# Patient Record
Sex: Female | Born: 1935 | Race: Black or African American | Hispanic: No | Smoking: Never smoker
Health system: Southern US, Community
[De-identification: ages and names within clinical notes are randomized; demographics above are authoritative.]

## PROBLEM LIST (undated history)

## (undated) DIAGNOSIS — M1990 Unspecified osteoarthritis, unspecified site: Secondary | ICD-10-CM

## (undated) DIAGNOSIS — M1611 Unilateral primary osteoarthritis, right hip: Secondary | ICD-10-CM

## (undated) DIAGNOSIS — K219 Gastro-esophageal reflux disease without esophagitis: Secondary | ICD-10-CM

## (undated) DIAGNOSIS — M1612 Unilateral primary osteoarthritis, left hip: Secondary | ICD-10-CM

## (undated) DIAGNOSIS — I2510 Atherosclerotic heart disease of native coronary artery without angina pectoris: Secondary | ICD-10-CM

## (undated) DIAGNOSIS — R351 Nocturia: Secondary | ICD-10-CM

## (undated) DIAGNOSIS — J40 Bronchitis, not specified as acute or chronic: Secondary | ICD-10-CM

## (undated) DIAGNOSIS — I6529 Occlusion and stenosis of unspecified carotid artery: Secondary | ICD-10-CM

## (undated) DIAGNOSIS — E039 Hypothyroidism, unspecified: Secondary | ICD-10-CM

## (undated) DIAGNOSIS — R058 Other specified cough: Secondary | ICD-10-CM

## (undated) DIAGNOSIS — R05 Cough: Secondary | ICD-10-CM

## (undated) DIAGNOSIS — I219 Acute myocardial infarction, unspecified: Secondary | ICD-10-CM

## (undated) DIAGNOSIS — M25473 Effusion, unspecified ankle: Secondary | ICD-10-CM

## (undated) DIAGNOSIS — Z9289 Personal history of other medical treatment: Secondary | ICD-10-CM

## (undated) DIAGNOSIS — M169 Osteoarthritis of hip, unspecified: Secondary | ICD-10-CM

## (undated) DIAGNOSIS — E7800 Pure hypercholesterolemia, unspecified: Secondary | ICD-10-CM

## (undated) DIAGNOSIS — K9189 Other postprocedural complications and disorders of digestive system: Secondary | ICD-10-CM

## (undated) DIAGNOSIS — R51 Headache: Secondary | ICD-10-CM

## (undated) DIAGNOSIS — K567 Ileus, unspecified: Secondary | ICD-10-CM

## (undated) DIAGNOSIS — I10 Essential (primary) hypertension: Secondary | ICD-10-CM

## (undated) HISTORY — PX: CORONARY ARTERY BYPASS GRAFT: SHX141

## (undated) HISTORY — PX: ABDOMINAL HYSTERECTOMY: SHX81

## (undated) HISTORY — PX: LYMPH NODE DISSECTION: SHX5087

## (undated) HISTORY — DX: Occlusion and stenosis of unspecified carotid artery: I65.29

## (undated) HISTORY — DX: Personal history of other medical treatment: Z92.89

## (undated) HISTORY — DX: Pure hypercholesterolemia, unspecified: E78.00

## (undated) HISTORY — DX: Atherosclerotic heart disease of native coronary artery without angina pectoris: I25.10

## (undated) HISTORY — DX: Essential (primary) hypertension: I10

## (undated) HISTORY — DX: Gastro-esophageal reflux disease without esophagitis: K21.9

## (undated) HISTORY — PX: BACK SURGERY: SHX140

## (undated) HISTORY — DX: Osteoarthritis of hip, unspecified: M16.9

---

## 2006-10-06 HISTORY — PX: CARDIAC CATHETERIZATION: SHX172

## 2006-10-19 ENCOUNTER — Encounter: Payer: Self-pay | Admitting: Internal Medicine

## 2006-10-19 ENCOUNTER — Inpatient Hospital Stay (HOSPITAL_COMMUNITY): Admission: EM | Admit: 2006-10-19 | Discharge: 2006-10-22 | Payer: Self-pay | Admitting: Emergency Medicine

## 2006-10-19 ENCOUNTER — Ambulatory Visit: Payer: Self-pay | Admitting: Internal Medicine

## 2006-11-24 ENCOUNTER — Encounter: Payer: Self-pay | Admitting: Cardiology

## 2006-11-24 ENCOUNTER — Ambulatory Visit: Payer: Self-pay

## 2006-11-26 ENCOUNTER — Ambulatory Visit: Payer: Self-pay | Admitting: Cardiology

## 2006-12-17 ENCOUNTER — Ambulatory Visit: Payer: Self-pay | Admitting: Cardiology

## 2006-12-30 ENCOUNTER — Ambulatory Visit: Payer: Self-pay

## 2007-03-25 ENCOUNTER — Ambulatory Visit: Payer: Self-pay | Admitting: Cardiology

## 2007-07-01 ENCOUNTER — Ambulatory Visit: Payer: Self-pay | Admitting: Cardiology

## 2007-12-10 ENCOUNTER — Ambulatory Visit: Payer: Self-pay | Admitting: Cardiology

## 2008-07-17 ENCOUNTER — Ambulatory Visit: Payer: Self-pay | Admitting: Cardiology

## 2008-07-17 LAB — CONVERTED CEMR LAB
ALT: 16 units/L (ref 0–35)
AST: 17 units/L (ref 0–37)
Albumin: 3.6 g/dL (ref 3.5–5.2)
Alkaline Phosphatase: 68 units/L (ref 39–117)
Bilirubin, Direct: 0.1 mg/dL (ref 0.0–0.3)
Cholesterol: 145 mg/dL (ref 0–200)
HDL: 43.9 mg/dL (ref >39.0)
LDL Cholesterol: 87 mg/dL (ref 0–99)
Total Bilirubin: 0.6 mg/dL (ref 0.3–1.2)
Total CHOL/HDL Ratio: 3.3
VLDL: 15 mg/dL (ref 0–40)

## 2008-08-03 ENCOUNTER — Ambulatory Visit: Payer: Self-pay

## 2008-08-03 ENCOUNTER — Encounter: Payer: Self-pay | Admitting: Cardiology

## 2008-09-17 DIAGNOSIS — I25118 Atherosclerotic heart disease of native coronary artery with other forms of angina pectoris: Secondary | ICD-10-CM

## 2008-09-17 DIAGNOSIS — I6529 Occlusion and stenosis of unspecified carotid artery: Secondary | ICD-10-CM | POA: Insufficient documentation

## 2008-09-17 DIAGNOSIS — E785 Hyperlipidemia, unspecified: Secondary | ICD-10-CM | POA: Insufficient documentation

## 2008-09-17 DIAGNOSIS — I10 Essential (primary) hypertension: Secondary | ICD-10-CM | POA: Insufficient documentation

## 2008-09-17 DIAGNOSIS — I2510 Atherosclerotic heart disease of native coronary artery without angina pectoris: Secondary | ICD-10-CM | POA: Insufficient documentation

## 2008-11-21 ENCOUNTER — Ambulatory Visit: Payer: Self-pay | Admitting: Cardiology

## 2008-11-21 LAB — CONVERTED CEMR LAB
AST: 19 units/L (ref 0–37)
Alkaline Phosphatase: 69 units/L (ref 39–117)
Bilirubin, Direct: 0.1 mg/dL (ref 0.0–0.3)
LDL Cholesterol: 53 mg/dL (ref 0–99)
Total Bilirubin: 0.6 mg/dL (ref 0.3–1.2)
Triglycerides: 89 mg/dL (ref 0–149)
VLDL: 18 mg/dL (ref 0–40)

## 2009-10-10 ENCOUNTER — Telehealth: Payer: Self-pay | Admitting: Cardiology

## 2009-10-15 ENCOUNTER — Encounter (INDEPENDENT_AMBULATORY_CARE_PROVIDER_SITE_OTHER): Payer: Self-pay | Admitting: *Deleted

## 2009-11-05 ENCOUNTER — Encounter: Payer: Self-pay | Admitting: Cardiology

## 2009-11-29 ENCOUNTER — Ambulatory Visit: Payer: Self-pay | Admitting: Cardiology

## 2009-11-29 DIAGNOSIS — R0602 Shortness of breath: Secondary | ICD-10-CM | POA: Insufficient documentation

## 2009-11-29 LAB — CONVERTED CEMR LAB
ALT: 27 units/L (ref 0–35)
AST: 24 units/L (ref 0–37)
Albumin: 3.8 g/dL (ref 3.5–5.2)
BUN: 11 mg/dL (ref 6–23)
Chloride: 109 meq/L (ref 96–112)
Cholesterol: 171 mg/dL (ref 0–200)
HDL: 57.4 mg/dL (ref >39.00)
LDL Cholesterol: 96 mg/dL (ref 0–99)
Total Bilirubin: 0.5 mg/dL (ref 0.3–1.2)

## 2009-12-17 ENCOUNTER — Telehealth (INDEPENDENT_AMBULATORY_CARE_PROVIDER_SITE_OTHER): Payer: Self-pay | Admitting: *Deleted

## 2009-12-18 ENCOUNTER — Ambulatory Visit: Payer: Self-pay | Admitting: Cardiology

## 2009-12-18 ENCOUNTER — Ambulatory Visit: Payer: Self-pay

## 2009-12-18 ENCOUNTER — Encounter (HOSPITAL_COMMUNITY): Admission: RE | Admit: 2009-12-18 | Discharge: 2010-02-06 | Payer: Self-pay | Admitting: Cardiology

## 2009-12-18 ENCOUNTER — Encounter: Payer: Self-pay | Admitting: Cardiology

## 2009-12-18 ENCOUNTER — Ambulatory Visit (HOSPITAL_COMMUNITY): Admission: RE | Admit: 2009-12-18 | Discharge: 2009-12-18 | Payer: Self-pay | Admitting: Cardiology

## 2009-12-24 ENCOUNTER — Ambulatory Visit: Payer: Self-pay | Admitting: Cardiology

## 2009-12-24 DIAGNOSIS — I5032 Chronic diastolic (congestive) heart failure: Secondary | ICD-10-CM | POA: Insufficient documentation

## 2009-12-24 LAB — CONVERTED CEMR LAB
BUN: 11 mg/dL (ref 6–23)
GFR calc non Af Amer: 105.15 mL/min (ref >60)
Glucose, Bld: 122 mg/dL — ABNORMAL HIGH (ref 70–99)
Sodium: 141 meq/L (ref 135–145)

## 2009-12-25 LAB — CONVERTED CEMR LAB
CO2: 29 meq/L (ref 19–32)
Calcium: 8.9 mg/dL (ref 8.4–10.5)
GFR calc non Af Amer: 90.13 mL/min (ref >60)
Glucose, Bld: 111 mg/dL — ABNORMAL HIGH (ref 70–99)
Sodium: 142 meq/L (ref 135–145)

## 2010-01-24 ENCOUNTER — Ambulatory Visit: Payer: Self-pay | Admitting: Cardiology

## 2010-01-29 LAB — CONVERTED CEMR LAB
ALT: 14 units/L (ref 0–35)
AST: 18 units/L (ref 0–37)
Albumin: 3.3 g/dL — ABNORMAL LOW (ref 3.5–5.2)
Alkaline Phosphatase: 65 units/L (ref 39–117)
Bilirubin, Direct: 0 mg/dL (ref 0.0–0.3)
HDL: 42.1 mg/dL (ref >39.00)
Total Bilirubin: 0 mg/dL — ABNORMAL LOW (ref 0.3–1.2)
VLDL: 20.8 mg/dL (ref 0.0–40.0)

## 2010-03-25 ENCOUNTER — Ambulatory Visit: Payer: Self-pay | Admitting: Cardiology

## 2010-03-29 LAB — CONVERTED CEMR LAB
BUN: 13 mg/dL (ref 6–23)
CO2: 30 meq/L (ref 19–32)
Calcium: 9.1 mg/dL (ref 8.4–10.5)
Chloride: 109 meq/L (ref 96–112)
Creatinine, Ser: 0.7 mg/dL (ref 0.4–1.2)
Potassium: 4.5 meq/L (ref 3.5–5.1)
Sodium: 143 meq/L (ref 135–145)

## 2010-07-05 ENCOUNTER — Telehealth: Payer: Self-pay | Admitting: Cardiology

## 2010-07-05 ENCOUNTER — Encounter: Payer: Self-pay | Admitting: Cardiology

## 2010-07-29 ENCOUNTER — Ambulatory Visit: Payer: Self-pay | Admitting: Cardiology

## 2010-07-31 ENCOUNTER — Encounter: Payer: Self-pay | Admitting: Cardiology

## 2010-07-31 LAB — CONVERTED CEMR LAB
AST: 22 units/L (ref 0–37)
Alkaline Phosphatase: 84 units/L (ref 39–117)
BUN: 14 mg/dL (ref 6–23)
Calcium: 9.2 mg/dL (ref 8.4–10.5)
HDL: 40.7 mg/dL (ref >39.00)
Sodium: 136 meq/L (ref 135–145)
Total CHOL/HDL Ratio: 3
Total Protein: 7.6 g/dL (ref 6.0–8.3)

## 2010-11-05 NOTE — Progress Notes (Signed)
Summary: pt needs note faxed to social services   Phone Note Call from Patient Call back at [phone]   Caller: Patient Reason for Call: Talk to Nurse, Talk to Doctor Summary of Call: pt needs a note faxed to [facility]  the social services office telling them she has an appt on Monday to see provider fax# is [phone]  Follow-up for Phone Call        NOTE WAS FAXED AT PT'S REQUEST  TO NAMED INDIVIDUAL IN MESSAGE. Follow-up by: Scherrie Bateman, LPN,  July 05, 2010 2:03 PM

## 2010-11-05 NOTE — Assessment & Plan Note (Signed)
Summary: f/u echo and myoview done 12-18-09  Medications Added NORVASC 10 MG TABS (AMLODIPINE BESYLATE) one tablet daily TYLENOL ARTHRITIS PAIN 650 MG CR-TABS (ACETAMINOPHEN) one tablet every 8 hours as needed for pain TRAMADOL HCL 50 MG TABS (TRAMADOL HCL) one tablet twice a day as needed for pain      Allergies Added: NKDA  Primary Provider:  Dr. Illene Regulus  CC:  follow up echo and myoview.  Pt states she is feeling well.  History of Present Illness: 77 yo with history of CAD s/p PCI returns for cardiology followup today.  She initially had depressed LV EF after her MI but this rose back to normal range on last echo in 2009.  At last appointment, she reported exertional dyspnea that seems to have been fairly stable for the last year.  She was able to walk a little over 100 yards then had to stop because of shortness of breath.  No orthopnea or PND.  She had an episode about 2 months ago of severe chest pain for about 10-15 minutes that began while she was mopping her house.  She has not had any significant chest pain before or since that time.  She looked volume overloaded at last appointment.  I increased her lasix to 40 mg daily.  She had an echo done showing preserved LV systolic function and mild diastolic dysfunction.  ETT-myoview was done with a hypertensive BP response on the treadmill but no evidence for ischemia or infarction on images.  Since increasing Lasix, she has been breathing better.  She can walk up to 1/2 mile without stopping and does not have orthopnea.  No chest pain.  Labs (2/10): LDL 53, HDL 44 Labs (2/11): BNP 261, creatinine 0.7, K 3.9, LDL 96, HDL 57 Labs (3/11): BNP 48, K 3.7, creatinine 0.8  Current Medications (verified): 1)  Plavix 75 Mg Tabs (Clopidogrel Bisulfate) .... Take One Tablet By Mouth Daily 2)  Hydrochlorothiazide 25 Mg Tabs (Hydrochlorothiazide) .... Take One Tablet Once Daily 3)  Levoxyl 100 Mcg Tabs (Levothyroxine Sodium) .... Take One Tablet  Once Daily 4)  Coreg 25 Mg Tabs (Carvedilol) .... Take One Tablet Two Times A Day 5)  Enalapril Maleate 20 Mg Tabs (Enalapril Maleate) .... Take One Tablet Two Times A Day 6)  Crestor 40 Mg Tabs (Rosuvastatin Calcium) .... One Tablet Daily 7)  Klor-Con 8 Meq Cr-Tabs (Potassium Chloride) .... Take One Tablet Twice Daily 8)  Aspirin 81 Mg Tabs (Aspirin) .... Once Daily 9)  Furosemide 40 Mg Tabs (Furosemide) .... One Tablet Daily 10)  Norvasc 5 Mg Tabs (Amlodipine Besylate) .... One Tablet Daily 11)  Naprosyn 250 Mg Tabs (Naproxen) .... As Needed For Pain  Allergies (verified): No Known Drug Allergies  Past History:  Past Medical History: 1. Coronary artery disease.  The patient had an acute anterior MI in March 2008.  She had 90% LAD stenosis with thrombus.  She did have 2.75 x 12 Taxus drug-eluting stent placed in the proximal LAD.  She did also have 80% first obtuse marginal stenosis, 70% distal circumflex stenosis and 50% proximal RCA stenosis.  Her EF was initially 30-35% at the time of her MI; however, it rose after primary PCI to 60%.  ETT-myoview (3/11): EF 68%, hypertensive BP response, normal perfusion at rest and stress (negative study).  2. Hypertension. 3. Hypercholesterolemia. 4. Gastroesophageal reflux disease. 5. Mild carotid stenosis less than 50% bilaterally.  6. Echo (3/11): EF 60-65%, mild diastolic dysfunction, mild MR, normal RV.  Family History: Reviewed history from 09/17/2008 and no changes required. Noncontributory.  Social History: Reviewed history from 09/17/2008 and no changes required. The patient lives in [place].  She does not smoke or drink alcohol.   Review of Systems       All systems reviewed and negative except as per HPI.   Vital Signs:  Patient profile:   77 year old female Height:      59 inches Weight:      149 pounds BMI:     30.20 Pulse rate:   53 / minute Pulse rhythm:   regular BP sitting:   148 / 70  (left arm) Cuff size:    regular  Vitals Entered By: Judithe Modest CMA (December 24, 2009 10:06 AM)  Physical Exam  General:  Well developed, well nourished, in no acute distress. Neck:  Neck supple, no JVD. No masses, thyromegaly or abnormal cervical nodes. Lungs:  Clear bilaterally to auscultation and percussion. Heart:  Non-displaced PMI, chest non-tender; regular rate and rhythm, S1, S2 without rubs or gallops. Soft 1/6 systolic ejection-type murmur.  Carotid upstroke normal, no bruit. Pedals normal pulses. No edema.  Abdomen:  Bowel sounds positive; abdomen soft and non-tender without masses, organomegaly, or hernias noted. No hepatosplenomegaly. Extremities:  No clubbing or cyanosis. Neurologic:  Alert and oriented x 3. Psych:  Normal affect.   Impression & Recommendations:  Problem # 1:  SHORTNESS OF BREATH (ICD-786.05) Patient is feeling better on increased Lasix and now appears euvolemic.  BNP down from 261 to 48.  Echo showed preserved LV systolic function (diastolic CHF).  She is able to walk up to 1/2 mile now without stopping.  I will have her continue Lasix 40 mg daily and have counselled her about cutting back on the salt in her diet.   Problem # 2:  CAD, NATIVE VESSEL (ICD-414.01) Myoview showed no evidence for ischemia or infarction.  Continue current meds including Coreg, ASA, Plavix, ACEI, and statin.   Problem # 3:  HYPERTENSION, UNSPECIFIED (ICD-401.9) I would like her to avoid Naprosyn if possible as it can both raise BP and lead to volume retention.  She should use Tylenol or tramadol instead.   Problem # 4:  HYPERLIPIDEMIA-MIXED (ICD-272.4) Patient was started on Crestor 40 with goal to get LDL < 70.  Will repeat lipids/LFTs in April.    Other Orders: TLB-BNP (B-Natriuretic Peptide) (83880-BNPR) TLB-BMP (Basic Metabolic Panel-BMET) (80048-METABOL)  Patient Instructions: 1)  Your physician has recommended you make the following change in your medication:  2)  Increase Amlodipine  to  10mg  daily 3)  Stop Naprosyn 4)  Use Tylenol 650mg  every 8 hours as needed for arthritis pain 5)  If Tylenol does not relieve your pain you can use Tramadol 50mg  twice a day for pain--there is a presciption for this at [facility]--DO NOT TAKE  Tylenol and Tramadol 6)  Your physician recommends that you have lab work today--BMP/BNP 428.32 7)  Your physician recommends that you return for a FASTING lipid profile/liver profile THE END OF APRIL 428.32 8)  Your physician recommends that you schedule a follow-up appointment in: 3 months with Dr Marca Ancona Prescriptions: TRAMADOL HCL 50 MG TABS (TRAMADOL HCL) one tablet twice a day as needed for pain  #60 x 0   Entered by:   Katina Dung, RN, BSN   Authorized by:   Marca Ancona, MD   Signed by:   Katina Dung, RN, BSN on 12/24/2009   Method used:  Electronically to        [facility] (retail)       [address]       [place], [place]  [address]       Ph: [phone]       Fax: [phone]   RxID:   [id number] NORVASC 10 MG TABS (AMLODIPINE BESYLATE) one tablet daily  #30 x 6   Entered by:   Katina Dung, RN, BSN   Authorized by:   Marca Ancona, MD   Signed by:   Katina Dung, RN, BSN on 12/24/2009   Method used:   Electronically to        [facility] (retail)       [address]       [place], [place]  [address]       Ph: [phone]       Fax: [phone]   RxID:   [id number]

## 2010-11-05 NOTE — Miscellaneous (Signed)
  Clinical Lists Changes  Observations: Added new observation of ECHOINTERP: --------------------------------------------------------------------   Study Conclusions   Left ventricle: The cavity size was normal. There was mild   concentric hypertrophy. Systolic function was mildly reduced. The   estimated ejection fraction was in the range of 45% to 50%. Diffuse   hypokinesis. Doppler parameters are consistent with abnormal left   ventricular relaxation (grade 1 diastolic dysfunction).   Echocardiography. M-mode, complete 2D, spectral Doppler, and color   Doppler. Height: Height: 160cm. Height: 63in. Weight: Weight:   174.6kg. Weight: 384.2lb. Body mass index: BMI: 68.2kg/m^2. Body   surface area: BSA: 2.24m^2. Patient status: Outpatient. Location:   [facility], [facility] and exercise.   Prepared and Electronically Authenticated by    Olga Millers, MD, [facility]   2010-08-16T15:40:05.290 (05/21/2009 10:35)      Echocardiogram  Procedure date:  05/21/2009  Findings:      --------------------------------------------------------------------   Study Conclusions   Left ventricle: The cavity size was normal. There was mild   concentric hypertrophy. Systolic function was mildly reduced. The   estimated ejection fraction was in the range of 45% to 50%. Diffuse   hypokinesis. Doppler parameters are consistent with abnormal left   ventricular relaxation (grade 1 diastolic dysfunction).   Echocardiography. M-mode, complete 2D, spectral Doppler, and color   Doppler. Height: Height: 160cm. Height: 63in. Weight: Weight:   174.6kg. Weight: 384.2lb. Body mass index: BMI: 68.2kg/m^2. Body   surface area: BSA: 2.36m^2. Patient status: Outpatient. Location:   Psychologist, sport and exercise.   Prepared and Electronically Authenticated by    Olga Millers, MD, [facility]   2010-08-16T15:40:05.290

## 2010-11-05 NOTE — Assessment & Plan Note (Signed)
Summary: f1y/jss  Medications Added HYDROCHLOROTHIAZIDE 25 MG TABS (HYDROCHLOROTHIAZIDE) take one tablet once daily LEVOXYL 100 MCG TABS (LEVOTHYROXINE SODIUM) take one tablet once daily COREG 25 MG TABS (CARVEDILOL) take one tablet two times a day ENALAPRIL MALEATE 20 MG TABS (ENALAPRIL MALEATE) take one tablet two times a day LIPITOR 80 MG TABS (ATORVASTATIN CALCIUM) take one tablet once daily KLOR-CON 8 MEQ CR-TABS (POTASSIUM CHLORIDE) take one tablet twice daily ASPIRIN 81 MG TABS (ASPIRIN) once daily FUROSEMIDE 40 MG TABS (FUROSEMIDE) one tablet daily NORVASC 5 MG TABS (AMLODIPINE BESYLATE) one tablet daily      Allergies Added: NKDA  Primary Provider:  Dr. Illene Regulus  CC:  follow up 1 year. Pt feeling SOB lately.  History of Present Illness: 77 yo with history of CAD s/p PCI returns for cardiology followup today.  She initially had depressed LV EF after her MI but this rose back to normal range on last echo in 2009.  She was lost to followup for the last year and a half.  She reports exertional dyspnea that seems to have been fairly stable for the last year.  She can walk a little over 100 yards then has to stop because of shortness of breath.  No orthopnea or PND.  She had an episode about 2 months ago of severe chest pain for about 10-15 minutes that began while she was mopping her house.  She had not had any significant chest pain before or since that time.  BP at home has been in the 140s-150s systolic.  Initial BP here was 136/88 but was 180/92 when I rechecked it manually.  She took all her meds today.    labs (2/10): LDL 53, HDL 44  Current Medications (verified): 1)  Plavix 75 Mg Tabs (Clopidogrel Bisulfate) .... Take One Tablet By Mouth Daily 2)  Hydrochlorothiazide 25 Mg Tabs (Hydrochlorothiazide) .... Take One Tablet Once Daily 3)  Levoxyl 100 Mcg Tabs (Levothyroxine Sodium) .... Take One Tablet Once Daily 4)  Coreg 25 Mg Tabs (Carvedilol) .... Take One Tablet Two Times A  Day 5)  Enalapril Maleate 20 Mg Tabs (Enalapril Maleate) .... Take One Tablet Two Times A Day 6)  Lipitor 80 Mg Tabs (Atorvastatin Calcium) .... Take One Tablet Once Daily 7)  Klor-Con 8 Meq Cr-Tabs (Potassium Chloride) .... Take One Tablet Twice Daily 8)  Aspirin 81 Mg Tabs (Aspirin) .... Once Daily 9)  Furosemide 40 Mg Tabs (Furosemide) .... One Tablet Daily 10)  Norvasc 5 Mg Tabs (Amlodipine Besylate) .... One Tablet Daily  Allergies (verified): No Known Drug Allergies  Past History:  Past Medical History: 1. Coronary artery disease.  The patient had an acute anterior MI in March 2008.  She had 90% LAD stenosis with thrombus.  She did have 2.75 x 12 Taxus drug-eluting stent placed in the proximal LAD.  She did also have 80% first obtuse marginal stenosis, 70% distal circumflex stenosis and 50% proximal RCA stenosis.  Her EF was initially 30-35% at the time of her MI; however, it rose after primary PCI to 60%. 2. Hypertension. 3. Hypercholesterolemia. 4. Gastroesophageal reflux disease. 5. Mild carotid stenosis less than 50% bilaterally.  6. Echo (10/09): EF 60-65%, no rWMAs, normal RV size/fxn.  Family History: Reviewed history from 09/17/2008 and no changes required. Noncontributory.  Social History: Reviewed history from 09/17/2008 and no changes required. The patient lives in [place].  She does not smoke or drink alcohol.   Review of Systems  All systems reviewed and negative except as per HPI.   Vital Signs:  Patient profile:   77 year old female Height:      59 inches Weight:      152 pounds BMI:     30.81 Pulse rate:   59 / minute Pulse rhythm:   regular BP sitting:   136 / 88  (left arm) Cuff size:   regular  Vitals Entered By: Judithe Modest CMA (November 29, 2009 9:29 AM)  Physical Exam  General:  Well developed, well nourished, in no acute distress. Neck:  Neck supple, JVP 9-10 cm. No masses, thyromegaly or abnormal cervical nodes. Lungs:   Clear bilaterally to auscultation and percussion. Heart:  Non-displaced PMI, chest non-tender; regular rate and rhythm, S1, S2 without rubs or gallops. Soft 1/6 systolic ejection-type murmur.  Carotid upstroke normal, no bruit. Pedals normal pulses. Trace ankle edema.   Abdomen:  Bowel sounds positive; abdomen soft and non-tender without masses, organomegaly, or hernias noted. No hepatosplenomegaly. Extremities:  No clubbing or cyanosis. Neurologic:  Alert and oriented x 3. Psych:  Normal affect.   Impression & Recommendations:  Problem # 1:  SHORTNESS OF BREATH (ICD-786.05) Patient has NYHA class IIIa symptoms.  She says they have been stable for at least a year.  EF was normal on last echo in 2009.  She is volume overloaded on exam with increased JVP.  I will repeat an echo to reassess LV systolic function and will check BNP.  She will continue Coreg and enalapril.   I will increase Lasix to 40 mg daily.    Problem # 2:  CAD, NATIVE VESSEL (ICD-414.01) Patient had an episode of severe exertional chest pain a couple of months ago but nothing since.  Given her exertional dyspnea and signs of CHF/volume overload on exam, I will have her do an ETT-myoview to assess for any evidence of progressive coronary disease.  She will continue ASA and Plavix as well as statin.  Check lipids/LFTs today.    Problem # 3:  HYPERTENSION, UNSPECIFIED (ICD-401.9) BP is too high.  Start Norvasc 5 mg daily today.  Will reassess when she follows up in 3-4 weeks.   Other Orders: EKG w/ Interpretation (93000) Echocardiogram (Echo) Nuclear Stress Test (Nuc Stress Test) TLB-BNP (B-Natriuretic Peptide) (83880-BNPR) TLB-BMP (Basic Metabolic Panel-BMET) (80048-METABOL) TLB-Lipid Panel (80061-LIPID) TLB-Hepatic/Liver Function Pnl (80076-HEPATIC)  Patient Instructions: 1)  Your physician has recommended you make the following change in your medication:  2)  Increase Lasix to 40mg  daily 3)  Increase KCL(potassium) to  twice a day 4)  Start Norvasc 5mg  daily 5)  Your physician recommends that you have FASTING lipid profile/liver profile/BMP/BNP today  411.01 786.50 6)  Take and record your blood pressure--bring the readings when you come back to see Dr Shirlee Latch in 2 weeks. 7)  Your physician has requested that you have an echocardiogram.  Echocardiography is a painless test that uses sound waves to create images of your heart. It provides your doctor with information about the size and shape of your heart and how well your heart's chambers and valves are working.  This procedure takes approximately one hour. There are no restrictions for this procedure. 8)  Your physician has requested that you have an exercise stress myoview.  For further information please visit [email or web address]  Please follow instruction sheet, as given. 9)  Your physician recommends that you schedule a follow-up appointment in: 2-3 weeks with Dr Shirlee Latch Prescriptions: NORVASC 5 MG TABS (AMLODIPINE BESYLATE)  one tablet daily  #30 x 6   Entered by:   Katina Dung, RN, BSN   Authorized by:   Marca Ancona, MD   Signed by:   Katina Dung, RN, BSN on 11/29/2009   Method used:   Electronically to        [facility] (retail)       [address]       [place], [place]  [address]       Ph: [phone]       Fax: [phone]   RxID:   [id number] KLOR-CON 8 MEQ CR-TABS (POTASSIUM CHLORIDE) take one tablet twice daily  #60 x 6   Entered by:   Katina Dung, RN, BSN   Authorized by:   Marca Ancona, MD   Signed by:   Katina Dung, RN, BSN on 11/29/2009   Method used:   Electronically to        [facility] (retail)       [address]       [place], [place]  [address]       Ph: [phone]       Fax: [phone]   RxID:   [id number] FUROSEMIDE 40 MG TABS (FUROSEMIDE) one tablet daily  #30 x 6   Entered by:   Katina Dung, RN, BSN   Authorized by:   Marca Ancona, MD   Signed by:   Katina Dung, RN, BSN on 11/29/2009   Method used:   Electronically to        [facility] (retail)       [address]       [place], [place]  [address]       Ph: [phone]       Fax: [phone]   RxID:   [id number]

## 2010-11-05 NOTE — Miscellaneous (Signed)
Summary: update med  Clinical Lists Changes  Medications: Added new medication of PLAVIX 75 MG TABS (CLOPIDOGREL BISULFATE) Take one tablet by mouth daily 

## 2010-11-05 NOTE — Assessment & Plan Note (Signed)
Summary: per check out/sf      Allergies Added: NKDA  Visit Type:  Follow-up Primary Provider:  Dr. Illene Regulus  CC:  Occasional shortness of breath.  History of Present Illness: 77 yo with history of CAD s/p PCI returns for cardiology followup today.  She initially had depressed LV EF after her MI but this rose back to normal range on last echo in 2009.  At a recent appointment, she reported exertional dyspnea that seems to have been fairly stable for the year prior.  She was able to walk a little over 100 yards then had to stop because of shortness of breath.  No orthopnea or PND.   She looked volume overloaded at that appointment, so I increased her lasix to 40 mg daily.  She had an echo done showing preserved LV systolic function and mild diastolic dysfunction.  ETT-myoview was done with a hypertensive BP response on the treadmill but no evidence for ischemia or infarction on images.  Since increasing Lasix, she has been breathing better.  She can walk up to 1/2 mile without stopping and does not have orthopnea.  No chest pain.  She can walk up a flight of steps.  BP is now under good control with SBP < 140 at home.  She is occasionally dizzy when she first stands up in the morning but not severely.  No syncope or falls.  Weight has risen some but she also says she has been eating more.    Labs (2/10): LDL 53, HDL 44 Labs (2/11): BNP 261, creatinine 0.7, K 3.9, LDL 96, HDL 57 Labs (3/11): BNP 48, K 3.7, creatinine 0.8 Labs (4/11): LDL 61, HDL 42, LFTs normal Labs (5/40): K 4.5, creatinine 0.7, BNP 40  ECG: NSR, abnormal P wave axis, nonspecific ST-T changes  Problems Prior to Update: 1)  Chronic Diastolic Heart Failure  (ICD-428.32) 2)  Shortness of Breath  (ICD-786.05) 3)  Carotid Artery Stenosis  (ICD-433.10) 4)  Hypertension, Unspecified  (ICD-401.9) 5)  Hyperlipidemia-mixed  (ICD-272.4) 6)  Cad, Native Vessel  (ICD-414.01)  Current Medications (verified): 1)  Plavix 75 Mg Tabs  (Clopidogrel Bisulfate) .... Take One Tablet By Mouth Daily 2)  Hydrochlorothiazide 25 Mg Tabs (Hydrochlorothiazide) .... Take One Tablet Once Daily 3)  Levoxyl 100 Mcg Tabs (Levothyroxine Sodium) .... Take One Tablet Once Daily 4)  Coreg 25 Mg Tabs (Carvedilol) .... Take One Tablet Two Times A Day 5)  Enalapril Maleate 20 Mg Tabs (Enalapril Maleate) .... Take One Tablet Two Times A Day 6)  Crestor 40 Mg Tabs (Rosuvastatin Calcium) .... One Tablet Daily 7)  Klor-Con 8 Meq Cr-Tabs (Potassium Chloride) .... Take One Tablet Twice Daily 8)  Aspirin 81 Mg Tabs (Aspirin) .... Once Daily 9)  Furosemide 40 Mg Tabs (Furosemide) .... One Tablet Daily 10)  Norvasc 10 Mg Tabs (Amlodipine Besylate) .... One Tablet Daily 11)  Tylenol Arthritis Pain 650 Mg Cr-Tabs (Acetaminophen) .... One Tablet Every 8 Hours As Needed For Pain 12)  Tramadol Hcl 50 Mg Tabs (Tramadol Hcl) .... One Tablet Twice A Day As Needed For Pain 13)  Naproxen 250 Mg Tabs (Naproxen) .... Take One Tablet Two Times A Day As Needed For Pain  Allergies (verified): No Known Drug Allergies  Past History:  Past Medical History: Reviewed history from 12/24/2009 and no changes required. 1. Coronary artery disease.  The patient had an acute anterior MI in March 2008.  She had 90% LAD stenosis with thrombus.  She did have 2.75 x 12  Taxus drug-eluting stent placed in the proximal LAD.  She did also have 80% first obtuse marginal stenosis, 70% distal circumflex stenosis and 50% proximal RCA stenosis.  Her EF was initially 30-35% at the time of her MI; however, it rose after primary PCI to 60%.  ETT-myoview (3/11): EF 68%, hypertensive BP response, normal perfusion at rest and stress (negative study).  2. Hypertension. 3. Hypercholesterolemia. 4. Gastroesophageal reflux disease. 5. Mild carotid stenosis less than 50% bilaterally.  6. Echo (3/11): EF 60-65%, mild diastolic dysfunction, mild MR, normal RV.   Family History: Reviewed history from  09/17/2008 and no changes required. Noncontributory.  Social History: The patient lives alone in an apartment in [place].  She does not smoke or drink alcohol.   Review of Systems       All systems reviewed and negative except as per HPI.   Vital Signs:  Patient profile:   77 year old female Height:      59 inches Weight:      156.75 pounds BMI:     31.77 Pulse rate:   73 / minute BP sitting:   138 / 72  (left arm) Cuff size:   regular  Vitals Entered By: Caralee Ates CMA (July 29, 2010 10:54 AM)  Physical Exam  General:  Well developed, well nourished, in no acute distress. Neck:  Neck supple, no JVD. No masses, thyromegaly or abnormal cervical nodes. Lungs:  Clear bilaterally to auscultation and percussion. Heart:  Non-displaced PMI, chest non-tender; regular rate and rhythm, S1, S2.  +S4. 2/6 early systolic ejection-type murmur.  Carotid upstroke normal, no bruit. Pedals normal pulses. No edema.  Abdomen:  Bowel sounds positive; abdomen soft and non-tender without masses, organomegaly, or hernias noted. No hepatosplenomegaly. Extremities:  No clubbing or cyanosis. Neurologic:  Alert and oriented x 3. Psych:  Normal affect.   Impression & Recommendations:  Problem # 1:  CHRONIC DIASTOLIC HEART FAILURE (ICD-428.32) Patient appears euvolemic and symptomatically stable, likely NYHA class II.  BP is now under control which should help.  Continue current Lasix dosing.  Will check BMET and BNP today.   Problem # 2:  CAD, NATIVE VESSEL (ICD-414.01) Myoview in 3/11 showed no evidence for ischemia or infarction.  No chest pain.  Continue current meds including Coreg, ASA, Plavix, ACEI, and statin.   Problem # 3:  HYPERLIPIDEMIA-MIXED (ICD-272.4) Needs lipids/LFTs (goal LDL < 70).   I encouraged the patient to try to get more exercise.  She could walk around her apartment complex.   Other Orders: TLB-BMP (Basic Metabolic Panel-BMET) (80048-METABOL) TLB-Hepatic/Liver  Function Pnl (80076-HEPATIC) TLB-Lipid Panel (80061-LIPID)  Patient Instructions: 1)  Lab today--BMP/Lipid profile/Liver profile today  414.01 786.09 2)  Walk 20-30 minutes daily. 3)  Your physician wants you to follow-up in: 6 months with Dr Shirlee Latch.  You will receive a reminder letter in the mail two months in advance. If you don't receive a letter, please call our office to schedule the follow-up appointment.

## 2010-11-05 NOTE — Progress Notes (Signed)
Summary: Nuclear Pre-Procedure  Phone Note Outgoing Call   Call placed by: Milana Na, EMT-P,  December 17, 2009 12:39 PM Summary of Call: Reviewed information on Myoview Information Sheet (see scanned document for further details).  [name] [name] spoke with the patient, and gave her instructions.     Nuclear Med Background Indications for Stress Test: Evaluation for Ischemia, Stent Patency, PTCA Patency   History: Echo, Heart Catheterization, Myocardial Infarction, Stents  History Comments: 03/08 MI-Heart Cath-AWMI- EF 30-35%  99% with Thrombus 80% OM 70% CFX 50% RCA  STENTS LAD 10/09 ECHO EF 60-65%  Symptoms: Chest Pain with Exertion, DOE    Nuclear Pre-Procedure Cardiac Risk Factors: Carotid Disease, Hypertension, Lipids Height (in): 59  Nuclear Med Study Referring MD:  D.Mclean

## 2010-11-05 NOTE — Letter (Signed)
Summary: Custom - Lipid  [facility], Main Office  [address]   [place], [place] [address]   Phone: [phone]  Fax: [phone]     July 31, 2010 MRN: [id number]   [name] [address] [place], [place]  [address]   Dear Ms. Keener,   Dr.  Shirlee Latch has reviewed  your cholesterol results.  They are as follows:     Total Cholesterol:    130 (Desirable: less than 200)       HDL  Cholesterol:     40.70  (Desirable: greater than 40 for men and 50 for women)       LDL Cholesterol:       69  (Desirable: less than 100 for low risk and less than 70 for moderate to high risk)       Triglycerides:       101.0  (Desirable: less than 150)  Cholesterol excellent.   Call our office at the number listed above if you have any questions.  Lowering your LDL cholesterol is important, but it is only one of a large number of "risk factors" that may indicate that you are at risk for heart disease, stroke or other complications of hardening of the arteries.  Other risk factors include:   A.  Cigarette Smoking* B.  High Blood Pressure* C.  Obesity* D.   Low HDL Cholesterol (see yours above)* E.   Diabetes Mellitus (higher risk if your is uncontrolled) F.  Family history of premature heart disease G.  Previous history of stroke or cardiovascular disease    *These are risk factors YOU HAVE CONTROL OVER.  For more information, visit .  There is now evidence that lowering the TOTAL CHOLESTEROL AND LDL CHOLESTEROL can reduce the risk of heart disease.  The American Heart Association recommends the following guidelines for the treatment of elevated cholesterol:  1.  If there is now current heart disease and less than two risk factors, TOTAL CHOLESTEROL should be less than 200 and LDL CHOLESTEROL should be less than 100. 2.  If there is current heart disease or two or more risk factors, TOTAL CHOLESTEROL should be less than 200 and LDL CHOLESTEROL should be less than  70.  A diet low in cholesterol, saturated fat, and calories is the cornerstone of treatment for elevated cholesterol.  Cessation of smoking and exercise are also important in the management of elevated cholesterol and preventing vascular disease.  Studies have shown that 30 to 60 minutes of physical activity most days can help lower blood pressure, lower cholesterol, and keep your weight at a healthy level.  Drug therapy is used when cholesterol levels do not respond to therapeutic lifestyle changes (smoking cessation, diet, and exercise) and remains unacceptably high.  If medication is started, it is important to have you levels checked periodically to evaluate the need for further treatment options.  Thank you,    Phineas Real

## 2010-11-05 NOTE — Letter (Signed)
Summary: Generic Letter  [facility], Main Office  [address]   [place], [place] [address]   Phone: [phone]  Fax: [phone]    07/05/2010  [facility] [address] [place], [place]  [address]  Bethann Humble NAMED PT HAS FOLLOW UP APPT  WITH DR [facility] ON 07/08/10 @2 :15 PM.       Sincerely,  DR DALTON MCLEAN/ Scherrie Bateman, LPN

## 2010-11-05 NOTE — Progress Notes (Signed)
Summary: speak to nurse   Phone Note Call from Patient Call back at Home Phone [phone]   Caller: Patient Reason for Call: Talk to Nurse Summary of Call: request to speak to nurse Initial call taken by: Migdalia Dk,  October 10, 2009 2:19 PM  Follow-up for Phone Call        pt requesting appt with Dr Almon Hercules states she is not having any problems just time for follow-up

## 2010-11-05 NOTE — Assessment & Plan Note (Signed)
Summary: Cardiology Nuclear Study  Nuclear Med Background Indications for Stress Test: Evaluation for Ischemia, Stent Patency, PTCA Patency   History: Echo, Heart Catheterization, Myocardial Infarction, Stents  History Comments: 03/08 MI-Heart Cath-AWMI- EF 30-35%  99% with Thrombus 80% OM 70% CFX 50% RCA  STENTS LAD 10/09 ECHO EF 60-65%  Symptoms: Chest Pain with Exertion, DOE    Nuclear Pre-Procedure Cardiac Risk Factors: Carotid Disease, Hypertension, Lipids Caffeine/Decaff Intake: None NPO After: 8:00 PM Lungs: clear IV 0.9% NS with Angio Cath: 22g     IV Site: (R) AC IV Started by: Irean Hong RN Chest Size (in) 36     Cup Size C     Height (in): 59 Weight (lb): 147 BMI: 29.80  Nuclear Med Study 1 or 2 day study:  1 day     Stress Test Type:  Stress Reading MD:  Marca Ancona, MD     Referring MD:  D.Dangelo Guzzetta Resting Radionuclide:  Technetium 50m Tetrofosmin     Resting Radionuclide Dose:  11.0 mCi  Stress Radionuclide:  Technetium 99m Tetrofosmin     Stress Radionuclide Dose:  33.0 mCi   Stress Protocol Exercise Time (min):  5:31 min     Max HR:  127 bpm     Predicted Max HR:  146 bpm  Max Systolic BP: 191 mm Hg     Percent Max HR:  86.99 %     METS: 7.0 Rate Pressure Product:  04540    Stress Test Technologist:  Milana Na EMT-P     Nuclear Technologist:  Domenic Polite CNMT  Rest Procedure  Myocardial perfusion imaging was performed at rest 45 minutes following the intravenous administration of Myoview Technetium 39m Tetrofosmin.  Stress Procedure  The patient received IV Lexiscan 0.4 mg over 15-seconds.  Myoview injected at 30-seconds.  There were no significant changes with infusion.  Quantitative spect images were obtained after a 45 minute delay.  QPS Raw Data Images:  There is a breast shadow that accounts for the anterior attenuation. Stress Images:  NI: Uniform and normal uptake of tracer in all myocardial segments. Rest Images:  Normal  homogeneous uptake in all areas of the myocardium. Subtraction (SDS):  There is no evidence of scar or ischemia. Transient Ischemic Dilatation:  1.09  (Normal <1.22)  Lung/Heart Ratio:  .36  (Normal <0.45)  Quantitative Gated Spect Images QGS EDV:  59 ml QGS ESV:  19 ml QGS EF:  68 % QGS cine images:  Normal wall motion.    Overall Impression  Exercise Capacity: Fair exercise capacity. BP Response: Hypertensive blood pressure response. Clinical Symptoms: Stopped due to fatigue, no chest pain.  ECG Impression: Baseline abnormal P wave axis.  No ischemic ST changes.  Overall Impression: Normal stress nuclear study.  Appended Document: Cardiology Nuclear Study appt 12-24-09

## 2010-11-05 NOTE — Assessment & Plan Note (Signed)
Summary: 3 MONTH ROV.SL  Medications Added NAPROXEN 250 MG TABS (NAPROXEN) take one tablet two times a day as needed for pain      Allergies Added: NKDA  Primary Provider:  Dr. Illene Regulus  CC:  rov/3 month. Pt states she feels pretty good.  Pt has no cardiac complaints.  .  History of Present Illness: 77 yo with history of CAD s/p PCI returns for cardiology followup today.  She initially had depressed LV EF after her MI but this rose back to normal range on last echo in 2009.  At a recent appointment, she reported exertional dyspnea that seems to have been fairly stable for the year prior.  She was able to walk a little over 100 yards then had to stop because of shortness of breath.  No orthopnea or PND.   She looked volume overloaded at that appointment, so I increased her lasix to 40 mg daily.  She had an echo done showing preserved LV systolic function and mild diastolic dysfunction.  ETT-myoview was done with a hypertensive BP response on the treadmill but no evidence for ischemia or infarction on images.  Since increasing Lasix, she has been breathing better.  She can walk up to 1/2 mile without stopping and does not have orthopnea.  No chest pain.  She can walk up a flight of steps.  BP is now under good control with SBP < 140 at home.  Weight is stable at 149 lbs.   Labs (2/10): LDL 53, HDL 44 Labs (2/11): BNP 261, creatinine 0.7, K 3.9, LDL 96, HDL 57 Labs (3/11): BNP 48, K 3.7, creatinine 0.8 Labs (4/11): LDL 61, HDL 42, LFTs normal  Current Medications (verified): 1)  Plavix 75 Mg Tabs (Clopidogrel Bisulfate) .... Take One Tablet By Mouth Daily 2)  Hydrochlorothiazide 25 Mg Tabs (Hydrochlorothiazide) .... Take One Tablet Once Daily 3)  Levoxyl 100 Mcg Tabs (Levothyroxine Sodium) .... Take One Tablet Once Daily 4)  Coreg 25 Mg Tabs (Carvedilol) .... Take One Tablet Two Times A Day 5)  Enalapril Maleate 20 Mg Tabs (Enalapril Maleate) .... Take One Tablet Two Times A Day 6)  Crestor  40 Mg Tabs (Rosuvastatin Calcium) .... One Tablet Daily 7)  Klor-Con 8 Meq Cr-Tabs (Potassium Chloride) .... Take One Tablet Twice Daily 8)  Aspirin 81 Mg Tabs (Aspirin) .... Once Daily 9)  Furosemide 40 Mg Tabs (Furosemide) .... One Tablet Daily 10)  Norvasc 10 Mg Tabs (Amlodipine Besylate) .... One Tablet Daily 11)  Tylenol Arthritis Pain 650 Mg Cr-Tabs (Acetaminophen) .... One Tablet Every 8 Hours As Needed For Pain 12)  Tramadol Hcl 50 Mg Tabs (Tramadol Hcl) .... One Tablet Twice A Day As Needed For Pain 13)  Naproxen 250 Mg Tabs (Naproxen) .... Take One Tablet Two Times A Day As Needed For Pain  Allergies (verified): No Known Drug Allergies  Past History:  Past Medical History: Reviewed history from 12/24/2009 and no changes required. 1. Coronary artery disease.  The patient had an acute anterior MI in March 2008.  She had 90% LAD stenosis with thrombus.  She did have 2.75 x 12 Taxus drug-eluting stent placed in the proximal LAD.  She did also have 80% first obtuse marginal stenosis, 70% distal circumflex stenosis and 50% proximal RCA stenosis.  Her EF was initially 30-35% at the time of her MI; however, it rose after primary PCI to 60%.  ETT-myoview (3/11): EF 68%, hypertensive BP response, normal perfusion at rest and stress (negative study).  2.  Hypertension. 3. Hypercholesterolemia. 4. Gastroesophageal reflux disease. 5. Mild carotid stenosis less than 50% bilaterally.  6. Echo (3/11): EF 60-65%, mild diastolic dysfunction, mild MR, normal RV.   Family History: Reviewed history from 09/17/2008 and no changes required. Noncontributory.  Social History: Reviewed history from 09/17/2008 and no changes required. The patient lives in [place].  She does not smoke or drink alcohol.   Review of Systems       All systems reviewed and negative except as per HPI.   Vital Signs:  Patient profile:   77 year old female Height:      59 inches Weight:      149 pounds BMI:      30.20 Pulse rate:   58 / minute Pulse rhythm:   regular BP sitting:   124 / 68  (left arm) Cuff size:   large  Vitals Entered By: Judithe Modest CMA (March 25, 2010 9:51 AM)  Physical Exam  General:  Well developed, well nourished, in no acute distress. Neck:  Neck supple, no JVD. No masses, thyromegaly or abnormal cervical nodes. Lungs:  Clear bilaterally to auscultation and percussion. Heart:  Non-displaced PMI, chest non-tender; regular rate and rhythm, S1, S2 without rubs or gallops. 2/6 early systolic ejection-type murmur.  Carotid upstroke normal, no bruit. Pedals normal pulses. No edema.  Abdomen:  Bowel sounds positive; abdomen soft and non-tender without masses, organomegaly, or hernias noted. No hepatosplenomegaly. Extremities:  No clubbing or cyanosis. Neurologic:  Alert and oriented x 3. Psych:  Normal affect.   Impression & Recommendations:  Problem # 1:  CAD, NATIVE VESSEL (ICD-414.01) Myoview in 3/11 showed no evidence for ischemia or infarction.  No chest pain.  Continue current meds including Coreg, ASA, Plavix, ACEI, and statin.   Problem # 2:  CHRONIC DIASTOLIC HEART FAILURE (ICD-428.32) Patient appears euvolemic and symptomatically much better, likely NYHA class II.  BP is now under control which likely helps.  Continue current Lasix dosing.  Will check BMET and BNP today.   Problem # 3:  HYPERLIPIDEMIA-MIXED (ICD-272.4) Lipids at goal in 4/11 (LDL < 70).   Problem # 4:  HYPERTENSION, UNSPECIFIED (ICD-401.9) BP under control on current regimen, continue.    Other Orders: TLB-BNP (B-Natriuretic Peptide) (83880-BNPR) TLB-BMP (Basic Metabolic Panel-BMET) (80048-METABOL)  Patient Instructions: 1)  Your physician recommends that you have lab today--BMP/BNP 414.01 401.9 428.32 2)  Your physician wants you to follow-up in:  4 months with Dr Shirlee Latch.  You will receive a reminder letter in the mail two months in advance. If you don't receive a letter, please call our  office to schedule the follow-up appointment.

## 2010-12-27 ENCOUNTER — Encounter: Payer: Self-pay | Admitting: Cardiology

## 2011-01-09 ENCOUNTER — Encounter: Payer: Self-pay | Admitting: Cardiology

## 2011-01-09 ENCOUNTER — Ambulatory Visit (INDEPENDENT_AMBULATORY_CARE_PROVIDER_SITE_OTHER): Payer: Medicare Other | Admitting: Cardiology

## 2011-01-09 ENCOUNTER — Encounter: Payer: Self-pay | Admitting: *Deleted

## 2011-01-09 DIAGNOSIS — I10 Essential (primary) hypertension: Secondary | ICD-10-CM

## 2011-01-09 DIAGNOSIS — I5032 Chronic diastolic (congestive) heart failure: Secondary | ICD-10-CM

## 2011-01-09 DIAGNOSIS — I2510 Atherosclerotic heart disease of native coronary artery without angina pectoris: Secondary | ICD-10-CM

## 2011-01-09 DIAGNOSIS — I509 Heart failure, unspecified: Secondary | ICD-10-CM

## 2011-01-09 DIAGNOSIS — E785 Hyperlipidemia, unspecified: Secondary | ICD-10-CM

## 2011-01-09 DIAGNOSIS — R0602 Shortness of breath: Secondary | ICD-10-CM

## 2011-01-09 LAB — BASIC METABOLIC PANEL
CO2: 31 mEq/L (ref 19–32)
Calcium: 9.3 mg/dL (ref 8.4–10.5)
Creatinine, Ser: 0.8 mg/dL (ref 0.4–1.2)
Glucose, Bld: 112 mg/dL — ABNORMAL HIGH (ref 70–99)

## 2011-01-09 LAB — LIPID PANEL
LDL Cholesterol: 110 mg/dL — ABNORMAL HIGH (ref 0–99)
Total CHOL/HDL Ratio: 4
Triglycerides: 102 mg/dL (ref 0.0–149.0)
VLDL: 20.4 mg/dL (ref 0.0–40.0)

## 2011-01-09 LAB — BRAIN NATRIURETIC PEPTIDE: Pro B Natriuretic peptide (BNP): 27.6 pg/mL (ref 0.0–100.0)

## 2011-01-09 MED ORDER — POTASSIUM CHLORIDE 8 MEQ PO TBCR: EXTENDED_RELEASE_TABLET | ORAL | Status: DC

## 2011-01-09 MED ORDER — FUROSEMIDE 40 MG PO TABS: ORAL_TABLET | ORAL | Status: DC

## 2011-01-09 NOTE — Patient Instructions (Addendum)
Increase Lasix (furosemdie) to 40mg  in the morning and 20mg  in the afternoon. This will be one 40mg  tablet in the morning and 20mg  (1/2 of a 40mg  tablet) in the afternoon.  Increase KCL(potassium) to 16 mEq ( 2 - 8 mEq tablets) in the morning and 8 mEq in the afternoon.  Lab today ---Lipid profile/BMP/BNP 414.01  428.32  Lab in 2 weeks---BMP 414.01 428.32--you have the order. Please fax the results to [phone] Dr Shirlee Latch.  Appointment with Dr Shirlee Latch in 2 months.

## 2011-01-10 NOTE — Assessment & Plan Note (Signed)
Will check lipids today, goal LDL < 70.

## 2011-01-10 NOTE — Assessment & Plan Note (Signed)
BP is elevated today but she did not take any of her meds.  When she checks at home on meds, it is good.  Continue current regimen.

## 2011-01-10 NOTE — Progress Notes (Signed)
PCP: Dr. Illene Regulus  77 yo with history of CAD s/p PCI returns for cardiology followup today.  She initially had depressed LV EF after her MI but this rose back to normal range on last echo in 2009.  She had an echo done in 3/11 showing preserved LV systolic function and mild diastolic dysfunction.  ETT-myoview was done in 3/11 with a hypertensive BP response on the treadmill but no evidence for ischemia or infarction on images.  Since last appointment, she reports about 2 months of increased dyspnea.  She is more short of breath walking up the flight of steps to her apartment.  She occasionally has orthopnea (but not always).  No chest pain.  Her ankles have been swelling more than usual.  Despite increased dyspnea, her weight is actually down by 6 lbs since the prior appointment.  Her BP is running high today but she has not taken any of her meds yet this morning.  When she takes her medications, systolic BP is always < 140 (checks 3 times a week).    Labs (2/10): LDL 53, HDL 44 Labs (2/11): BNP 261, creatinine 0.7, K 3.9, LDL 96, HDL 57 Labs (3/11): BNP 48, K 3.7, creatinine 0.8 Labs (4/11): LDL 61, HDL 42, LFTs normal Labs (1/61): K 4.5, creatinine 0.7, BNP 40 Labs (10/11): K 4.1, creatinine 0.7, LDL 69, HDL 41  ECG: Abnormal P wave axis (have seen this on past ECGs), nonspecific ST-T changes  Allergies (verified):  No Known Drug Allergies  Past Medical History: 1. Coronary artery disease.  The patient had an acute anterior MI in March 2008.  She had 90% LAD stenosis with thrombus.  She did have 2.75 x 12 Taxus drug-eluting stent placed in the proximal LAD.  She did also have 80% first obtuse marginal stenosis, 70% distal circumflex stenosis and 50% proximal RCA stenosis.  Her EF was initially 30-35% at the time of her MI; however, it rose after primary PCI to 60%.  ETT-myoview (3/11): EF 68%, hypertensive BP response, normal perfusion at rest and stress (negative study).  2. Hypertension. 3.  Hypercholesterolemia. 4. Gastroesophageal reflux disease. 5. Mild carotid stenosis less than 50% bilaterally. 6. Echo (3/11): EF 60-65%, mild diastolic dysfunction, mild MR, normal RV.   Family History: Noncontributory.  Social History: The patient lives alone in an apartment in [place].  She does not smoke or drink alcohol.  Review of Systems        All systems reviewed and negative except as per HPI.   Current Outpatient Prescriptions  Medication Sig Dispense Refill  . acetaminophen (TYLENOL) 650 MG CR tablet Take 650 mg by mouth every 8 (eight) hours as needed.        Marland Kitchen amLODipine (NORVASC) 10 MG tablet Take 10 mg by mouth daily.        Marland Kitchen aspirin 81 MG tablet Take 81 mg by mouth daily.        . carvedilol (COREG) 25 MG tablet 25 mg. Take 1 tablet by mouth two times a day      . clopidogrel (PLAVIX) 75 MG tablet Take 75 mg by mouth daily.        . enalapril (VASOTEC) 20 MG tablet Take 20 mg by mouth 2 (two) times daily.        . hydrochlorothiazide 25 MG tablet Take 25 mg by mouth daily.        Marland Kitchen levothyroxine (SYNTHROID, LEVOTHROID) 100 MCG tablet Take 100 mcg by mouth daily.        Marland Kitchen  naproxen (NAPROSYN) 250 MG tablet Take 250 mg by mouth 2 (two) times daily with a meal.        . potassium chloride (KLOR-CON) 8 MEQ CR tablet Take 2 tablets in the morning and 1 tablet in the afternoon  90 tablet  6  . rosuvastatin (CRESTOR) 40 MG tablet Take 40 mg by mouth daily.        . traMADol (ULTRAM) 50 MG tablet Take 50 mg by mouth 2 (two) times daily as needed.        . furosemide (LASIX) 40 MG tablet Take 1 tablet in the morning and 1/2 tablet in the afternoon  45 tablet  6    BP 158/80  Pulse 68  Resp 18  Ht 4\' 5"  (1.346 m)  Wt 151 lb (68.493 kg)  BMI 37.79 kg/m2 General:  Well developed, well nourished, in no acute distress. Neck:  Neck supple, JVP 8 cm. No masses, thyromegaly or abnormal cervical nodes. Lungs:  Clear bilaterally to auscultation and percussion. Heart:   Non-displaced PMI, chest non-tender; regular rate and rhythm, S1, S2.  +S4. 2/6 early systolic ejection-type murmur.  Carotid upstroke normal, no bruit. Pedals normal pulses. No edema.  Abdomen:  Bowel sounds positive; abdomen soft and non-tender without masses, organomegaly, or hernias noted. No hepatosplenomegaly. Extremities:  No clubbing or cyanosis. Neurologic:  Alert and oriented x 3. Psych:  Normal affect.

## 2011-01-10 NOTE — Assessment & Plan Note (Signed)
No chest pain, unchanged ECG, and no ischemia on 3/11 myoview.  Continue ASA, Plavix, Coreg, ACEI, and statin.

## 2011-01-10 NOTE — Assessment & Plan Note (Signed)
NYHA class III symptoms have worsened over the last couple of months.  Will check BMET/BNP today and will increase her Lasix to 40 mg qam, 20 mg qpm.  Increase KCl to 16 mEq qam, 8 mEq qpm.  Will get BMET in 2 wks.  She will followup again in 2 months.

## 2011-01-24 ENCOUNTER — Encounter: Payer: Self-pay | Admitting: Cardiology

## 2011-02-14 ENCOUNTER — Other Ambulatory Visit: Payer: Self-pay | Admitting: Cardiology

## 2011-02-14 MED ORDER — AMLODIPINE BESYLATE 10 MG PO TABS: 10.0000 mg | ORAL_TABLET | Freq: Every day | ORAL | Status: DC

## 2011-02-18 NOTE — Assessment & Plan Note (Signed)
[facility]                            CARDIOLOGY OFFICE NOTE   NAME:[name] [name], [name] [name]                       MRN:          [id number]  DATE:03/25/2007                            DOB:          Jan 21, 1934    PRIMARY CARE PHYSICIAN:  Dr. Izola Price in [place].   HISTORY OF PRESENT ILLNESS:  Ms. Mcvey is a 77 year old lady who  suffered anterior myocardial infarction in January of 2008.  I placed a  Taxus drug-eluting stent in her proximal LAD.  She has a 70% stenosis in  the obtuse marginal which we are managing medically.  Her ejection  fraction was initially 35% but has now improved to 60%.   She had been doing very nicely without any recurrent chest discomfort.  She has some very mild exertional dyspnea which is no different than it  was prior to her myocardial infarction.  She denies any paroxysmal  nocturnal dyspnea, orthopnea, edema, palpitations, syncope, and  presyncope.  In short, she feels herself to be doing very well.  She has  been busy picking blackberries lately.   CURRENT MEDICATIONS:  1. Aspirin 81 mg daily.  2. Plavix 75 mg daily.  3. Lipitor 80 mg daily.  4. Carvedilol 12.5 mg twice daily.  5. Enalapril 20 mg daily.  6. Levothyroxine 100 mcg daily.  7. Hydrochlorothiazide 25 mg daily.  8. Metoprolol 50 mg twice daily.  Metoprolol was started by Dr. Izola Price.      When it was started is unclear to me.   PHYSICAL EXAMINATION:  She is generally well appearing in no distress.  Heart rate 57, blood pressure 162/80 and weight of 146 pounds.  Weight  is stable.  She has no jugular venous distension, thyromegaly, or lymphadenopathy.  Respiratory effort is normal.  Lungs are clear to auscultation.  She had a nondisplaced point of maximal cardiac impulse.  There is a  regular rate and rhythm without murmurs, rubs, or gallops.  The abdomen is soft, nondistended, nontender.  There is no  hepatosplenomegaly.  Bowel sounds are normal.  EXTREMITIES:  Warm without cyanosis, clubbing, edema, or ulceration.  Carotid pulse is 2+ bilaterally with a bruit on the right.   IMPRESSION/RECOMMENDATIONS:  1. Coronary disease, status post anterior myocardial infarction:  She      is asymptomatic.  Ejection fraction is now normal.  Continue the      aspirin and Plavix indefinitely due to the drug-eluting stent in      the proximal LAD.  We will increase the carvedilol dose to 25 mg      twice daily.  Continue angiotensin-converting enzyme inhibitor at      present dose.  Will stop the metoprolol, as she need not be on 2      beta blockers.  I have written this on the bottle for her.  2. Hypertension:  Higher than I would like.  May come down with the      increase in carvedilol.  Will have her check her blood pressure on      a daily basis and bring these back  in 6 weeks.  3. Gastroesophageal reflux disease:  Per Dr. Izola Price.  4. Hypercholesterolemia:  Per Dr. Izola Price.  Goal LDL at less than 70.  5. Cerebrovascular disease:  Less than 40% stenosis bilaterally by      ultrasound in March of this year.   I will have her follow up with Dr. Diona Browner in 6 weeks' time as I will  be moving.     Salvadore Farber, MD  Electronically Signed    WED/MedQ  DD: 03/25/2007  DT: 03/25/2007  Job #: [id number]   cc:   at Yvonna Alanis, MD

## 2011-02-18 NOTE — Assessment & Plan Note (Signed)
[facility]                            CARDIOLOGY OFFICE NOTE   NAME:[name] [name], [name] [name]                       MRN:          [id number]  DATE:12/10/2007                            DOB:          [birth year]    REASON FOR VISIT:  Cardiac follow-up.   HISTORY OF PRESENT ILLNESS:  I saw Ms. Berringer back in September.  She is  doing well, denying any problems with angina or progressive dyspnea on  exertion.  Her cardiac history as outlined on my previous note.  I note  that today's electrocardiogram shows an ectopic atrial rhythm at 56  beats per minute which is new in comparison to her previous tracing.  She has nonspecific ST-T wave changes.  She has not had any problems  with frank dizziness or syncope.  Her medications are outlined below.   ALLERGIES:  No known drug allergies.   MEDICATIONS:  1. Plavix 75 mg p.o. daily.  2. Hydrochlorothiazide 25 mg p.o. daily.  3. Levoxyl 0.1 mg p.o. daily.  4. Carvedilol 25 mg p.o. b.i.d.  5. Enalapril 20 mg p.o. b.i.d.  6. Lipitor 80 mg p.o. q.h.s.  7. Potassium 8 mEq p.o. daily.  8. Aspirin 81 mg p.o.  9. Sublingual nitroglycerin 0.4 mg p.r.n.   REVIEW OF SYSTEMS:  As described in the history of present illness.  She  had some problems with right neck discomfort with movement that seemed  arthritic in nature. No lower extremity edema, orthopnea or PND,  otherwise systems negative.   PHYSICAL EXAMINATION:  VITAL SIGNS:  Blood pressure is 167/75, heart  rate 62, weight stable at 145 pounds.  GENERAL:  The patient is comfortable in no acute distress.  NECK:  No elevated jugular venous pressure and no loud bruits.  LUNGS:  Clear without labored breathing.  CARDIAC:  Regular rate and rhythm.  No loud murmur or gallop.  EXTREMITIES:  No pitting edema.   IMPRESSION/RECOMMENDATIONS:  1. Coronary artery disease status post anterior wall myocardial      infarction with subsequent normalization of left ventricular   function following drug-eluting stent placement to the left      anterior descending in January 2008.  The patient is continuing to      do well on medical therapy.  I note an asymptomatic ectopic atrial      bradycardia on electrocardiogram.  This can be watched.  If she      manifests any progressive weakness or dizziness/syncope, we may      need to assess further with an event recorder.  Her carvedilol      could always be decreased although I am reluctant to make any      changes since she has done so well symptomatically.  I will plan to      see her back over the next 6 months.  2. Hyperlipidemia, followed by primary care.  Goal LDL should be      around 70.  She is on high-dose statin therapy.  3. Hypertension, not well-controlled.  The patient states that she has  not taken all of her medicines yet today.  Her blood pressure was      very good at her last visit.     Jonelle Sidle, MD  Electronically Signed    SGM/MedQ  DD: 12/10/2007  DT: 12/11/2007  Job #: [id number]

## 2011-02-18 NOTE — Assessment & Plan Note (Signed)
[facility]ALTH[facility]                            CARDIOLOGY OFFICE NOTE   NAME:[name] [name], [name] [name]                       MRN:          [id number]  DATE:07/17/2008                            DOB:          1933/10/27    PRIMARY CARE PHYSICIAN:  Dr. Illene Regulus, at [facility]   HISTORY OF PRESENT ILLNESS:  This is a 78 year old with history of  anterior MI and primary PCI in March 2008 presents to Cardiology Clinic  for evaluation of her coronary disease.  The patient has been doing  quite well lately.  She denies any episodes of chest pain.  She does  however state that she get short of breath after walking about a block  and a half on flat ground.  She is able to climb steps.  She has steps  in her house, has to walk up and down every day.  However, she says when  she gets to the top of the flight of steps, she gets a little short of  breath.  She does not have any orthopnea or PND.  Her blood pressure is  elevated today at 164/90; however, she states that she did not take any  of her blood pressure medications this morning.  Finally at the time of  her MI, her LV ejection fraction was 35%; however, when checked again  following primary PCI her EF had risen up to 60%.   PAST MEDICAL HISTORY:  1. Coronary artery disease.  The patient had an acute anterior MI in      March 2008.  She had 90% LAD stenosis with thrombus.  She did have      2.75 x 12 Taxus drug-eluting stent placed in the proximal LAD.  She      did also have 80% first obtuse marginal stenosis, 70% distal      circumflex stenosis and 50% proximal RCA stenosis.  Her EF was      initially 30-35% at the time of her MI; however, it rose after      primary PCI to 60%.  2. Hypertension.  3. Hypercholesterolemia.  4. Gastroesophageal reflux disease.  5. Mild carotid stenosis less than 50% bilaterally.   MEDICATIONS:  1. Plavix 75 mg daily.  2. Hydrochlorothiazide 25 mg daily.  3. Levoxyl 0.1 mg  daily.  4. Coreg 25 mg daily.  5. Enalapril 20 mg b.i.d.  6. Lipitor 80 mg daily.  7. Kcl.  8. Potassium chloride 8 mEq daily.  9. Aspirin 81 mg daily.   SOCIAL HISTORY:  The patient lives in [place].  She does not smoke  or drink alcohol.   PHYSICAL EXAMINATION:  VITAL SIGNS:  Blood pressure 164/90, heart rate  is 65 and regular, weight is 148 pounds.  GENERAL:  This is a elderly female in no apparent distress.  NEUROLOGICAL:  Alert and oriented x3.  Normal affect.  NECK:  No JVD.  No thyromegaly.  No thyroid nodule.  LUNGS:  Clear to auscultation bilaterally.  HEART:  Regular S1 and S2.  There is a 2/6 crescendo-decrescendo early  peaking  systolic murmur at the upper sternal border.  There is no S3.  There is no S4.  There is no peripheral edema.  There is no carotid  bruit.  There are 2+ posterior tibial pulses bilaterally.  ABDOMEN:  Soft, nontender.  No hepatosplenomegaly.  EXTREMITIES:  No clubbing or cyanosis.   EKG today reveals what appears to be an ectopic atrial rhythm at a rate  of about 62.  There is evidence for possible old anteroseptal MI.   ASSESSMENT AND PLAN:  This is a 77 year old with history of coronary  artery disease status post anterior myocardial infarction in 2008 and  primary PCI.  1. Coronary artery disease.  The patient denies any ischemic symptoms.      She will continue on her aspirin, Plavix, beta-blocker and ACE      inhibitor.  2. Hypercholesterolemia.  We do not have available any recent lipid      numbers.  We will go ahead and since she is fasting today have  her      cholesterol checked today.  She is on Lipitor 80 mg daily.  3. Hypertension.  The patient's blood pressure is 164/90; however, she      did not take her Coreg or her enalapril today.  We are going to      have her back to do an echocardiogram so at that time we will check      her blood pressure on her medications and if it is not below at      least 140/90, we will go  ahead and probably add a calcium channel      blocker.  4. Dyspnea on exertion.  The patient does report some mild dyspnea on      exertion seems to be New York Heart Association class II symptoms.      This may be some mild diastolic dysfunction; however, we will go      ahead and get an echocardiogram since she has not had one in a      couple years.  We will make sure her left ventricular function is      preserved and I will see her back in the clinic in about 6 months.     Marca Ancona, MD  Electronically Signed    DM/MedQ  DD: 07/17/2008  DT: 07/17/2008  Job #: [id number]   cc:   Karrie Doffing, MD

## 2011-02-18 NOTE — Assessment & Plan Note (Signed)
[facility]                            CARDIOLOGY OFFICE NOTE   NAME:[name] [name], [name] [name]                      MRN:          [id number]  DATE:07/01/2007                            DOB:          01/02/1934    PRIMARY CARE PHYSICIAN:  Dr. Lenise Arena.   REASON FOR VISIT:  Coronary artery disease followup.   HISTORY OF PRESENT ILLNESS:  [name] [name] is a pleasant woman previously  followed by Dr. Samule Ohm. She has a history of an anterior wall myocardial  infarction in January of this year, status post placement of a drug-  eluting stent in her proximal left anterior descending. She has a  medically managed 70% obtuse marginal stenosis and a subsequent ejection  fraction of 60%. She is doing quite well. She is not having any  exertional angina and does all of her activities of daily living. She  reports compliance of her medications. She states that her cholesterol  has been followed by Dr. Lenise Arena. Her electrocardiogram today shows sinus  rhythm with nonspecific ST-T wave changes.   ALLERGIES:  No known drug allergies.   PRESENT MEDICATIONS:  1. Aspirin 81 mg p.o. daily.  2. Carvedilol 25 mg p.o. b.i.d.  3. Lipitor 80 mg p.o. daily.  4. Plavix 75 mg p.o. daily.  5. Enalapril 20 mg p.o. b.i.d.  6. Levothyroxine 100 mcg p.o. daily.  7. Potassium supplements.  8. Hydrochlorothiazide 25 mg p.o. daily.   REVIEW OF SYSTEMS:  As described in history of present illness.   PHYSICAL EXAMINATION:  Blood pressure is 122/70, heart rate is 52,  weight 146. Patient is comfortable in no acute distress.  EXAMINATION OF THE NECK: Reveals no elevated jugular venous pressure. No  loud bruits.  No thyromegaly is noted.  LUNGS: Clear without labored breathing at rest.  CARDIAC EXAM: Reveals a regular rate and rhythm. No loud murmur or  gallop.  ABDOMEN: Soft, nontender, normoactive bowel sounds.  EXTREMITIES: Show no significant edema.   IMPRESSION/RECOMMENDATIONS:  1.  Coronary artery disease, status post previous anterior wall      myocardial infarction with improvement to normal ejection fraction      following drug-eluting stent placement to the proximal left      anterior descending in January of this year. We will plan to      continue medical therapy. She will have a routine follow up visit      scheduled for the next 6 months.  2. Hypertension, much better controlled today.  3. Hyperlipidemia, followed by Dr. Lenise Arena. Goal LDL cholesterol should      be 70 or less.     Jonelle Sidle, MD  Electronically Signed    SGM/MedQ  DD: 07/01/2007  DT: 07/02/2007  Job #: [id number]   cc:   Tawny Asal

## 2011-02-21 NOTE — H&P (Signed)
NAMEBRITANEE, [name] [name]               ACCOUNT NO.:  [id number]   MEDICAL RECORD NO.:  [id number]          PATIENT TYPE:  INP   LOCATION:  1823                         FACILITY:  [facility]   PHYSICIAN:  Bevelyn Buckles. Bensimhon, MDDATE OF BIRTH:  1934-10-01   DATE OF ADMISSION:  10/19/2006  DATE OF DISCHARGE:                              HISTORY & PHYSICAL   PRIMARY CARE PHYSICIAN:  Dr. Lenise Arena with [facility].  He is new to [facility]  Cardiology.   REASON FOR ADMISSION:  Acute anterolateral ST segment elevation  myocardial infarction.   HISTORY OF PRESENT ILLNESS:  [name] [name] is a 77 year old woman with a  history of valvular disease (two stuck valves) and hypertension.  She  denies any history of known coronary disease.  She has had a previous  cardiac catheterization in [place], [place], and followed by a  cardiologist there, though I do  not know the results and she says she  has not followed up there for more than two years.  She says she was  doing well until this morning at 9 a.m. when she had the acute onset of  chest pain and abdominal discomfort with nausea.  EMS was activated.  EKG showed diffuse ST elevation inferior and anterolaterally.   MEDICATIONS:  She is unable to tell me what medications she is on.   ALLERGIES:  NO KNOWN DRUG ALLERGIES.   REVIEW OF SYSTEMS:  She denies any recent bleeding, no history of  diabetes, no heart failure, no claudication.  No fever or chills.  Remainder of the review of systems is negative except for HPI and  problem list.   PAST MEDICAL HISTORY:  Relatively unknown.  She does have a history of  hypertension and also valvular disease, details unclear.  She denies  diabetes.   SOCIAL HISTORY:  She is widowed.  [place]  She lives alone.  No reported history of tobacco or alcohol.   FAMILY HISTORY:  Unavailable.   PHYSICAL EXAMINATION:  GENERAL APPEARANCE:  She is uncomfortable with  chest pain.  VITAL SIGNS:   Blood pressure 145/61 with heart rate 68.  She is having  some pain but her respirations are unlabored.  NECK:  Supple.  There is no JVD.  Carotids are 2+ bilaterally without  any obvious bruits.  There is no lymphadenopathy or thyromegaly.  LUNGS:  Clear.  CARDIOVASCULAR:  She has distant heart sounds.  She is regular.  No  obvious murmur.  ABDOMEN:  Soft, nontender, nondistended, no hepatosplenomegaly, no  bruits, no masses.  EXTREMITIES:  Warm with no clubbing, cyanosis, or edema.  Femoral pulses  are 2+ bilaterally.  DP pulses are 2+ bilaterally.  There is no rashes.  NEUROLOGIC:  She is alert and oriented x3.  Cranial nerves II-XII  grossly intact.  She moves all four extremities spontaneously.  Affect  is uncomfortable.   Point of care markers show an MB of 9.7, troponin 0.49.   EKG shows sinus rhythm with prominent diffuse ST elevation inferiorly,  anterolaterally.   ASSESSMENT/PLAN:  Acute anterolateral ST segment elevation myocardial  infarction with also inferior ST elevation.  She is now in the  catheterization lab undergoing emergent catheterization by Dr. Samule Ohm.  We will need to get her records from primary care physician.  She will  also need a 2-D echocardiogram.      Bevelyn Buckles. Bensimhon, MD  Electronically Signed     DRB/MEDQ  D:  10/19/2006  T:  10/19/2006  Job:  [id number]

## 2011-02-21 NOTE — Discharge Summary (Signed)
[name] [name], [name] [name]               ACCOUNT NO.:  [id number]   MEDICAL RECORD NO.:  [id number]          PATIENT TYPE:  INP   LOCATION:  4703                         FACILITY:  [facility]   PHYSICIAN:  Salvadore Farber, MD  DATE OF BIRTH:  07-Jan-1934   DATE OF ADMISSION:  10/19/2006  DATE OF DISCHARGE:  10/22/2006                               DISCHARGE SUMMARY   PROCEDURES:  1. Cardiac catheterization.  2. Coronary arteriogram.  3. Left ventriculogram.   PRIMARY DIAGNOSIS:  Acute anterolateral ST-segment elevation MI.   SECONDARY DIAGNOSES:  1. Obesity.  2. Dyslipidemia, with a total cholesterol of 162, triglycerides 77,      HDL 37, LDL 110.  3. Hypotension postprocedure (tolerating beta blocker well).  4. Ischemic cardiomyopathy, with an ejection fraction of 35% and      multiple wall motion abnormalities.  (No significant valvular      abnormalities at catheterization.).   TIME AT DISCHARGE:  37 minutes.   HOSPITAL COURSE:  [name] [name] is a 77 year old female with no previous  history of coronary artery disease.  She has a remote history of cardiac  catheterization without intervention in [place], [place], but per the  patient there was nothing wrong.  At 9 a.m. on the morning of  admission, she had acute onset of chest pain and abdominal discomfort.  EMS transported her urgently to the hospital because of diffuse ST  elevation, and she was taken urgently to the catheterization lab.   The cardiac catheterization showed a 95% LAD that was treated with PTCA  and a Taxus stent, reducing the stenosis to 0.  She had an obtuse  marginal with 70% stenosis, and 50% lesions in the RCA and circumflex,  for which medical therapy was recommended.   A 2D echocardiogram was performed and showed left ventricular  dysfunction which will be followed.  Her potassium was slightly low at  3.4, and this was supplemented.  A fasting lipid profile was performed,  with results above.  TSH and  hemoglobin A1c were within normal limits.  [name] [name] was seen by cardiac rehab.   [name] [name] blood pressure improved, and she was started on a low dose  of Coreg.  She was seen by cardiac rehab and ambulating 500 feet with  normal heart rate and blood pressure after ambulation.  She is referred  to outpatient cardiac rehab.  Her peak CK-MB was 576/55, with a peak  troponin of 12.21.  This trended down, and she had no further episodes  of chest pain.  Her EKG improved.  She was evaluated by Dr. Samule Ohm on  October 22, 2006 and considered stable for discharge, with outpatient  followup arranged.   DISCHARGE INSTRUCTIONS:  1. She is not to drive for 3 days.  2. She is not to do any lifting for 3 weeks.  3. She is to call our office for problems with the catheterization      site.  4. She is to stick to a low fat and salt diet.  5. She is to follow up with cardiac rehab and increase her  activity      per rehab guidelines.   DISCHARGE MEDICATIONS:  1. Coreg 6.25 mg b.i.d.  2. Aspirin 325 mg a day.  3. Plavix 75 mg a day.  4. Lipitor 80 mg a day.  5. Sublingual nitroglycerin p.r.n.Marland Kitchen   She is to get an echocardiogram on November 24, 2006 at 3 p.m., and she  is to see Dr. Samule Ohm on November 26, 2006 at 2:30.  She is to see Dr.  Lenise Arena with a [facility] as needed.      Theodore Demark, PA-C      Salvadore Farber, MD  Electronically Signed    RB/MEDQ  D:  10/22/2006  T:  10/22/2006  Job:  [id number]   cc:   Dr. Lenise Arena

## 2011-02-21 NOTE — Cardiovascular Report (Signed)
[name] [name], [name] [name]                ACCOUNT NO.:  [id number]   MEDICAL RECORD NO.:  [id number]          PATIENT TYPE:  REC   LOCATION:  [facility]                          FACILITY:  [facility]   PHYSICIAN:  Salvadore Farber, MD  DATE OF BIRTH:  07-31-1934   DATE OF PROCEDURE:  12/18/2006  DATE OF DISCHARGE:                            CARDIAC CATHETERIZATION   PROCEDURES:  1. Left heart catheterization.  2. Left ventriculography.  3. Coronary angiography.  4. DIVER thrombectomy of the proximal left anterior descending.  5. Drug-eluting stent placement in the proximal left anterior      descending.  6. AngioSeal closure of the right common femoral arteriotomy site.   INDICATIONS:  Ms. [name] [name] is a 77 year old lady with no known history of  coronary artery disease.  She presented today with acute anterolateral  myocardial infarction.  She was brought emergently to the cardiac  catheterization lab for angiography with an eye to percutaneous  revascularization.   PROCEDURAL TECHNIQUE:  Informed consent was obtained.  Under 1%  lidocaine local anesthesia, a 5-French sheath was placed in the right  common femoral artery using the modified Seldinger technique.  Diagnostic angiography was performed using JL-4 and JR-4 catheters.  Left heart catheterization and ventriculography were then performed  using a pigtail catheter.  These images demonstrated thrombotic 99%  stenosis of the proximal LAD.  Decision was made to proceed to  percutaneous revascularization.   Anticoagulation was initiated with bivalirudin.  ACT was confirmed to be  greater than 225 seconds.  A CHAMPION study drug was administered.  A 6-  [place] CLS 3.5 guide was advanced over wire and engaged in the ostium of  the left main.  A Prowater wire was advanced to the distal LAD without  difficulty.  Aspiration thrombectomy using a DIVER catheter for a single  pass was then performed; 600 mg of Plavix was administered.  The lesion  was then stented using a 2.75 x 12 mm TAXUS deployed at 16 atmospheres.  The stent was then postdilated using a 3.0 x 12 mm Quantum inflated to  16 atmospheres.  Final angiography demonstrated no residual stenosis, no  dissection, and TIMI-3 flow to the distal vasculature.  The arteriotomy  was then closed using an AngioSeal device.  Complete hemostasis was  obtained.  The patient was then transferred to the intensive care unit  in stable condition having tolerated the procedure well.   COMPLICATIONS:  None.   FINDINGS:  1. LV: 143/10/13.  EF 35% with anterolateral, apical, and distal      inferior akinesis.  2. No aortic stenosis or mitral regurgitation.  3. Left main:  Angiographically normal.  4. LAD:  A very large vessel which wraps the apex of the heart to      supply the majority of the inferior wall.  There was a 99%      thrombotic stenosis of the proximal vessel which was treated with      aspiration thrombectomy and placement of a drug-eluting stent,      reducing the stenosis to 0% and improving flow from TIMI-2  to TIMI-      3.  5. Circumflex:  Moderate-size vessel giving rise to two marginals.      The first marginal has an 80% stenosis proximally.  This vessel was      small.  The small distal AV groove portion of the circumflex has a      70% stenosis.  6  RCA:  Small though technically dominant vessel with long 50% stenosis  proximally.   IMPRESSION AND RECOMMENDATIONS:  Successful percutaneous  revascularization of the culprit lesion in the proximal left anterior  descending (LAD) using aspiration thrombectomy and a drug-eluting stent.  She should be maintained on aspirin and Plavix indefinitely given the  drug-eluting stent in the proximal LAD.  A statin, beta-blocker, and  ACE inhibitor will be administered.      Salvadore Farber, MD  Electronically Signed     WED/MEDQ  D:  12/18/2006  T:  12/20/2006  Job:  [id number]   cc:   Dr. Colonel Bald

## 2011-02-25 NOTE — Assessment & Plan Note (Signed)
[facility]                            CARDIOLOGY OFFICE NOTE   NAME:[name], [name]                       MRN:          [id number]  DATE:12/17/2006                            DOB:          27-Aug-1934    PRIMARY CARE PHYSICIAN:  Dr. Izola Price in [place].   HISTORY OF PRESENT ILLNESS:  Ms. [name] [name] is a 77 year old lady who  suffered anterior myocardial infarction in January of 2008.  I placed a  TAXUS drug-eluting stent in the proximal LAD.  She had a 70% stenosis in  marginal, which we are managing medically.  Her ejection fraction was  initially 35%, but has now improved to 60%.   She has not had any recurrent chest pain.  She is having some mild  exertional dyspnea, which is no different than prior to her myocardial  infarction.  She has no PND, orthopnea, edema, palpitations, or syncope.   CURRENT MEDICATIONS:  1. Aspirin 81 mg daily.  2. Plavix 75 mg daily.  3. Coreg 12.5 mg twice daily.  4. Lipitor 80 mg daily.  5. Enalapril 20 mg twice daily.  6. Levothyroxine 100 mcg daily.  7. Hydrochlorothiazide 25 mg daily.  8. K-Dur 18 mEq daily.  9. Lovastatin 80 mg daily.  10.Omeprazole 20 mg twice daily.   PHYSICAL EXAMINATION:  She is well-appearing in no distress.  Heart rate 60, blood pressure 140/70, and weight 146 pounds.  Weight is  down 2 pounds from a month ago.  She has no jugular venous distension, thyromegaly, or lymphadenopathy.  Respiratory effort is normal.  Lungs are clear to auscultation.  She has a nondisplaced point of maximal impulse.  There is a regular  rate and rhythm without murmur, rub, or gallop.  ABDOMEN:  Soft, nondistended, nontender.  There is no  hepatosplenomegaly.  Bowel sounds normal.  EXTREMITIES:  Warm without clubbing, cyanosis, edema, or ulceration.  Carotid pulses are 2+ bilaterally with a bruit on the right.   IMPRESSION/RECOMMENDATIONS:  1. Coronary artery disease status post anterior myocardial  infarction.      Asymptomatic.  Ejection fraction is normal.  Continue aspirin and      Plavix indefinitely due to drug-eluting stent in the proximal left      anterior descending.  Continue Coreg and ACE inhibitor at present      dose as her blood pressure was 118 at cardiac rehab.  Will monitor      blood pressures frequently through cardiac rehab and adjust      medications if necessary.  2. Hypertension.  As per #1.  3. Gastroesophageal reflux disease:  Per Dr. Izola Price.  4. Hypercholesterolemia:  Per Dr. Izola Price.  Goal LDL less than 70.  5. Carotid bruit.  Will check ultrasound.  6. Disposition.  Follow up with me in 2 months for titration of      medications.     Salvadore Farber, MD  Electronically Signed    WED/MedQ  DD: 12/17/2006  DT: 12/18/2006  Job #: [id number]   cc:   Dr. Izola Price

## 2011-02-25 NOTE — Assessment & Plan Note (Signed)
[facility]                            CARDIOLOGY OFFICE NOTE   NAME:[name] [name]                         MRN:          [id number]  DATE:11/26/2006                            DOB:          1933/12/15    PRIMARY CARE PHYSICIAN:  Dr. Izola Price in [place].   HISTORY OF PRESENT ILLNESS:  [name] [name] is a 77 year old lady presented  with anterior myocardial infarction on October 19, 2006.  I placed a  TAXUS drug-eluting stent in the proximal LAD.  She had 70% stenosis in a  marginal which we are managing medically.  Ejection fraction was  initially 35%.  However, echo done two days ago demonstrates improvement  to 60% with only mild hypokinesis of the posterolateral wall.  There  were no significant valvular abnormalities.  [name] [name] has recovered  nicely.  She is not having chest discomfort concerning for recurrent ischemia.  She is having no exertional dyspnea, PND, orthopnea, edema,  palpitations, or syncope.  She has had some episodes of burning  substernal chest discomfort lasting less than a second, mostly occurring  when lying down.  She thinks this is due to her reflux.   CURRENT MEDICATIONS:  1. Metoprolol 50 mg twice per day.  2. Omeprazole 20 mg twice per day.  3. Lovastatin 80 mg per day.  4. K-Dur 20 mEq per day.  5. Hydrochlorothiazide 25 mg daily.  6. Levothyroxine 100 mcg per day.  7. Enalapril 20 mg twice per day.  8. Plavix 75 mg per day.  9. Lipitor 80 mg per day.  10.Carvedilol 6.25 mg twice per day.   PHYSICAL EXAMINATION:  GENERAL:  She is generally well-appearing in no  distress.  VITAL SIGNS:  Heart rate 59, blood pressure 160/78, weight 148 pounds.  NECK:  She has no jugular venous distention, thyromegaly, or  lymphadenopathy.  LUNGS:  Clear to auscultation.  HEART:  She has a nondisplaced point of maximal cardiac impulse.  There  is a regular rate and rhythm without murmur, rub, or gallop.  ABDOMEN:  Soft,  nondistended, nontender.  There is no  hepatosplenomegaly.  Bowel sounds are normal.  EXTREMITIES:  Warm without clubbing, cyanosis, edema, or ulceration.   Electrocardiogram demonstrates sinus rhythm versus ectopic atrial rhythm  at 59 beats per minute, evolving anterior MI.   IMPRESSION/RECOMMENDATION:  1. Anterior myocardial infarction five weeks ago, recovering nicely.      Ejection fraction has normalized.  Continue aspirin and Plavix      indefinitely due to drug-eluting stent in the proximal left      anterior descending artery.  We will increase carvedilol to 12.5 mg      daily and continue ACE inhibitor at present dose.  2. Hypertension.  Increase carvedilol.  Follow with me in three weeks      for probable further up titration.  3. Gastroesophageal reflux disease.  Recommend followup with Dr.      Izola Price.  4. Hypercholesterolemia.  Continue Lipitor.     Salvadore Farber, MD  Electronically Signed    WED/MedQ  DD: 11/26/2006  DT: 11/26/2006  Job #: [id number]

## 2011-02-25 NOTE — Cardiovascular Report (Signed)
[name] [name], [name] [name]               ACCOUNT NO.:  [id number]   MEDICAL RECORD NO.:  [id number]          PATIENT TYPE:  INP   LOCATION:  2807                         FACILITY:  [facility]   PHYSICIAN:  Salvadore Farber, MD  DATE OF BIRTH:  1933-12-19   DATE OF PROCEDURE:  10/19/2006  DATE OF DISCHARGE:                            CARDIAC CATHETERIZATION   PROCEDURE:  Left heart catheterization, left ventriculography, coronary  angiography, Diver thrombectomy of the proximal LAD,   Dictation ended at this point.      Salvadore Farber, MD  Electronically Signed     WED/MEDQ  D:  10/19/2006  T:  10/19/2006  Job:  [id number]   cc:   Dr. Lenise Arena

## 2011-03-27 ENCOUNTER — Ambulatory Visit: Payer: Medicare Other | Admitting: Cardiology

## 2011-04-21 ENCOUNTER — Other Ambulatory Visit: Payer: Self-pay | Admitting: Cardiology

## 2011-05-01 ENCOUNTER — Encounter: Payer: Self-pay | Admitting: *Deleted

## 2011-05-01 ENCOUNTER — Encounter: Payer: Self-pay | Admitting: Cardiology

## 2011-05-01 ENCOUNTER — Ambulatory Visit (INDEPENDENT_AMBULATORY_CARE_PROVIDER_SITE_OTHER): Payer: Medicare Other | Admitting: Cardiology

## 2011-05-01 DIAGNOSIS — I509 Heart failure, unspecified: Secondary | ICD-10-CM

## 2011-05-01 DIAGNOSIS — R0989 Other specified symptoms and signs involving the circulatory and respiratory systems: Secondary | ICD-10-CM

## 2011-05-01 DIAGNOSIS — I2510 Atherosclerotic heart disease of native coronary artery without angina pectoris: Secondary | ICD-10-CM

## 2011-05-01 DIAGNOSIS — I10 Essential (primary) hypertension: Secondary | ICD-10-CM

## 2011-05-01 DIAGNOSIS — I5032 Chronic diastolic (congestive) heart failure: Secondary | ICD-10-CM

## 2011-05-01 DIAGNOSIS — R0609 Other forms of dyspnea: Secondary | ICD-10-CM

## 2011-05-01 DIAGNOSIS — E785 Hyperlipidemia, unspecified: Secondary | ICD-10-CM

## 2011-05-01 LAB — HEPATIC FUNCTION PANEL
ALT: 16 U/L (ref 0–35)
AST: 19 U/L (ref 0–37)
Albumin: 4.4 g/dL (ref 3.5–5.2)
Alkaline Phosphatase: 83 U/L (ref 39–117)
Bilirubin, Direct: 0.1 mg/dL (ref 0.0–0.3)

## 2011-05-01 LAB — BASIC METABOLIC PANEL
GFR: 92.46 mL/min (ref >60.00)
Potassium: 3.4 mEq/L — ABNORMAL LOW (ref 3.5–5.1)
Sodium: 136 mEq/L (ref 135–145)

## 2011-05-01 LAB — LIPID PANEL
HDL: 49.8 mg/dL (ref >39.00)
LDL Cholesterol: 50 mg/dL (ref 0–99)
Triglycerides: 92 mg/dL (ref 0.0–149.0)
VLDL: 18.4 mg/dL (ref 0.0–40.0)

## 2011-05-01 LAB — BRAIN NATRIURETIC PEPTIDE: Pro B Natriuretic peptide (BNP): 19 pg/mL (ref 0.0–100.0)

## 2011-05-01 NOTE — Assessment & Plan Note (Signed)
No chest pain, no ischemia on 3/11 myoview.  Continue ASA, Plavix, Coreg, ACEI, and statin.

## 2011-05-01 NOTE — Assessment & Plan Note (Signed)
Euvolemic on exam, NYHA class II symptoms.   Continue current Lasix dose and check BMET/BNP.

## 2011-05-01 NOTE — Assessment & Plan Note (Signed)
Check lipids/LFTs, goal LDL < 70.  

## 2011-05-01 NOTE — Patient Instructions (Signed)
Fasting lab today--lipid profile/liver profile/BMP/BNP 414.01 428.32.  Schedule an appointment to see Dr Shirlee Latch in 4 months.

## 2011-05-01 NOTE — Assessment & Plan Note (Signed)
BP appears to be under reasonable control.  It is borderline elevated but she has not taken her morning meds yet.

## 2011-05-01 NOTE — Progress Notes (Signed)
PCP: Dr. Illene Regulus  77 yo with history of CAD s/p PCI returns for cardiology followup today.  She initially had depressed LV EF after her MI but this rose back to normal range on echo in 2009.  She had an echo done in 3/11 showing preserved LV systolic function and mild diastolic dysfunction.  ETT-myoview was done in 3/11 with a hypertensive BP response on the treadmill but no evidence for ischemia or infarction on images.  At last appointment, she had increased exertional dyspnea and I increased her Lasix.  She has lost about 5 lbs and is breathing better.  She can walk around her house without dyspnea now and can walk up to about 1/2 mile before she gets winded.  No chest pain.  BP is 140/78 but she has not taken any of her medications yet.  When her lipids were last checked, she was not taking Crestor regularly.  She says she is now taking it every day.    Labs (4/11): LDL 61, HDL 42, LFTs normal Labs (1/61): K 4.5, creatinine 0.7, BNP 40 Labs (10/11): K 4.1, creatinine 0.7, LDL 69, HDL 41 Labs (4/12): K 4.4, creatinine 0.8, LDL 110, HDL 50   Allergies (verified):  No Known Drug Allergies  Past Medical History: 1. Coronary artery disease.  The patient had an acute anterior MI in March 2008.  She had 90% LAD stenosis with thrombus.  She did have 2.75 x 12 Taxus drug-eluting stent placed in the proximal LAD.  She did also have 80% first obtuse marginal stenosis, 70% distal circumflex stenosis and 50% proximal RCA stenosis.  Her EF was initially 30-35% at the time of her MI; however, it rose after primary PCI to 60%.  ETT-myoview (3/11): EF 68%, hypertensive BP response, normal perfusion at rest and stress (negative study).  2. Hypertension. 3. Hypercholesterolemia. 4. Gastroesophageal reflux disease. 5. Mild carotid stenosis less than 50% bilaterally. 6. Diastolic CHF: Echo (3/11) with EF 60-65%, mild diastolic dysfunction, mild MR, normal RV.   Family History: Noncontributory.  Social  History: The patient lives alone in an apartment in [place].  She does not smoke or drink alcohol.  Review of Systems        All systems reviewed and negative except as per HPI.   Current Outpatient Prescriptions  Medication Sig Dispense Refill  . acetaminophen (TYLENOL) 650 MG CR tablet Take 650 mg by mouth every 8 (eight) hours as needed.        Marland Kitchen amLODipine (NORVASC) 10 MG tablet Take 1 tablet (10 mg total) by mouth daily.  30 tablet  6  . aspirin 81 MG tablet Take 81 mg by mouth daily.        . carvedilol (COREG) 25 MG tablet 25 mg. Take 1 tablet by mouth two times a day      . CRESTOR 40 MG tablet TAKE 1 TABLET BY MOUTH   ONCE DAILY.  30 each  6  . enalapril (VASOTEC) 20 MG tablet Take 20 mg by mouth 2 (two) times daily.        . furosemide (LASIX) 40 MG tablet Take 1 tablet in the morning and 1/2 tablet in the afternoon  45 tablet  6  . hydrochlorothiazide 25 MG tablet Take 25 mg by mouth daily.        Marland Kitchen levothyroxine (SYNTHROID, LEVOTHROID) 100 MCG tablet Take 100 mcg by mouth daily.        . naproxen (NAPROSYN) 250 MG tablet Take 250 mg  by mouth 2 (two) times daily with a meal.        . PLAVIX 75 MG tablet TAKE 1 TABLET BY MOUTH   ONCE DAILY.  30 each  6  . potassium chloride (KLOR-CON) 8 MEQ CR tablet Take 2 tablets in the morning and 1 tablet in the afternoon  90 tablet  6  . traMADol (ULTRAM) 50 MG tablet Take 50 mg by mouth 2 (two) times daily as needed.          BP 140/78  Pulse 70  Resp 16  Ht 4\' 9"  (1.448 m)  Wt 146 lb (66.225 kg)  BMI 31.59 kg/m2 General:  Well developed, well nourished, in no acute distress. Neck:  Neck supple, no JVD. No masses, thyromegaly or abnormal cervical nodes. Lungs:  Clear bilaterally to auscultation and percussion. Heart:  Non-displaced PMI, chest non-tender; regular rate and rhythm, S1, S2.  +S4. 2/6 early systolic ejection-type murmur.  Carotid upstroke normal, no bruit. Pedals normal pulses. No edema.  Abdomen:  Bowel sounds  positive; abdomen soft and non-tender without masses, organomegaly, or hernias noted. No hepatosplenomegaly. Extremities:  No clubbing or cyanosis. Neurologic:  Alert and oriented x 3. Psych:  Normal affect.

## 2011-08-21 ENCOUNTER — Ambulatory Visit (INDEPENDENT_AMBULATORY_CARE_PROVIDER_SITE_OTHER): Payer: Medicare Other | Admitting: Cardiology

## 2011-08-21 ENCOUNTER — Encounter: Payer: Self-pay | Admitting: Cardiology

## 2011-08-21 ENCOUNTER — Telehealth: Payer: Self-pay | Admitting: *Deleted

## 2011-08-21 DIAGNOSIS — I509 Heart failure, unspecified: Secondary | ICD-10-CM

## 2011-08-21 DIAGNOSIS — I2510 Atherosclerotic heart disease of native coronary artery without angina pectoris: Secondary | ICD-10-CM

## 2011-08-21 DIAGNOSIS — M25559 Pain in unspecified hip: Secondary | ICD-10-CM

## 2011-08-21 DIAGNOSIS — I10 Essential (primary) hypertension: Secondary | ICD-10-CM

## 2011-08-21 DIAGNOSIS — E785 Hyperlipidemia, unspecified: Secondary | ICD-10-CM

## 2011-08-21 DIAGNOSIS — I5032 Chronic diastolic (congestive) heart failure: Secondary | ICD-10-CM

## 2011-08-21 MED ORDER — SPIRONOLACTONE 25 MG PO TABS: ORAL_TABLET | ORAL | Status: DC

## 2011-08-21 NOTE — Patient Instructions (Signed)
Start spironolactone 12.5mg  daily. This will be one-half of a 25mg  tablet daily.  Fasting lab in 2weeks--lipid profile/BMET/BNP 401.9  414.01--you have the order. Please fax the results to Dr Shirlee Latch at [phone].  Schedule an appointment with Dr Dion Saucier at [facility] to see about your right hip.  Your physician recommends that you schedule a follow-up appointment in: 4 months with Dr Shirlee Latch.

## 2011-08-21 NOTE — Telephone Encounter (Signed)
[name] [name] - appt More Detail >>      appt      [name] [name]        Sent: Thu August 21, 2011 10:28 AM    To: Jacqlyn Krauss, RN        [name] [name]    MRN: [id number] DOB: 03-29-1934     Pt Home: [phone]               Message     08-25-11 @ 10:30 with Dr. Dion Saucier

## 2011-08-24 DIAGNOSIS — M25559 Pain in unspecified hip: Secondary | ICD-10-CM | POA: Insufficient documentation

## 2011-08-24 NOTE — Progress Notes (Signed)
PCP: Dr. Illene Regulus  77 yo with history of CAD s/p PCI returns for cardiology followup today.  She initially had depressed LV EF after her MI but this rose back to normal range on echo in 2009.  She had an echo done in 3/11 showing preserved LV systolic function and mild diastolic dysfunction.  ETT-myoview was done in 3/11 with a hypertensive BP response on the treadmill but no evidence for ischemia or infarction on images.  Patient has had trouble with difficult-to-control blood pressure.  Her BP is high today, 171/92, but she has not taken any of her medications today yet.  She does tell me that her SBP tends to run > 140 at home even after she has taken her medications.  She has stable exertional dyspnea with housework or after walking up a flight of steps.  No chest pain.  Weight is up 2 lbs since last appointment.   She has painful right hip osteoarthritis and has been told she needs a hip replacement.  She was sent to an orthopedist in [place] but would rather come to [place].  She asked me today to set her up with an orthopedic surgeon here.   Labs (4/11): LDL 61, HDL 42, LFTs normal Labs (3/08): K 4.5, creatinine 0.7, BNP 40 Labs (10/11): K 4.1, creatinine 0.7, LDL 69, HDL 41 Labs (4/12): K 4.4, creatinine 0.8, LDL 110, HDL 50 Labs (7/12): K 3.4, creatinine 0.8, BNP 19, LDL 50, HDL 50  Allergies (verified):  No Known Drug Allergies  Past Medical History: 1. Coronary artery disease.  The patient had an acute anterior MI in March 2008.  She had 90% LAD stenosis with thrombus.  She did have 2.75 x 12 Taxus drug-eluting stent placed in the proximal LAD.  She did also have 80% first obtuse marginal stenosis, 70% distal circumflex stenosis and 50% proximal RCA stenosis.  Her EF was initially 30-35% at the time of her MI; however, it rose after primary PCI to 60%.  ETT-myoview (3/11): EF 68%, hypertensive BP response, normal perfusion at rest and stress (negative study).  2. Hypertension. 3.  Hypercholesterolemia. 4. Gastroesophageal reflux disease. 5. Mild carotid stenosis less than 50% bilaterally. 6. Diastolic CHF: Echo (3/11) with EF 60-65%, mild diastolic dysfunction, mild MR, normal RV.  7. Osteoarthritis  Family History: Noncontributory.  Social History: The patient lives alone in an apartment in [place].  She does not smoke or drink alcohol.  Review of Systems        All systems reviewed and negative except as per HPI.   Current Outpatient Prescriptions  Medication Sig Dispense Refill  . acetaminophen (TYLENOL) 650 MG CR tablet Take 650 mg by mouth every 8 (eight) hours as needed.        Marland Kitchen amLODipine (NORVASC) 10 MG tablet Take 1 tablet (10 mg total) by mouth daily.  30 tablet  6  . aspirin 81 MG tablet Take 81 mg by mouth daily.        . carvedilol (COREG) 25 MG tablet 25 mg. Take 1 tablet by mouth two times a day      . CRESTOR 40 MG tablet TAKE 1 TABLET BY MOUTH   ONCE DAILY.  30 each  6  . enalapril (VASOTEC) 20 MG tablet Take 20 mg by mouth 2 (two) times daily.        . furosemide (LASIX) 40 MG tablet Take 1 tablet in the morning and 1/2 tablet in the afternoon  45 tablet  6  .  hydrochlorothiazide 25 MG tablet Take 25 mg by mouth daily.        Marland Kitchen levothyroxine (SYNTHROID, LEVOTHROID) 100 MCG tablet Take 100 mcg by mouth daily.        . naproxen (NAPROSYN) 250 MG tablet Take 250 mg by mouth 2 (two) times daily with a meal.        . PLAVIX 75 MG tablet TAKE 1 TABLET BY MOUTH   ONCE DAILY.  30 each  6  . potassium chloride (KLOR-CON) 8 MEQ CR tablet Take 2 tablets in the morning and 1 tablet in the afternoon  90 tablet  6  . traMADol (ULTRAM) 50 MG tablet Take 50 mg by mouth 2 (two) times daily as needed.        Marland Kitchen spironolactone (ALDACTONE) 25 MG tablet Take 1/2 tablet daily  15 tablet  6    BP 171/92  Pulse 81  Ht 4\' 10"  (1.473 m)  Wt 67.132 kg (148 lb)  BMI 30.93 kg/m2 General:  Well developed, well nourished, in no acute distress. Neck:  Neck  supple, no JVD. No masses, thyromegaly or abnormal cervical nodes. Lungs:  Clear bilaterally to auscultation and percussion. Heart:  Non-displaced PMI, chest non-tender; regular rate and rhythm, S1, S2.  +S4. 2/6 early systolic ejection-type murmur.  Carotid upstroke normal, no bruit. Pedals normal pulses. No edema.  Abdomen:  Bowel sounds positive; abdomen soft and non-tender without masses, organomegaly, or hernias noted. No hepatosplenomegaly. Extremities:  No clubbing or cyanosis. Neurologic:  Alert and oriented x 3. Psych:  Normal affect.

## 2011-08-24 NOTE — Assessment & Plan Note (Signed)
Resistant HTN.  BP continues to run too high.  I am going to start spironolactone 12.5 mg daily as this medications often helps in the setting of resistant hypertension (often aldosterone-dependent).  I will get a BMET in 2 wks.

## 2011-08-24 NOTE — Assessment & Plan Note (Signed)
Right hip pain, THR has been recommended.  She wants to see an orthopedic surgeon in [place] and asks for a referral, which I will give her.  She is symptomatically stable and able to walk up a flight of steps.  No ischemia symptoms.  I think that she could undergo THR.

## 2011-08-24 NOTE — Assessment & Plan Note (Signed)
Euvolemic on exam.  Stable NYHA class II-III symptoms.  Continue current dose of Lasix.  Will check BMET/BNP today.

## 2011-08-24 NOTE — Assessment & Plan Note (Signed)
I will check lipids today, goal LDL < 70.

## 2011-08-24 NOTE — Assessment & Plan Note (Signed)
No chest pain, no ischemia on 3/11 myoview.  Continue ASA, Plavix, Coreg, ACEI, and statin. 

## 2011-08-26 ENCOUNTER — Other Ambulatory Visit: Payer: Self-pay | Admitting: Orthopedic Surgery

## 2011-08-26 ENCOUNTER — Telehealth: Payer: Self-pay | Admitting: Cardiology

## 2011-08-26 NOTE — Telephone Encounter (Signed)
I talked with Lynden Ang at Dr Shelba Flake office. Dr Dion Saucier is requesting Dr Alford Highland recommendations for holding plavix and aspirin prior to hip surgery scheduled for 09/23/11. I will forward to Dr Shirlee Latch for review.

## 2011-08-26 NOTE — Telephone Encounter (Signed)
OK to hold Plavix 5 days prior to surgery, ASA 3 days prior to surgery.  Restart afterwards.

## 2011-08-26 NOTE — Telephone Encounter (Signed)
NP:  Per Olegario Messier, please address stop plavix & asa prior to surgery. surgery is 12/18.

## 2011-08-29 NOTE — Telephone Encounter (Signed)
Dr Shelba Flake office closed today.

## 2011-09-01 NOTE — Telephone Encounter (Signed)
This information has been faxed to Dr Nelson Chimes.

## 2011-09-01 NOTE — Telephone Encounter (Signed)
I talked with Olegario Messier at Dr Shelba Flake office.

## 2011-09-08 ENCOUNTER — Encounter: Payer: Self-pay | Admitting: Cardiology

## 2011-09-12 ENCOUNTER — Encounter (HOSPITAL_COMMUNITY): Payer: Self-pay | Admitting: Pharmacy Technician

## 2011-09-15 ENCOUNTER — Telehealth: Payer: Self-pay | Admitting: Cardiology

## 2011-09-15 NOTE — Telephone Encounter (Signed)
LMTCB

## 2011-09-15 NOTE — Telephone Encounter (Signed)
Pt calling re bloodwork results.

## 2011-09-15 NOTE — Telephone Encounter (Signed)
Answering machine. 

## 2011-09-17 ENCOUNTER — Other Ambulatory Visit (HOSPITAL_COMMUNITY): Payer: Medicare Other

## 2011-09-17 NOTE — Telephone Encounter (Signed)
Answering machine. 

## 2011-09-22 NOTE — Telephone Encounter (Signed)
Answering machine. 

## 2011-09-23 ENCOUNTER — Encounter (HOSPITAL_COMMUNITY): Admission: RE | Payer: Self-pay | Source: Ambulatory Visit

## 2011-09-23 ENCOUNTER — Inpatient Hospital Stay (HOSPITAL_COMMUNITY)
Admission: RE | Admit: 2011-09-23 | Payer: PRIVATE HEALTH INSURANCE | Source: Ambulatory Visit | Admitting: Orthopedic Surgery

## 2011-09-23 SURGERY — ARTHROPLASTY, HIP, TOTAL,POSTERIOR APPROACH
Anesthesia: General | Laterality: Right

## 2011-10-15 ENCOUNTER — Encounter (HOSPITAL_COMMUNITY): Payer: Self-pay | Admitting: Respiratory Therapy

## 2011-10-21 NOTE — Pre-Procedure Instructions (Signed)
20 [name] [name]  10/21/2011   Your procedure is scheduled on:  Mon, Jan 21 @ 1250  Report to [facility] at 1050 AM.  Call this number if you have problems the morning of surgery: [phone]   Remember:   Do not eat food:After Midnight.  May have clear liquids: up to 4 Hours before arrival.(until 6:45 am)  Clear liquids include soda, tea, black coffee, apple or grape juice, broth.  Take these medicines the morning of surgery with A SIP OF WATER: Amlodipine,Carvedilol,Synthroid,and Tramadol(if needed)   Do not wear jewelry, make-up or nail polish.  Do not wear lotions, powders, or perfumes. You may wear deodorant.  Do not shave 48 hours prior to surgery.  Do not bring valuables to the hospital.  Contacts, dentures or bridgework may not be worn into surgery.  Leave suitcase in the car. After surgery it may be brought to your room.  For patients admitted to the hospital, checkout time is 11:00 AM the day of discharge.   Patients discharged the day of surgery will not be allowed to drive home.  Name and phone number of your driver:   Special Instructions: CHG Shower Use Special Wash: 1/2 bottle night before surgery and 1/2 bottle morning of surgery.   Please read over the following fact sheets that you were given: Pain Booklet, Coughing and Deep Breathing, Blood Transfusion Information, Total Joint Packet, MRSA Information and Surgical Site Infection Prevention

## 2011-10-21 NOTE — Progress Notes (Signed)
Spoke with Dr.Landau's nurse regarding order for clip/prep 10" above and below knee for a Hip replacement;Spoke with Apolinar Junes and will address the order set with Dr.Landau to make sure of this order

## 2011-10-22 ENCOUNTER — Ambulatory Visit (HOSPITAL_COMMUNITY)
Admission: RE | Admit: 2011-10-22 | Discharge: 2011-10-22 | Disposition: A | Discharge status: Home / Self Care | Payer: PRIVATE HEALTH INSURANCE | Source: Ambulatory Visit | Attending: Orthopedic Surgery | Admitting: Orthopedic Surgery

## 2011-10-22 ENCOUNTER — Encounter (HOSPITAL_COMMUNITY)
Admission: RE | Admit: 2011-10-22 | Discharge: 2011-10-22 | Disposition: A | Discharge status: Home / Self Care | Payer: PRIVATE HEALTH INSURANCE | Source: Ambulatory Visit | Attending: Orthopedic Surgery | Admitting: Orthopedic Surgery

## 2011-10-22 ENCOUNTER — Encounter (HOSPITAL_COMMUNITY): Payer: Self-pay

## 2011-10-22 DIAGNOSIS — Z01818 Encounter for other preprocedural examination: Secondary | ICD-10-CM | POA: Insufficient documentation

## 2011-10-22 DIAGNOSIS — I10 Essential (primary) hypertension: Secondary | ICD-10-CM | POA: Insufficient documentation

## 2011-10-22 DIAGNOSIS — R059 Cough, unspecified: Secondary | ICD-10-CM | POA: Insufficient documentation

## 2011-10-22 DIAGNOSIS — R05 Cough: Secondary | ICD-10-CM | POA: Insufficient documentation

## 2011-10-22 DIAGNOSIS — Z01812 Encounter for preprocedural laboratory examination: Secondary | ICD-10-CM | POA: Insufficient documentation

## 2011-10-22 DIAGNOSIS — M899 Disorder of bone, unspecified: Secondary | ICD-10-CM | POA: Insufficient documentation

## 2011-10-22 HISTORY — DX: Bronchitis, not specified as acute or chronic: J40

## 2011-10-22 HISTORY — DX: Unspecified osteoarthritis, unspecified site: M19.90

## 2011-10-22 HISTORY — DX: Hypothyroidism, unspecified: E03.9

## 2011-10-22 HISTORY — DX: Other specified cough: R05.8

## 2011-10-22 HISTORY — DX: Headache: R51

## 2011-10-22 HISTORY — DX: Effusion, unspecified ankle: M25.473

## 2011-10-22 HISTORY — DX: Cough: R05

## 2011-10-22 HISTORY — DX: Nocturia: R35.1

## 2011-10-22 HISTORY — DX: Acute myocardial infarction, unspecified: I21.9

## 2011-10-22 LAB — BASIC METABOLIC PANEL
BUN: 11 mg/dL (ref 6–23)
CO2: 29 mEq/L (ref 19–32)
Calcium: 9.5 mg/dL (ref 8.4–10.5)
Creatinine, Ser: 0.69 mg/dL (ref 0.50–1.10)

## 2011-10-22 LAB — TYPE AND SCREEN
ABO/RH(D): O POS
Unit division: 0
Unit division: 0

## 2011-10-22 LAB — URINALYSIS, ROUTINE W REFLEX MICROSCOPIC
Glucose, UA: NEGATIVE mg/dL
Leukocytes, UA: NEGATIVE
Nitrite: NEGATIVE
Specific Gravity, Urine: 1.013 (ref 1.005–1.030)
pH: 5.5 (ref 5.0–8.0)

## 2011-10-22 LAB — CBC
MCHC: 32.5 g/dL (ref 30.0–36.0)
MCV: 93.9 fL (ref 78.0–100.0)
Platelets: 290 10*3/uL (ref 150–400)
RDW: 14 % (ref 11.5–15.5)
WBC: 5.8 10*3/uL (ref 4.0–10.5)

## 2011-10-22 LAB — SURGICAL PCR SCREEN
MRSA, PCR: NEGATIVE
Staphylococcus aureus: NEGATIVE

## 2011-10-22 LAB — URINE MICROSCOPIC-ADD ON

## 2011-10-22 LAB — APTT: aPTT: 31 seconds (ref 24–37)

## 2011-10-22 MED ORDER — CHLORHEXIDINE GLUCONATE 4 % EX LIQD: 60.0000 mL | Freq: Once | CUTANEOUS | Status: DC

## 2011-10-22 NOTE — Progress Notes (Addendum)
Dr.McLean is cardiologist-last visit 2months ago  Echo in epic 05/14/10 Heart cath in epic 3/08 Stress test in echo 12/2009

## 2011-10-23 NOTE — Consult Note (Signed)
Anesthesia:  Patient is a 78 year old female scheduled for a right THR on 10/27/11.  Her history includes CAD/MI '08 s/p DES LAD, GERD, hypercholesterolemia, HTN, mild carotid stenosis < 50% per 08/24/11 Cardiology note, hypothyroidism, DM2 diet controlled, and cataracts.  Of note, history suggests she had a CABG, but this is not mentioned in Dr. Alford Highland note, and I can't find any evidence of this.  Cardiologist Dr. Shirlee Latch saw her on 08/21/11 and felt she was acceptable for THR.  EKG from 08/21/11 showed NSR, ST abnormality, consider inferior ischemia.  Her last stress on 12/18/09 was normal with an EF of 68%.  Echo on 08/03/08 showed: - Overall left ventricular systolic function was normal. Left ventricular ejection fraction was estimated , range being 60 % to 65 %. There were no left ventricular regional wall motion abnormalities. There was mild focal basal septal hypertrophy.  Her last cath was on 02/21/11 with the below findings: 1. LV: 143/10/13. EF 35% with anterolateral, apical, and distal inferior akinesis.  2. No aortic stenosis or mitral regurgitation.  3. Left main: Angiographically normal.  4. LAD: A very large vessel which wraps the apex of the heart to supply the majority of the inferior wall. There was a 99% thrombotic stenosis of the proximal vessel which was treated with aspiration thrombectomy and placement of a drug-eluting stent, reducing the stenosis to 0% and improving flow from TIMI-2 to TIMI- 3.  5. Circumflex: Moderate-size vessel giving rise to two marginals. The first marginal has an 80% stenosis proximally. This vessel was small. The small distal AV groove portion of the circumflex has a 70% stenosis.  6 RCA: Small though technically dominant vessel with long 50% stenosis proximally.  IMPRESSION AND RECOMMENDATIONS: Successful percutaneous revascularization of the culprit lesion in the proximal left anterior descending (LAD) using aspiration thrombectomy and a  drug-eluting stent.   CXR showed minimal hyperinflation configuration with central peribronchial thickening. No peripheral infiltrate or consolidation is evident.  Labs acceptable.  Plan to proceed.

## 2011-10-26 MED ORDER — CEFAZOLIN SODIUM 1-5 GM-% IV SOLN
1.0000 g | INTRAVENOUS | Status: AC
  Administered 2011-10-27: 1 g via INTRAVENOUS
  Filled 2011-10-26: qty 50

## 2011-10-27 ENCOUNTER — Encounter (HOSPITAL_COMMUNITY)
Admission: RE | Disposition: A | Discharge status: Skilled Nursing Facility | Payer: Self-pay | Source: Ambulatory Visit | Attending: Orthopedic Surgery

## 2011-10-27 ENCOUNTER — Encounter (HOSPITAL_COMMUNITY): Payer: Self-pay | Admitting: Vascular Surgery

## 2011-10-27 ENCOUNTER — Encounter (HOSPITAL_COMMUNITY): Payer: Self-pay | Admitting: *Deleted

## 2011-10-27 ENCOUNTER — Inpatient Hospital Stay (HOSPITAL_COMMUNITY)
Admission: RE | Admit: 2011-10-27 | Discharge: 2011-11-03 | DRG: 470 | Disposition: A | Discharge status: Skilled Nursing Facility | Payer: PRIVATE HEALTH INSURANCE | Source: Ambulatory Visit | Attending: Orthopedic Surgery | Admitting: Orthopedic Surgery

## 2011-10-27 ENCOUNTER — Ambulatory Visit (HOSPITAL_COMMUNITY): Payer: PRIVATE HEALTH INSURANCE

## 2011-10-27 ENCOUNTER — Encounter (HOSPITAL_COMMUNITY): Payer: Self-pay | Admitting: Orthopedic Surgery

## 2011-10-27 ENCOUNTER — Ambulatory Visit (HOSPITAL_COMMUNITY): Payer: PRIVATE HEALTH INSURANCE | Admitting: Vascular Surgery

## 2011-10-27 DIAGNOSIS — E039 Hypothyroidism, unspecified: Secondary | ICD-10-CM | POA: Diagnosis present

## 2011-10-27 DIAGNOSIS — N289 Disorder of kidney and ureter, unspecified: Secondary | ICD-10-CM | POA: Diagnosis not present

## 2011-10-27 DIAGNOSIS — I10 Essential (primary) hypertension: Secondary | ICD-10-CM | POA: Diagnosis present

## 2011-10-27 DIAGNOSIS — I509 Heart failure, unspecified: Secondary | ICD-10-CM | POA: Diagnosis present

## 2011-10-27 DIAGNOSIS — I2510 Atherosclerotic heart disease of native coronary artery without angina pectoris: Secondary | ICD-10-CM | POA: Diagnosis present

## 2011-10-27 DIAGNOSIS — M1610 Unilateral primary osteoarthritis, unspecified hip: Principal | ICD-10-CM | POA: Diagnosis present

## 2011-10-27 DIAGNOSIS — Z90710 Acquired absence of both cervix and uterus: Secondary | ICD-10-CM

## 2011-10-27 DIAGNOSIS — M1611 Unilateral primary osteoarthritis, right hip: Secondary | ICD-10-CM | POA: Diagnosis present

## 2011-10-27 DIAGNOSIS — E119 Type 2 diabetes mellitus without complications: Secondary | ICD-10-CM | POA: Diagnosis present

## 2011-10-27 DIAGNOSIS — K219 Gastro-esophageal reflux disease without esophagitis: Secondary | ICD-10-CM | POA: Diagnosis present

## 2011-10-27 DIAGNOSIS — I252 Old myocardial infarction: Secondary | ICD-10-CM

## 2011-10-27 DIAGNOSIS — Z951 Presence of aortocoronary bypass graft: Secondary | ICD-10-CM

## 2011-10-27 DIAGNOSIS — I5032 Chronic diastolic (congestive) heart failure: Secondary | ICD-10-CM | POA: Diagnosis present

## 2011-10-27 DIAGNOSIS — M169 Osteoarthritis of hip, unspecified: Principal | ICD-10-CM | POA: Diagnosis present

## 2011-10-27 DIAGNOSIS — E871 Hypo-osmolality and hyponatremia: Secondary | ICD-10-CM | POA: Diagnosis not present

## 2011-10-27 DIAGNOSIS — Z23 Encounter for immunization: Secondary | ICD-10-CM

## 2011-10-27 DIAGNOSIS — E782 Mixed hyperlipidemia: Secondary | ICD-10-CM | POA: Diagnosis present

## 2011-10-27 DIAGNOSIS — H269 Unspecified cataract: Secondary | ICD-10-CM | POA: Diagnosis present

## 2011-10-27 DIAGNOSIS — D62 Acute posthemorrhagic anemia: Secondary | ICD-10-CM | POA: Diagnosis not present

## 2011-10-27 DIAGNOSIS — K5900 Constipation, unspecified: Secondary | ICD-10-CM | POA: Diagnosis present

## 2011-10-27 HISTORY — DX: Unilateral primary osteoarthritis, right hip: M16.11

## 2011-10-27 HISTORY — PX: TOTAL HIP ARTHROPLASTY: SHX124

## 2011-10-27 SURGERY — ARTHROPLASTY, HIP, TOTAL,POSTERIOR APPROACH
Anesthesia: General | Site: Hip | Laterality: Right | Wound class: Clean

## 2011-10-27 MED ORDER — SPIRONOLACTONE 25 MG PO TABS
25.0000 mg | ORAL_TABLET | Freq: Every day | ORAL | Status: DC
  Administered 2011-10-27 – 2011-11-03 (×5): 25 mg via ORAL
  Filled 2011-10-27 (×8): qty 1

## 2011-10-27 MED ORDER — POTASSIUM CHLORIDE ER 8 MEQ PO TBCR: 8.0000 meq | EXTENDED_RELEASE_TABLET | Freq: Two times a day (BID) | ORAL | Status: DC

## 2011-10-27 MED ORDER — METHOCARBAMOL 500 MG PO TABS
500.0000 mg | ORAL_TABLET | Freq: Four times a day (QID) | ORAL | Status: DC | PRN
  Administered 2011-10-27 – 2011-11-02 (×6): 500 mg via ORAL
  Filled 2011-10-27 (×9): qty 1

## 2011-10-27 MED ORDER — METOCLOPRAMIDE HCL 10 MG PO TABS: 5.0000 mg | ORAL_TABLET | Freq: Three times a day (TID) | ORAL | Status: DC | PRN

## 2011-10-27 MED ORDER — LEVOTHYROXINE SODIUM 100 MCG PO TABS
100.0000 ug | ORAL_TABLET | Freq: Every day | ORAL | Status: DC
  Administered 2011-10-27 – 2011-11-03 (×8): 100 ug via ORAL
  Filled 2011-10-27 (×8): qty 1

## 2011-10-27 MED ORDER — PHENYLEPHRINE HCL 10 MG/ML IJ SOLN
20.0000 mg | INTRAVENOUS | Status: DC | PRN
  Administered 2011-10-27: 10 ug/min via INTRAVENOUS

## 2011-10-27 MED ORDER — ONDANSETRON HCL 4 MG/2ML IJ SOLN
4.0000 mg | Freq: Four times a day (QID) | INTRAMUSCULAR | Status: DC | PRN
  Administered 2011-10-27: 4 mg via INTRAVENOUS
  Filled 2011-10-27: qty 2

## 2011-10-27 MED ORDER — PHENOL 1.4 % MT LIQD
1.0000 | OROMUCOSAL | Status: DC | PRN
  Filled 2011-10-27: qty 177

## 2011-10-27 MED ORDER — COUMADIN BOOK
Freq: Once | Status: AC
  Administered 2011-10-28: 09:00:00
  Filled 2011-10-27: qty 1

## 2011-10-27 MED ORDER — HYDROCODONE-ACETAMINOPHEN 5-325 MG PO TABS: 1.0000 | ORAL_TABLET | Freq: Four times a day (QID) | ORAL | Status: AC | PRN

## 2011-10-27 MED ORDER — ROCURONIUM BROMIDE 100 MG/10ML IV SOLN
INTRAVENOUS | Status: DC | PRN
  Administered 2011-10-27: 40 mg via INTRAVENOUS

## 2011-10-27 MED ORDER — WARFARIN VIDEO: Freq: Once | Status: DC

## 2011-10-27 MED ORDER — ONDANSETRON HCL 4 MG PO TABS: 4.0000 mg | ORAL_TABLET | Freq: Four times a day (QID) | ORAL | Status: DC | PRN

## 2011-10-27 MED ORDER — CARVEDILOL 25 MG PO TABS
25.0000 mg | ORAL_TABLET | Freq: Two times a day (BID) | ORAL | Status: DC
  Administered 2011-10-27 – 2011-11-03 (×10): 25 mg via ORAL
  Filled 2011-10-27 (×16): qty 1

## 2011-10-27 MED ORDER — OXYCODONE HCL 5 MG PO TABS: 5.0000 mg | ORAL_TABLET | ORAL | Status: DC | PRN

## 2011-10-27 MED ORDER — SODIUM CHLORIDE 0.9 % IR SOLN
Status: DC | PRN
  Administered 2011-10-27: 1000 mL

## 2011-10-27 MED ORDER — LACTATED RINGERS IV SOLN
INTRAVENOUS | Status: DC
  Administered 2011-10-27: 13:00:00 via INTRAVENOUS

## 2011-10-27 MED ORDER — MEPERIDINE HCL 25 MG/ML IJ SOLN: 6.2500 mg | INTRAMUSCULAR | Status: DC | PRN

## 2011-10-27 MED ORDER — BUPIVACAINE-EPINEPHRINE 0.5% -1:200000 IJ SOLN
INTRAMUSCULAR | Status: DC | PRN
  Administered 2011-10-27: 10 mL

## 2011-10-27 MED ORDER — ASPIRIN 81 MG PO TABS: 81.0000 mg | ORAL_TABLET | Freq: Every day | ORAL | Status: DC

## 2011-10-27 MED ORDER — ROSUVASTATIN CALCIUM 40 MG PO TABS
40.0000 mg | ORAL_TABLET | Freq: Every day | ORAL | Status: DC
  Administered 2011-10-27 – 2011-11-02 (×7): 40 mg via ORAL
  Filled 2011-10-27 (×8): qty 1

## 2011-10-27 MED ORDER — CLOPIDOGREL BISULFATE 75 MG PO TABS
75.0000 mg | ORAL_TABLET | Freq: Every day | ORAL | Status: DC
  Administered 2011-10-27 – 2011-11-03 (×8): 75 mg via ORAL
  Filled 2011-10-27 (×8): qty 1

## 2011-10-27 MED ORDER — ZOLPIDEM TARTRATE 5 MG PO TABS: 5.0000 mg | ORAL_TABLET | Freq: Every evening | ORAL | Status: DC | PRN

## 2011-10-27 MED ORDER — ASPIRIN EC 81 MG PO TBEC
81.0000 mg | DELAYED_RELEASE_TABLET | Freq: Every day | ORAL | Status: DC
  Administered 2011-10-27 – 2011-11-03 (×5): 81 mg via ORAL
  Filled 2011-10-27 (×8): qty 1

## 2011-10-27 MED ORDER — LACTATED RINGERS IV SOLN
INTRAVENOUS | Status: DC | PRN
  Administered 2011-10-27 (×3): via INTRAVENOUS

## 2011-10-27 MED ORDER — OXYCODONE-ACETAMINOPHEN 5-325 MG PO TABS: 1.0000 | ORAL_TABLET | ORAL | Status: DC | PRN

## 2011-10-27 MED ORDER — FUROSEMIDE 40 MG PO TABS
40.0000 mg | ORAL_TABLET | Freq: Every day | ORAL | Status: DC
  Administered 2011-10-28: 40 mg via ORAL
  Filled 2011-10-27 (×3): qty 1

## 2011-10-27 MED ORDER — HYDROMORPHONE HCL PF 1 MG/ML IJ SOLN
0.5000 mg | INTRAMUSCULAR | Status: DC | PRN
  Administered 2011-10-27 – 2011-10-29 (×3): 1 mg via INTRAVENOUS
  Filled 2011-10-27 (×3): qty 1

## 2011-10-27 MED ORDER — METHOCARBAMOL 500 MG PO TABS: 500.0000 mg | ORAL_TABLET | Freq: Four times a day (QID) | ORAL | Status: AC

## 2011-10-27 MED ORDER — PNEUMOCOCCAL VAC POLYVALENT 25 MCG/0.5ML IJ INJ
0.5000 mL | INJECTION | INTRAMUSCULAR | Status: AC
  Administered 2011-10-28: 0.5 mL via INTRAMUSCULAR
  Filled 2011-10-27: qty 0.5

## 2011-10-27 MED ORDER — MIDAZOLAM HCL 5 MG/5ML IJ SOLN
INTRAMUSCULAR | Status: DC | PRN
  Administered 2011-10-27: 1 mg via INTRAVENOUS

## 2011-10-27 MED ORDER — SUFENTANIL CITRATE 50 MCG/ML IV SOLN
INTRAVENOUS | Status: DC | PRN
  Administered 2011-10-27: 15 ug via INTRAVENOUS
  Administered 2011-10-27 (×2): 10 ug via INTRAVENOUS

## 2011-10-27 MED ORDER — AMLODIPINE BESYLATE 10 MG PO TABS
10.0000 mg | ORAL_TABLET | Freq: Every day | ORAL | Status: DC
  Administered 2011-10-27 – 2011-11-03 (×5): 10 mg via ORAL
  Filled 2011-10-27 (×8): qty 1

## 2011-10-27 MED ORDER — MENTHOL 3 MG MT LOZG: 1.0000 | LOZENGE | OROMUCOSAL | Status: DC | PRN

## 2011-10-27 MED ORDER — NEOSTIGMINE METHYLSULFATE 1 MG/ML IJ SOLN
INTRAMUSCULAR | Status: DC | PRN
  Administered 2011-10-27: 5 mg via INTRAVENOUS

## 2011-10-27 MED ORDER — WARFARIN SODIUM 5 MG PO TABS: 5.0000 mg | ORAL_TABLET | Freq: Every day | ORAL | Status: DC

## 2011-10-27 MED ORDER — ENALAPRIL MALEATE 20 MG PO TABS
20.0000 mg | ORAL_TABLET | Freq: Two times a day (BID) | ORAL | Status: DC
  Administered 2011-10-28: 20 mg via ORAL
  Filled 2011-10-27 (×6): qty 1

## 2011-10-27 MED ORDER — ENOXAPARIN SODIUM 40 MG/0.4ML ~~LOC~~ SOLN
40.0000 mg | SUBCUTANEOUS | Status: DC
  Administered 2011-10-27 – 2011-10-29 (×3): 40 mg via SUBCUTANEOUS
  Filled 2011-10-27 (×5): qty 0.4

## 2011-10-27 MED ORDER — ACETAMINOPHEN 650 MG RE SUPP: 650.0000 mg | Freq: Four times a day (QID) | RECTAL | Status: DC | PRN

## 2011-10-27 MED ORDER — GLYCOPYRROLATE 0.2 MG/ML IJ SOLN
INTRAMUSCULAR | Status: DC | PRN
  Administered 2011-10-27: .7 mg via INTRAVENOUS

## 2011-10-27 MED ORDER — ALUM & MAG HYDROXIDE-SIMETH 200-200-20 MG/5ML PO SUSP
30.0000 mL | ORAL | Status: DC | PRN
  Administered 2011-10-27: 30 mL via ORAL
  Filled 2011-10-27: qty 30

## 2011-10-27 MED ORDER — CEFAZOLIN SODIUM 1-5 GM-% IV SOLN
1.0000 g | Freq: Four times a day (QID) | INTRAVENOUS | Status: AC
  Administered 2011-10-27 – 2011-10-28 (×3): 1 g via INTRAVENOUS
  Filled 2011-10-27 (×4): qty 50

## 2011-10-27 MED ORDER — HYDROCHLOROTHIAZIDE 25 MG PO TABS
25.0000 mg | ORAL_TABLET | Freq: Every day | ORAL | Status: DC
  Administered 2011-10-27 – 2011-11-03 (×5): 25 mg via ORAL
  Filled 2011-10-27 (×8): qty 1

## 2011-10-27 MED ORDER — HYDROCODONE-ACETAMINOPHEN 5-325 MG PO TABS
1.0000 | ORAL_TABLET | Freq: Four times a day (QID) | ORAL | Status: DC | PRN
  Administered 2011-10-27: 2 via ORAL
  Administered 2011-10-28: 1 via ORAL
  Administered 2011-10-28 – 2011-10-30 (×3): 2 via ORAL
  Administered 2011-10-30: 1 via ORAL
  Administered 2011-10-30 – 2011-10-31 (×3): 2 via ORAL
  Administered 2011-11-01 – 2011-11-02 (×4): 1 via ORAL
  Administered 2011-11-02: 2 via ORAL
  Administered 2011-11-03: 1 via ORAL
  Filled 2011-10-27: qty 1
  Filled 2011-10-27: qty 2
  Filled 2011-10-27 (×3): qty 1
  Filled 2011-10-27 (×3): qty 2
  Filled 2011-10-27: qty 1
  Filled 2011-10-27 (×3): qty 2
  Filled 2011-10-27 (×3): qty 1
  Filled 2011-10-27 (×2): qty 2

## 2011-10-27 MED ORDER — PROPOFOL 10 MG/ML IV EMUL
INTRAVENOUS | Status: DC | PRN
  Administered 2011-10-27: 100 mg via INTRAVENOUS

## 2011-10-27 MED ORDER — ONDANSETRON HCL 4 MG/2ML IJ SOLN
INTRAMUSCULAR | Status: DC | PRN
  Administered 2011-10-27: 4 mg via INTRAVENOUS

## 2011-10-27 MED ORDER — DIPHENHYDRAMINE HCL 12.5 MG/5ML PO ELIX
12.5000 mg | ORAL_SOLUTION | ORAL | Status: DC | PRN
  Filled 2011-10-27: qty 10

## 2011-10-27 MED ORDER — POTASSIUM CHLORIDE IN NACL 20-0.45 MEQ/L-% IV SOLN
INTRAVENOUS | Status: DC
  Administered 2011-10-28: 06:00:00 via INTRAVENOUS
  Filled 2011-10-27 (×14): qty 1000

## 2011-10-27 MED ORDER — HYDROMORPHONE HCL PF 1 MG/ML IJ SOLN
INTRAMUSCULAR | Status: AC
  Filled 2011-10-27: qty 1

## 2011-10-27 MED ORDER — ACETAMINOPHEN 325 MG PO TABS
650.0000 mg | ORAL_TABLET | Freq: Four times a day (QID) | ORAL | Status: DC | PRN
  Administered 2011-10-30 – 2011-11-02 (×2): 650 mg via ORAL
  Filled 2011-10-27 (×2): qty 2

## 2011-10-27 MED ORDER — WARFARIN SODIUM 5 MG PO TABS
5.0000 mg | ORAL_TABLET | Freq: Once | ORAL | Status: AC
  Administered 2011-10-27: 5 mg via ORAL
  Filled 2011-10-27: qty 1

## 2011-10-27 MED ORDER — METOCLOPRAMIDE HCL 5 MG/ML IJ SOLN
5.0000 mg | Freq: Three times a day (TID) | INTRAMUSCULAR | Status: DC | PRN
  Filled 2011-10-27: qty 2

## 2011-10-27 MED ORDER — FUROSEMIDE 20 MG PO TABS: 20.0000 mg | ORAL_TABLET | Freq: Two times a day (BID) | ORAL | Status: DC

## 2011-10-27 MED ORDER — FUROSEMIDE 20 MG PO TABS
20.0000 mg | ORAL_TABLET | Freq: Every day | ORAL | Status: DC
  Administered 2011-10-27 – 2011-11-02 (×5): 20 mg via ORAL
  Filled 2011-10-27 (×8): qty 1

## 2011-10-27 MED ORDER — POTASSIUM CHLORIDE CRYS ER 10 MEQ PO TBCR
10.0000 meq | EXTENDED_RELEASE_TABLET | Freq: Every day | ORAL | Status: DC
  Administered 2011-10-27 – 2011-11-02 (×7): 10 meq via ORAL
  Filled 2011-10-27 (×8): qty 1

## 2011-10-27 MED ORDER — HYDROMORPHONE HCL PF 1 MG/ML IJ SOLN
0.2500 mg | INTRAMUSCULAR | Status: DC | PRN
  Administered 2011-10-27 (×4): 0.5 mg via INTRAVENOUS

## 2011-10-27 MED ORDER — PROMETHAZINE HCL 25 MG/ML IJ SOLN: 6.2500 mg | INTRAMUSCULAR | Status: DC | PRN

## 2011-10-27 MED ORDER — METHOCARBAMOL 100 MG/ML IJ SOLN
500.0000 mg | Freq: Four times a day (QID) | INTRAVENOUS | Status: DC | PRN
  Administered 2011-10-27: 500 mg via INTRAVENOUS
  Filled 2011-10-27: qty 5

## 2011-10-27 MED ORDER — POTASSIUM CHLORIDE CRYS ER 20 MEQ PO TBCR
20.0000 meq | EXTENDED_RELEASE_TABLET | Freq: Every day | ORAL | Status: DC
  Administered 2011-10-28 – 2011-11-03 (×7): 20 meq via ORAL
  Filled 2011-10-27 (×8): qty 1

## 2011-10-27 SURGICAL SUPPLY — 60 items
BENZOIN TINCTURE PRP APPL 2/3 (GAUZE/BANDAGES/DRESSINGS) ×2 IMPLANT
BLADE SAW SAG 73X25 THK (BLADE) ×1
BLADE SAW SGTL 73X25 THK (BLADE) ×1 IMPLANT
BRUSH FEMORAL CANAL (MISCELLANEOUS) IMPLANT
CLOTH BEACON ORANGE TIMEOUT ST (SAFETY) ×2 IMPLANT
COVER BACK TABLE 24X17X13 BIG (DRAPES) IMPLANT
COVER SURGICAL LIGHT HANDLE (MISCELLANEOUS) ×2 IMPLANT
DRAPE INCISE IOBAN 66X45 STRL (DRAPES) IMPLANT
DRAPE ORTHO SPLIT 77X108 STRL (DRAPES) ×2
DRAPE PROXIMA HALF (DRAPES) ×2 IMPLANT
DRAPE SURG ORHT 6 SPLT 77X108 (DRAPES) ×2 IMPLANT
DRAPE U-SHAPE 47X51 STRL (DRAPES) ×2 IMPLANT
DRILL BIT 5/64 (BIT) ×2 IMPLANT
DRSG MEPILEX BORDER 4X12 (GAUZE/BANDAGES/DRESSINGS) IMPLANT
DRSG MEPILEX BORDER 4X8 (GAUZE/BANDAGES/DRESSINGS) IMPLANT
DRSG PAD ABDOMINAL 8X10 ST (GAUZE/BANDAGES/DRESSINGS) IMPLANT
DURAPREP 26ML APPLICATOR (WOUND CARE) ×2 IMPLANT
ELECT CAUTERY BLADE 6.4 (BLADE) ×2 IMPLANT
ELECT REM PT RETURN 9FT ADLT (ELECTROSURGICAL) ×2
ELECTRODE REM PT RTRN 9FT ADLT (ELECTROSURGICAL) ×1 IMPLANT
EVACUATOR 1/8 PVC DRAIN (DRAIN) IMPLANT
GLOVE BIOGEL PI IND STRL 8 (GLOVE) ×2 IMPLANT
GLOVE BIOGEL PI INDICATOR 8 (GLOVE) ×2
GLOVE ORTHO TXT STRL SZ7.5 (GLOVE) ×2 IMPLANT
GLOVE SURG ORTHO 8.0 STRL STRW (GLOVE) ×4 IMPLANT
GOWN STRL NON-REIN LRG LVL3 (GOWN DISPOSABLE) IMPLANT
HANDPIECE INTERPULSE COAX TIP (DISPOSABLE)
HOOD PEEL AWAY FACE SHEILD DIS (HOOD) ×2 IMPLANT
KIT BASIN OR (CUSTOM PROCEDURE TRAY) ×2 IMPLANT
KIT ROOM TURNOVER OR (KITS) ×2 IMPLANT
MANIFOLD NEPTUNE II (INSTRUMENTS) ×2 IMPLANT
NEEDLE HYPO 25GX1X1/2 BEV (NEEDLE) ×2 IMPLANT
NS IRRIG 1000ML POUR BTL (IV SOLUTION) ×2 IMPLANT
PACK TOTAL JOINT (CUSTOM PROCEDURE TRAY) ×2 IMPLANT
PAD ARMBOARD 7.5X6 YLW CONV (MISCELLANEOUS) ×4 IMPLANT
PILLOW ABDUCTION HIP (SOFTGOODS) ×2 IMPLANT
PRESSURIZER FEMORAL UNIV (MISCELLANEOUS) IMPLANT
RETRIEVER SUT HEWSON (MISCELLANEOUS) ×2 IMPLANT
SET HNDPC FAN SPRY TIP SCT (DISPOSABLE) IMPLANT
SPONGE GAUZE 4X4 12PLY (GAUZE/BANDAGES/DRESSINGS) IMPLANT
SPONGE LAP 4X18 X RAY DECT (DISPOSABLE) IMPLANT
STRIP CLOSURE SKIN 1/2X4 (GAUZE/BANDAGES/DRESSINGS) ×4 IMPLANT
SUCTION FRAZIER TIP 10 FR DISP (SUCTIONS) ×2 IMPLANT
SUT FIBERWIRE #2 38 REV NDL BL (SUTURE) ×6
SUT FIBERWIRE #2 38 T-5 BLUE (SUTURE)
SUT MNCRL AB 3-0 PS2 18 (SUTURE) ×4 IMPLANT
SUT VIC AB 0 CT1 27 (SUTURE)
SUT VIC AB 0 CT1 27XBRD ANBCTR (SUTURE) IMPLANT
SUT VIC AB 1 CT1 27 (SUTURE) ×2
SUT VIC AB 1 CT1 27XBRD ANBCTR (SUTURE) ×2 IMPLANT
SUT VIC AB 2-0 CT1 27 (SUTURE) ×1
SUT VIC AB 2-0 CT1 TAPERPNT 27 (SUTURE) ×1 IMPLANT
SUTURE FIBERWR #2 38 T-5 BLUE (SUTURE) IMPLANT
SUTURE FIBERWR#2 38 REV NDL BL (SUTURE) ×3 IMPLANT
SYR CONTROL 10ML LL (SYRINGE) ×2 IMPLANT
TOWEL OR 17X24 6PK STRL BLUE (TOWEL DISPOSABLE) ×2 IMPLANT
TOWEL OR 17X26 10 PK STRL BLUE (TOWEL DISPOSABLE) ×2 IMPLANT
TOWER CARTRIDGE SMART MIX (DISPOSABLE) IMPLANT
TRAY FOLEY CATH 14FR (SET/KITS/TRAYS/PACK) ×2 IMPLANT
WATER STERILE IRR 1000ML POUR (IV SOLUTION) ×8 IMPLANT

## 2011-10-27 NOTE — Anesthesia Preprocedure Evaluation (Addendum)
Anesthesia Evaluation  Patient identified by MRN, date of birth, ID band Patient awake    Reviewed: Allergy & Precautions, H&P , NPO status , Patient's Chart, lab work & pertinent test results  Airway Mallampati: III  Neck ROM: Full    Dental  (+) Teeth Intact, Missing, Poor Dentition and Dental Advisory Given   Pulmonary shortness of breath and with exertion,  clear to auscultation        Cardiovascular hypertension, Pt. on medications and Pt. on home beta blockers + CAD and + Past MI + Valvular Problems/Murmurs Regular Normal+ Systolic murmurs    Neuro/Psych  Headaches,    GI/Hepatic GERD-  ,  Endo/Other  Diabetes mellitus-, Poorly Controlled, Type 2Hypothyroidism   Renal/GU      Musculoskeletal   Abdominal (+) obese,   Peds  Hematology negative hematology ROS (+)   Anesthesia Other Findings   Reproductive/Obstetrics                          Anesthesia Physical Anesthesia Plan  ASA: III  Anesthesia Plan: General   Post-op Pain Management:    Induction: Intravenous  Airway Management Planned: Oral ETT  Additional Equipment:   Intra-op Plan:   Post-operative Plan: Extubation in OR  Informed Consent:   Dental advisory given  Plan Discussed with: Surgeon  Anesthesia Plan Comments:         Anesthesia Quick Evaluation

## 2011-10-27 NOTE — Op Note (Signed)
10/27/2011  3:27 PM  PATIENT:  [name] [name]   MRN: [id number]  PRE-OPERATIVE DIAGNOSIS:  Degenerative joint disease RIGHT HIP  POST-OPERATIVE DIAGNOSIS:  degenerative joint disease right hip  PROCEDURE:  Procedure(s): TOTAL HIP ARTHROPLASTY  PREOPERATIVE INDICATIONS:    [name] [name] is an 78 y.o. female who has a diagnosis of Primary osteoarthritis of right hip and elected for surgical management after failing conservative treatment.  The risks benefits and alternatives were discussed with the patient including but not limited to the risks of nonoperative treatment, versus surgical intervention including infection, bleeding, nerve injury, periprosthetic fracture, the need for revision surgery, dislocation, leg length discrepancy, blood clots, cardiopulmonary complications, morbidity, mortality, among others, and they were willing to proceed.  Predicted outcome is good, although there will be at least a six to nine month expected recovery.    OPERATIVE REPORT     SURGEON:  Teryl Lucy, MD    ASSISTANT:  Zonia Kief, PA-C  (Present throughout the entire procedure,  necessary for completion of procedure in a timely manner, assisting with retraction, instrumentation, and closure)     ANESTHESIA:  General    COMPLICATIONS:  None.     COMPONENTS:  [employer] fit femur size 5 with a +1 32 mm head ball and a pinnacle acetabular shell size 50 with a 10 degree lipped polyethylene liner    PROCEDURE IN DETAIL:   The patient was met in the holding area and  identified.  The appropriate hip was identified and marked at the operative site.  The patient was then transported to the OR  and  placed under general l anesthesia.  At that point, the patient was  placed in the lateral decubitus position with the operative side up and  secured to the operating room table and all bony prominences padded.     The operative lower extremity was prepped from the iliac crest to the distal leg.   Sterile draping was performed.  Time out was performed prior to incision.      A routine posterolateral approach was utilized via sharp dissection  carried down to the subcutaneous tissue.  Gross bleeders were Bovie coagulated.  The iliotibial band was identified and incised along the length of the skin incision.  Self-retaining retractors were  inserted.  With the hip internally rotated, the short external rotators  were identified. The piriformis and capsule was tagged with FiberWire, and the hip capsule released in a T-type fashion.  The femoral neck was exposed, and I resected the femoral neck using the appropriate jig. This was performed at approximately a thumb's breadth above the lesser trochanter.    I then exposed the deep acetabulum, cleared out any tissue including the ligamentum teres.  A wing retractor was placed.  After adequate visualization, I excised the labrum, and then sequentially reamed.  I placed the trial acetabulum, which seated nicely, and then impacted the real cup into place.  Appropriate version and inclination was confirmed clinically matching their bony anatomy, and also with the use of the jig. There was a fairly substantial amount of ossified labrum, that was circumferential. This was removed with a rongeur.  A trial polyethylene liner was placed and the wing retractor removed.    I then prepared the proximal femur using the cookie-cutter, the lateralizing reamer, and then sequentially reamed and broached.  A trial broach, neck, and head was utilized, and I reduced the hip and it was found to have excellent stability with functional  range of motion. The trial components were then removed, and the real polyethylene liner was placed with the lip directed posteriorly.  I then impacted the real femoral prosthesis into place into the appropriate version, slightly anteverted to the normal anatomy, and I impacted the real head ball into place. The hip was then reduced and taken  through functional range of motion and found to have excellent stability. Leg lengths were restored. Her hip was fairly stiff, and had excellent stability.  I then used a 2 mm drill bits to pass the FiberWire suture from the capsule and puriform is through the greater trochanter, and secured this. Excellent posterior capsular repair was achieved. I also closed the T in the capsule.  I then irrigated the hip copiously again with pulse lavage, and repaired the fascia with Vicryl, followed by Vicryl for the subcutaneous tissue, Monocryl for the skin, Steri-Strips and sterile gauze. The wounds were injected. The patient was then awakened and returned to PACU in stable and satisfactory condition. No complications.  Teryl Lucy, MD Orthopedic Surgeon [phone]   10/27/2011 3:27 PM

## 2011-10-27 NOTE — Transfer of Care (Signed)
Immediate Anesthesia Transfer of Care Note  Patient: [name] [name]  Procedure(s) Performed:  TOTAL HIP ARTHROPLASTY  Patient Location: PACU  Anesthesia Type: General  Level of Consciousness: awake, oriented and sedated  Airway & Oxygen Therapy: Patient Spontanous Breathing and Patient connected to face mask oxygen  Post-op Assessment: Report given to PACU RN and Post -op Vital signs reviewed and stable  Post vital signs: Reviewed and stable  Complications: No apparent anesthesia complications

## 2011-10-27 NOTE — H&P (Signed)
PREOPERATIVE H&P  Chief Complaint: DJD RIGHT HIP  HPI: [name] [name] is a 78 y.o. female who presents for preoperative history and physical with a diagnosis of DJD RIGHT HIP. Symptoms are rated as moderate to severe, and have been worsening.  This is significantly impairing activities of daily living.  She has elected for surgical management.   Past Medical History  Diagnosis Date  . CAD (coronary artery disease)     acute MI in 3/08 - taxus stent  . Hypercholesteremia     takes Crestor daily  . GERD (gastroesophageal reflux disease)   . Carotid stenosis     mild  . HTN (hypertension)     takes Amlodpine and Carvedilol daily  . Dry cough   . Bronchitis     in Dec  . Ankle edema     pt lasix and hctz daily  . Headache     occasionally  . Arthritis     right hip  . Myocardial infarction     pt unsure but thinks about 11yrs ago;is taking Plavix daily   . Constipation     takes OTC stool softener as needed  . Nocturia   . Hypothyroidism     takes Synthroid daily  . Diabetes mellitus     pt doesn't take any meds for it  . Cataract     left;not ready to come off   Past Surgical History  Procedure Date  . Back surgery 15+yrs ago  . Lymph node dissection 20+yrs ago    right   . Abdominal hysterectomy 20+yrs  . Coronary artery bypass graft   . Cardiac catheterization 2008   History   Social History  . Marital Status: Widowed    Spouse Name: N/A    Number of Children: N/A  . Years of Education: N/A   Social History Main Topics  . Smoking status: Never Smoker   . Smokeless tobacco: None  . Alcohol Use: No  . Drug Use: No  . Sexually Active: No   Other Topics Concern  . None   Social History Narrative  . None   Family History  Problem Relation Age of Onset  . Anesthesia problems Neg Hx   . Hypotension Neg Hx   . Malignant hyperthermia Neg Hx   . Pseudochol deficiency Neg Hx    No Known Allergies Prior to Admission medications   Medication Sig Start  Date End Date Taking? Authorizing Provider  acetaminophen (TYLENOL) 650 MG CR tablet Take 650 mg by mouth every 8 (eight) hours as needed. For pain   Yes Historical Provider, MD  amLODipine (NORVASC) 10 MG tablet Take 10 mg by mouth daily.   Yes Historical Provider, MD  aspirin 81 MG tablet Take 81 mg by mouth daily.     Yes Historical Provider, MD  carvedilol (COREG) 25 MG tablet 25 mg. Take 1 tablet by mouth two times a day   Yes Historical Provider, MD  clopidogrel (PLAVIX) 75 MG tablet Take 75 mg by mouth daily.   Yes Historical Provider, MD  enalapril (VASOTEC) 20 MG tablet Take 20 mg by mouth 2 (two) times daily.     Yes Historical Provider, MD  furosemide (LASIX) 40 MG tablet Take 20-40 mg by mouth 2 (two) times daily. Take 1 tablet in the morning Take 0.5 tablet every evening   Yes Historical Provider, MD  hydrochlorothiazide 25 MG tablet Take 25 mg by mouth daily.     Yes Historical Provider, MD  levothyroxine (SYNTHROID, LEVOTHROID) 100 MCG tablet Take 100 mcg by mouth daily.     Yes Historical Provider, MD  potassium chloride (KLOR-CON) 8 MEQ tablet Take 8-16 mEq by mouth 2 (two) times daily. Take 2 tablets in the morning and 1 tablet in the afternoon   Yes Historical Provider, MD  rosuvastatin (CRESTOR) 40 MG tablet Take 40 mg by mouth daily.   Yes Historical Provider, MD  spironolactone (ALDACTONE) 25 MG tablet Take 25 mg by mouth daily.     Yes Historical Provider, MD  traMADol (ULTRAM) 50 MG tablet Take 50 mg by mouth 2 (two) times daily as needed. For pain   Yes Historical Provider, MD     Positive ROS: All other systems have been reviewed and were otherwise negative with the exception of those mentioned in the HPI and as above.  Physical Exam: General: Alert, no acute distress Cardiovascular: No pedal edema Respiratory: No cyanosis, no use of accessory musculature GI: No organomegaly, abdomen is soft and non-tender Skin: No lesions in the area of chief complaint Neurologic:  Sensation intact distally Psychiatric: Patient is competent for consent with normal mood and affect  MUSCULOSKELETAL:  Right hip has severe loss of motion with pain with flexion as well as internal rotation. This is located in the right groin.  Assessment: DJD RIGHT HIP  Plan: Plan for Procedure(s): TOTAL HIP ARTHROPLASTY  The risks benefits and alternatives were discussed with the patient including but not limited to the risks of nonoperative treatment, versus surgical intervention including infection, bleeding, nerve injury, periprosthetic fracture, the need for revision surgery, dislocation, leg length discrepancy, blood clots, cardiopulmonary complications, morbidity, mortality, among others, and they were willing to proceed.  Predicted outcome is good, although there will be at least a six to nine month expected recovery.   Eulas Post, MD 10/27/2011 12:26 PM

## 2011-10-27 NOTE — Plan of Care (Signed)
Problem: Consults Goal: Diagnosis- Total Joint Replacement Primary Total Hip     

## 2011-10-27 NOTE — Progress Notes (Signed)
ANTICOAGULATION CONSULT NOTE - Initial Consult  Pharmacy Consult for Coumadin Indication: VTE prophylaxis s/p R THA  No Known Allergies  Patient Measurements: Wt = 65.8 kg  Vital Signs: Temp: 98.2 F (36.8 C) (01/21 1714) Temp src: Oral (01/21 1714) BP: 124/70 mmHg (01/21 1714) Pulse Rate: 69  (01/21 1714)  Labs: SCr 0.69 Baseline INR 1.02 H/H: 13.5/41.5, PLTC 290    Medical History: Past Medical History  Diagnosis Date  . CAD (coronary artery disease)     acute MI in 76/08 - taxus stent  . Hypercholesteremia     takes Crestor daily  . GERD (gastroesophageal reflux disease)   . Carotid stenosis     mild  . HTN (hypertension)     takes Amlodpine and Carvedilol daily  . Dry cough   . Bronchitis     in Dec  . Ankle edema     pt lasix and hctz daily  . Headache     occasionally  . Arthritis     right hip  . Myocardial infarction     pt unsure but thinks about 76yrs ago;is taking Plavix daily   . Constipation     takes OTC stool softener as needed  . Nocturia   . Hypothyroidism     takes Synthroid daily  . Diabetes mellitus     pt doesn't take any meds for it  . Cataract     left;not ready to come off  . Primary osteoarthritis of right hip 10/27/2011    Medications:  Prescriptions prior to admission  Medication Sig Dispense Refill  . amLODipine (NORVASC) 10 MG tablet Take 10 mg by mouth daily.      Marland Kitchen aspirin 81 MG tablet Take 81 mg by mouth daily.        . carvedilol (COREG) 25 MG tablet 25 mg. Take 1 tablet by mouth two times a day      . clopidogrel (PLAVIX) 75 MG tablet Take 75 mg by mouth daily.      . enalapril (VASOTEC) 20 MG tablet Take 20 mg by mouth 2 (two) times daily.        . furosemide (LASIX) 40 MG tablet Take 20-40 mg by mouth 2 (two) times daily. Take 1 tablet in the morning Take 0.5 tablet every evening      . hydrochlorothiazide 25 MG tablet Take 25 mg by mouth daily.        Marland Kitchen levothyroxine (SYNTHROID, LEVOTHROID) 100 MCG tablet Take 100  mcg by mouth daily.        . potassium chloride (KLOR-CON) 8 MEQ tablet Take 8-16 mEq by mouth 2 (two) times daily. Take 2 tablets in the morning and 1 tablet in the afternoon      . rosuvastatin (CRESTOR) 40 MG tablet Take 40 mg by mouth daily.      Marland Kitchen spironolactone (ALDACTONE) 25 MG tablet Take 25 mg by mouth daily.        . traMADol (ULTRAM) 50 MG tablet Take 50 mg by mouth 2 (two) times daily as needed. For pain      . DISCONTD: acetaminophen (TYLENOL) 650 MG CR tablet Take 650 mg by mouth every 8 (eight) hours as needed. For pain        Assessment: 78 yo F to start Coumadin for VTE prophylaxis s/p elective THA.  Noted pt was on ASA and Plavix PTA.  Goal of Therapy:  INR 2-3   Plan:  Coumadin 5mg  PO x 1 tonight. Daily INR.  Coumadin book and video.  [employer], Pharm.D., BCPS Clinical Pharmacist Pager [phone]  10/27/2011,5:37 PM

## 2011-10-27 NOTE — Anesthesia Postprocedure Evaluation (Signed)
Anesthesia Post Note  Patient: [name] [name]  Procedure(s) Performed:  TOTAL HIP ARTHROPLASTY  Anesthesia type: General  Patient location: PACU  Post pain: Pain level controlled and Adequate analgesia  Post assessment: Post-op Vital signs reviewed, Patient's Cardiovascular Status Stable, Respiratory Function Stable, Patent Airway and Pain level controlled  Last Vitals:  Filed Vitals:   10/27/11 1518  BP:   Pulse:   Temp: 36.4 C  Resp:     Post vital signs: Reviewed and stable  Level of consciousness: awake, alert  and oriented  Complications: No apparent anesthesia complications

## 2011-10-28 ENCOUNTER — Encounter (HOSPITAL_COMMUNITY): Payer: Self-pay | Admitting: Orthopedic Surgery

## 2011-10-28 LAB — CBC
Hemoglobin: 9.4 g/dL — ABNORMAL LOW (ref 12.0–15.0)
MCH: 31 pg (ref 26.0–34.0)
Platelets: 222 10*3/uL (ref 150–400)
RBC: 3.03 MIL/uL — ABNORMAL LOW (ref 3.87–5.11)
WBC: 7.5 10*3/uL (ref 4.0–10.5)

## 2011-10-28 LAB — PROTIME-INR: Prothrombin Time: 15.9 seconds — ABNORMAL HIGH (ref 11.6–15.2)

## 2011-10-28 LAB — BASIC METABOLIC PANEL
CO2: 30 mEq/L (ref 19–32)
Calcium: 8.5 mg/dL (ref 8.4–10.5)
Chloride: 96 mEq/L (ref 96–112)
Glucose, Bld: 149 mg/dL — ABNORMAL HIGH (ref 70–99)
Potassium: 3.2 mEq/L — ABNORMAL LOW (ref 3.5–5.1)
Sodium: 134 mEq/L — ABNORMAL LOW (ref 135–145)

## 2011-10-28 MED ORDER — WARFARIN SODIUM 5 MG PO TABS
5.0000 mg | ORAL_TABLET | Freq: Once | ORAL | Status: AC
  Administered 2011-10-28: 5 mg via ORAL
  Filled 2011-10-28: qty 1

## 2011-10-28 NOTE — Progress Notes (Signed)
ANTICOAGULATION CONSULT NOTE - Follow Up Consult  Pharmacy Consult for Coumadin Indication: VTE prophylaxis s/p R THA  No Known Allergies  Patient Measurements:  wt 65.8 kg  Vital Signs: Temp: 99.4 F (37.4 C) (01/22 0635) BP: 135/92 mmHg (01/22 0635) Pulse Rate: 74  (01/22 0635)  Labs:  [name] [name] 10/28/11 0625  HGB 9.4*  HCT 28.1*  PLT 222  APTT --  LABPROT 15.9*  INR 1.24  HEPARINUNFRC --  CREATININE 0.73  CKTOTAL --  CKMB --  TROPONINI --   The CrCl is unknown because both a height and weight (above a minimum accepted value) are required for this calculation.   Assessment: Coumadin started yesterday. INR moving nicely past first dose. No bleeding noted. Hgb down to 9.4 - will watch.  Goal of Therapy:  INR 2-3   Plan:  1. Coumadin 5mg  po today 2. F/u daily INR  Lavonia Dana 10/28/2011,11:05 AM

## 2011-10-28 NOTE — Plan of Care (Signed)
Problem: Phase II Progression Outcomes Goal: Discharge plan established Agree with PT for ST SNF for therapy after acute D/C

## 2011-10-28 NOTE — Progress Notes (Signed)
Physical Therapy Treatment Patient Details Name: [name] [name] MRN: [id number] DOB: 07-20-1934 Today's Date: 10/28/2011  PT Assessment/Plan  PT - Assessment/Plan Comments on Treatment Session: Pt admitted s/p right THA, and has increased lethargy this pm.  Limited by c/o dizziness as well as fatigue.  Will continue to follow.  Co-treatment with OT. PT Plan: Discharge plan remains appropriate;Frequency remains appropriate PT Frequency: 7X/week Follow Up Recommendations: Skilled nursing facility Equipment Recommended: Defer to next venue PT Goals  Acute Rehab PT Goals PT Goal Formulation: With patient Time For Goal Achievement: 7 days PT Goal: Sit to Supine/Side - Progress: Progressing toward goal PT Goal: Sit to Stand - Progress: Progressing toward goal PT Goal: Stand to Sit - Progress: Progressing toward goal PT Goal: Ambulate - Progress: Progressing toward goal  PT Treatment Precautions/Restrictions  Precautions Precautions: Posterior Hip Precaution Booklet Issued: No Required Braces or Orthoses: No Restrictions Weight Bearing Restrictions: Yes RLE Weight Bearing: Weight bearing as tolerated Pain 8/10 in right hip with treatment.  Pt repositioned. Mobility (including Balance) Bed Mobility Bed Mobility: Yes Supine to Sit: Not tested (comment) Sitting - Scoot to Edge of Bed: Not tested (comment) Sit to Supine: 1: +2 Total assist;Patient percentage (comment);HOB flat ((pt=50%)) Sit to Supine - Details (indicate cue type and reason): Assist for right LE to maintain posterior hip precautions with cues for sequence while lowering trunk to bed slowly. Transfers Transfers: Yes Sit to Stand: 1: +2 Total assist;Patient percentage (comment);With upper extremity assist;From chair/3-in-1 ((pt=50%0) Sit to Stand Details (indicate cue type and reason): Assist to trunk to translate over BOS while facilitating extension at hips and trunk.  Cues for sequence. Stand to Sit: 1: +2 Total  assist;Patient percentage (comment);With upper extremity assist;To bed ((pt=50%)) Stand to Sit Details: Assist to slow descent to bed with cues for hand and right LE placement. Ambulation/Gait Ambulation/Gait: Yes Ambulation/Gait Assistance: 1: +2 Total assist;Patient percentage (comment) ((pt=50%)) Ambulation/Gait Assistance Details (indicate cue type and reason): Assist for extension of trunk/hips as well as to off weight right LE during stance.  Max cues for sequence due to lethargy this pm. Ambulation Distance (Feet): 5 Feet Assistive device: Rolling walker Gait Pattern: Step-to pattern;Decreased step length - right;Decreased stance time - right;Trunk flexed Stairs: No Wheelchair Mobility Wheelchair Mobility: No  Posture/Postural Control Posture/Postural Control: No significant limitations Balance Balance Assessed: No End of Session PT - End of Session Equipment Utilized During Treatment: Gait belt Activity Tolerance: Patient limited by fatigue Patient left: in bed;with call bell in reach;with family/visitor present;with bed alarm set Nurse Communication: Mobility status for transfers;Mobility status for ambulation General Behavior During Session: Lethargic Cognition: WFL for tasks performed  Cephus Shelling 10/28/2011, 2:17 PM  10/28/2011 Cephus Shelling, PT, DPT [phone]

## 2011-10-28 NOTE — Progress Notes (Signed)
Physical Therapy Evaluation Patient Details Name: [name] [name] MRN: [id number] DOB: 09-Oct-1933 Today's Date: 10/28/2011  Problem List:  Patient Active Problem List  Diagnoses  . HYPERLIPIDEMIA-MIXED  . HYPERTENSION, UNSPECIFIED  . CAD, NATIVE VESSEL  . CHRONIC DIASTOLIC HEART FAILURE  . CAROTID ARTERY STENOSIS  . SHORTNESS OF BREATH  . Hip pain  . Primary osteoarthritis of right hip    Past Medical History:  Past Medical History  Diagnosis Date  . CAD (coronary artery disease)     acute MI in 3/08 - taxus stent  . Hypercholesteremia     takes Crestor daily  . GERD (gastroesophageal reflux disease)   . Carotid stenosis     mild  . HTN (hypertension)     takes Amlodpine and Carvedilol daily  . Dry cough   . Bronchitis     in Dec  . Ankle edema     pt lasix and hctz daily  . Headache     occasionally  . Arthritis     right hip  . Myocardial infarction     pt unsure but thinks about 70yrs ago;is taking Plavix daily   . Constipation     takes OTC stool softener as needed  . Nocturia   . Hypothyroidism     takes Synthroid daily  . Diabetes mellitus     pt doesn't take any meds for it  . Cataract     left;not ready to come off  . Primary osteoarthritis of right hip 10/27/2011   Past Surgical History:  Past Surgical History  Procedure Date  . Back surgery 15+yrs ago  . Lymph node dissection 20+yrs ago    right   . Abdominal hysterectomy 20+yrs  . Coronary artery bypass graft   . Cardiac catheterization 2008    PT Assessment/Plan/Recommendation PT Assessment Clinical Impression Statement: Pt is a 78 y/o female admitted for right THA along with the below PT problem list.  Pt would benefit from acute PT to maximize independence and facilitate d/c to SNF. PT Recommendation/Assessment: Patient will need skilled PT in the acute care venue PT Problem List: Decreased strength;Decreased activity tolerance;Decreased balance;Decreased mobility;Decreased knowledge of use  of DME;Decreased knowledge of precautions;Pain Barriers to Discharge: Decreased caregiver support PT Therapy Diagnosis : Difficulty walking;Acute pain PT Plan PT Frequency: 7X/week PT Treatment/Interventions: DME instruction;Gait training;Functional mobility training;Therapeutic activities;Therapeutic exercise;Balance training;Patient/family education PT Recommendation Follow Up Recommendations: Skilled nursing facility Equipment Recommended: Defer to next venue PT Goals  Acute Rehab PT Goals PT Goal Formulation: With patient Time For Goal Achievement: 7 days Pt will go Supine/Side to Sit: with supervision PT Goal: Supine/Side to Sit - Progress: Goal set today Pt will go Sit to Supine/Side: with supervision PT Goal: Sit to Supine/Side - Progress: Goal set today Pt will go Sit to Stand: with supervision PT Goal: Sit to Stand - Progress: Goal set today Pt will go Stand to Sit: with supervision PT Goal: Stand to Sit - Progress: Goal set today Pt will Ambulate: 51 - 150 feet;with supervision;with least restrictive assistive device PT Goal: Ambulate - Progress: Goal set today Pt will Perform Home Exercise Program: with supervision, verbal cues required/provided PT Goal: Perform Home Exercise Program - Progress: Goal set today  PT Evaluation Precautions/Restrictions  Precautions Precautions: Posterior Hip Precaution Booklet Issued: Yes (comment) (Posterior Hip Precautions given on evaluation.) Required Braces or Orthoses: No Restrictions Weight Bearing Restrictions: Yes RLE Weight Bearing: Weight bearing as tolerated Prior Functioning  Home Living Lives With: Alone Type of  Home: Apartment Home Layout: One level Home Access: Stairs to enter Entrance Stairs-Rails: Right Entrance Stairs-Number of Steps: 5 Home Adaptive Equipment: Walker - rolling Prior Function Level of Independence: Independent with basic ADLs;Independent with homemaking with ambulation;Independent with  gait;Independent with transfers Able to Take Stairs?: Yes Cognition Cognition Arousal/Alertness: Awake/alert Overall Cognitive Status: Appears within functional limits for tasks assessed Orientation Level: Oriented X4 Sensation/Coordination Sensation Light Touch: Appears Intact Stereognosis: Not tested Hot/Cold: Not tested Proprioception: Not tested Coordination Gross Motor Movements are Fluid and Coordinated: Yes Fine Motor Movements are Fluid and Coordinated: Yes Extremity Assessment RUE Assessment RUE Assessment: Within Functional Limits LUE Assessment LUE Assessment: Within Functional Limits RLE Assessment RLE Assessment: Exceptions to [facility] Fall RLE Strength RLE Overall Strength: Deficits;Due to pain RLE Overall Strength Comments: 3/5 LLE Assessment LLE Assessment: Within Functional Limits Pain 5/10 in right hip.  Pt repositioned after treatment. Mobility (including Balance) Bed Mobility Bed Mobility: Yes Supine to Sit: 3: Mod assist;With rails;HOB flat Supine to Sit Details (indicate cue type and reason): Assist for right LE and trunk to translate anterior and maintain posterior hip precautions throughout session.  Cues for sequence. Sitting - Scoot to Edge of Bed: 4: Min assist;With rail Sitting - Scoot to [place] of Bed Details (indicate cue type and reason): Assist for right LE due to pain with cues for sequence. Transfers Transfers: Yes Sit to Stand: 1: +2 Total assist;Patient percentage (comment);With upper extremity assist;From bed ((pt=50%)) Sit to Stand Details (indicate cue type and reason): Assist for translation of trunk anterior over BOS with cues for hand placement/right LE placement and sequence. Stand to Sit: 1: +2 Total assist;Patient percentage (comment);With upper extremity assist;To chair/3-in-1 ((pt=50%)) Stand to Sit Details: Assist to control eccentric descent of trunk to chair with cues for hand/right LE placement to maintain posterior hip precautions.   Cues for sequence. Ambulation/Gait Ambulation/Gait: Yes Ambulation/Gait Assistance: 1: +2 Total assist;Patient percentage (comment) ((pt=50%)) Ambulation/Gait Assistance Details (indicate cue type and reason): Assist to shift weight right and left to advance bilateral LEs and off weight right LE during stance.  Cues for tall posture and sequence inside RW. Ambulation Distance (Feet): 5 Feet Assistive device: Rolling walker Gait Pattern: Step-to pattern;Decreased step length - right;Decreased stance time - right;Trunk flexed Stairs: No Wheelchair Mobility Wheelchair Mobility: No  Posture/Postural Control Posture/Postural Control: No significant limitations Balance Balance Assessed: No Exercise  Total Joint Exercises Ankle Circles/Pumps: AROM;Right;10 reps;Supine Quad Sets: AROM;Right;10 reps;Supine Heel Slides: AAROM;Right;10 reps;Supine End of Session PT - End of Session Equipment Utilized During Treatment: Gait belt Activity Tolerance: Patient tolerated treatment well Patient left: in chair;with call bell in reach Nurse Communication: Mobility status for transfers;Mobility status for ambulation General Behavior During Session: [facility] for tasks performed Cognition: [facility] for tasks performed  Cephus Shelling 10/28/2011, 10:34 AM  10/28/2011 Cephus Shelling, PT, DPT [phone]

## 2011-10-28 NOTE — Progress Notes (Signed)
Procedure(s) (LRB): TOTAL HIP ARTHROPLASTY (Right) 1 Day Post-Op   Subjective:  Patient reports pain as marked.  She just received pain medication, and is now very somnolent, and follows commands minimally, although she is basically asleep. She is resting in bed.  Objective:   VITALS:  BP 135/92  Pulse 74  Temp(Src) 99.4 F (37.4 C) (Oral)  Resp 16  SpO2 100%  Neurologically intact Dorsiflexion/Plantar flexion intact Incision: dressing C/D/I  LABS Lab Results  Component Value Date   HGB 9.4* 10/28/2011   HGB 13.5 10/22/2011   Lab Results  Component Value Date   WBC 7.5 10/28/2011   PLT 222 10/28/2011   Lab Results  Component Value Date   INR 1.24 10/28/2011   Lab Results  Component Value Date   NA 134* 10/28/2011   K 3.2* 10/28/2011   CL 96 10/28/2011   CO2 30 10/28/2011   BUN 10 10/28/2011   CREATININE 0.73 10/28/2011   GLUCOSE 149* 10/28/2011    Assessment/Plan: Principal Problem:  *Primary osteoarthritis of right hip   Advance diet Up with therapy Discharge to SNF She has some mild postoperative anemia, which we will continue to monitor.   Darrian Grzelak P 10/28/2011, 5:45 PM

## 2011-10-28 NOTE — Progress Notes (Signed)
Occupational Therapy Evaluation Patient Details Name: [name] [name] MRN: [id number] DOB: 11/13/1933 Today's Date: 10/28/2011  Problem List:  Patient Active Problem List  Diagnoses  . HYPERLIPIDEMIA-MIXED  . HYPERTENSION, UNSPECIFIED  . CAD, NATIVE VESSEL  . CHRONIC DIASTOLIC HEART FAILURE  . CAROTID ARTERY STENOSIS  . SHORTNESS OF BREATH  . Hip pain  . Primary osteoarthritis of right hip    Past Medical History:  Past Medical History  Diagnosis Date  . CAD (coronary artery disease)     acute MI in 3/08 - taxus stent  . Hypercholesteremia     takes Crestor daily  . GERD (gastroesophageal reflux disease)   . Carotid stenosis     mild  . HTN (hypertension)     takes Amlodpine and Carvedilol daily  . Dry cough   . Bronchitis     in Dec  . Ankle edema     pt lasix and hctz daily  . Headache     occasionally  . Arthritis     right hip  . Myocardial infarction     pt unsure but thinks about 58yrs ago;is taking Plavix daily   . Constipation     takes OTC stool softener as needed  . Nocturia   . Hypothyroidism     takes Synthroid daily  . Diabetes mellitus     pt doesn't take any meds for it  . Cataract     left;not ready to come off  . Primary osteoarthritis of right hip 10/27/2011   Past Surgical History:  Past Surgical History  Procedure Date  . Back surgery 15+yrs ago  . Lymph node dissection 20+yrs ago    right   . Abdominal hysterectomy 20+yrs  . Coronary artery bypass graft   . Cardiac catheterization 2008    OT Assessment/Plan/Recommendation OT Assessment Clinical Impression Statement: Pt demos decline in function with strength, balance, activity tolerance and safety for ADLs and ADL mobility. Pt would benefit from skilled OT services to address these impairments to increase independence and maximize level of function  OT Recommendation/Assessment: Patient will need skilled OT in the acute care venue OT Problem List: Decreased strength;Decreased activity  tolerance;Decreased safety awareness;Impaired balance (sitting and/or standing);Decreased knowledge of use of DME or AE;Decreased knowledge of precautions;Pain Barriers to Discharge: Decreased caregiver support Barriers to Discharge Comments: Pt lives at home alone OT Therapy Diagnosis : Generalized weakness OT Plan OT Frequency: Min 2X/week OT Treatment/Interventions: Self-care/ADL training;Therapeutic activities;Therapeutic exercise;Neuromuscular education;DME and/or AE instruction;Patient/family education;Balance training OT Recommendation Follow Up Recommendations: Skilled nursing facility Equipment Recommended: Defer to next venue Individuals Consulted Consulted and Agree with Results and Recommendations: Patient OT Goals Acute Rehab OT Goals OT Goal Formulation: With patient Time For Goal Achievement: 2 days ADL Goals Pt Will Perform Grooming: with set-up;Sitting, edge of bed ADL Goal: Grooming - Progress: Goal set today Pt Will Perform Upper Body Bathing: with set-up;with supervision;Sitting, edge of bed ADL Goal: Upper Body Bathing - Progress: Goal set today Pt Will Perform Lower Body Bathing: with mod assist;with max assist;Sitting, edge of bed Pt Will Perform Upper Body Dressing: with set-up;with supervision;Sitting, bed ADL Goal: Upper Body Dressing - Progress: Goal set today Pt Will Perform Lower Body Dressing: with mod assist;with max assist;Sitting, bed;with adaptive equipment ADL Goal: Lower Body Dressing - Progress: Goal set today Pt Will Transfer to Toilet: with mod assist;with DME;Grab bars ADL Goal: Toilet Transfer - Progress: Goal set today Pt Will Perform Toileting - Clothing Manipulation: with mod assist ADL Goal: Toileting -  Clothing Manipulation - Progress: Goal set today Pt Will Perform Toileting - Hygiene: with mod assist ADL Goal: Toileting - Hygiene - Progress: Goal set today  OT Evaluation Precautions/Restrictions  Precautions Precautions: Posterior  Hip Precaution Booklet Issued: No Required Braces or Orthoses: No Restrictions Weight Bearing Restrictions: Yes RLE Weight Bearing: Weight bearing as tolerated Prior Functioning Home Living Lives With: Alone Type of Home: Apartment Home Layout: One level Home Access: Stairs to enter Entrance Stairs-Rails: Right Entrance Stairs-Number of Steps: 5 Bathroom Shower/Tub: Engineer, manufacturing systems: Standard Bathroom Accessibility: Yes How Accessible: Accessible via walker Home Adaptive Equipment: Walker - rolling Prior Function Level of Independence: Independent with basic ADLs;Independent with homemaking with ambulation;Independent with homemaking with wheelchair;Independent with gait;Independent with transfers Vocation: Retired ADL ADL Eating/Feeding: Not assessed Grooming: Simulated;Wash/dry face;Wash/dry hands;Set up Where Assessed - Grooming: Sitting, bed Upper Body Bathing: Simulated;Minimal assistance Where Assessed - Upper Body Bathing: Sitting, bed Lower Body Bathing: +1 Total assistance Upper Body Dressing: Performed;Minimal assistance Toilet Transfer: Simulated;Maximal assistance;Other (comment) (simulated chair to bed) Toilet Transfer Method: Ambulating Toileting - Clothing Manipulation: +1 Total assistance Where Assessed - Toileting Clothing Manipulation: Standing Toileting - Hygiene: Not assessed Equipment Used: Rolling walker Ambulation Related to ADLs: Pt unablt to trnasfer to [facility] or ambulte to bathroom due to R LE pain and lethargy Vision/Perception  Vision - History Baseline Vision: No visual deficits Perception Perception: Within Functional Limits Cognition Cognition Arousal/Alertness: Awake/alert Overall Cognitive Status: Appears within functional limits for tasks assessed Orientation Level: Oriented X4 Sensation/Coordination Sensation Light Touch: Appears Intact Coordination Gross Motor Movements are Fluid and Coordinated: Yes Fine Motor  Movements are Fluid and Coordinated: Yes Extremity Assessment RUE Assessment RUE Assessment: Within Functional Limits (MMT 3/5) LUE Assessment LUE Assessment: Within Functional Limits (MMT 3/5) Mobility  Bed Mobility Bed Mobility: Yes Supine to Sit: Not tested (comment) Sitting - Scoot to Edge of Bed: Not tested (comment) Sit to Supine: 2: Max assist Sit to Supine - Details (indicate cue type and reason): Assist for right LE to maintain posterior hip precautions with cues for sequence while lowering trunk to bed slowly. Transfers Transfers: Yes Sit to Stand: 1: +2 Total assist Sit to Stand Details (indicate cue type and reason): Assist to trunk to translate over BOS while facilitating extension at hips and trunk.  Cues for sequence. Stand to Sit: 1: +2 Total assist Stand to Sit Details: Assist to slow descent to bed with cues for hand and right LE placement. End of Session OT - End of Session Equipment Utilized During Treatment: Gait belt Activity Tolerance: Patient limited by fatigue;Patient limited by pain Patient left: in bed;with call bell in reach;with family/visitor present General Behavior During Session: Lethargic Cognition: WFL for tasks performed   Galen Manila 10/28/2011, 2:46 PM

## 2011-10-29 LAB — BASIC METABOLIC PANEL
BUN: 23 mg/dL (ref 6–23)
Chloride: 96 mEq/L (ref 96–112)
GFR calc Af Amer: 31 mL/min — ABNORMAL LOW (ref >90)
GFR calc non Af Amer: 27 mL/min — ABNORMAL LOW (ref >90)
Glucose, Bld: 177 mg/dL — ABNORMAL HIGH (ref 70–99)
Potassium: 4 mEq/L (ref 3.5–5.1)
Sodium: 131 mEq/L — ABNORMAL LOW (ref 135–145)

## 2011-10-29 LAB — CBC
HCT: 22.1 % — ABNORMAL LOW (ref 36.0–46.0)
Hemoglobin: 7.5 g/dL — ABNORMAL LOW (ref 12.0–15.0)
MCHC: 33.9 g/dL (ref 30.0–36.0)
WBC: 12.3 10*3/uL — ABNORMAL HIGH (ref 4.0–10.5)

## 2011-10-29 LAB — PROTIME-INR
INR: 2.02 — ABNORMAL HIGH (ref 0.00–1.49)
Prothrombin Time: 23.2 seconds — ABNORMAL HIGH (ref 11.6–15.2)

## 2011-10-29 MED ORDER — MORPHINE SULFATE 2 MG/ML IJ SOLN: 0.5000 mg | INTRAMUSCULAR | Status: DC | PRN

## 2011-10-29 MED ORDER — WARFARIN 0.5 MG HALF TABLET
0.5000 mg | ORAL_TABLET | Freq: Once | ORAL | Status: AC
  Administered 2011-10-29: 0.5 mg via ORAL
  Filled 2011-10-29: qty 1

## 2011-10-29 MED ORDER — DIPHENHYDRAMINE HCL 25 MG PO CAPS
25.0000 mg | ORAL_CAPSULE | Freq: Once | ORAL | Status: DC
  Filled 2011-10-29: qty 1

## 2011-10-29 MED ORDER — ACETAMINOPHEN 325 MG PO TABS
650.0000 mg | ORAL_TABLET | Freq: Once | ORAL | Status: AC
  Administered 2011-10-29: 650 mg via ORAL
  Filled 2011-10-29: qty 2

## 2011-10-29 NOTE — Progress Notes (Addendum)
Subjective: 2 Days Post-Op Procedure(s) (LRB): TOTAL HIP ARTHROPLASTY (Right) Patient reports pain as moderate and severe.   Neg. For CP and SOB. She still appears somewhat lethargic. She reports significant fatigue. Objective: Vital signs in last 24 hours: Temp:  [98.8 F (37.1 C)-98.9 F (37.2 C)] 98.8 F (37.1 C) (01/23 0546) Pulse Rate:  [57-70] 59  (01/23 0546) Resp:  [16] 16  (01/23 0546) BP: (72-90)/(30-55) 90/55 mmHg (01/23 0546) SpO2:  [94 %-96 %] 96 % (01/23 0546)  Intake/Output from previous day: 01/22 0701 - 01/23 0700 In: 240 [P.O.:240] Out: 200 [Urine:200] Intake/Output this shift:     Basename 10/29/11 0512 10/28/11 0625  HGB 7.5* 9.4*    Basename 10/29/11 0512 10/28/11 0625  WBC 12.3* 7.5  RBC 2.42* 3.03*  HCT 22.1* 28.1*  PLT 177 222    Basename 10/29/11 0512 10/28/11 0625  NA 131* 134*  K 4.0 3.2*  CL 96 96  CO2 26 30  BUN 23 10  CREATININE 1.76* 0.73  GLUCOSE 177* 149*  CALCIUM 8.0* 8.5    Basename 10/29/11 0512 10/28/11 0625  LABPT -- --  INR 2.02* 1.24    ABD soft Sensation intact distally Dorsiflexion/Plantar flexion intact Incision: dressing C/D/I and scant drainage  Assessment/Plan: 2 Days Post-Op Procedure(s) (LRB): TOTAL HIP ARTHROPLASTY (Right) Advance diet Up with therapy Plan to transfuse PRBC for the treatment of acute blood loss anemia. Plan SNF Friday or Monday She also has some degree of renal insufficiency, with a creatinine of 1.76. We will maintain fluids, and monitor, and a just if needed. We will also discontinue her Vasotec, and also one of the doses of her Lasix for the time being. We may also hold her hydrochlorothiazide if her blood pressure remains low, and her creatinine remained high. If we continue having problems, then I will plan to get the hospitalist service involved to assist.  [name] [name], BRANDON 10/29/2011, 8:46 AM

## 2011-10-29 NOTE — Progress Notes (Signed)
Clinical Social Worker received consult for "SNF." Currently PT and OT are recommending SNF at discharge. CSW met with pt to address consult. Pt's Brief Psychosocial Assessment is in pt's chart. CSW introduced herself and explained role of social work. Pt lives independently in [place]. Pt's daughter, Carlos Quackenbush, lives nearby. Pt's son, Fredna Stricker, lives in [place], [place]. Pt reports both, her son and daughter, to be a source of support. Pt is agreeable to discharge plan to SNF. CSW will initiate SNF search in [place] and [place], [place], per pt request. CSW will follow up with bed offers and facilitate discharge to SNF when medically ready. CSW will continue to follow.   Dede Query, MSW, Theresia Majors [phone]

## 2011-10-29 NOTE — Progress Notes (Signed)
10/29/11 8:51 PT Note: Pt with low Hgb of 7.5 this am and to receive 2 units of blood.  Will follow as able.  10/29/2011 Cephus Shelling, PT, DPT [phone]

## 2011-10-29 NOTE — Progress Notes (Signed)
ANTICOAGULATION CONSULT NOTE - Follow Up Consult  Pharmacy Consult for Coumadin Indication: VTE prophylaxis s/p R THA  Assessment: 78 yo F on Coumadin for VTE px. INR rapidly increased after 2 doses to goal range, although patient is not fully anticoagulated yet.  No bleeding noted but with ABLA - receiving 2 units PRBCs today.  Goal of Therapy:  INR 2-3   Plan:  1. Coumadin 0.5 mg po today 2. F/u daily INR   No Known Allergies  Patient Measurements:  wt 65.8 kg  Vital Signs: Temp: 98.3 F (36.8 C) (01/23 0930) Temp src: Oral (01/23 0930) BP: 92/47 mmHg (01/23 1129) Pulse Rate: 61  (01/23 0930)  Labs:  [name] [name] 10/29/11 0512 10/28/11 0625  HGB 7.5* 9.4*  HCT 22.1* 28.1*  PLT 177 222  APTT -- --  LABPROT 23.2* 15.9*  INR 2.02* 1.24  HEPARINUNFRC -- --  CREATININE 1.76* 0.73  CKTOTAL -- --  CKMB -- --  TROPONINI -- --   The CrCl is unknown because both a height and weight (above a minimum accepted value) are required for this calculation.   Gilliam, Takeem Krotzer Danielle 10/29/2011,11:38 AM

## 2011-10-30 LAB — BASIC METABOLIC PANEL
Chloride: 97 mEq/L (ref 96–112)
GFR calc Af Amer: >90 mL/min (ref >90)
GFR calc non Af Amer: 80 mL/min — ABNORMAL LOW (ref >90)
Potassium: 4.3 mEq/L (ref 3.5–5.1)
Sodium: 132 mEq/L — ABNORMAL LOW (ref 135–145)

## 2011-10-30 LAB — PROTIME-INR
INR: 1.95 — ABNORMAL HIGH (ref 0.00–1.49)
Prothrombin Time: 22.6 seconds — ABNORMAL HIGH (ref 11.6–15.2)

## 2011-10-30 LAB — CBC
Hemoglobin: 10.5 g/dL — ABNORMAL LOW (ref 12.0–15.0)
MCHC: 34.2 g/dL (ref 30.0–36.0)
RDW: 13.6 % (ref 11.5–15.5)
WBC: 12.3 10*3/uL — ABNORMAL HIGH (ref 4.0–10.5)

## 2011-10-30 MED ORDER — WARFARIN SODIUM 4 MG PO TABS
4.0000 mg | ORAL_TABLET | Freq: Once | ORAL | Status: AC
  Administered 2011-10-30: 4 mg via ORAL
  Filled 2011-10-30: qty 1
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
## 2011-10-30 NOTE — Progress Notes (Signed)
Subjective: 3 Days Post-Op Procedure(s) (LRB): TOTAL HIP ARTHROPLASTY (Right) Patient reports pain as 7 on 0-10 scale and 8 on 0-10 scale.  Still somewhat lethargic, hasn't been able to do much with PT. No complaints of CP or SOB Objective: Vital signs in last 24 hours: Temp:  [97.8 F (36.6 C)-98.8 F (37.1 C)] 98.5 F (36.9 C) (01/24 0522) Pulse Rate:  [51-63] 51  (01/24 0522) Resp:  [14-20] 18  (01/24 0522) BP: (86-113)/(43-52) 106/50 mmHg (01/24 0522) SpO2:  [95 %-98 %] 95 % (01/24 0522)  Intake/Output from previous day: 01/23 0701 - 01/24 0700 In: 1860 [P.O.:960; I.V.:200; Blood:700] Out: 600 [Urine:600] Intake/Output this shift:     Basename 10/30/11 0635 10/29/11 0512 10/28/11 0625  HGB 10.5* 7.5* 9.4*    Basename 10/30/11 0635 10/29/11 0512  WBC 12.3* 12.3*  RBC 3.40* 2.42*  HCT 30.7* 22.1*  PLT 181 177    Basename 10/29/11 0512 10/28/11 0625  NA 131* 134*  K 4.0 3.2*  CL 96 96  CO2 26 30  BUN 23 10  CREATININE 1.76* 0.73  GLUCOSE 177* 149*  CALCIUM 8.0* 8.5    Basename 10/30/11 0635 10/29/11 0512  LABPT -- --  INR 1.95* 2.02*    Sensation intact distally Intact pulses distally Dorsiflexion/Plantar flexion intact Incision: dressing C/D/I and no drainage  Assessment/Plan: 3 Days Post-Op Procedure(s) (LRB): TOTAL HIP ARTHROPLASTY (Right) Advance diet Up with therapy Discharge to SNF tomorrow, and hopefully her energy levels pick up and makes some progress  With PT.  Torrie Mayers, BRANDON 10/30/2011, 7:43 AM

## 2011-10-30 NOTE — Progress Notes (Signed)
CSW met with pt and family to provide bed offers. Pt's has limited bed offers due to pt's insurance. CSW expanded search to [facility] with pt's permission. CSW will follow with additional bed offers. CSW will continue to follow.   Dede Query, MSW, Theresia Majors [phone]

## 2011-10-30 NOTE — Progress Notes (Signed)
Physical Therapy Treatment Patient Details Name: [name] [name] MRN: [id number] DOB: [birth year] Today's Date: 10/30/2011  PT Assessment/Plan  PT - Assessment/Plan Comments on Treatment Session: Pt admitted s/p right THA and is able to tolerate treatment much better today.  Pt able to ambulate as well as perform ther ex with PT today.  Very motivated.  No c/o dizziness. PT Plan: Discharge plan remains appropriate;Frequency remains appropriate PT Frequency: 7X/week Follow Up Recommendations: Skilled nursing facility Equipment Recommended: Defer to next venue PT Goals  Acute Rehab PT Goals PT Goal Formulation: With patient Time For Goal Achievement: 7 days PT Goal: Supine/Side to Sit - Progress: Progressing toward goal PT Goal: Sit to Stand - Progress: Progressing toward goal PT Goal: Stand to Sit - Progress: Progressing toward goal PT Goal: Ambulate - Progress: Progressing toward goal PT Goal: Perform Home Exercise Program - Progress: Progressing toward goal  PT Treatment Precautions/Restrictions  Precautions Precautions: Posterior Hip Precaution Booklet Issued: No Required Braces or Orthoses: No Restrictions Weight Bearing Restrictions: Yes RLE Weight Bearing: Weight bearing as tolerated Pain 2/10 in right hip.  Pt repositioned. Mobility (including Balance) Bed Mobility Bed Mobility: Yes Supine to Sit: 4: Min assist;HOB flat Supine to Sit Details (indicate cue type and reason): Assist for right LE to maintain posterior hip precautions/due to pain with cues for sequence. Sitting - Scoot to Edge of Bed: 4: Min assist Sitting - Scoot to [place] of Bed Details (indicate cue type and reason): Assist for right LE due to pain with cues for sequence. Sit to Supine: Not Tested (comment) Transfers Transfers: Yes Sit to Stand: 3: Mod assist;With upper extremity assist;From bed;From chair/3-in-1 (2 trials.) Sit to Stand Details (indicate cue type and reason): Assist to translate trunk over  BOS with cues for hand placement and sequence. Stand to Sit: 4: Min assist;With upper extremity assist;To chair/3-in-1 (2 trials.) Stand to Sit Details: Assist to slow descent to chair with cues for hand/right LE placement. Ambulation/Gait Ambulation/Gait: Yes Ambulation/Gait Assistance: 4: Min assist Ambulation/Gait Assistance Details (indicate cue type and reason): Assist to off weight right LE during stance due to pain with cues for sequence/safety inside RW. Ambulation Distance (Feet): 20 Feet Assistive device: Rolling walker Gait Pattern: Step-to pattern;Decreased step length - right;Decreased stance time - right;Trunk flexed Stairs: No Wheelchair Mobility Wheelchair Mobility: No  Posture/Postural Control Posture/Postural Control: No significant limitations Balance Balance Assessed: No Exercise  Total Joint Exercises Ankle Circles/Pumps: AROM;Right;10 reps;Supine Quad Sets: AROM;Right;10 reps;Supine Heel Slides: AAROM;Right;10 reps;Supine Hip ABduction/ADduction: AROM;Right;10 reps;Supine Straight Leg Raises: AAROM;Right;10 reps;Supine End of Session PT - End of Session Equipment Utilized During Treatment: Gait belt Activity Tolerance: Patient tolerated treatment well Patient left: in chair;with call bell in reach Nurse Communication: Mobility status for transfers;Mobility status for ambulation General Behavior During Session: [facility] for tasks performed Cognition: [facility] for tasks performed  Cephus Shelling 10/30/2011, 12:56 PM  10/30/2011 Cephus Shelling, PT, DPT [phone]

## 2011-10-30 NOTE — Progress Notes (Addendum)
ANTICOAGULATION CONSULT NOTE - Follow Up Consult  Pharmacy Consult for Coumadin Indication: VTE prophylaxis s/p R THA   No Known Allergies  Patient Measurements:  wt 65.8 kg  Vital Signs: Temp: 98.5 F (36.9 C) (01/24 0522) BP: 106/50 mmHg (01/24 0522) Pulse Rate: 51  (01/24 0522)  Labs:  Alvira Philips 10/30/11 1610 10/29/11 0512 10/28/11 0625  HGB 10.5* 7.5* --  HCT 30.7* 22.1* 28.1*  PLT 181 177 222  APTT -- -- --  LABPROT 22.6* 23.2* 15.9*  INR 1.95* 2.02* 1.24  HEPARINUNFRC -- -- --  CREATININE -- 1.76* 0.73  CKTOTAL -- -- --  CKMB -- -- --  TROPONINI -- -- --   The CrCl is unknown because both a height and weight (above a minimum accepted value) are required for this calculation.  Assessment: 78 yo F on Coumadin for VTE px. INR slight decrease to 1.95 today. H/H up today after - received  2 units PRBCs yesterday. No bleeding noted.  Lovenox to be discontinued today as INR is >1.8 per MD's order.   Goal of Therapy:  INR 2-3   Plan:   Discontiune Lovenox per orders to DC when INR = or > 1.8. Patient is being discharged today on Coumadin 5mg  daily per protocol management by home health. Arman Filter 10/30/2011,8:46 AM    Addendum:  Patient will be discharged tomorrow.  Give Coumadin 4mg  tonight x 1 and check INR in AM.  Arman Filter 10/30/11 @ 1614

## 2011-10-31 LAB — BASIC METABOLIC PANEL
CO2: 26 mEq/L (ref 19–32)
Calcium: 8.8 mg/dL (ref 8.4–10.5)
Chloride: 99 mEq/L (ref 96–112)
GFR calc Af Amer: >90 mL/min (ref >90)
GFR calc non Af Amer: 81 mL/min — ABNORMAL LOW (ref >90)
Potassium: 4.1 mEq/L (ref 3.5–5.1)
Sodium: 134 mEq/L — ABNORMAL LOW (ref 135–145)

## 2011-10-31 LAB — PROTIME-INR
INR: 2.06 — ABNORMAL HIGH (ref 0.00–1.49)
Prothrombin Time: 23.6 seconds — ABNORMAL HIGH (ref 11.6–15.2)

## 2011-10-31 MED ORDER — WARFARIN SODIUM 4 MG PO TABS
4.0000 mg | ORAL_TABLET | ORAL | Status: AC
  Administered 2011-10-31: 4 mg via ORAL
  Filled 2011-10-31: qty 1

## 2011-10-31 NOTE — Progress Notes (Signed)
Physical Therapy Treatment Patient Details Name: [name] [name] MRN: [id number] DOB: Jan 20, 1934 Today's Date: 10/31/2011  PT Assessment/Plan  PT - Assessment/Plan Comments on Treatment Session: Pt adm with rt. THA.  Progress limited this PM by fatigue and pain. Follow Up Recommendations: Skilled nursing facility Equipment Recommended: Defer to next venue PT Goals  Acute Rehab PT Goals PT Goal: Sit to Supine/Side - Progress: Progressing toward goal PT Goal: Sit to Stand - Progress: Not progressing PT Goal: Stand to Sit - Progress: Progressing toward goal PT Goal: Ambulate - Progress: Not progressing  PT Treatment Precautions/Restrictions  Precautions Precautions: Posterior Hip Precaution Booklet Issued: No Precaution Comments: Able to recall 2 I'ly; re-educated on 3rd precaution (no bending) Required Braces or Orthoses: No Restrictions Weight Bearing Restrictions: Yes RLE Weight Bearing: Weight bearing as tolerated Mobility (including Balance) Bed Mobility Sit to Supine: 4: Min assist;HOB flat Sit to Supine - Details (indicate cue type and reason): verbal cues for technique and assist to bring RLE up onto bed. Transfers Sit to Stand: From chair/3-in-1;3: Mod assist;With upper extremity assist;With armrests Sit to Stand Details (indicate cue type and reason): assist to lift hips due to low chair and pt fatique Stand to Sit: To bed;4: Min assist;With upper extremity assist Stand to Sit Details: cues for hand placement Ambulation/Gait Ambulation/Gait Assistance: 4: Min assist Ambulation/Gait Assistance Details (indicate cue type and reason): pt with difficulty advancing either LE. Ambulation Distance (Feet): 2 Feet Assistive device: Rolling walker Gait Pattern: Decreased step length - right;Decreased step length - left;Decreased stance time - right;Decreased weight shift to right;Antalgic    Exercise    End of Session PT - End of Session Equipment Utilized During Treatment:  Gait belt Activity Tolerance: Patient limited by pain;Patient limited by fatigue Patient left: in bed Nurse Communication: Mobility status for transfers;Mobility status for ambulation General Behavior During Session: [facility] for tasks performed Cognition: [facility] for tasks performed  [facility] 10/31/2011, 2:22 PM  [facility] PT [phone]

## 2011-10-31 NOTE — Progress Notes (Signed)
Her renal insufficiency has improved. Her blood count also improved. Clinically. She is planning to be discharged to skilled nursing today. A new discharge summary has been completed.

## 2011-10-31 NOTE — Progress Notes (Signed)
Admit Complaint: s/p R THA  Anticoagulation: 78 yo AAF on Coumadin for VTE px s/p R THA. ABLA - H/H up after 2 units PRBC 1/23. No bleeding noted. INR 1.24 -> 2.02-> 1.95 > 2.06 today  (warf points 3)  Plan 1. Off lovenox; coumadin 4mg  po x1 before discharge and further dosing by home health.

## 2011-10-31 NOTE — Progress Notes (Signed)
Occupational Therapy Treatment Patient Details Name: [name] [name] MRN: [id number] DOB: 02/25/1934 Today's Date: 10/31/2011  OT Assessment/Plan OT Assessment/Plan Comments on Treatment Session: Progressing well OT Plan: Discharge plan remains appropriate Follow Up Recommendations: Skilled nursing facility Equipment Recommended: Defer to next venue OT Goals ADL Goals ADL Goal: Grooming - Progress: Progressing toward goals Pt Will Transfer to Toilet: with modified independence;Ambulation;3-in-1 ADL Goal: Toilet Transfer - Progress: Updated due to goal met Pt Will Perform Toileting - Clothing Manipulation: with modified independence;Standing ADL Goal: Toileting - Clothing Manipulation - Progress: Updated due to goal met Pt Will Perform Toileting - Hygiene: with modified independence;Standing at 3-in-1/toilet ADL Goal: Toileting - Hygiene - Progress: Updated due to goal met  OT Treatment Precautions/Restrictions  Precautions Precautions: Posterior Hip Precaution Comments: Able to recall 2 I'ly; re-educated on 3rd precaution (no bending) Restrictions RLE Weight Bearing: Weight bearing as tolerated   ADL ADL Grooming: Performed;Wash/dry hands;Supervision/safety Where Assessed - Grooming: Standing at sink Toilet Transfer: Performed;Minimal Dentist Details (indicate cue type and reason): Min A for sit to stand from 3n1 and for RW ambulation from chair to 3n1 and back Toilet Transfer Method: Proofreader: Set designer - Clothing Manipulation: Minimal assistance;Performed Toileting - Clothing Manipulation Details (indicate cue type and reason): gown Where Assessed - Glass blower/designer Manipulation: Standing Toileting - Hygiene: Performed;Minimal assistance Toileting - Hygiene Details (indicate cue type and reason): setup and assist to maintain balance Where Assessed - Toileting Hygiene: Standing Tub/Shower Transfer: Not  assessed Ambulation Related to ADLs: Pt denies pain but c/o "stiffness" and states this is reason for slow gait.  End of Session OT - End of Session Equipment Utilized During Treatment: Gait belt Activity Tolerance: Patient tolerated treatment well Patient left: in chair;with call bell in reach General Behavior During Session: [facility] for tasks performed Cognition: [facility] for tasks performed  Lorieann Argueta  10/31/2011, 1:03 PM

## 2011-10-31 NOTE — Plan of Care (Signed)
Problem: Discharge Progression Outcomes Goal: Anticoagulant follow-up in place Outcome: Completed/Met Date Met:  10/31/11 D/c to snf Goal: Activity appropriate for discharge plan Outcome: Completed/Met Date Met:  10/31/11 D/c to snf

## 2011-10-31 NOTE — Progress Notes (Signed)
Physical Therapy Treatment Patient Details Name: [name] [name] MRN: [id number] DOB: [birth year] Today's Date: 10/31/2011  PT Assessment/Plan  PT - Assessment/Plan Comments on Treatment Session: Pt admitted s/p right THA and is progressing well.  Pt motivated to do well and is able to increase ambulation distance as well as perform right LE ther ex.  Will follow. PT Plan: Discharge plan remains appropriate;Frequency remains appropriate PT Frequency: 7X/week Follow Up Recommendations: Skilled nursing facility Equipment Recommended: Defer to next venue PT Goals  Acute Rehab PT Goals PT Goal Formulation: With patient Time For Goal Achievement: 7 days PT Goal: Supine/Side to Sit - Progress: Progressing toward goal PT Goal: Sit to Stand - Progress: Progressing toward goal PT Goal: Stand to Sit - Progress: Progressing toward goal PT Goal: Ambulate - Progress: Progressing toward goal PT Goal: Perform Home Exercise Program - Progress: Progressing toward goal  PT Treatment Precautions/Restrictions  Precautions Precautions: Posterior Hip Precaution Booklet Issued: No Required Braces or Orthoses: No Restrictions Weight Bearing Restrictions: Yes RLE Weight Bearing: Weight bearing as tolerated Pain 4/10 in right LE.  Pt repositioned. Mobility (including Balance) Bed Mobility Bed Mobility: Yes Supine to Sit: 4: Min assist;HOB flat Supine to Sit Details (indicate cue type and reason): Assist for right LE to maintain posterior hip precautions with cues for sequence. Sitting - Scoot to Edge of Bed: 4: Min assist Sitting - Scoot to [place] of Bed Details (indicate cue type and reason): Assist for right LE due to pain with cues for sequence. Sit to Supine: Not Tested (comment) Transfers Transfers: Yes Sit to Stand: 4: Min assist;With upper extremity assist;From bed Sit to Stand Details (indicate cue type and reason): Assist to off weight right LE due to pain with cues for hand/right LE  placement. Stand to Sit: 4: Min assist;With upper extremity assist;To chair/3-in-1 Stand to Sit Details: Assist to slow descent due to pain with cues for hand/right LE placement. Ambulation/Gait Ambulation/Gait: Yes Ambulation/Gait Assistance: 4: Min assist Ambulation/Gait Assistance Details (indicate cue type and reason): Assist to off weight right LE due to pain with cues for sequence with RW as well as to extend trunk tall. Ambulation Distance (Feet): 30 Feet Assistive device: Rolling walker Gait Pattern: Step-to pattern;Decreased step length - right;Decreased stance time - right;Trunk flexed Stairs: No Wheelchair Mobility Wheelchair Mobility: No  Posture/Postural Control Posture/Postural Control: No significant limitations Balance Balance Assessed: No Exercise  Total Joint Exercises Ankle Circles/Pumps: AROM;Right;10 reps;Supine Quad Sets: AROM;Right;10 reps;Supine Short Arc Quad: AROM;Right;10 reps;Supine Heel Slides: AAROM;Right;10 reps;Supine Hip ABduction/ADduction: AAROM;Right;10 reps;Supine End of Session PT - End of Session Equipment Utilized During Treatment: Gait belt Activity Tolerance: Patient tolerated treatment well Patient left: in chair;with call bell in reach Nurse Communication: Mobility status for transfers;Mobility status for ambulation General Behavior During Session: [facility] for tasks performed Cognition: [facility] for tasks performed  Cephus Shelling 10/31/2011, 9:08 AM  10/31/2011 Cephus Shelling, PT, DPT [phone]

## 2011-10-31 NOTE — Progress Notes (Signed)
Pt has accepted a bed offer at [facility].Facility is waiting for insurance prior approval. Pt and family are aware that pt could potentially be discharged Saturday or Monday. Once prior approval is obtained pt is able to be transferred to facility. CSW left a message for MD at his office. Weekend Coverage CSW will contact facility to inquire about prior approval and then advise. CSW will continue to follow to facilitate transfer to [facility] once insurance prior approval is obtained.   Dede Query, MSW, [facility] [phone]

## 2011-11-01 LAB — PROTIME-INR
INR: 2.35 — ABNORMAL HIGH (ref 0.00–1.49)
Prothrombin Time: 26.1 seconds — ABNORMAL HIGH (ref 11.6–15.2)

## 2011-11-01 MED ORDER — WARFARIN SODIUM 4 MG PO TABS
4.0000 mg | ORAL_TABLET | Freq: Once | ORAL | Status: AC
  Administered 2011-11-01: 4 mg via ORAL
  Filled 2011-11-01: qty 1

## 2011-11-01 MED ORDER — BISACODYL 10 MG RE SUPP
10.0000 mg | Freq: Every day | RECTAL | Status: DC | PRN
  Administered 2011-11-02: 10 mg via RECTAL
  Filled 2011-11-01 (×2): qty 1

## 2011-11-01 NOTE — Progress Notes (Signed)
Physical Therapy Treatment Patient Details Name: [name] [name] MRN: [id number] DOB: 1934/09/03 Today's Date: 11/01/2011  PT Assessment/Plan  PT - Assessment/Plan Comments on Treatment Session: Pt s/p RTKA. Progress continues to be limited by pain and fatique PT Plan: Discharge plan remains appropriate PT Frequency: 7X/week Follow Up Recommendations: Skilled nursing facility Equipment Recommended: Defer to next venue PT Goals  Acute Rehab PT Goals PT Goal: Supine/Side to Sit - Progress: Progressing toward goal PT Goal: Sit to Stand - Progress: Progressing toward goal PT Goal: Stand to Sit - Progress: Progressing toward goal PT Goal: Ambulate - Progress: Progressing toward goal PT Goal: Perform Home Exercise Program - Progress: Progressing toward goal  PT Treatment Precautions/Restrictions  Precautions Precautions: Posterior Hip Precaution Booklet Issued: No Precaution Comments: Pt unable to recall any precautions. Reviewed precautions befoer and after with patient.  Required Braces or Orthoses: No Restrictions Weight Bearing Restrictions: Yes RLE Weight Bearing: Weight bearing as tolerated Mobility (including Balance) Bed Mobility Supine to Sit: 4: Min assist;With rails;HOB elevated (Comment degrees) (20%) Supine to Sit Details (indicate cue type and reason): A required for RLE for positioning and to maintain hip precautions. Cues for safe tecnique Sitting - Scoot to Edge of Bed: 4: Min assist Sitting - Scoot to Edge of Bed Details (indicate cue type and reason): A with use of chuck pad. Cues for weight shifting for efficient scooting.  Transfers Sit to Stand: 3: Mod assist;From chair/3-in-1;From bed;With upper extremity assist Sit to Stand Details (indicate cue type and reason): Cues for safe hand placement and cues for technique Stand to Sit: 4: Min assist;With upper extremity assist;With armrests;To chair/3-in-1 Stand to Sit Details: A to control descent into chair. Cues for  safe positioning.  Ambulation/Gait Ambulation/Gait Assistance: 4: Min assist Ambulation/Gait Assistance Details (indicate cue type and reason): A for RW management. Pt able to take smaill steps today but fatiques very quickly Ambulation Distance (Feet): 12 Feet Assistive device: Rolling walker Gait Pattern: Step-to pattern;Decreased stride length;Trunk flexed    Exercise  Total Joint Exercises Ankle Circles/Pumps: AROM;Right;10 reps Quad Sets: AROM;Right;10 reps Short Arc Quad: Right;10 reps;AAROM Heel Slides: AAROM;Right;10 reps Hip ABduction/ADduction: AAROM;Right;10 reps End of Session PT - End of Session Equipment Utilized During Treatment: Gait belt Activity Tolerance: Patient limited by fatigue;Patient limited by pain Patient left: in chair;with call bell in reach Nurse Communication: Mobility status for transfers;Mobility status for ambulation General Behavior During Session: [facility] for tasks performed Cognition: [facility] for tasks performed  Jayion Schneck, Adline Potter 11/01/2011, 2:02 PM 11/01/2011 Fredrich Birks PTA [phone] pager [phone] office

## 2011-11-01 NOTE — Progress Notes (Signed)
Admit Complaint: s/p R THA  Anticoagulation: 78 yo AAF on Coumadin for VTE px s/p R THA. ABLA - H/H up after 2 units PRBC 1/23. No bleeding noted. INR 1.24 -> 2.02-> 1.95 > 2.06> 2.35 today  (warf points 3). SNF Monday or when bed is available (Didn't leave yesterday)  Plan 1. coumadin 4mg  po x1 today

## 2011-11-01 NOTE — Progress Notes (Signed)
Subjective: 5 Days Post-Op Procedure(s) (LRB): TOTAL HIP ARTHROPLASTY (Right) Patient reports pain as mild and moderate.   States that she is better an is ready to go to SNF Objective: Vital signs in last 24 hours: Temp:  [97.5 F (36.4 C)-99 F (37.2 C)] 97.5 F (36.4 C) (01/26 0607) Pulse Rate:  [52-54] 52  (01/26 0607) Resp:  [16-18] 18  (01/26 0607) BP: (106-123)/(42-64) 123/64 mmHg (01/26 0607) SpO2:  [96 %-100 %] 96 % (01/26 0607)  Intake/Output from previous day: 01/25 0701 - 01/26 0700 In: 720 [P.O.:720] Out: -  Intake/Output this shift:     Basename 10/30/11 0635  HGB 10.5*    Basename 10/30/11 0635  WBC 12.3*  RBC 3.40*  HCT 30.7*  PLT 181    Basename 10/31/11 0545 10/30/11 1619  NA 134* 132*  K 4.1 4.3  CL 99 97  CO2 26 25  BUN 12 14  CREATININE 0.72 0.75  GLUCOSE 120* 108*  CALCIUM 8.8 8.9    Basename 11/01/11 0615 10/31/11 0545  LABPT -- --  INR 2.35* 2.06*    Sensation intact distally Dorsiflexion/Plantar flexion intact Incision: dressing C/D/I and no drainage  Assessment/Plan: 5 Days Post-Op Procedure(s) (LRB): TOTAL HIP ARTHROPLASTY (Right) Advance diet Up with therapy Discharge to SNF on Monday, or when bed available.  Haskel Khan 11/01/2011, 10:38 AM

## 2011-11-01 NOTE — Progress Notes (Addendum)
Pt to be transferred to [facility] today, however no auth from [employer] for SNF, per Environmental education officer at [employer]. SNF to call when auth obatined. Pt, pt's family, MD, and RN aware of NO d/c today.  Dellie Burns, MSW, [place] [phone] (weekend)

## 2011-11-02 LAB — PROTIME-INR: INR: 2.63 — ABNORMAL HIGH (ref 0.00–1.49)

## 2011-11-02 MED ORDER — WARFARIN SODIUM 4 MG PO TABS
4.0000 mg | ORAL_TABLET | Freq: Once | ORAL | Status: AC
  Administered 2011-11-02: 4 mg via ORAL
  Filled 2011-11-02: qty 1

## 2011-11-02 MED ORDER — BISACODYL 10 MG RE SUPP: 10.0000 mg | Freq: Every day | RECTAL | Status: AC | PRN

## 2011-11-02 NOTE — Progress Notes (Signed)
Procedure(s) (LRB): TOTAL HIP ARTHROPLASTY (Right) 6 Days Post-Op   Subjective:  Patient reports pain as minimal. She wants to leave the hospital.  Objective:   VITALS:  BP 136/82  Pulse 51  Temp(Src) 97.7 F (36.5 C) (Oral)  Resp 20  Ht 4\' 10"  (1.473 m)  Wt 65.817 kg (145 lb 1.6 oz)  BMI 30.33 kg/m2  SpO2 90%  EHL and FHL are firing. Her dressings are clean and intact.  LABS Lab Results  Component Value Date   HGB 10.5* 10/30/2011   HGB 7.5* 10/29/2011   HGB 9.4* 10/28/2011   Lab Results  Component Value Date   WBC 12.3* 10/30/2011   PLT 181 10/30/2011   Lab Results  Component Value Date   INR 2.63* 11/02/2011   Lab Results  Component Value Date   NA 134* 10/31/2011   K 4.1 10/31/2011   CL 99 10/31/2011   CO2 26 10/31/2011   BUN 12 10/31/2011   CREATININE 0.72 10/31/2011   GLUCOSE 120* 10/31/2011    Assessment/Plan: Principal Problem:  *Primary osteoarthritis of right hip  acute blood loss anemia improved, hyponatremia improved, anticoagulated for DVT prophylaxis on Coumadin. Her hypertension is still appropriate controlled, despite holding her ACE inhibitor and some of her other hypertension medicines.  Skilled nursing facility, when bed available. Probably tomorrow.   Tippi Mccrae P 11/02/2011, 7:14 AM

## 2011-11-02 NOTE — Progress Notes (Signed)
Admit Complaint: s/p R THA  Anticoagulation: 78 yo AAF on Coumadin for VTE px s/p R THA. ABLA - H/H up after 2 units PRBC 1/23. No bleeding noted. INR 1.24 -> 2.02-> 1.95 > 2.06> 2.35 > 2.63 today (warf points 3). SNF Monday or when bed is available (Didn't leave yesterday)  Plan 1. coumadin 4mg  po x1 today

## 2011-11-02 NOTE — Progress Notes (Signed)
Physical Therapy Treatment Patient Details Name: [name] [name] MRN: [id number] DOB: [birth year] Today's Date: 11/02/2011  PT Assessment/Plan  PT - Assessment/Plan Comments on Treatment Session: Pt very pleasant & willing to participate in therapy.  Required decreased physical (A) from therapist today.  Fatigues very quickly with ambulation.   PT Plan: Discharge plan remains appropriate PT Frequency: 7X/week Follow Up Recommendations: Skilled nursing facility Equipment Recommended: Defer to next venue PT Goals  Acute Rehab PT Goals PT Goal: Sit to Stand - Progress: Progressing toward goal PT Goal: Stand to Sit - Progress: Progressing toward goal PT Goal: Ambulate - Progress: Progressing toward goal PT Goal: Perform Home Exercise Program - Progress: Progressing toward goal  PT Treatment Precautions/Restrictions  Precautions Precautions: Posterior Hip Precaution Booklet Issued: No Precaution Comments: Pt independently verbalized 3/3 back hip precautions.  Required Braces or Orthoses: No Restrictions Weight Bearing Restrictions: No RLE Weight Bearing: Weight bearing as tolerated Mobility (including Balance) Bed Mobility Bed Mobility: No Transfers Sit to Stand: Other (comment);From chair/3-in-1;With armrests;With upper extremity assist (Min Guard (A)) Sit to Stand Details (indicate cue type and reason): Cues for hand placement & positioning of RLE.   Stand to Sit: Other (comment);To chair/3-in-1;With upper extremity assist;With armrests (Min Guard (A)) Stand to Sit Details: cues for hand placement & safe technique.   Ambulation/Gait Ambulation/Gait Assistance: Other (comment) (Min Guard (A)) Ambulation/Gait Assistance Details (indicate cue type and reason): Cues for sequencing, advancement of RW, encouragement to increase step length, cues for proper breathing. Barely clears floor during swing phase.  Pt fatiques quickly.   Ambulation Distance (Feet): 25 Feet Assistive device:  Rolling walker Gait Pattern: Decreased step length - right;Decreased step length - left;Decreased stride length;Antalgic;Decreased stance time - right Stairs: No Wheelchair Mobility Wheelchair Mobility: No    Exercise  Total Joint Exercises Ankle Circles/Pumps: AROM;Both;10 reps;Seated Quad Sets: AROM;Strengthening;10 reps;Seated Gluteal Sets: AROM;Strengthening;10 reps;Seated Long Arc Quad: Strengthening;AROM;Right;Seated Marching in Standing: Strengthening;AROM;Right;10 reps;Standing End of Session PT - End of Session Equipment Utilized During Treatment: Gait belt Activity Tolerance: Patient limited by fatigue Patient left: in chair;with call bell in reach General Behavior During Session: [facility] for tasks performed Cognition: [facility] for tasks performed  Lara Mulch 11/02/2011, 1:34 PM [phone]

## 2011-11-03 LAB — PROTIME-INR
INR: 3.16 — ABNORMAL HIGH (ref 0.00–1.49)
Prothrombin Time: 32.9 seconds — ABNORMAL HIGH (ref 11.6–15.2)

## 2011-11-03 NOTE — Progress Notes (Signed)
Subjective: 7 Days Post-Op Procedure(s) (LRB): TOTAL HIP ARTHROPLASTY (Right) Patient reports pain as mild and moderate.   She is wanting to know when she is going to be transferred to SNF Objective: Vital signs in last 24 hours: Temp:  [98.4 F (36.9 C)-98.9 F (37.2 C)] 98.4 F (36.9 C) (01/28 0515) Pulse Rate:  [51-63] 56  (01/28 0520) Resp:  [18] 18  (01/28 0515) BP: (120-124)/(69-72) 120/72 mmHg (01/28 0515) SpO2:  [95 %-96 %] 96 % (01/28 0515)  Intake/Output from previous day: 01/27 0701 - 01/28 0700 In: -  Out: 400 [Urine:400] Intake/Output this shift:    No results found for this basename: HGB:5 in the last 72 hours No results found for this basename: WBC:2,RBC:2,HCT:2,PLT:2 in the last 72 hours No results found for this basename: NA:2,K:2,CL:2,CO2:2,BUN:2,CREATININE:2,GLUCOSE:2,CALCIUM:2 in the last 72 hours  Basename 11/03/11 0500 11/02/11 0500  LABPT -- --  INR 3.16* 2.63*    Sensation intact distally Dorsiflexion/Plantar flexion intact Incision: dressing C/D/I and no drainage  Assessment/Plan: 7 Days Post-Op Procedure(s) (LRB): TOTAL HIP ARTHROPLASTY (Right) Advance diet Up with therapy Discharge to SNF  DOUGLAS Janace Litten 11/03/2011, 9:36 AM

## 2011-11-03 NOTE — Progress Notes (Signed)
[facility] SNF has obtained insurance pre-auth and is able to admit pt today, 11/03/11. CSW informed pt, as well as pt's son. Pt's son will be completing admission paperwork at facility. CSW has faxed discharge summary and AVS to facility. Pt is agreeable to discharge plan. Bedside RN is aware. CSW will continue to facilitate discharge to SNF today.   Dede Query, MSW, [place] [phone]

## 2011-11-03 NOTE — Progress Notes (Signed)
Occupational Therapy Treatment Patient Details Name: [name] [name] MRN: [id number] DOB: 03/22/1934 Today's Date: 11/03/2011  OT Assessment/Plan OT Assessment/Plan Comments on Treatment Session: Pt. progressing well and anticipates D/C to ST-SNF soon. OT Plan: Discharge plan remains appropriate OT Frequency: Min 2X/week Follow Up Recommendations: Skilled nursing facility Equipment Recommended: Defer to next venue OT Goals Acute Rehab OT Goals OT Goal Formulation: With patient Time For Goal Achievement: 2 weeks ADL Goals Pt Will Perform Grooming: with set-up;Sitting, edge of bed ADL Goal: Grooming - Progress: Met Pt Will Perform Upper Body Bathing: with set-up;with supervision;Sitting, edge of bed ADL Goal: Upper Body Bathing - Progress: Not met Pt Will Perform Lower Body Bathing: with mod assist;with max assist;Sitting, edge of bed ADL Goal: Lower Body Bathing - Progress: Not met Pt Will Perform Upper Body Dressing: with set-up;with supervision;Sitting, bed ADL Goal: Upper Body Dressing - Progress: Met Pt Will Perform Lower Body Dressing: with mod assist;with max assist;Sitting, bed;with adaptive equipment ADL Goal: Lower Body Dressing - Progress: Not met Pt Will Transfer to Toilet: with modified independence;Ambulation;3-in-1 ADL Goal: Toilet Transfer - Progress: Progressing toward goals Pt Will Perform Toileting - Clothing Manipulation: with modified independence;Standing ADL Goal: Toileting - Clothing Manipulation - Progress: Progressing toward goals Pt Will Perform Toileting - Hygiene: with modified independence;Standing at 3-in-1/toilet ADL Goal: Toileting - Hygiene - Progress: Progressing toward goals  OT Treatment Precautions/Restrictions  Precautions Precautions: Posterior Hip Precaution Booklet Issued: No Precaution Comments: Pt independently verbalized 3/3 posterior hip precautions.  Required Braces or Orthoses: No Restrictions Weight Bearing Restrictions: Yes RLE  Weight Bearing: Weight bearing as tolerated   ADL ADL Grooming: Performed;Wash/dry hands;Supervision/safety Grooming Details (indicate cue type and reason): Min verbal cues for safety with RW Where Assessed - Grooming: Standing at sink Upper Body Dressing: Performed;Set up Upper Body Dressing Details (indicate cue type and reason): with donning gown Where Assessed - Upper Body Dressing: Sitting, bed Toilet Transfer: Simulated;Supervision/safety Toilet Transfer Details (indicate cue type and reason): Min verbal cues for hand placement on arm rests Toilet Transfer Method: Ambulating Toilet Transfer Equipment: Other (comment) (recliner) Equipment Used: Rolling walker Ambulation Related to ADLs: Pt. close supervision ~25' with RW ADL Comments: Pt. educated on ADLs with following precautions. Pt. completed ADL tasks standing at sink with close supervision provided ~5 mins.  Mobility  Bed Mobility Bed Mobility: Yes Supine to Sit: 4: Min assist;With rails;HOB flat Supine to Sit Details (indicate cue type and reason): Assist for trunk to translate anterior.  Cues for sequence.  Pt able to maintain posterior hip precautions. Sitting - Scoot to Edge of Bed: 6: Modified independent (Device/Increase time) Sit to Supine: Not Tested (comment) Transfers Sit to Stand: 5: Supervision;With upper extremity assist;From bed Sit to Stand Details (indicate cue type and reason): Verbal cues for safest sequence. Stand to Sit: 5: Supervision;With upper extremity assist;To chair/3-in-1 Stand to Sit Details: Verbal cues for safest hand placement.   End of Session OT - End of Session Equipment Utilized During Treatment: Gait belt Activity Tolerance: Patient tolerated treatment well Patient left: in chair;with call bell in reach General Behavior During Session: [facility] for tasks performed Cognition: [facility] for tasks performed  Cassandria Anger, OTR/L Pager [phone]  11/03/2011, 9:48 AM

## 2011-11-03 NOTE — Progress Notes (Signed)
Physical Therapy Treatment Patient Details Name: [name] [name] MRN: [id number] DOB: [birth year] Today's Date: 11/03/2011  PT Assessment/Plan  PT - Assessment/Plan Comments on Treatment Session: Pt admitted s/p right THA and is progressing very well.  Pt very motivated increasing gait distance and independence today.  Co-treatment with OT. PT Plan: Discharge plan remains appropriate;Frequency remains appropriate PT Frequency: 7X/week Follow Up Recommendations: Skilled nursing facility Equipment Recommended: Defer to next venue PT Goals  Acute Rehab PT Goals PT Goal Formulation: With patient Time For Goal Achievement: 7 days PT Goal: Supine/Side to Sit - Progress: Progressing toward goal PT Goal: Sit to Stand - Progress: Met PT Goal: Stand to Sit - Progress: Met PT Goal: Ambulate - Progress: Progressing toward goal PT Goal: Perform Home Exercise Program - Progress: Progressing toward goal  PT Treatment Precautions/Restrictions  Precautions Precautions: Posterior Hip Precaution Booklet Issued: No Precaution Comments: Pt independently verbalized 3/3 posterior hip precautions.  Required Braces or Orthoses: No Restrictions Weight Bearing Restrictions: Yes RLE Weight Bearing: Weight bearing as tolerated Pain 0/10 with treatment. Mobility (including Balance) Bed Mobility Bed Mobility: Yes Supine to Sit: 4: Min assist;With rails;HOB flat Supine to Sit Details (indicate cue type and reason): Assist for trunk to translate anterior.  Cues for sequence.  Pt able to maintain posterior hip precautions. Sitting - Scoot to Edge of Bed: 6: Modified independent (Device/Increase time) Sit to Supine: Not Tested (comment) Transfers Transfers: Yes Sit to Stand: 5: Supervision;With upper extremity assist;From bed Sit to Stand Details (indicate cue type and reason): Verbal cues for safest sequence. Stand to Sit: 5: Supervision;With upper extremity assist;To chair/3-in-1 Stand to Sit Details:  Verbal cues for safest hand placement. Ambulation/Gait Ambulation/Gait: Yes Ambulation/Gait Assistance: 4: Min assist (Min (guard)) Ambulation/Gait Assistance Details (indicate cue type and reason): Guarding for balance only with cues to extend trunk and for safe sequence especially with turning. Ambulation Distance (Feet): 78 Feet Assistive device: Rolling walker Gait Pattern: Decreased step length - right;Decreased step length - left;Trunk flexed Stairs: No Wheelchair Mobility Wheelchair Mobility: No  Posture/Postural Control Posture/Postural Control: No significant limitations Balance Balance Assessed: No Exercise  Total Joint Exercises Ankle Circles/Pumps: AROM;Right;10 reps;Supine Quad Sets: AROM;Right;10 reps;Supine Short Arc Quad: AROM;Right;10 reps;Supine Heel Slides: AAROM;Right;10 reps;Supine Hip ABduction/ADduction: AROM;Right;10 reps;Supine Long Arc Quad: AROM;Right;10 reps;Seated End of Session PT - End of Session Equipment Utilized During Treatment: Gait belt Activity Tolerance: Patient tolerated treatment well Patient left: in chair;with call bell in reach Nurse Communication: Mobility status for transfers;Mobility status for ambulation General Behavior During Session: [facility] for tasks performed Cognition: [facility] for tasks performed  Cephus Shelling 11/03/2011, 9:07 AM  11/03/2011 Cephus Shelling, PT, DPT [phone]

## 2011-11-03 NOTE — Progress Notes (Signed)
Pt is ready for discharge. [facility] are ready to admit pt. Pt and family are agreeable to discharge plan. Pt will be transported by [facility]. CSW is signing off as no further clinical social work needs identified.   Dede Query, MSW, [facility] [phone]

## 2011-11-03 NOTE — Progress Notes (Signed)
Previously written dc summary updated.  DC order to SNF updated in computer.

## 2011-11-03 NOTE — Progress Notes (Signed)
ANTICOAGULATION CONSULT NOTE - Follow Up Consult  Pharmacy Consult for Coumadin Indication: VTE prophylaxis s/p R THA   No Known Allergies  Patient Measurements: Height: 4\' 10"  (147.3 cm) Weight: 145 lb 1.6 oz (65.817 kg) (from preadmit entry 10/1611) IBW/kg (Calculated) : 40.9 wt 65.8 kg  Vital Signs: Temp: 98.4 F (36.9 C) (01/28 0515) Temp src: Oral (01/28 0515) BP: 120/72 mmHg (01/28 0515) Pulse Rate: 56  (01/28 0520)  Labs:  Basename 11/03/11 0500 11/02/11 0500 11/01/11 0615  HGB -- -- --  HCT -- -- --  PLT -- -- --  APTT -- -- --  LABPROT 32.9* 28.5* 26.1*  INR 3.16* 2.63* 2.35*  HEPARINUNFRC -- -- --  CREATININE -- -- --  CKTOTAL -- -- --  CKMB -- -- --  TROPONINI -- -- --   Estimated Creatinine Clearance: 48.1 ml/min (by C-G formula based on Cr of 0.72).  Assessment: 78 yo F on Coumadin for VTE px. INR increased to 3.16 today and is slightly elevated.  H/H up after  2 units PRBC on 1/23. No bleeding noted per ortho note.  Pt to be discharged today to SNF, MD wrote coumadin rx for SNF at 5 mg po daily.  MD contacted with recommendation to reduce dose to 2.5 mg po today with daily SNF follow-up, MD accepted recommendation.    Goal of Therapy:  INR 2-3   Plan:  1) Pt being discharged to SNF today, no coumadin orders entered for inpt today in anticipation of discharge.  2) MD has provided rx for warfarin 2.5 mg po daily, and SNF to adjust dosing as necessary based on daily INRs.    Brizeyda Holtmeyer, Maryagnes Amos 11/03/2011,9:36 AM

## 2011-12-11 ENCOUNTER — Encounter: Payer: Self-pay | Admitting: *Deleted

## 2011-12-15 ENCOUNTER — Encounter: Payer: Self-pay | Admitting: Cardiology

## 2011-12-15 ENCOUNTER — Ambulatory Visit (INDEPENDENT_AMBULATORY_CARE_PROVIDER_SITE_OTHER): Payer: PRIVATE HEALTH INSURANCE | Admitting: Cardiology

## 2011-12-15 DIAGNOSIS — I6529 Occlusion and stenosis of unspecified carotid artery: Secondary | ICD-10-CM

## 2011-12-15 DIAGNOSIS — E785 Hyperlipidemia, unspecified: Secondary | ICD-10-CM

## 2011-12-15 DIAGNOSIS — I10 Essential (primary) hypertension: Secondary | ICD-10-CM

## 2011-12-15 DIAGNOSIS — I509 Heart failure, unspecified: Secondary | ICD-10-CM

## 2011-12-15 DIAGNOSIS — I2510 Atherosclerotic heart disease of native coronary artery without angina pectoris: Secondary | ICD-10-CM

## 2011-12-15 DIAGNOSIS — I5032 Chronic diastolic (congestive) heart failure: Secondary | ICD-10-CM

## 2011-12-15 MED ORDER — SPIRONOLACTONE 25 MG PO TABS: 25.0000 mg | ORAL_TABLET | Freq: Every day | ORAL | Status: DC

## 2011-12-15 MED ORDER — ENALAPRIL MALEATE 20 MG PO TABS: 20.0000 mg | ORAL_TABLET | Freq: Two times a day (BID) | ORAL | Status: DC

## 2011-12-15 NOTE — Assessment & Plan Note (Signed)
BP high.  Will increase spironolactone to 25 mg daily today.  Continue enalapril, Coreg, amlodipine, Lasix.  Follow BMET.

## 2011-12-15 NOTE — Assessment & Plan Note (Signed)
Carotid bruit, will get carotid dopplers.

## 2011-12-15 NOTE — Assessment & Plan Note (Signed)
No chest pain, no ischemia on 3/11 myoview.  Continue ASA, Plavix, Coreg, ACEI, and statin. 

## 2011-12-15 NOTE — Assessment & Plan Note (Signed)
Euvolemic on exam.  Stable NYHA class II symptoms.  Continue current dose of Lasix.  Check BMET.

## 2011-12-15 NOTE — Patient Instructions (Addendum)
Increase spironolactone to 25mg  daily.  Your physician recommends that you return for a FASTING lipid profile / BMET in 2 weeks--401.9  414.01  Your physician has requested that you have a carotid duplex. This test is an ultrasound of the carotid arteries in your neck. It looks at blood flow through these arteries that supply the brain with blood. Allow one hour for this exam. There are no restrictions or special instructions. IN 2 WEEKS WHEN YOU RETURN FOR FASTING LAB  Your physician wants you to follow-up in: 6 months with Dr Shirlee Latch.(September 2013). You will receive a reminder letter in the mail two months in advance. If you don't receive a letter, please call our office to schedule the follow-up appointment.

## 2011-12-15 NOTE — Progress Notes (Signed)
PCP: Dr. Illene Regulus  78 yo with history of CAD s/p PCI returns for cardiology followup today.  She initially had depressed LV EF after her MI but this rose back to normal range on echo in 2009.  She had an echo done in 3/11 showing preserved LV systolic function and mild diastolic dysfunction.  ETT-myoview was done in 3/11 with a hypertensive BP response on the treadmill but no evidence for ischemia or infarction on images.  Since I last saw her, she had a total hip replacement in 1/13.  This was uncomplicated.  She is living with her daughter in [place] currently.    [name] [name] is walking with a walker since her hip surgery.  She feels good overall.  No exertional dyspnea walking on flat ground.  No chest pain.  BP today is 170/81.  She tells me her BP has been "good" whenever the physical therapist takes it.  Weight is up 4 lbs since last appointment.   Labs (4/11): LDL 61, HDL 42, LFTs normal Labs (1/61): K 4.5, creatinine 0.7, BNP 40 Labs (10/11): K 4.1, creatinine 0.7, LDL 69, HDL 41 Labs (4/12): K 4.4, creatinine 0.8, LDL 110, HDL 50 Labs (7/12): K 3.4, creatinine 0.8, BNP 19, LDL 50, HDL 50 Labs (1/13): K 4.1, creatinine 0.72  Allergies (verified):  No Known Drug Allergies  Past Medical History: 1. Coronary artery disease.  The patient had an acute anterior MI in March 2008.  She had 90% LAD stenosis with thrombus.  She did have 2.75 x 12 Taxus drug-eluting stent placed in the proximal LAD.  She did also have 80% first obtuse marginal stenosis, 70% distal circumflex stenosis and 50% proximal RCA stenosis.  Her EF was initially 30-35% at the time of her MI; however, it rose after primary PCI to 60%.  ETT-myoview (3/11): EF 68%, hypertensive BP response, normal perfusion at rest and stress (negative study).  2. Hypertension. 3. Hypercholesterolemia. 4. Gastroesophageal reflux disease. 5. Mild carotid stenosis less than 50% bilaterally. 6. Diastolic CHF: Echo (3/11) with EF 60-65%, mild  diastolic dysfunction, mild MR, normal RV.  7. Osteoarthritis: s/p THR  Family History: No premature CAD  Social History: The patient lives alone in an apartment in [place].  She does not smoke or drink alcohol.  Review of Systems        All systems reviewed and negative except as per HPI.   Current Outpatient Prescriptions  Medication Sig Dispense Refill  . amLODipine (NORVASC) 10 MG tablet Take 10 mg by mouth daily.      Marland Kitchen aspirin 81 MG tablet Take 81 mg by mouth daily.        . carvedilol (COREG) 25 MG tablet 25 mg. Take 1 tablet by mouth two times a day      . clopidogrel (PLAVIX) 75 MG tablet Take 75 mg by mouth daily.      . furosemide (LASIX) 40 MG tablet Take 20-40 mg by mouth 2 (two) times daily. Take 1 tablet in the morning Take 0.5 tablet every evening      . hydrochlorothiazide 25 MG tablet Take 25 mg by mouth daily.        Marland Kitchen levothyroxine (SYNTHROID, LEVOTHROID) 100 MCG tablet Take 100 mcg by mouth daily.        . potassium chloride (KLOR-CON) 8 MEQ tablet Take 8-16 mEq by mouth 2 (two) times daily. Take 2 tablets in the morning and 1 tablet in the afternoon      .  rosuvastatin (CRESTOR) 40 MG tablet Take 40 mg by mouth daily.      . traMADol (ULTRAM) 50 MG tablet Take 50 mg by mouth 2 (two) times daily as needed. For pain      . enalapril (VASOTEC) 20 MG tablet Take 1 tablet (20 mg total) by mouth 2 (two) times daily.  60 tablet  6  . spironolactone (ALDACTONE) 25 MG tablet Take 1 tablet (25 mg total) by mouth daily.  30 tablet  6  . DISCONTD: enalapril (VASOTEC) 20 MG tablet Take 1 tablet (20 mg total) by mouth 2 (two) times daily.        BP 170/81  Pulse 75  Ht 4\' 10"  (1.473 m)  Wt 152 lb (68.947 kg)  BMI 31.77 kg/m2 General:  Well developed, well nourished, in no acute distress. Neck:  Neck supple, no JVD. No masses, thyromegaly or abnormal cervical nodes. Lungs:  Clear bilaterally to auscultation and percussion. Heart:  Non-displaced PMI, chest  non-tender; regular rate and rhythm, S1, S2.  +S4. 1/6 early systolic ejection-type murmur.  Carotid upstroke normal, left bruit. Pedals normal pulses. 1+ ankle edema.   Abdomen:  Bowel sounds positive; abdomen soft and non-tender without masses, organomegaly, or hernias noted. No hepatosplenomegaly. Extremities:  No clubbing or cyanosis. Neurologic:  Alert and oriented x 3. Psych:  Normal affect.

## 2011-12-15 NOTE — Assessment & Plan Note (Signed)
Check lipids today.  Goal LDL < 70.  

## 2011-12-30 ENCOUNTER — Other Ambulatory Visit: Payer: PRIVATE HEALTH INSURANCE

## 2012-01-13 ENCOUNTER — Encounter (INDEPENDENT_AMBULATORY_CARE_PROVIDER_SITE_OTHER): Payer: PRIVATE HEALTH INSURANCE

## 2012-01-13 ENCOUNTER — Other Ambulatory Visit (INDEPENDENT_AMBULATORY_CARE_PROVIDER_SITE_OTHER): Payer: PRIVATE HEALTH INSURANCE

## 2012-01-13 DIAGNOSIS — I6529 Occlusion and stenosis of unspecified carotid artery: Secondary | ICD-10-CM

## 2012-01-13 DIAGNOSIS — I2510 Atherosclerotic heart disease of native coronary artery without angina pectoris: Secondary | ICD-10-CM

## 2012-01-13 DIAGNOSIS — I10 Essential (primary) hypertension: Secondary | ICD-10-CM

## 2012-01-13 LAB — LIPID PANEL
HDL: 33.2 mg/dL — ABNORMAL LOW (ref >39.00)
Triglycerides: 99 mg/dL (ref 0.0–149.0)

## 2012-01-13 LAB — BASIC METABOLIC PANEL
CO2: 30 mEq/L (ref 19–32)
Calcium: 9.1 mg/dL (ref 8.4–10.5)
Chloride: 100 mEq/L (ref 96–112)
Glucose, Bld: 143 mg/dL — ABNORMAL HIGH (ref 70–99)
Sodium: 139 mEq/L (ref 135–145)

## 2012-02-17 ENCOUNTER — Other Ambulatory Visit: Payer: Self-pay | Admitting: Cardiology

## 2012-04-27 ENCOUNTER — Other Ambulatory Visit: Payer: Self-pay | Admitting: Cardiology

## 2012-10-07 ENCOUNTER — Encounter: Payer: Self-pay | Admitting: Cardiology

## 2012-10-07 ENCOUNTER — Ambulatory Visit (INDEPENDENT_AMBULATORY_CARE_PROVIDER_SITE_OTHER): Payer: PRIVATE HEALTH INSURANCE | Admitting: Cardiology

## 2012-10-07 VITALS — BP 152/82 | HR 80 | Ht <= 58 in | Wt 147.0 lb

## 2012-10-07 DIAGNOSIS — E785 Hyperlipidemia, unspecified: Secondary | ICD-10-CM

## 2012-10-07 DIAGNOSIS — I5032 Chronic diastolic (congestive) heart failure: Secondary | ICD-10-CM

## 2012-10-07 DIAGNOSIS — I10 Essential (primary) hypertension: Secondary | ICD-10-CM

## 2012-10-07 DIAGNOSIS — I2510 Atherosclerotic heart disease of native coronary artery without angina pectoris: Secondary | ICD-10-CM

## 2012-10-07 LAB — LIPID PANEL
Cholesterol: 188 mg/dL (ref 0–200)
LDL Cholesterol: 123 mg/dL — ABNORMAL HIGH (ref 0–99)
Total CHOL/HDL Ratio: 4
VLDL: 17.2 mg/dL (ref 0.0–40.0)

## 2012-10-07 LAB — BASIC METABOLIC PANEL
BUN: 9 mg/dL (ref 6–23)
Chloride: 100 mEq/L (ref 96–112)
Creatinine, Ser: 0.8 mg/dL (ref 0.4–1.2)
Glucose, Bld: 124 mg/dL — ABNORMAL HIGH (ref 70–99)

## 2012-10-07 NOTE — Patient Instructions (Addendum)
Your physician recommends that you have  a FASTING lipid profile /BMET today.  Your physician wants you to follow-up in: 6 months with Dr Shirlee Latch. (July 2014).  You will receive a reminder letter in the mail two months in advance. If you don't receive a letter, please call our office to schedule the follow-up appointment.

## 2012-10-07 NOTE — Progress Notes (Signed)
Patient ID: [name] [name], female   DOB: [birth year], 79 y.o.   MRN: [id number] PCP: Dr. Illene Regulus  79 yo with history of CAD s/p PCI returns for cardiology followup today.  She initially had depressed LV EF after her MI but this rose back to normal range on echo in 2009.  She had an echo done in 3/11 showing preserved LV systolic function and mild diastolic dysfunction.  ETT-myoview was done in 3/11 with a hypertensive BP response on the treadmill but no evidence for ischemia or infarction on images.    [name] [name] is walking with a cane now.  She had THR in 1/13.  She feels good overall.  She get a little short of breath after walking about 1/2 mile on flat ground.  She can climb a flight of steps.  No chest pain.  BP is mildly elevated today and has been running in the 140s-150s systolic range when she checks at home.  Weight is down 1 lb since last appointment.  She is now off spironolactone.   Labs (4/11): LDL 61, HDL 42, LFTs normal Labs (4/09): K 4.5, creatinine 0.7, BNP 40 Labs (10/11): K 4.1, creatinine 0.7, LDL 69, HDL 41 Labs (4/12): K 4.4, creatinine 0.8, LDL 110, HDL 50 Labs (7/12): K 3.4, creatinine 0.8, BNP 19, LDL 50, HDL 50 Labs (1/13): K 4.1, creatinine 0.72 Labs (4/13): LDL 72, HDL 33, K 3.9, creatinine 0.9  ECG: ectopic atrial rhythm, lateral TWIs  Allergies (verified):  No Known Drug Allergies  Past Medical History: 1. Coronary artery disease.  The patient had an acute anterior MI in March 2008.  She had 90% LAD stenosis with thrombus.  She did have 2.75 x 12 Taxus drug-eluting stent placed in the proximal LAD.  She did also have 80% first obtuse marginal stenosis, 70% distal circumflex stenosis and 50% proximal RCA stenosis.  Her EF was initially 30-35% at the time of her MI; however, it rose after primary PCI to 60%.  ETT-myoview (3/11): EF 68%, hypertensive BP response, normal perfusion at rest and stress (negative study).  2. Hypertension. 3. Hypercholesterolemia. 4.  Gastroesophageal reflux disease. 5. Mild carotid stenosis less than 50% bilaterally. Carotid dopplers (4/13) with mild plaque bilaterally.  6. Diastolic CHF: Echo (3/11) with EF 60-65%, mild diastolic dysfunction, mild MR, normal RV.  7. Osteoarthritis: s/p THR  Family History: No premature CAD  Social History: The patient lives alone in an apartment in [place].  She does not smoke or drink alcohol.   Current Outpatient Prescriptions  Medication Sig Dispense Refill  . alendronate (FOSAMAX) 70 MG tablet Take 70 mg by mouth every 7 (seven) days. Take with a full glass of water on an empty stomach.      Marland Kitchen amLODipine (NORVASC) 10 MG tablet Take 10 mg by mouth daily.      Marland Kitchen aspirin 81 MG tablet Take 81 mg by mouth daily.        . carvedilol (COREG) 25 MG tablet 25 mg. Take 1 tablet by mouth two times a day      . enalapril (VASOTEC) 20 MG tablet Take 1 tablet (20 mg total) by mouth 2 (two) times daily.  60 tablet  6  . furosemide (LASIX) 40 MG tablet Take 40 mg by mouth daily. Take 1 tablet in the morning Take 0.5 tablet every evening      . hydrochlorothiazide 25 MG tablet Take 25 mg by mouth daily.        Marland Kitchen  HYDROcodone-acetaminophen (NORCO/VICODIN) 5-325 MG per tablet Take 1 tablet by mouth every 6 (six) hours as needed.      Marland Kitchen levothyroxine (SYNTHROID, LEVOTHROID) 100 MCG tablet Take 100 mcg by mouth daily.        . nitroGLYCERIN (NITROSTAT) 0.4 MG SL tablet Place 0.4 mg under the tongue every 5 (five) minutes as needed.      Marland Kitchen omeprazole (PRILOSEC) 20 MG capsule Take 20 mg by mouth daily.      Marland Kitchen PLAVIX 75 MG tablet TAKE 1 TABLET BY MOUTH ONCE DAILY.  30 each  3  . potassium chloride SA (K-DUR,KLOR-CON) 20 MEQ tablet Take 20 mEq by mouth daily.      . rosuvastatin (CRESTOR) 40 MG tablet Take 40 mg by mouth daily.      . traMADol (ULTRAM) 50 MG tablet TAKE 1 TABLET BY MOUTH TWICE DAILY AS NEEDED FOR PAIN  60 tablet  3  . [DISCONTINUED] warfarin (COUMADIN) 5 MG tablet Take 1 tablet  (5 mg total) by mouth daily.  30 tablet  1    BP 152/82  Pulse 80  Ht 4\' 10"  (1.473 m)  Wt 147 lb (66.679 kg)  BMI 30.72 kg/m2 General:  Well developed, well nourished, in no acute distress. Neck:  Neck supple, no JVD. No masses, thyromegaly or abnormal cervical nodes. Lungs:  Clear bilaterally to auscultation and percussion. Heart:  Non-displaced PMI, chest non-tender; regular rate and rhythm, S1, S2.  +S4. 1/6 early systolic ejection-type murmur.  Carotid upstroke normal, left bruit. Pedals normal pulses. 1+ ankle edema.   Abdomen:  Bowel sounds positive; abdomen soft and non-tender without masses, organomegaly, or hernias noted. No hepatosplenomegaly. Extremities:  No clubbing or cyanosis. Neurologic:  Alert and oriented x 3. Psych:  Normal affect.  Assessment/Plan:  CAD, NATIVE VESSEL No chest pain, no ischemia on 3/11 myoview. Continue ASA, Plavix, Coreg, ACEI, and statin. CHRONIC DIASTOLIC HEART FAILURE Euvolemic on exam. Stable NYHA class II symptoms. Continue current dose of Lasix. Check BMET today.  HYPERLIPIDEMIA-MIXED  Check lipids today. Continue Crestor with known CAD.  HYPERTENSION SBP runs typically in the 140s to 150s range.  She is on multiple antihypertensives.  I am going to tolerate this for now and will not change her regimen.   Ectopic atrial rhythm Patient appears to have an ectopic (non-sinus) atrial rhythm on ECG.  However, HR is within normal range so I do not think that there is any particular clinical significance to this.  She does not get lightheaded and has had no syncopal/presyncopal episodes.   Marca Ancona 10/07/2012 9:22 AM

## 2012-10-19 ENCOUNTER — Other Ambulatory Visit: Payer: Self-pay | Admitting: *Deleted

## 2012-10-19 MED ORDER — ROSUVASTATIN CALCIUM 40 MG PO TABS: 40.0000 mg | ORAL_TABLET | Freq: Every day | ORAL | Status: DC

## 2013-01-18 ENCOUNTER — Other Ambulatory Visit: Payer: Self-pay | Admitting: *Deleted

## 2013-01-18 DIAGNOSIS — I10 Essential (primary) hypertension: Secondary | ICD-10-CM

## 2013-01-18 DIAGNOSIS — I2510 Atherosclerotic heart disease of native coronary artery without angina pectoris: Secondary | ICD-10-CM

## 2013-01-18 MED ORDER — CLOPIDOGREL BISULFATE 75 MG PO TABS: ORAL_TABLET | ORAL | Status: DC

## 2013-01-18 MED ORDER — ENALAPRIL MALEATE 20 MG PO TABS: 20.0000 mg | ORAL_TABLET | Freq: Two times a day (BID) | ORAL | Status: DC

## 2013-06-17 ENCOUNTER — Ambulatory Visit: Payer: PRIVATE HEALTH INSURANCE | Admitting: Cardiology

## 2013-08-12 ENCOUNTER — Telehealth: Payer: Self-pay | Admitting: Cardiology

## 2013-08-12 NOTE — Telephone Encounter (Signed)
Ms [name] [name] has an appointment 08/15/13 @10 :45 with Dr Marca Ancona.  As pt requested, this will be faxed.

## 2013-08-12 NOTE — Telephone Encounter (Signed)
New Problem   Pt states she needs a letter stating she has an appt with [place] on Monday sent to the Social Security office.. Fax # [phone]

## 2013-08-15 ENCOUNTER — Encounter: Payer: Self-pay | Admitting: Cardiology

## 2013-08-15 ENCOUNTER — Ambulatory Visit (INDEPENDENT_AMBULATORY_CARE_PROVIDER_SITE_OTHER): Payer: PRIVATE HEALTH INSURANCE | Admitting: Cardiology

## 2013-08-15 VITALS — BP 166/87 | HR 77 | Wt 155.0 lb

## 2013-08-15 DIAGNOSIS — I10 Essential (primary) hypertension: Secondary | ICD-10-CM

## 2013-08-15 DIAGNOSIS — I2510 Atherosclerotic heart disease of native coronary artery without angina pectoris: Secondary | ICD-10-CM

## 2013-08-15 DIAGNOSIS — I5032 Chronic diastolic (congestive) heart failure: Secondary | ICD-10-CM

## 2013-08-15 DIAGNOSIS — E785 Hyperlipidemia, unspecified: Secondary | ICD-10-CM

## 2013-08-15 LAB — BASIC METABOLIC PANEL
BUN: 10 mg/dL (ref 6–23)
CO2: 28 mEq/L (ref 19–32)
Calcium: 9 mg/dL (ref 8.4–10.5)
Chloride: 104 mEq/L (ref 96–112)
Creatinine, Ser: 0.7 mg/dL (ref 0.4–1.2)
Glucose, Bld: 110 mg/dL — ABNORMAL HIGH (ref 70–99)

## 2013-08-15 LAB — LIPID PANEL
Cholesterol: 127 mg/dL (ref 0–200)
HDL: 49.9 mg/dL (ref >39.00)
Total CHOL/HDL Ratio: 3
Triglycerides: 81 mg/dL (ref 0.0–149.0)

## 2013-08-15 MED ORDER — HYDROCHLOROTHIAZIDE 25 MG PO TABS: ORAL_TABLET | ORAL | Status: DC

## 2013-08-15 NOTE — Telephone Encounter (Signed)
Her daughter brought her to appt today, she did not need note.

## 2013-08-15 NOTE — Patient Instructions (Signed)
Increase HCTZ(hydrochlorothazide) to 50mg  daily. This will be 2 of your 25mg  tablets daily at the same time.  Your physician recommends that you have lab work today--BMET/Lipid profile.  Your physician recommends that you return for lab work in: 2 weeks--BMET. I have given you an order for this. Please fax the results to Dr Shirlee Latch [phone].  Your physician wants you to follow-up in: 6 months with Dr Shirlee Latch. (May 2015).  You will receive a reminder letter in the mail two months in advance. If you don't receive a letter, please call our office to schedule the follow-up appointment.

## 2013-08-15 NOTE — Progress Notes (Signed)
Patient ID: [name] [name], female   DOB: 06-Apr-1934, 79 y.o.   MRN: [id number] PCP: Dr. Illene Regulus  79 yo with history of CAD s/p PCI returns for cardiology followup today.  She initially had depressed LV EF after her MI but this rose back to normal range on echo in 2009.  She had an echo done in 3/11 showing preserved LV systolic function and mild diastolic dysfunction.  ETT-myoview was done in 3/11 with a hypertensive BP response on the treadmill but no evidence for ischemia or infarction on images.    She is stable symptomatically.  Now with pain in her left hip and may need replacement.  She walks with a cane.  She gets fatigued with housework but not short of breath.  She sweeps and vacuums in the house and does some gardening.  No chest pain.  No orthopnea, PND, or tachypalpitations.  BP is high today. At home, SBP has been running in the 160s-170s.    Labs (4/11): LDL 61, HDL 42, LFTs normal Labs (8/46): K 4.5, creatinine 0.7, BNP 40 Labs (10/11): K 4.1, creatinine 0.7, LDL 69, HDL 41 Labs (4/12): K 4.4, creatinine 0.8, LDL 110, HDL 50 Labs (7/12): K 3.4, creatinine 0.8, BNP 19, LDL 50, HDL 50 Labs (1/13): K 4.1, creatinine 0.72 Labs (4/13): LDL 72, HDL 33, K 3.9, creatinine 0.9 Labs (1/14): K 3.5, creatinine 0.8, LDL 123, HDL 48  ECG: ectopic atrial rhythm, nonspecific diffuse T wave flattening.   Allergies (verified):  No Known Drug Allergies  Past Medical History: 1. Coronary artery disease.  The patient had an acute anterior MI in March 2008.  She had 90% LAD stenosis with thrombus.  She did have 2.75 x 12 Taxus drug-eluting stent placed in the proximal LAD.  She did also have 80% first obtuse marginal stenosis, 70% distal circumflex stenosis and 50% proximal RCA stenosis.  Her EF was initially 30-35% at the time of her MI; however, it rose after primary PCI to 60%.  ETT-myoview (3/11): EF 68%, hypertensive BP response, normal perfusion at rest and stress (negative study).  2.  Hypertension. 3. Hypercholesterolemia. 4. Gastroesophageal reflux disease. 5. Mild carotid stenosis less than 50% bilaterally. Carotid dopplers (4/13) with mild plaque bilaterally.  6. Diastolic CHF: Echo (3/11) with EF 60-65%, mild diastolic dysfunction, mild MR, normal RV.  7. Osteoarthritis: s/p right THR  Family History: No premature CAD  Social History: The patient lives alone in an apartment in [place].  She does not smoke or drink alcohol.  ROS:  All systems reviewed and negative except as per HPI.    Current Outpatient Prescriptions  Medication Sig Dispense Refill  . alendronate (FOSAMAX) 70 MG tablet Take 70 mg by mouth every 7 (seven) days. Take with a full glass of water on an empty stomach.      Marland Kitchen amLODipine (NORVASC) 10 MG tablet Take 10 mg by mouth daily.      Marland Kitchen aspirin 81 MG tablet Take 81 mg by mouth daily.        . carvedilol (COREG) 25 MG tablet 25 mg. Take 1 tablet by mouth two times a day      . clopidogrel (PLAVIX) 75 MG tablet TAKE 1 TABLET BY MOUTH ONCE DAILY.  30 tablet  5  . enalapril (VASOTEC) 20 MG tablet Take 1 tablet (20 mg total) by mouth 2 (two) times daily.  60 tablet  6  . furosemide (LASIX) 40 MG tablet Take 1 tablet in the morning  Take 0.5 tablet every evening      . HYDROcodone-acetaminophen (NORCO/VICODIN) 5-325 MG per tablet Take 1 tablet by mouth every 6 (six) hours as needed.      Marland Kitchen levothyroxine (SYNTHROID, LEVOTHROID) 100 MCG tablet Take 100 mcg by mouth daily.        . nitroGLYCERIN (NITROSTAT) 0.4 MG SL tablet Place 0.4 mg under the tongue every 5 (five) minutes as needed.      Marland Kitchen omeprazole (PRILOSEC) 20 MG capsule Take 20 mg by mouth daily.      . potassium chloride SA (K-DUR,KLOR-CON) 20 MEQ tablet Take 20 mEq by mouth daily.      . rosuvastatin (CRESTOR) 40 MG tablet Take 1 tablet (40 mg total) by mouth daily.  30 tablet  6  . traMADol (ULTRAM) 50 MG tablet TAKE 1 TABLET BY MOUTH TWICE DAILY AS NEEDED FOR PAIN  60 tablet  3  .  hydrochlorothiazide (HYDRODIURIL) 25 MG tablet 2 tablets (total 50mg ) daily  60 tablet  6  . [DISCONTINUED] warfarin (COUMADIN) 5 MG tablet Take 1 tablet (5 mg total) by mouth daily.  30 tablet  1   No current facility-administered medications for this visit.    BP 166/87  Pulse 77  Wt 70.308 kg (155 lb) General:  Well developed, well nourished, in no acute distress. Neck:  Neck supple, no JVD. No masses, thyromegaly or abnormal cervical nodes. Lungs:  Clear bilaterally to auscultation and percussion. Heart:  Non-displaced PMI, chest non-tender; regular rate and rhythm, S1, S2.  +S4. 1/6 early systolic ejection-type murmur.  Carotid upstroke normal, left bruit. Pedals normal pulses. No edema.   Abdomen:  Bowel sounds positive; abdomen soft and non-tender without masses, organomegaly, or hernias noted. No hepatosplenomegaly. Extremities:  No clubbing or cyanosis. Neurologic:  Alert and oriented x 3. Psych:  Normal affect.  Assessment/Plan:  CAD, NATIVE VESSEL No chest pain, no ischemia on 3/11 myoview. Continue ASA, Plavix, Coreg, ACEI, and statin. CHRONIC DIASTOLIC HEART FAILURE Euvolemic on exam. Stable NYHA class II symptoms. Continue current dose of Lasix, BMET today. HYPERLIPIDEMIA-MIXED  Check lipids today.  When last checked, she was out of Crestor.  She is taking it regularly now.   HYPERTENSION SBP running 160s-170s, too high.  I will increase her HCTZ to 50 mg daily.  She will check BP daily and we will call for readings in 2 wks.  Need BMET in 2 wks.    Ectopic atrial rhythm Patient appears to have an ectopic (non-sinus) atrial rhythm on ECG.  However, HR is within normal range so I do not think that there is any particular clinical significance to this.  She does not get lightheaded and has had no syncopal/presyncopal episodes.  This has been chronic.    Marca Ancona 08/15/2013 1:52 PM

## 2013-08-17 NOTE — Progress Notes (Signed)
Pt's grandson notified. 

## 2013-09-12 ENCOUNTER — Encounter: Payer: Self-pay | Admitting: Cardiology

## 2014-04-15 ENCOUNTER — Other Ambulatory Visit: Payer: Self-pay | Admitting: Orthopedic Surgery

## 2014-04-20 ENCOUNTER — Ambulatory Visit (INDEPENDENT_AMBULATORY_CARE_PROVIDER_SITE_OTHER): Payer: PRIVATE HEALTH INSURANCE | Admitting: Physician Assistant

## 2014-04-20 ENCOUNTER — Encounter: Payer: Self-pay | Admitting: Physician Assistant

## 2014-04-20 VITALS — BP 150/80 | HR 59 | Ht <= 58 in | Wt 150.4 lb

## 2014-04-20 DIAGNOSIS — I2510 Atherosclerotic heart disease of native coronary artery without angina pectoris: Secondary | ICD-10-CM

## 2014-04-20 DIAGNOSIS — I10 Essential (primary) hypertension: Secondary | ICD-10-CM

## 2014-04-20 DIAGNOSIS — I5032 Chronic diastolic (congestive) heart failure: Secondary | ICD-10-CM

## 2014-04-20 DIAGNOSIS — R9431 Abnormal electrocardiogram [ECG] [EKG]: Secondary | ICD-10-CM

## 2014-04-20 DIAGNOSIS — E785 Hyperlipidemia, unspecified: Secondary | ICD-10-CM

## 2014-04-20 DIAGNOSIS — Z01810 Encounter for preprocedural cardiovascular examination: Secondary | ICD-10-CM

## 2014-04-20 MED ORDER — NITROGLYCERIN 0.4 MG SL SUBL: 0.4000 mg | SUBLINGUAL_TABLET | SUBLINGUAL | Status: DC | PRN

## 2014-04-20 NOTE — Patient Instructions (Signed)
Your physician has requested that you have a lexiscan myoview. For further information please visit [email or web address] Please follow instruction sheet, as given.   Your physician recommends that you schedule a follow-up appointment in:  3 months with Dr. Marca Anconaalton McLean.  Please ask your surgeon if they want you to stop Plavix for surgery.  I would like to see you remain on Aspirin during your surgery if the surgeon agrees.  I will send him my note from today.

## 2014-04-20 NOTE — Progress Notes (Signed)
Cardiology Office Note    Date:  04/20/2014   ID:  [name] [name], DOB 16-Mar-1934, MRN [id number]  PCP:  [name] Queen, MD  Cardiologist:  Dr. Marca Ancona      History of Present Illness: [name] [name] is a 80 y.o. female with a hx of CAD s/p PCI. She initially had depressed LV EF after her MI but this rose back to normal range on echo in 2009. She had an echo done in 3/11 showing preserved LV systolic function and mild diastolic dysfunction. ETT-myoview was done in 3/11 with a hypertensive BP response on the treadmill but no evidence for ischemia or infarction on images.  Last seen by Dr. Marca Ancona in 08/2013.    She needs left hip replacement by Dr. Dion Saucier.  This is scheduled for 05/09/14. The patient is very limited by her left hip pain. She cannot achieve 4 METs. She denies chest pain. She has chronic dyspnea with exertion. She is NYHA IIb. She denies orthopnea, PND. She has chronic pedal edema without significant change. She denies syncope. Of note, on review of her medications, she is no longer on amlodipine or carvedilol. Her baseline heart rate is 59 without AV nodal blocking agents. She tells me that these medications made her sick in the past. I suspect she had significant bradycardia with beta blocker therapy thus necessitating discontinuation of this medication.   Studies:  - Echo (12/18/09):  EF 60% to 65%. Normal wall motion, grade 1 diastolic dysfunction, mild MR, mild BAE, normal RV function   Recent Labs: 08/15/2013: Creatinine 0.7; HDL Cholesterol by NMR 49.90; LDL (calc) 61; Potassium 3.7   Wt Readings from Last 3 Encounters:  04/20/14 150 lb 6.4 oz (68.221 kg)  08/15/13 155 lb (70.308 kg)  10/07/12 147 lb (66.679 kg)     Past Medical History:  1. Coronary artery disease. The patient had an acute anterior MI in March 2008. She had 90% LAD stenosis with thrombus. She did have 2.75 x 12 Taxus drug-eluting stent placed in the proximal LAD. She did also have  80% first obtuse marginal stenosis, 70% distal circumflex stenosis and 50% proximal RCA stenosis. Her EF was initially 30-35% at the time of her MI; however, it rose after primary PCI to 60%. ETT-myoview (3/11): EF 68%, hypertensive BP response, normal perfusion at rest and stress (negative study).  2. Hypertension.  3. Hypercholesterolemia.  4. Gastroesophageal reflux disease.  5. Mild carotid stenosis less than 50% bilaterally. Carotid dopplers (4/13) with mild plaque bilaterally.  6. Diastolic CHF: Echo (3/11) with EF 60-65%, mild diastolic dysfunction, mild MR, normal RV.  7. Osteoarthritis: s/p right THR   Past Medical History  Diagnosis Date  . CAD (coronary artery disease)     acute MI in 3/08 - taxus stent  . Hypercholesteremia     takes Crestor daily  . GERD (gastroesophageal reflux disease)   . Carotid stenosis     mild  . HTN (hypertension)     takes Amlodpine and Carvedilol daily  . Dry cough   . Bronchitis     in Dec  . Ankle edema     pt lasix and hctz daily  . Headache(784.0)     occasionally  . Arthritis     right hip  . Myocardial infarction     pt unsure but thinks about 88yrs ago;is taking Plavix daily   . Constipation     takes OTC stool softener as needed  . Nocturia   .  Hypothyroidism     takes Synthroid daily  . Diabetes mellitus     pt doesn't take any meds for it  . Cataract     left;not ready to come off  . Primary osteoarthritis of right hip 10/27/2011  . DJD (degenerative joint disease) of hip     right    Current Outpatient Prescriptions  Medication Sig Dispense Refill  . aspirin 81 MG tablet Take 81 mg by mouth daily.        . clopidogrel (PLAVIX) 75 MG tablet TAKE 1 TABLET BY MOUTH ONCE DAILY.  30 tablet  5  . enalapril (VASOTEC) 20 MG tablet Take 1 tablet (20 mg total) by mouth 2 (two) times daily.  60 tablet  6  . furosemide (LASIX) 40 MG tablet Take 1 tablet in the morning Take 0.5 tablet every evening      . hydrochlorothiazide  (HYDRODIURIL) 25 MG tablet Take 25 mg by mouth daily.       Marland Kitchen. levothyroxine (SYNTHROID, LEVOTHROID) 100 MCG tablet Take 100 mcg by mouth daily.        . nitroGLYCERIN (NITROSTAT) 0.4 MG SL tablet Place 1 tablet (0.4 mg total) under the tongue every 5 (five) minutes as needed.  25 tablet  3  . potassium chloride SA (K-DUR,KLOR-CON) 20 MEQ tablet Take 20 mEq by mouth daily.      . rosuvastatin (CRESTOR) 40 MG tablet Take 1 tablet (40 mg total) by mouth daily.  30 tablet  6  . [DISCONTINUED] warfarin (COUMADIN) 5 MG tablet Take 1 tablet (5 mg total) by mouth daily.  30 tablet  1   No current facility-administered medications for this visit.    Allergies:   Review of patient's allergies indicates no known allergies.   Social History:  The patient  reports that she has never smoked. She does not have any smokeless tobacco history on file. She reports that she does not drink alcohol or use illicit drugs.   Family History:  The patient's family history includes Diabetes in her brother; Heart attack in her brother and sister; Hyperlipidemia in her sister; Hypertension in her brother, daughter, father, mother, sister, and son; Stroke in her mother. There is no history of Anesthesia problems, Hypotension, Malignant hyperthermia, or Pseudochol deficiency.   ROS:  Please see the history of present illness.      All other systems reviewed and negative.   PHYSICAL EXAM: VS:  BP 150/80  Pulse 59  Ht 4\' 10"  (1.473 m)  Wt 150 lb 6.4 oz (68.221 kg)  BMI 31.44 kg/m2 Well nourished, well developed, in no acute distress HEENT: normal Neck: no JVDat 90 Cardiac:  normal S1, S2; RRR; no murmur Lungs:  clear to auscultation bilaterally, no wheezing, rhonchi or rales Abd: soft, nontender, no hepatomegaly Ext: trace bilateral LE edema Skin: warm and dry Neuro:  CNs 2-12 intact, no focal abnormalities noted  EKG:  Sinus bradycardia, HR 59, normal axis, unusual P axis, lateral T wave inversions      ASSESSMENT AND PLAN:  1. Pre-operative cardiovascular examination:  The patient has a history of myocardial infarction in 2008 treated with a Taxus DES. She had residual 80% stenosis in the OM and 70% in the distal circumflex as well as 50% proximal RCA. Her last Myoview in 2011 with low risk. I cannot assess her functional status as she is quite limited by her left hip pain. Arrange Lexiscan Myoview for risk stratification. Further recommendations to follow. 2. Atherosclerosis of native  coronary artery of native heart without angina pectoris:  Arrange Myoview as noted. She remains on aspirin and Plavix. If her Myoview is low risk, she may hold Plavix for her procedure and resume postoperatively when felt to be safe. Ideally she should remain on aspirin throughout the perioperative period. She has a first generation drug eluting stent. Discontinuation of all antiplatelet agents do place her at a higher risk of late stent thrombosis. However, if her bleeding risk is too great to remain on aspirin, this should be resumed postoperatively when felt to be safe.  She is not on beta blocker due to bradycardia. Continue statin. 3. Chronic diastolic heart failure:   Volume stable. Continue current therapy. Close attention will need to be paid to her volume status in the perioperative period. 4. HYPERTENSION, UNSPECIFIED:  She has not taken any medications yet today. Her blood pressure is somewhat elevated. Continue to monitor. 5. HYPERLIPIDEMIA-MIXED:  Continue statin. 6. Disposition: Followup with Dr. Shirlee Latch in 3 months.   Signed, Brynda Rim, MHS 04/20/2014 12:18 PM    [facility] [address] [place], [place], [place]  [address] Phone: [phone]; Fax: [phone]

## 2014-04-25 NOTE — Progress Notes (Addendum)
Anesthesia Chart Review: Patient is a 80 year old female scheduled for left THA on 05/09/14 by Dr. Dion SaucierLandau.  History includes CAD, anterolateral STEMI s/p DES to proximal LAD 10/19/06 with ischemic cardiomyopathy (EF35% '08, now EF => 55%), LE edema (on Lasix), GERD, hypercholesterolemia, HTN, 0-39% bilateral ICA carotid stenosis by 01/12/13 duplex, hypothyroidism, DM2 diet controlled, cataracts, headaches, arthritis, back surgery, hysterectomy, right THR 10/27/11. Of note, history suggests she had a CABG, but this is not mentioned in Dr. Alford HighlandMcLean's note, and I can't find any evidence of this. PCP is Dr. Karrie DoffingWilliam Selvidge.   Cardiologist Dr. Shirlee LatchMcLean.  She was seen by Tereso NewcomerScott Weaver, PA-C on 04/20/14 for follow-up and preoperative evaluation.  A nuclear stress test is ordered for 05/02/14. If it low risk, he felt she would hold her Plavix for surgery.  Ideally she should remain on ASA perioperatively unless bleeding risk for to great.    EKG on 04/20/14 showed: Sinus bradycardia at 59 bpm, septal infarct (age undetermined), ST/T wave abnormality, consider inferolateral ischemia.   Echo on 12/18/09 showed: Normal LV size and systolic function, EF 60-65%. Grade 1 diastolic dysfunction. Mild mitral regurgitation. Normal RV size and systolic function.  Her last cath was on 10/19/2006 with the below findings:  1. LV: 143/10/13. EF 35% with anterolateral, apical, and distal inferior akinesis.  2. No aortic stenosis or mitral regurgitation.  3. Left main: Angiographically normal.  4. LAD: A very large vessel which wraps the apex of the heart to supply the majority of the inferior wall. There was a 99% thrombotic stenosis of the proximal vessel which was treated with aspiration thrombectomy and placement of a drug-eluting stent, reducing the stenosis to 0% and improving flow from TIMI-2 to TIMI- 3.  5. Circumflex: Moderate-size vessel giving rise to two marginals. The first marginal has an 80% stenosis proximally. This vessel  was small. The small distal AV groove portion of the circumflex has a 70% stenosis.  6 RCA: Small though technically dominant vessel with long 50% stenosis proximally.  IMPRESSION AND RECOMMENDATIONS: Successful percutaneous revascularization of the culprit lesion in the proximal left anterior descending (LAD) using aspiration thrombectomy and a drug-eluting stent.   She is for CXR and labs at PAT on 04/26/14 and a stress test on 05/02/14.  I'll follow-up results once available.  Velna Ochsllison Zelenak, PA-C [facility]iology Phone [phone] 04/25/2014 5:46 PM  Addendum 05/03/2014 9:34 AM Labs and CXR noted from 04/26/14. She told her PAT RN that she was already told to continue ASA, but hold Plavix starting 05/02/14.  She had a nuclear stress test on 05/02/14 with results showing: Overall Impression: Normal stress nuclear study. there is no scar or ischemia. This is a low risk scan. LV Ejection Fraction: 64%. LV Wall Motion: Normal Wall Motion.  If no acute changes then plan to proceed.

## 2014-04-25 NOTE — Pre-Procedure Instructions (Signed)
[name] [name]  04/25/2014   Your procedure is scheduled on:  Tuesday, May 09, 2014 at 10:14 AM.  [facility] at 8:10 AM.  Call this number if you have problems the morning of surgery: [phone]   Remember:   Do not eat food or drink liquids after midnight Monday, May 08, 2014   Take these medicines the morning of surgery with A SIP OF WATER:  Acetaminophen (Tylenol) if needed for pain,  levothyroxine (SYNTHROID)    Do not wear jewelry, make-up or nail polish.  Do not wear lotions, powders, or perfumes.   Do not shave 48 hours prior to surgery.   Do not bring valuables to the hospital.  [facility] is not responsible for any belongings or valuables.               Contacts, dentures or bridgework may not be worn into surgery.  Leave suitcase in the car. After surgery it may be brought to your room.  For patients admitted to the hospital, discharge time is determined by your treatment team.               Patients discharged the day of surgery will not be allowed to drive home.  Name and phone number of your driver:   Special Instructions: Shower using CHG the night before surgery and the morning of surgery.   Please read over the following fact sheets that you were given: Pain Booklet, Coughing and Deep Breathing, Blood Transfusion Information, MRSA Information and Surgical Site Infection Prevention

## 2014-04-26 ENCOUNTER — Encounter (HOSPITAL_COMMUNITY): Payer: Self-pay

## 2014-04-26 ENCOUNTER — Encounter (HOSPITAL_COMMUNITY)
Admission: RE | Admit: 2014-04-26 | Discharge: 2014-04-26 | Disposition: A | Discharge status: Home / Self Care | Payer: PRIVATE HEALTH INSURANCE | Source: Ambulatory Visit | Attending: Orthopedic Surgery | Admitting: Orthopedic Surgery

## 2014-04-26 DIAGNOSIS — Z01818 Encounter for other preprocedural examination: Secondary | ICD-10-CM | POA: Insufficient documentation

## 2014-04-26 DIAGNOSIS — Z01812 Encounter for preprocedural laboratory examination: Secondary | ICD-10-CM | POA: Diagnosis not present

## 2014-04-26 LAB — APTT: aPTT: 32 seconds (ref 24–37)

## 2014-04-26 LAB — CBC
HCT: 39.9 % (ref 36.0–46.0)
Hemoglobin: 12.9 g/dL (ref 12.0–15.0)
MCH: 31 pg (ref 26.0–34.0)
MCHC: 32.3 g/dL (ref 30.0–36.0)
MCV: 95.9 fL (ref 78.0–100.0)
PLATELETS: 238 10*3/uL (ref 150–400)
RBC: 4.16 MIL/uL (ref 3.87–5.11)
RDW: 13.5 % (ref 11.5–15.5)
WBC: 5.1 10*3/uL (ref 4.0–10.5)

## 2014-04-26 LAB — BASIC METABOLIC PANEL
ANION GAP: 12 (ref 5–15)
BUN: 11 mg/dL (ref 6–23)
CO2: 27 mEq/L (ref 19–32)
Calcium: 9 mg/dL (ref 8.4–10.5)
Chloride: 101 mEq/L (ref 96–112)
Creatinine, Ser: 0.65 mg/dL (ref 0.50–1.10)
GFR, EST NON AFRICAN AMERICAN: 83 mL/min — AB (ref >90)
Glucose, Bld: 101 mg/dL — ABNORMAL HIGH (ref 70–99)
POTASSIUM: 3.5 meq/L — AB (ref 3.7–5.3)
SODIUM: 140 meq/L (ref 137–147)

## 2014-04-26 LAB — TYPE AND SCREEN
ABO/RH(D): O POS
Antibody Screen: NEGATIVE

## 2014-04-26 LAB — SURGICAL PCR SCREEN
MRSA, PCR: NEGATIVE
STAPHYLOCOCCUS AUREUS: NEGATIVE

## 2014-04-26 LAB — PROTIME-INR
INR: 0.99 (ref 0.00–1.49)
Prothrombin Time: 13.1 seconds (ref 11.6–15.2)

## 2014-04-26 NOTE — Progress Notes (Signed)
PCP is Karrie DoffingWilliam Selvidge [phone]) and Cardiologist is Marca Anconaalton McLean. Patient denied having any acute cardiac or pulmonary issues. When asked about Plavix patient stated "I am suppose to continue my aspirin and stop Plavix on Tuesday July 28th, seven days before my surgery. I am having a stress test on Tuesday the 28th." Patient informed Nurse that she has not used Nitroglycerin in approximately 2 years.

## 2014-04-27 ENCOUNTER — Encounter: Payer: Self-pay | Admitting: Cardiology

## 2014-05-02 ENCOUNTER — Ambulatory Visit (HOSPITAL_COMMUNITY): Payer: PRIVATE HEALTH INSURANCE | Attending: Cardiology | Admitting: Radiology

## 2014-05-02 VITALS — BP 125/70 | Ht <= 58 in | Wt 142.0 lb

## 2014-05-02 DIAGNOSIS — R9431 Abnormal electrocardiogram [ECG] [EKG]: Secondary | ICD-10-CM

## 2014-05-02 DIAGNOSIS — Z8249 Family history of ischemic heart disease and other diseases of the circulatory system: Secondary | ICD-10-CM | POA: Diagnosis not present

## 2014-05-02 DIAGNOSIS — R42 Dizziness and giddiness: Secondary | ICD-10-CM | POA: Insufficient documentation

## 2014-05-02 DIAGNOSIS — Z9861 Coronary angioplasty status: Secondary | ICD-10-CM | POA: Insufficient documentation

## 2014-05-02 DIAGNOSIS — I2510 Atherosclerotic heart disease of native coronary artery without angina pectoris: Secondary | ICD-10-CM

## 2014-05-02 DIAGNOSIS — Z01818 Encounter for other preprocedural examination: Secondary | ICD-10-CM | POA: Insufficient documentation

## 2014-05-02 DIAGNOSIS — Z833 Family history of diabetes mellitus: Secondary | ICD-10-CM | POA: Diagnosis not present

## 2014-05-02 DIAGNOSIS — R0989 Other specified symptoms and signs involving the circulatory and respiratory systems: Secondary | ICD-10-CM | POA: Insufficient documentation

## 2014-05-02 DIAGNOSIS — R0602 Shortness of breath: Secondary | ICD-10-CM

## 2014-05-02 DIAGNOSIS — Z951 Presence of aortocoronary bypass graft: Secondary | ICD-10-CM | POA: Insufficient documentation

## 2014-05-02 DIAGNOSIS — R0609 Other forms of dyspnea: Secondary | ICD-10-CM | POA: Diagnosis not present

## 2014-05-02 DIAGNOSIS — Z01810 Encounter for preprocedural cardiovascular examination: Secondary | ICD-10-CM

## 2014-05-02 DIAGNOSIS — I252 Old myocardial infarction: Secondary | ICD-10-CM | POA: Insufficient documentation

## 2014-05-02 MED ORDER — TECHNETIUM TC 99M SESTAMIBI GENERIC - CARDIOLITE
30.0000 | Freq: Once | INTRAVENOUS | Status: AC | PRN
  Administered 2014-05-02: 30 via INTRAVENOUS

## 2014-05-02 MED ORDER — REGADENOSON 0.4 MG/5ML IV SOLN
0.4000 mg | Freq: Once | INTRAVENOUS | Status: AC
  Administered 2014-05-02: 0.4 mg via INTRAVENOUS

## 2014-05-02 MED ORDER — TECHNETIUM TC 99M SESTAMIBI GENERIC - CARDIOLITE
10.0000 | Freq: Once | INTRAVENOUS | Status: AC | PRN
  Administered 2014-05-02: 10 via INTRAVENOUS

## 2014-05-02 NOTE — Progress Notes (Signed)
[facility] SITE 3 NUCLEAR MED [address] [place], [place] [address] [phone]    Cardiology Nuclear Med Study  Shelle IronClara G [name] is a 80 y.o. female     MRN : [id number]     DOB: 09/11/1934  Procedure Date: 05/02/2014  Nuclear Med Background Indication for Stress Test:  Evaluation for Ischemia, Surgical Clearance: Left TKR with Dr. Dion SaucierLandau on 05/09/14 and Stent Patency History:CAD;MI (08');Cath;STent (LAD);KGM:WNUUCHF:Echo 3/11" EF:60-65%;Previous Nuclear Study 3/11, Nml EF:68% Cardiac Risk Factors: Carotid Disease, Family History - CAD, Hypertension, Lipids and NIDDM  Symptoms:  Dizziness and DOE   Nuclear Pre-Procedure Caffeine/Decaff Intake:  12:00am NPO After: 12:00am   Lungs:  clear O2 Sat: 97% on room air. IV 0.9% NS with Angio Cath:  22g  IV Site: R Hand  IV Started by:  Cathlyn Parsonsynthia Hasspacher, RN  Chest Size (in):  34 Cup Size: A  Height: 4\' 10"  (1.473 m)  Weight:  142 lb (64.411 kg)  BMI:  Body mass index is 29.69 kg/(m^2). Tech Comments:  n/a    Nuclear Med Study 1 or 2 day study: 1 day  Stress Test Type:  Lexiscan  Reading MD: n/a  Order Authorizing Provider:  Fransico Meadowalton McLean,MD and Azucena KubaScott Weaver,PAC  Resting Radionuclide: Technetium 758m Sestamibi  Resting Radionuclide Dose: 11.0 mCi   Stress Radionuclide:  Technetium 658m Sestamibi  Stress Radionuclide Dose: 33.0 mCi           Stress Protocol Rest HR: 65 Stress HR: 81  Rest BP: 125/70 Stress BP: 149/73  Exercise Time (min): n/a METS: n/a   Predicted Max HR: 142 bpm % Max HR: 57.04 bpm Rate Pressure Product: [id number]   Dose of Adenosine (mg):  n/a Dose of Lexiscan: 0.4 mg  Dose of Atropine (mg): n/a Dose of Dobutamine: n/a mcg/kg/min (at max HR)  Stress Test Technologist: Frederick Peerseresa Johnson, EMT-P  Nuclear Technologist:  Harlow AsaElizabeth Young, CNMT     Rest Procedure:  Myocardial perfusion imaging was performed at rest 45 minutes following the intravenous administration of Technetium 4058m Sestamibi. Rest ECG: Normal sinus  rhythm. Diffuse nonspecific ST-T wave changes.  Stress Procedure:  The patient received IV Lexiscan 0.4 mg over 15-seconds.  Technetium 2558m Sestamibi injected at 30-seconds.  Quantitative spect images were obtained after a 45 minute delay. Stress ECG: No significant change from baseline ECG  QPS Raw Data Images:  Normal; no motion artifact; normal heart/lung ratio. Stress Images:  Normal homogeneous uptake in all areas of the myocardium. Rest Images:  Normal homogeneous uptake in all areas of the myocardium. Subtraction (SDS):  No evidence of ischemia. Transient Ischemic Dilatation (Normal <1.22):  1.07 Lung/Heart Ratio (Normal <0.45):  0.25  Quantitative Gated Spect Images QGS EDV:  78 ml QGS ESV:  28 ml  Impression Exercise Capacity:  Lexiscan with no exercise. BP Response:  Normal blood pressure response. Clinical Symptoms:  Shortness of breath ECG Impression:  No significant ST segment change suggestive of ischemia. Comparison with Prior Nuclear Study: No significant change from previous study  Overall Impression:  Normal stress nuclear study. there is no scar or ischemia. This is a low risk scan.  LV Ejection Fraction: 64%.  LV Wall Motion:  Normal Wall Motion.  Willa RoughJeffrey Daviana Haymaker, MD

## 2014-05-03 ENCOUNTER — Encounter: Payer: Self-pay | Admitting: Physician Assistant

## 2014-05-04 ENCOUNTER — Telehealth: Payer: Self-pay | Admitting: *Deleted

## 2014-05-04 NOTE — Telephone Encounter (Signed)
pt notified about normal myoview and I will fax to surgeon Dr. Dion SaucierLandau, pt said thank you

## 2014-05-08 MED ORDER — CEFAZOLIN SODIUM-DEXTROSE 2-3 GM-% IV SOLR
2.0000 g | INTRAVENOUS | Status: AC
  Administered 2014-05-09: 2 g via INTRAVENOUS
  Filled 2014-05-08: qty 50

## 2014-05-09 ENCOUNTER — Inpatient Hospital Stay (HOSPITAL_COMMUNITY): Payer: Medicare HMO

## 2014-05-09 ENCOUNTER — Encounter (HOSPITAL_COMMUNITY): Payer: Medicare HMO | Admitting: Vascular Surgery

## 2014-05-09 ENCOUNTER — Encounter (HOSPITAL_COMMUNITY): Payer: Self-pay | Admitting: Surgery

## 2014-05-09 ENCOUNTER — Encounter (HOSPITAL_COMMUNITY)
Admission: RE | Disposition: A | Discharge status: Skilled Nursing Facility | Payer: Self-pay | Source: Ambulatory Visit | Attending: Orthopedic Surgery

## 2014-05-09 ENCOUNTER — Inpatient Hospital Stay (HOSPITAL_COMMUNITY): Payer: Medicare HMO | Admitting: Certified Registered Nurse Anesthetist

## 2014-05-09 ENCOUNTER — Inpatient Hospital Stay (HOSPITAL_COMMUNITY)
Admission: RE | Admit: 2014-05-09 | Discharge: 2014-05-15 | DRG: 470 | Disposition: A | Discharge status: Skilled Nursing Facility | Payer: Medicare HMO | Source: Ambulatory Visit | Attending: Orthopedic Surgery | Admitting: Orthopedic Surgery

## 2014-05-09 DIAGNOSIS — M169 Osteoarthritis of hip, unspecified: Secondary | ICD-10-CM | POA: Diagnosis not present

## 2014-05-09 DIAGNOSIS — I2510 Atherosclerotic heart disease of native coronary artery without angina pectoris: Secondary | ICD-10-CM | POA: Diagnosis present

## 2014-05-09 DIAGNOSIS — K9189 Other postprocedural complications and disorders of digestive system: Secondary | ICD-10-CM

## 2014-05-09 DIAGNOSIS — K219 Gastro-esophageal reflux disease without esophagitis: Secondary | ICD-10-CM | POA: Diagnosis present

## 2014-05-09 DIAGNOSIS — Y9210 Unspecified residential institution as the place of occurrence of the external cause: Secondary | ICD-10-CM | POA: Diagnosis not present

## 2014-05-09 DIAGNOSIS — E119 Type 2 diabetes mellitus without complications: Secondary | ICD-10-CM | POA: Diagnosis present

## 2014-05-09 DIAGNOSIS — E7800 Pure hypercholesterolemia, unspecified: Secondary | ICD-10-CM | POA: Diagnosis present

## 2014-05-09 DIAGNOSIS — I10 Essential (primary) hypertension: Secondary | ICD-10-CM | POA: Diagnosis present

## 2014-05-09 DIAGNOSIS — E039 Hypothyroidism, unspecified: Secondary | ICD-10-CM | POA: Diagnosis present

## 2014-05-09 DIAGNOSIS — M1610 Unilateral primary osteoarthritis, unspecified hip: Principal | ICD-10-CM | POA: Diagnosis present

## 2014-05-09 DIAGNOSIS — K560 Paralytic ileus: Secondary | ICD-10-CM | POA: Diagnosis not present

## 2014-05-09 DIAGNOSIS — Z823 Family history of stroke: Secondary | ICD-10-CM

## 2014-05-09 DIAGNOSIS — Z9861 Coronary angioplasty status: Secondary | ICD-10-CM | POA: Diagnosis not present

## 2014-05-09 DIAGNOSIS — Z833 Family history of diabetes mellitus: Secondary | ICD-10-CM

## 2014-05-09 DIAGNOSIS — I252 Old myocardial infarction: Secondary | ICD-10-CM | POA: Diagnosis not present

## 2014-05-09 DIAGNOSIS — Z951 Presence of aortocoronary bypass graft: Secondary | ICD-10-CM

## 2014-05-09 DIAGNOSIS — Z7901 Long term (current) use of anticoagulants: Secondary | ICD-10-CM

## 2014-05-09 DIAGNOSIS — Z8249 Family history of ischemic heart disease and other diseases of the circulatory system: Secondary | ICD-10-CM | POA: Diagnosis not present

## 2014-05-09 DIAGNOSIS — Z96649 Presence of unspecified artificial hip joint: Secondary | ICD-10-CM | POA: Diagnosis not present

## 2014-05-09 DIAGNOSIS — Z79899 Other long term (current) drug therapy: Secondary | ICD-10-CM | POA: Diagnosis not present

## 2014-05-09 DIAGNOSIS — K929 Disease of digestive system, unspecified: Secondary | ICD-10-CM | POA: Diagnosis not present

## 2014-05-09 DIAGNOSIS — Y831 Surgical operation with implant of artificial internal device as the cause of abnormal reaction of the patient, or of later complication, without mention of misadventure at the time of the procedure: Secondary | ICD-10-CM | POA: Diagnosis not present

## 2014-05-09 DIAGNOSIS — M1612 Unilateral primary osteoarthritis, left hip: Secondary | ICD-10-CM

## 2014-05-09 DIAGNOSIS — K567 Ileus, unspecified: Secondary | ICD-10-CM | POA: Diagnosis not present

## 2014-05-09 HISTORY — DX: Other postprocedural complications and disorders of digestive system: K91.89

## 2014-05-09 HISTORY — DX: Unilateral primary osteoarthritis, left hip: M16.12

## 2014-05-09 HISTORY — DX: Ileus, unspecified: K56.7

## 2014-05-09 HISTORY — PX: TOTAL HIP ARTHROPLASTY: SHX124

## 2014-05-09 LAB — GLUCOSE, CAPILLARY
GLUCOSE-CAPILLARY: 119 mg/dL — AB (ref 70–99)
GLUCOSE-CAPILLARY: 130 mg/dL — AB (ref 70–99)
GLUCOSE-CAPILLARY: 163 mg/dL — AB (ref 70–99)
Glucose-Capillary: 115 mg/dL — ABNORMAL HIGH (ref 70–99)

## 2014-05-09 SURGERY — ARTHROPLASTY, HIP, TOTAL,POSTERIOR APPROACH
Anesthesia: General | Site: Hip | Laterality: Left

## 2014-05-09 MED ORDER — SODIUM CHLORIDE 0.9 % IV SOLN
10.0000 mg | INTRAVENOUS | Status: DC | PRN
  Administered 2014-05-09: 20 ug/min via INTRAVENOUS

## 2014-05-09 MED ORDER — SODIUM CHLORIDE 0.9 % IR SOLN
Status: DC | PRN
  Administered 2014-05-09: 1000 mL

## 2014-05-09 MED ORDER — PHENOL 1.4 % MT LIQD: 1.0000 | OROMUCOSAL | Status: DC | PRN

## 2014-05-09 MED ORDER — MAGNESIUM CITRATE PO SOLN: 1.0000 | Freq: Once | ORAL | Status: AC | PRN

## 2014-05-09 MED ORDER — ROCURONIUM BROMIDE 100 MG/10ML IV SOLN
INTRAVENOUS | Status: DC | PRN
  Administered 2014-05-09: 50 mg via INTRAVENOUS

## 2014-05-09 MED ORDER — LACTATED RINGERS IV SOLN
INTRAVENOUS | Status: DC
  Administered 2014-05-09: 10:00:00 via INTRAVENOUS

## 2014-05-09 MED ORDER — OXYCODONE HCL 5 MG PO TABS
5.0000 mg | ORAL_TABLET | ORAL | Status: DC | PRN
  Administered 2014-05-09 – 2014-05-15 (×17): 10 mg via ORAL
  Filled 2014-05-09 (×17): qty 2

## 2014-05-09 MED ORDER — METOCLOPRAMIDE HCL 5 MG PO TABS
5.0000 mg | ORAL_TABLET | Freq: Three times a day (TID) | ORAL | Status: DC | PRN
  Administered 2014-05-12: 10 mg via ORAL
  Filled 2014-05-09 (×2): qty 2

## 2014-05-09 MED ORDER — DIPHENHYDRAMINE HCL 12.5 MG/5ML PO ELIX
12.5000 mg | ORAL_SOLUTION | ORAL | Status: DC | PRN
  Administered 2014-05-10: 25 mg via ORAL
  Filled 2014-05-09 (×2): qty 10

## 2014-05-09 MED ORDER — ONDANSETRON HCL 4 MG/2ML IJ SOLN
4.0000 mg | Freq: Four times a day (QID) | INTRAMUSCULAR | Status: DC | PRN
  Administered 2014-05-12 – 2014-05-15 (×3): 4 mg via INTRAVENOUS
  Filled 2014-05-09 (×3): qty 2

## 2014-05-09 MED ORDER — METHOCARBAMOL 500 MG PO TABS
500.0000 mg | ORAL_TABLET | Freq: Four times a day (QID) | ORAL | Status: DC | PRN
  Administered 2014-05-09 – 2014-05-15 (×5): 500 mg via ORAL
  Filled 2014-05-09 (×6): qty 1

## 2014-05-09 MED ORDER — POTASSIUM CHLORIDE IN NACL 20-0.45 MEQ/L-% IV SOLN
INTRAVENOUS | Status: DC
  Administered 2014-05-12: 21:00:00 via INTRAVENOUS
  Filled 2014-05-09 (×8): qty 1000

## 2014-05-09 MED ORDER — PNEUMOCOCCAL VAC POLYVALENT 25 MCG/0.5ML IJ INJ
0.5000 mL | INJECTION | INTRAMUSCULAR | Status: AC
  Administered 2014-05-10: 0.5 mL via INTRAMUSCULAR
  Filled 2014-05-09: qty 0.5

## 2014-05-09 MED ORDER — OXYCODONE-ACETAMINOPHEN 5-325 MG PO TABS: 1.0000 | ORAL_TABLET | Freq: Four times a day (QID) | ORAL | Status: DC | PRN

## 2014-05-09 MED ORDER — OXYCODONE HCL 5 MG PO TABS
ORAL_TABLET | ORAL | Status: AC
  Filled 2014-05-09: qty 2

## 2014-05-09 MED ORDER — ONDANSETRON HCL 4 MG/2ML IJ SOLN: 4.0000 mg | Freq: Once | INTRAMUSCULAR | Status: DC | PRN

## 2014-05-09 MED ORDER — OXYCODONE HCL 5 MG/5ML PO SOLN: 5.0000 mg | Freq: Once | ORAL | Status: DC | PRN

## 2014-05-09 MED ORDER — BISACODYL 10 MG RE SUPP: 10.0000 mg | Freq: Every day | RECTAL | Status: DC | PRN

## 2014-05-09 MED ORDER — SENNA 8.6 MG PO TABS
1.0000 | ORAL_TABLET | Freq: Two times a day (BID) | ORAL | Status: DC
  Administered 2014-05-10 – 2014-05-15 (×10): 8.6 mg via ORAL
  Filled 2014-05-09 (×13): qty 1

## 2014-05-09 MED ORDER — LACTATED RINGERS IV SOLN
INTRAVENOUS | Status: DC | PRN
  Administered 2014-05-09 (×2): via INTRAVENOUS

## 2014-05-09 MED ORDER — LIDOCAINE HCL (CARDIAC) 20 MG/ML IV SOLN
INTRAVENOUS | Status: DC | PRN
  Administered 2014-05-09: 80 mg via INTRAVENOUS

## 2014-05-09 MED ORDER — DEXAMETHASONE SODIUM PHOSPHATE 10 MG/ML IJ SOLN
10.0000 mg | Freq: Three times a day (TID) | INTRAMUSCULAR | Status: AC
  Filled 2014-05-09 (×3): qty 1

## 2014-05-09 MED ORDER — DOCUSATE SODIUM 100 MG PO CAPS
100.0000 mg | ORAL_CAPSULE | Freq: Two times a day (BID) | ORAL | Status: DC
  Administered 2014-05-10 – 2014-05-15 (×10): 100 mg via ORAL
  Filled 2014-05-09 (×12): qty 1

## 2014-05-09 MED ORDER — HYDROCHLOROTHIAZIDE 25 MG PO TABS
25.0000 mg | ORAL_TABLET | Freq: Every day | ORAL | Status: DC
  Administered 2014-05-10 – 2014-05-15 (×6): 25 mg via ORAL
  Filled 2014-05-09 (×7): qty 1

## 2014-05-09 MED ORDER — FUROSEMIDE 20 MG PO TABS
20.0000 mg | ORAL_TABLET | Freq: Two times a day (BID) | ORAL | Status: DC
Start: 2014-05-09 — End: 2014-05-09

## 2014-05-09 MED ORDER — ACETAMINOPHEN 650 MG RE SUPP: 650.0000 mg | Freq: Four times a day (QID) | RECTAL | Status: DC | PRN

## 2014-05-09 MED ORDER — FUROSEMIDE 40 MG PO TABS
40.0000 mg | ORAL_TABLET | Freq: Every day | ORAL | Status: DC
  Administered 2014-05-10 – 2014-05-15 (×6): 40 mg via ORAL
  Filled 2014-05-09 (×7): qty 1

## 2014-05-09 MED ORDER — HYDROMORPHONE HCL PF 1 MG/ML IJ SOLN
0.2500 mg | INTRAMUSCULAR | Status: DC | PRN
  Administered 2014-05-09 (×2): 0.5 mg via INTRAVENOUS

## 2014-05-09 MED ORDER — FENTANYL CITRATE 0.05 MG/ML IJ SOLN
INTRAMUSCULAR | Status: AC
  Filled 2014-05-09: qty 5

## 2014-05-09 MED ORDER — HYDROMORPHONE HCL PF 1 MG/ML IJ SOLN
0.5000 mg | INTRAMUSCULAR | Status: DC | PRN
  Administered 2014-05-10 – 2014-05-12 (×2): 0.5 mg via INTRAVENOUS
  Filled 2014-05-09 (×2): qty 1

## 2014-05-09 MED ORDER — HYDROMORPHONE HCL PF 1 MG/ML IJ SOLN
INTRAMUSCULAR | Status: AC
  Filled 2014-05-09: qty 1

## 2014-05-09 MED ORDER — BUPIVACAINE HCL (PF) 0.25 % IJ SOLN
INTRAMUSCULAR | Status: DC | PRN
  Administered 2014-05-09: 12 mL

## 2014-05-09 MED ORDER — ONDANSETRON HCL 4 MG PO TABS: 4.0000 mg | ORAL_TABLET | Freq: Three times a day (TID) | ORAL | Status: DC | PRN

## 2014-05-09 MED ORDER — GLYCOPYRROLATE 0.2 MG/ML IJ SOLN
INTRAMUSCULAR | Status: DC | PRN
  Administered 2014-05-09: 0.4 mg via INTRAVENOUS

## 2014-05-09 MED ORDER — BUPIVACAINE HCL (PF) 0.25 % IJ SOLN
INTRAMUSCULAR | Status: AC
  Filled 2014-05-09: qty 30

## 2014-05-09 MED ORDER — BACLOFEN 10 MG PO TABS: 10.0000 mg | ORAL_TABLET | Freq: Three times a day (TID) | ORAL | Status: DC

## 2014-05-09 MED ORDER — MEPERIDINE HCL 25 MG/ML IJ SOLN: 6.2500 mg | INTRAMUSCULAR | Status: DC | PRN

## 2014-05-09 MED ORDER — DEXAMETHASONE 6 MG PO TABS
10.0000 mg | ORAL_TABLET | Freq: Three times a day (TID) | ORAL | Status: AC
  Administered 2014-05-10 (×2): 10 mg via ORAL
  Filled 2014-05-09 (×3): qty 1

## 2014-05-09 MED ORDER — FUROSEMIDE 20 MG PO TABS
20.0000 mg | ORAL_TABLET | Freq: Every day | ORAL | Status: DC
  Administered 2014-05-10 – 2014-05-14 (×3): 20 mg via ORAL
  Filled 2014-05-09 (×7): qty 1

## 2014-05-09 MED ORDER — POLYETHYLENE GLYCOL 3350 17 G PO PACK
17.0000 g | PACK | Freq: Every day | ORAL | Status: DC | PRN
  Administered 2014-05-11: 17 g via ORAL
  Filled 2014-05-09: qty 1

## 2014-05-09 MED ORDER — CEFAZOLIN SODIUM-DEXTROSE 2-3 GM-% IV SOLR
2.0000 g | Freq: Four times a day (QID) | INTRAVENOUS | Status: AC
  Administered 2014-05-09 – 2014-05-10 (×2): 2 g via INTRAVENOUS
  Filled 2014-05-09 (×2): qty 50

## 2014-05-09 MED ORDER — ONDANSETRON HCL 4 MG/2ML IJ SOLN
INTRAMUSCULAR | Status: DC | PRN
  Administered 2014-05-09: 4 mg via INTRAVENOUS

## 2014-05-09 MED ORDER — METHOCARBAMOL 1000 MG/10ML IJ SOLN
500.0000 mg | Freq: Four times a day (QID) | INTRAVENOUS | Status: DC | PRN
  Filled 2014-05-09: qty 5

## 2014-05-09 MED ORDER — METOCLOPRAMIDE HCL 5 MG/ML IJ SOLN
5.0000 mg | Freq: Three times a day (TID) | INTRAMUSCULAR | Status: DC | PRN
  Administered 2014-05-09 – 2014-05-12 (×2): 10 mg via INTRAVENOUS
  Filled 2014-05-09 (×4): qty 2

## 2014-05-09 MED ORDER — ONDANSETRON HCL 4 MG PO TABS
4.0000 mg | ORAL_TABLET | Freq: Four times a day (QID) | ORAL | Status: DC | PRN
Start: 2014-05-09 — End: 2014-05-15

## 2014-05-09 MED ORDER — RIVAROXABAN 10 MG PO TABS
10.0000 mg | ORAL_TABLET | Freq: Every day | ORAL | Status: DC
  Administered 2014-05-10 – 2014-05-15 (×6): 10 mg via ORAL
  Filled 2014-05-09 (×8): qty 1

## 2014-05-09 MED ORDER — OXYCODONE HCL 5 MG PO TABS: 5.0000 mg | ORAL_TABLET | Freq: Once | ORAL | Status: DC | PRN

## 2014-05-09 MED ORDER — ATORVASTATIN CALCIUM 80 MG PO TABS
80.0000 mg | ORAL_TABLET | Freq: Every day | ORAL | Status: DC
  Administered 2014-05-10 – 2014-05-14 (×4): 80 mg via ORAL
  Filled 2014-05-09 (×7): qty 1

## 2014-05-09 MED ORDER — LEVOTHYROXINE SODIUM 100 MCG PO TABS
100.0000 ug | ORAL_TABLET | Freq: Every day | ORAL | Status: DC
  Administered 2014-05-10 – 2014-05-15 (×6): 100 ug via ORAL
  Filled 2014-05-09 (×7): qty 1

## 2014-05-09 MED ORDER — ENALAPRIL MALEATE 20 MG PO TABS
20.0000 mg | ORAL_TABLET | Freq: Two times a day (BID) | ORAL | Status: DC
  Administered 2014-05-10 – 2014-05-15 (×9): 20 mg via ORAL
  Filled 2014-05-09 (×13): qty 1

## 2014-05-09 MED ORDER — EPHEDRINE SULFATE 50 MG/ML IJ SOLN
INTRAMUSCULAR | Status: DC | PRN
  Administered 2014-05-09: 5 mg via INTRAVENOUS

## 2014-05-09 MED ORDER — ASPIRIN EC 81 MG PO TBEC
81.0000 mg | DELAYED_RELEASE_TABLET | Freq: Every day | ORAL | Status: DC
  Administered 2014-05-10 – 2014-05-15 (×5): 81 mg via ORAL
  Filled 2014-05-09 (×7): qty 1

## 2014-05-09 MED ORDER — ALUM & MAG HYDROXIDE-SIMETH 200-200-20 MG/5ML PO SUSP
30.0000 mL | ORAL | Status: DC | PRN
  Administered 2014-05-11 – 2014-05-12 (×3): 30 mL via ORAL
  Filled 2014-05-09 (×3): qty 30

## 2014-05-09 MED ORDER — NITROGLYCERIN 0.4 MG SL SUBL: 0.4000 mg | SUBLINGUAL_TABLET | SUBLINGUAL | Status: DC | PRN

## 2014-05-09 MED ORDER — PROPOFOL 10 MG/ML IV BOLUS
INTRAVENOUS | Status: AC
  Filled 2014-05-09: qty 20

## 2014-05-09 MED ORDER — POTASSIUM CHLORIDE CRYS ER 20 MEQ PO TBCR
20.0000 meq | EXTENDED_RELEASE_TABLET | Freq: Every day | ORAL | Status: DC
  Administered 2014-05-10 – 2014-05-15 (×5): 20 meq via ORAL
  Filled 2014-05-09 (×6): qty 1

## 2014-05-09 MED ORDER — ACETAMINOPHEN 325 MG PO TABS
650.0000 mg | ORAL_TABLET | Freq: Four times a day (QID) | ORAL | Status: DC | PRN
  Administered 2014-05-10 – 2014-05-15 (×6): 650 mg via ORAL
  Filled 2014-05-09 (×8): qty 2

## 2014-05-09 MED ORDER — RIVAROXABAN 10 MG PO TABS: 10.0000 mg | ORAL_TABLET | Freq: Every day | ORAL | Status: DC

## 2014-05-09 MED ORDER — MENTHOL 3 MG MT LOZG: 1.0000 | LOZENGE | OROMUCOSAL | Status: DC | PRN

## 2014-05-09 MED ORDER — PROPOFOL 10 MG/ML IV BOLUS
INTRAVENOUS | Status: DC | PRN
  Administered 2014-05-09: 120 mg via INTRAVENOUS

## 2014-05-09 MED ORDER — NEOSTIGMINE METHYLSULFATE 10 MG/10ML IV SOLN
INTRAVENOUS | Status: DC | PRN
  Administered 2014-05-09: 3 mg via INTRAVENOUS

## 2014-05-09 MED ORDER — FENTANYL CITRATE 0.05 MG/ML IJ SOLN
INTRAMUSCULAR | Status: DC | PRN
  Administered 2014-05-09: 50 ug via INTRAVENOUS
  Administered 2014-05-09: 100 ug via INTRAVENOUS
  Administered 2014-05-09 (×2): 50 ug via INTRAVENOUS

## 2014-05-09 MED ORDER — PHENYLEPHRINE HCL 10 MG/ML IJ SOLN
INTRAMUSCULAR | Status: DC | PRN
  Administered 2014-05-09: 40 ug via INTRAVENOUS
  Administered 2014-05-09: 80 ug via INTRAVENOUS
  Administered 2014-05-09: 40 ug via INTRAVENOUS
  Administered 2014-05-09: 80 ug via INTRAVENOUS

## 2014-05-09 SURGICAL SUPPLY — 56 items
BENZOIN TINCTURE PRP APPL 2/3 (GAUZE/BANDAGES/DRESSINGS) ×2 IMPLANT
BLADE SAW SAG 73X25 THK (BLADE) ×1
BLADE SAW SGTL 73X25 THK (BLADE) ×1 IMPLANT
BRUSH FEMORAL CANAL (MISCELLANEOUS) IMPLANT
CAPT HIP PF MOP ×2 IMPLANT
CLSR STERI-STRIP ANTIMIC 1/2X4 (GAUZE/BANDAGES/DRESSINGS) ×2 IMPLANT
COVER SURGICAL LIGHT HANDLE (MISCELLANEOUS) ×2 IMPLANT
DRAPE INCISE IOBAN 66X45 STRL (DRAPES) ×2 IMPLANT
DRAPE ORTHO SPLIT 77X108 STRL (DRAPES) ×2
DRAPE PROXIMA HALF (DRAPES) ×2 IMPLANT
DRAPE SURG ORHT 6 SPLT 77X108 (DRAPES) ×2 IMPLANT
DRAPE U-SHAPE 47X51 STRL (DRAPES) ×2 IMPLANT
DRILL BIT 5/64 (BIT) ×2 IMPLANT
DRSG MEPILEX BORDER 4X12 (GAUZE/BANDAGES/DRESSINGS) IMPLANT
DRSG MEPILEX BORDER 4X8 (GAUZE/BANDAGES/DRESSINGS) ×2 IMPLANT
DRSG PAD ABDOMINAL 8X10 ST (GAUZE/BANDAGES/DRESSINGS) IMPLANT
DURAPREP 26ML APPLICATOR (WOUND CARE) ×4 IMPLANT
ELECT CAUTERY BLADE 6.4 (BLADE) ×2 IMPLANT
ELECT REM PT RETURN 9FT ADLT (ELECTROSURGICAL) ×2
ELECTRODE REM PT RTRN 9FT ADLT (ELECTROSURGICAL) ×1 IMPLANT
GAUZE SPONGE 4X4 12PLY STRL (GAUZE/BANDAGES/DRESSINGS) ×2 IMPLANT
GLOVE BIOGEL PI ORTHO PRO SZ8 (GLOVE) ×3
GLOVE ORTHO TXT STRL SZ7.5 (GLOVE) ×2 IMPLANT
GLOVE PI ORTHO PRO STRL SZ8 (GLOVE) ×3 IMPLANT
GLOVE SURG ORTHO 8.0 STRL STRW (GLOVE) ×6 IMPLANT
GOWN STRL REUS W/ TWL LRG LVL3 (GOWN DISPOSABLE) ×5 IMPLANT
GOWN STRL REUS W/TWL LRG LVL3 (GOWN DISPOSABLE) ×5
HANDPIECE INTERPULSE COAX TIP (DISPOSABLE)
HOOD PEEL AWAY FACE SHEILD DIS (HOOD) ×12 IMPLANT
KIT BASIN OR (CUSTOM PROCEDURE TRAY) ×2 IMPLANT
KIT ROOM TURNOVER OR (KITS) ×2 IMPLANT
MANIFOLD NEPTUNE II (INSTRUMENTS) ×2 IMPLANT
NEEDLE HYPO 25GX1X1/2 BEV (NEEDLE) ×2 IMPLANT
NS IRRIG 1000ML POUR BTL (IV SOLUTION) ×2 IMPLANT
PACK TOTAL JOINT (CUSTOM PROCEDURE TRAY) ×2 IMPLANT
PAD ARMBOARD 7.5X6 YLW CONV (MISCELLANEOUS) ×4 IMPLANT
PILLOW ABDUCTION HIP (SOFTGOODS) ×2 IMPLANT
PRESSURIZER FEMORAL UNIV (MISCELLANEOUS) IMPLANT
RETRIEVER SUT HEWSON (MISCELLANEOUS) ×2 IMPLANT
SET HNDPC FAN SPRY TIP SCT (DISPOSABLE) IMPLANT
SPONGE LAP 4X18 X RAY DECT (DISPOSABLE) IMPLANT
SUCTION FRAZIER TIP 10 FR DISP (SUCTIONS) ×2 IMPLANT
SUT FIBERWIRE #2 38 REV NDL BL (SUTURE) ×6
SUT MNCRL AB 4-0 PS2 18 (SUTURE) ×2 IMPLANT
SUT VIC AB 0 CT1 27 (SUTURE) ×1
SUT VIC AB 0 CT1 27XBRD ANBCTR (SUTURE) ×1 IMPLANT
SUT VIC AB 2-0 CT1 27 (SUTURE) ×1
SUT VIC AB 2-0 CT1 TAPERPNT 27 (SUTURE) ×1 IMPLANT
SUT VIC AB 3-0 SH 8-18 (SUTURE) ×2 IMPLANT
SUTURE FIBERWR#2 38 REV NDL BL (SUTURE) ×3 IMPLANT
SYR CONTROL 10ML LL (SYRINGE) ×2 IMPLANT
TOWEL OR 17X24 6PK STRL BLUE (TOWEL DISPOSABLE) ×2 IMPLANT
TOWEL OR 17X26 10 PK STRL BLUE (TOWEL DISPOSABLE) ×2 IMPLANT
TOWER CARTRIDGE SMART MIX (DISPOSABLE) IMPLANT
TRAY FOLEY CATH 14FR (SET/KITS/TRAYS/PACK) IMPLANT
WATER STERILE IRR 1000ML POUR (IV SOLUTION) ×4 IMPLANT

## 2014-05-09 NOTE — Op Note (Signed)
05/09/2014  12:41 PM  PATIENT:  [name] [name]   MRN: [id number]  PRE-OPERATIVE DIAGNOSIS:  djd left hip  POST-OPERATIVE DIAGNOSIS:  Degenerated joint disease left hip  PROCEDURE:  Procedure(s): LEFT TOTAL HIP ARTHROPLASTY  PREOPERATIVE INDICATIONS:    [name] [name] is an 80 y.o. female who has a diagnosis of Osteoarthritis of left hip and elected for surgical management after failing conservative treatment.  The risks benefits and alternatives were discussed with the patient including but not limited to the risks of nonoperative treatment, versus surgical intervention including infection, bleeding, nerve injury, periprosthetic fracture, the need for revision surgery, dislocation, leg length discrepancy, blood clots, cardiopulmonary complications, morbidity, mortality, among others, and they were willing to proceed.     OPERATIVE REPORT     SURGEON:  Marchia Bond, MD    ASSISTANT:  Joya Gaskins, OPA-C  (Present throughout the entire procedure,  necessary for completion of procedure in a timely manner, assisting with retraction, instrumentation, and closure)     ANESTHESIA:  General    COMPLICATIONS:  None.     COMPONENTS:  [employer] fit femur size 6 with a 32 mm +1.5 head ball and a gription acetabular shell size 48 with a 10 degree lipped polyethylene liner    PROCEDURE IN DETAIL:   The patient was met in the holding area and  identified.  The appropriate hip was identified and marked at the operative site.  The patient was then transported to the OR  and  placed under general anesthesia.  At that point, the patient was  placed in the lateral decubitus position with the operative side up and  secured to the operating room table and all bony prominences padded.     The operative lower extremity was prepped from the iliac crest to the distal leg.  Sterile draping was performed.  Time out was performed prior to incision.      A routine posterolateral approach was utilized  via sharp dissection  carried down to the subcutaneous tissue.  Gross bleeders were Bovie coagulated.  The iliotibial band was identified and incised along the length of the skin incision.  Self-retaining retractors were  inserted.  With the hip internally rotated, the short external rotators  were identified. The piriformis and capsule was tagged with FiberWire, and the hip capsule released in a T-type fashion.  The femoral neck was exposed, and I resected the femoral neck using the appropriate jig. This was performed at approximately a thumb's breadth above the lesser trochanter.    I then exposed the deep acetabulum, cleared out any tissue including the ligamentum teres.  Access to the cup was fairly challenging, she was extremely tight, and I went back and performed a second neck cut. I also had to remove the calcified acetabular labrum circumferentially using a rongeur, in order to gain access with the reamer.   A wing retractor was placed.  After adequate visualization, I sequentially reamed.  I placed the trial acetabulum, which seated nicely, and then impacted the real cup into place.  Appropriate version and inclination was confirmed clinically matching their bony anatomy, and also with the use of the jig.  A trial polyethylene liner was placed and the wing retractor removed.    I then prepared the proximal femur using the cookie-cutter, the lateralizing reamer, and then sequentially reamed and broached.  A trial broach, neck, and head was utilized, and I reduced the hip and it was found to have excellent stability with  functional range of motion. Although the hip was significantly tight, this is comparable to the contralateral side. The trial components were then removed, and the real polyethylene liner was placed with the lip directed posteriorly.  I then impacted the real femoral prosthesis into place into the appropriate version, slightly anteverted to the normal anatomy, and I impacted  the real head ball into place. The hip was then reduced and taken through functional range of motion and found to have excellent stability. Leg lengths were restored.  I then used a 2 mm drill bits to pass the FiberWire suture from the capsule and piriformis through the greater trochanter, and secured this. Excellent posterior capsular repair was achieved. I also closed the T in the capsule.  I then irrigated the hip copiously again, and repaired the fascia with Vicryl, followed by Vicryl for the subcutaneous tissue, Monocryl for the skin, Steri-Strips and sterile gauze. The wounds were injected. The patient was then awakened and returned to PACU in stable and satisfactory condition. There were no complications.  Marchia Bond, MD Orthopedic Surgeon [phone]   05/09/2014 12:41 PM

## 2014-05-09 NOTE — Transfer of Care (Signed)
Immediate Anesthesia Transfer of Care Note  Patient: [name] [name]  Procedure(s) Performed: Procedure(s): LEFT TOTAL HIP ARTHROPLASTY (Left)  Patient Location: PACU  Anesthesia Type:General  Level of Consciousness: sedated, patient cooperative and responds to stimulation  Airway & Oxygen Therapy: Patient Spontanous Breathing and Patient connected to face mask oxygen  Post-op Assessment: Report given to PACU RN, Post -op Vital signs reviewed and stable and Patient moving all extremities X 4  Post vital signs: Reviewed and stable  Complications: No apparent anesthesia complications

## 2014-05-09 NOTE — Anesthesia Postprocedure Evaluation (Signed)
Anesthesia Post Note  Patient: [name] [name]  Procedure(s) Performed: Procedure(s) (LRB): LEFT TOTAL HIP ARTHROPLASTY (Left)  Anesthesia type: general  Patient location: PACU  Post pain: Pain level controlled  Post assessment: Patient's Cardiovascular Status Stable  Last Vitals:  Filed Vitals:   05/09/14 1415  BP: 101/45  Pulse: 52  Temp: 36.3 C  Resp: 9    Post vital signs: Reviewed and stable  Level of consciousness: sedated  Complications: No apparent anesthesia complications

## 2014-05-09 NOTE — Anesthesia Preprocedure Evaluation (Signed)
Anesthesia Evaluation  Patient identified by MRN, date of birth, ID band Patient awake    Reviewed: Allergy & Precautions, H&P , NPO status , Patient's Chart, lab work & pertinent test results  Airway Mallampati: I TM Distance: >3 FB Neck ROM: Full    Dental   Pulmonary          Cardiovascular hypertension, Pt. on medications + CAD and + Past MI     Neuro/Psych    GI/Hepatic GERD-  Medicated and Controlled,  Endo/Other  diabetes  Renal/GU      Musculoskeletal   Abdominal   Peds  Hematology   Anesthesia Other Findings   Reproductive/Obstetrics                           Anesthesia Physical Anesthesia Plan  ASA: III  Anesthesia Plan: General   Post-op Pain Management:    Induction: Intravenous  Airway Management Planned: Oral ETT  Additional Equipment:   Intra-op Plan:   Post-operative Plan: Extubation in OR  Informed Consent: I have reviewed the patients History and Physical, chart, labs and discussed the procedure including the risks, benefits and alternatives for the proposed anesthesia with the patient or authorized representative who has indicated his/her understanding and acceptance.     Plan Discussed with: CRNA and Surgeon  Anesthesia Plan Comments:         Anesthesia Quick Evaluation

## 2014-05-09 NOTE — H&P (Signed)
PREOPERATIVE H&P  Chief Complaint: djd left hip  HPI: [name] [name] is a 80 y.o. female who presents for preoperative history and physical with a diagnosis of djd left hip. Symptoms are rated as moderate to severe, and have been worsening.  This is significantly impairing activities of daily living.  She has elected for surgical management. She's had a previous right total hip replacement, which is at the left hip replaced as well. She's had injections, can't tolerate anti-inflammatories due to cardiac disease, and use assistive devices without relief. X-rays demonstrate end-stage degenerative change.  Past Medical History  Diagnosis Date  . CAD (coronary artery disease)     acute MI in 3/08 - taxus stent  . Hypercholesteremia     takes Crestor daily  . GERD (gastroesophageal reflux disease)   . Carotid stenosis     mild  . HTN (hypertension)     takes Amlodpine and Carvedilol daily  . Dry cough   . Bronchitis     in Dec  . Ankle edema     pt lasix and hctz daily  . Headache(784.0)     occasionally  . Arthritis     right hip  . Myocardial infarction     pt unsure but thinks about 3260yrs ago;is taking Plavix daily   . Constipation     takes OTC stool softener as needed  . Nocturia   . Hypothyroidism     takes Synthroid daily  . Diabetes mellitus     pt doesn't take any meds for it  . Cataract     left;not ready to come off  . Primary osteoarthritis of right hip 10/27/2011  . DJD (degenerative joint disease) of hip     right  . Hx of cardiovascular stress test     Lexiscan Myoview (04/2014):  No ischemia, EF 64%, Low Risk   Past Surgical History  Procedure Laterality Date  . Back surgery  15+yrs ago  . Lymph node dissection  20+yrs ago    right   . Abdominal hysterectomy  20+yrs  . Coronary artery bypass graft    . Cardiac catheterization  2008  . Total hip arthroplasty  10/27/2011    Procedure: TOTAL HIP ARTHROPLASTY;  Surgeon: Eulas PostJoshua P Conroy Goracke, MD;  Location: [facility] OR;   Service: Orthopedics;  Laterality: Right;   History   Social History  . Marital Status: Widowed    Spouse Name: N/A    Number of Children: N/A  . Years of Education: N/A   Social History Main Topics  . Smoking status: Never Smoker   . Smokeless tobacco: None  . Alcohol Use: No  . Drug Use: No  . Sexual Activity: No   Other Topics Concern  . None   Social History Narrative  . None   Family History  Problem Relation Age of Onset  . Anesthesia problems Neg Hx   . Hypotension Neg Hx   . Malignant hyperthermia Neg Hx   . Pseudochol deficiency Neg Hx   . Heart attack Brother   . Heart attack Sister   . Hypertension Mother   . Hypertension Father   . Hypertension Sister   . Hypertension Brother   . Hypertension Daughter   . Hypertension Son   . Stroke Mother   . Hyperlipidemia Sister   . Diabetes Brother    No Known Allergies Prior to Admission medications   Medication Sig Start Date End Date Taking? Authorizing Provider  acetaminophen (TYLENOL) 500 MG tablet Take  500 mg by mouth every 4 (four) hours as needed for mild pain.   Yes Historical Provider, MD  aspirin EC 81 MG tablet Take 81 mg by mouth daily.   Yes Historical Provider, MD  clopidogrel (PLAVIX) 75 MG tablet Take 75 mg by mouth daily.   Yes Historical Provider, MD  enalapril (VASOTEC) 20 MG tablet Take 1 tablet (20 mg total) by mouth 2 (two) times daily. 01/18/13  Yes Laurey Morale, MD  furosemide (LASIX) 40 MG tablet Take 20-40 mg by mouth 2 (two) times daily. Take 1 tablet in the morning Take 0.5 tablet every evening   Yes Historical Provider, MD  hydrochlorothiazide (HYDRODIURIL) 25 MG tablet Take 25 mg by mouth daily.  08/15/13  Yes Laurey Morale, MD  levothyroxine (SYNTHROID, LEVOTHROID) 100 MCG tablet Take 100 mcg by mouth daily.     Yes Historical Provider, MD  potassium chloride SA (K-DUR,KLOR-CON) 20 MEQ tablet Take 20 mEq by mouth daily.   Yes Historical Provider, MD  rosuvastatin (CRESTOR) 40 MG  tablet Take 1 tablet (40 mg total) by mouth daily. 10/19/12  Yes Laurey Morale, MD  nitroGLYCERIN (NITROSTAT) 0.4 MG SL tablet Place 0.4 mg under the tongue every 5 (five) minutes as needed for chest pain.    Historical Provider, MD     Positive ROS: All other systems have been reviewed and were otherwise negative with the exception of those mentioned in the HPI and as above.  Physical Exam: General: Alert, no acute distress Cardiovascular: No pedal edema Respiratory: No cyanosis, no use of accessory musculature GI: No organomegaly, abdomen is soft and non-tender Skin: No lesions in the area of chief complaint Neurologic: Sensation intact distally Psychiatric: Patient is competent for consent with normal mood and affect Lymphatic: No axillary or cervical lymphadenopathy  MUSCULOSKELETAL: Left hip EHL and FHL are firing. Very minimal internal rotation. Hip flexion to 90 at most with significant crepitance and pain.  Assessment: djd left hip  Plan: Plan for Procedure(s): LEFT TOTAL HIP ARTHROPLASTY  The risks benefits and alternatives were discussed with the patient including but not limited to the risks of nonoperative treatment, versus surgical intervention including infection, bleeding, nerve injury, periprosthetic fracture, the need for revision surgery, dislocation, leg length discrepancy, blood clots, cardiopulmonary complications, morbidity, mortality, among others, and they were willing to proceed.     Eulas Post, MD Cell [phone]   05/09/2014 10:02 AM

## 2014-05-10 DIAGNOSIS — M1610 Unilateral primary osteoarthritis, unspecified hip: Secondary | ICD-10-CM | POA: Diagnosis not present

## 2014-05-10 DIAGNOSIS — M169 Osteoarthritis of hip, unspecified: Secondary | ICD-10-CM | POA: Diagnosis not present

## 2014-05-10 LAB — CBC
HEMATOCRIT: 29 % — AB (ref 36.0–46.0)
Hemoglobin: 9.4 g/dL — ABNORMAL LOW (ref 12.0–15.0)
MCH: 31.4 pg (ref 26.0–34.0)
MCHC: 32.4 g/dL (ref 30.0–36.0)
MCV: 97 fL (ref 78.0–100.0)
Platelets: 196 10*3/uL (ref 150–400)
RBC: 2.99 MIL/uL — AB (ref 3.87–5.11)
RDW: 13.8 % (ref 11.5–15.5)
WBC: 8.9 10*3/uL (ref 4.0–10.5)

## 2014-05-10 LAB — BASIC METABOLIC PANEL
Anion gap: 13 (ref 5–15)
BUN: 11 mg/dL (ref 6–23)
CO2: 23 mEq/L (ref 19–32)
Calcium: 8 mg/dL — ABNORMAL LOW (ref 8.4–10.5)
Chloride: 104 mEq/L (ref 96–112)
Creatinine, Ser: 0.71 mg/dL (ref 0.50–1.10)
GFR calc Af Amer: >90 mL/min (ref >90)
GFR, EST NON AFRICAN AMERICAN: 80 mL/min — AB (ref >90)
Glucose, Bld: 156 mg/dL — ABNORMAL HIGH (ref 70–99)
POTASSIUM: 4.1 meq/L (ref 3.7–5.3)
SODIUM: 140 meq/L (ref 137–147)

## 2014-05-10 LAB — GLUCOSE, CAPILLARY
Glucose-Capillary: 146 mg/dL — ABNORMAL HIGH (ref 70–99)
Glucose-Capillary: 178 mg/dL — ABNORMAL HIGH (ref 70–99)

## 2014-05-10 NOTE — Progress Notes (Signed)
     Subjective:  Patient reports pain as mild.  No Complaints.  Objective:   VITALS:   Filed Vitals:   05/10/14 0225 05/10/14 0400 05/10/14 0511 05/10/14 0759  BP: 152/88  129/45   Pulse: 64  76   Temp: 97.5 F (36.4 C)  98.9 F (37.2 C)   TempSrc: Oral  Oral   Resp: 16 16 16 16   Height:      Weight:      SpO2: 98% 97% 97% 97%    Neurologically intact Intact pulses distally Dorsiflexion/Plantar flexion intact   Lab Results  Component Value Date   WBC 8.9 05/10/2014   HGB 9.4* 05/10/2014   HCT 29.0* 05/10/2014   MCV 97.0 05/10/2014   PLT 196 05/10/2014   BMET    Component Value Date/Time   NA 140 05/10/2014 0635   K 4.1 05/10/2014 0635   CL 104 05/10/2014 0635   CO2 23 05/10/2014 0635   GLUCOSE 156* 05/10/2014 0635   BUN 11 05/10/2014 0635   CREATININE 0.71 05/10/2014 0635   CALCIUM 8.0* 05/10/2014 0635   GFRNONAA 80* 05/10/2014 0635   GFRAA >90 05/10/2014 [id number]     Assessment/Plan: 1 Day Post-Op   Principal Problem:   Osteoarthritis of left hip Active Problems:   Hip arthritis   Advance diet Up with therapy Discharge home with home health Possible dc home thurs vs. Fri, family doesn't want her to go to SNF.  Will depend on PT.     Nandita Mathenia P 05/10/2014, 8:33 AM   Teryl LucyJoshua Izyk Marty, MD Cell [phone]

## 2014-05-10 NOTE — Progress Notes (Signed)
Utilization review completed.  

## 2014-05-10 NOTE — Progress Notes (Signed)
Physical Therapy Treatment Patient Details Name: [name] [name] MRN: [id number] DOB: 02/10/1934 Today's Date: 05/10/2014    History of Present Illness L posterior approach THA    PT Comments    *Pt declined ambulation 2* fatigue. Agreed to THA bed exercises. Reviewed posterior precautions, pt able to recall 1/3 precautions. **  Follow Up Recommendations  SNF     Equipment Recommendations  None recommended by PT    Recommendations for Other Services OT consult     Precautions / Restrictions Precautions Precautions: Posterior Hip Precaution Booklet Issued: Yes (comment) Precaution Comments: sign hung in room; pt recalled 1/3 precautions; reviewed precautions in detail Restrictions Other Position/Activity Restrictions: WBAT LLE    Mobility                                      Stairs            Wheelchair Mobility    Modified Rankin (Stroke Patients Only)       Balance                                Cognition Arousal/Alertness: Awake/alert Behavior During Therapy: WFL for tasks assessed/performed Overall Cognitive Status: Within Functional Limits for tasks assessed                      Exercises Total Joint Exercises Ankle Circles/Pumps: AROM;Both;10 reps;Supine Quad Sets: AROM;Both;5 reps;Supine Short Arc Quad: AROM;Left;10 reps Heel Slides: AAROM;Left;10 reps;Supine Hip ABduction/ADduction: AAROM;Left;10 reps;Supine    General Comments        Pertinent Vitals/Pain **6/10 L hip pain Ice applied, repositioned*    Home Living Family/patient expects to be discharged to:: Skilled nursing facility Living Arrangements: Alone Available Help at Discharge: Skilled Nursing Facility   Home Access: Stairs to enter Entrance Stairs-Rails: Right;Left;Can reach both Home Layout: One level Home Equipment: Environmental consultantWalker - 2 wheels;Cane - single point;Bedside commode Additional Comments: lived alone PTA    Prior  Function Level of Independence: Independent with assistive device(s)      Comments: SPC   PT Goals (current goals can now be found in the care plan section) Acute Rehab PT Goals Patient Stated Goal: to walk PT Goal Formulation: With patient Time For Goal Achievement: 05/24/14 Potential to Achieve Goals: Good Progress towards PT goals: Progressing toward goals    Frequency  7X/week    PT Plan      Co-evaluation             End of Session Equipment Utilized During Treatment: Gait belt Activity Tolerance: Patient limited by pain;Patient limited by fatigue Patient left: with call bell/phone within reach;in bed     Time: [id number] PT Time Calculation (min): 8 min  Charges:  $Gait Training: 8-22 mins $Therapeutic Exercise: 8-22 mins                    G Codes:      Tamala SerUhlenberg, Maraya Gwilliam Kistler 05/10/2014, 1:55 PM [phone]

## 2014-05-10 NOTE — Discharge Instructions (Signed)
Diet: As you were doing prior to hospitalization  ° °Shower:  May shower but keep the wounds dry, use an occlusive plastic wrap, NO SOAKING IN TUB.  If the bandage gets wet, change with a clean dry gauze. ° °Dressing:  You may change your dressing 3-5 days after surgery.  Then change the dressing daily with sterile gauze dressing.   ° °There are sticky tapes (steri-strips) on your wounds and all the stitches are absorbable.  Leave the steri-strips in place when changing your dressings, they will peel off with time, usually 2-3 weeks. ° °Activity:  Increase activity slowly as tolerated, but follow the weight bearing instructions below.  No lifting or driving for 6 weeks. ° °Weight Bearing:   As tolerated.   ° °To prevent constipation: you may use a stool softener such as - ° °Colace (over the counter) 100 mg by mouth twice a day  °Drink plenty of fluids (prune juice may be helpful) and high fiber foods °Miralax (over the counter) for constipation as needed.   ° °Itching:  If you experience itching with your medications, try taking only a single pain pill, or even half a pain pill at a time.  You may take up to 10 pain pills per day, and you can also use benadryl over the counter for itching or also to help with sleep.  ° °Precautions:  If you experience chest pain or shortness of breath - call 911 immediately for transfer to the hospital emergency department!! ° °If you develop a fever greater that 101 F, purulent drainage from wound, increased redness or drainage from wound, or calf pain -- Call the office at [phone]                                                °Follow- Up Appointment:  Please call for an appointment to be seen in 2 weeks Emily - [phone] ° °___________________________________ ° ° °Information on my medicine - XARELTO® (Rivaroxaban) ° °This medication education was reviewed with me or my healthcare representative as part of my discharge preparation.  The pharmacist that spoke with  me during my hospital stay was:  Jovian Lembcke P, RPH ° °Why was Xarelto® prescribed for you? °Xarelto® was prescribed for you to reduce the risk of blood clots forming after orthopedic surgery. The medical term for these abnormal blood clots is venous thromboembolism (VTE). ° °What do you need to know about xarelto® ? °Take your Xarelto® ONCE DAILY at the same time every day. °You may take it either with or without food. ° °If you have difficulty swallowing the tablet whole, you may crush it and mix in applesauce just prior to taking your dose. ° °Take Xarelto® exactly as prescribed by your doctor and DO NOT stop taking Xarelto® without talking to the doctor who prescribed the medication.  Stopping without other VTE prevention medication to take the place of Xarelto® may increase your risk of developing a clot. ° °After discharge, you should have regular check-up appointments with your healthcare provider that is prescribing your Xarelto®.   ° °What do you do if you miss a dose? °If you miss a dose, take it as soon as you remember on the same day then continue your regularly scheduled once daily regimen the next day. Do not take two doses of Xarelto® on the same day.  ° °  Important Safety Information °A possible side effect of Xarelto® is bleeding. You should call your healthcare provider right away if you experience any of the following: °  Bleeding from an injury or your nose that does not stop. °  Unusual colored urine (red or dark brown) or unusual colored stools (red or black). °  Unusual bruising for unknown reasons. °  A serious fall or if you hit your head (even if there is no bleeding). ° °Some medicines may interact with Xarelto® and might increase your risk of bleeding while on Xarelto®. To help avoid this, consult your healthcare provider or pharmacist prior to using any new prescription or non-prescription medications, including herbals, vitamins, non-steroidal anti-inflammatory drugs (NSAIDs) and  supplements. ° °This website has more information on Xarelto®: www.xarelto.com. ° ° °

## 2014-05-10 NOTE — Evaluation (Signed)
Occupational Therapy Evaluation Patient Details Name: [name] [name] MRN: [id number] DOB: 02-10-1934 Today's Date: 05/10/2014    History of Present Illness 80 yo female s/p posterior LT THA   Clinical Impression   Patient is s/p Lt THA posterior surgery resulting in functional limitations due to the deficits listed below (see OT problem list).  Patient will benefit from skilled OT acutely to increase independence and safety with ADLS to allow discharge SNF. OT to follow acutely for adl retraining and basic transfers.      Follow Up Recommendations  SNF    Equipment Recommendations  None recommended by OT    Recommendations for Other Services       Precautions / Restrictions Precautions Precautions: Posterior Hip Precaution Booklet Issued: Yes (comment) Precaution Comments: reviewed precautions- pt had to look at sign to use teach back Restrictions Other Position/Activity Restrictions: WBAT LLE      Mobility Bed Mobility Overal bed mobility: Needs Assistance Bed Mobility: Supine to Sit     Supine to sit: Max assist;HOB elevated (and pad used)     General bed mobility comments: pt required extenisve (A) and bed pad used to progress to EOB  Transfers Overall transfer level: Needs assistance Equipment used: Rolling walker (2 wheeled) Transfers: Sit to/from Stand Sit to Stand: Mod assist              Balance                                            ADL Overall ADL's : Needs assistance/impaired     Grooming: Wash/dry face;Minimal assistance;Standing       Lower Body Bathing: Total assistance;Sit to/from stand           Toilet Transfer: Moderate assistance;Ambulation;RW;BSC   Toileting- Clothing Manipulation and Hygiene: Moderate assistance;Sit to/from stand       Functional mobility during ADLs: Moderate assistance;Rolling walker General ADL Comments: Pt supine on arrival and needs extensive help with exiting the bed. Pt  requires hand out for recall of precautions. PT needed v/c for safety      Vision                     Perception     Praxis      Pertinent Vitals/Pain 8 out 10      Hand Dominance Right   Extremity/Trunk Assessment Upper Extremity Assessment Upper Extremity Assessment: Overall WFL for tasks assessed   Lower Extremity Assessment Lower Extremity Assessment: Defer to PT evaluation   Cervical / Trunk Assessment Cervical / Trunk Assessment: Normal   Communication Communication Communication: No difficulties   Cognition Arousal/Alertness: Awake/alert Behavior During Therapy: WFL for tasks assessed/performed Overall Cognitive Status: Within Functional Limits for tasks assessed                     General Comments       Exercises       Shoulder Instructions      Home Living Family/patient expects to be discharged to:: Skilled nursing facility Living Arrangements: Alone Available Help at Discharge: Skilled Nursing Facility   Home Access: Stairs to enter Entrance Stairs-Number of Steps: 4 Entrance Stairs-Rails: Right;Left;Can reach both Home Layout: One level               Home Equipment: Walker - 2 wheels;Cane - single point;Bedside commode  Additional Comments: lived alone PTA      Prior Functioning/Environment Level of Independence: Independent with assistive device(s)        Comments: SPC    OT Diagnosis: Generalized weakness;Acute pain   OT Problem List: Decreased strength;Decreased activity tolerance;Impaired balance (sitting and/or standing);Decreased safety awareness;Decreased knowledge of use of DME or AE;Decreased knowledge of precautions;Pain   OT Treatment/Interventions: Self-care/ADL training;Therapeutic exercise;DME and/or AE instruction;Therapeutic activities;Patient/family education;Balance training    OT Goals(Current goals can be found in the care plan section) Acute Rehab OT Goals Patient Stated Goal: to walk OT  Goal Formulation: With patient Time For Goal Achievement: 05/24/14 Potential to Achieve Goals: Good  OT Frequency: Min 2X/week   Barriers to D/C:            Co-evaluation              End of Session Nurse Communication: Mobility status;Precautions  Activity Tolerance: Patient tolerated treatment well Patient left: in bed;with call bell/phone within reach   Time: [id number] OT Time Calculation (min): 19 min Charges:  OT General Charges $OT Visit: 1 Procedure OT Evaluation $Initial OT Evaluation Tier I: 1 Procedure OT Treatments $Self Care/Home Management : 8-22 mins G-Codes:    Harolyn RutherfordJones, Talor Desrosiers B 05/10/2014, 4:10 PM Pager: [phone]

## 2014-05-10 NOTE — Evaluation (Signed)
Physical Therapy Evaluation Patient Details Name: [name] [name] MRN: [id number] DOB: 09/09/1934 Today's Date: 05/10/2014   History of Present Illness  L posterior approach THA  Clinical Impression  **Pt is s/p THA resulting in the deficits listed below (see PT Problem List). ** Pt will benefit from skilled PT to increase their independence and safety with mobility to allow discharge to the venue listed below.   Mod assist for bed to recliner transfer, pain limiting activity tolerance. Pt lives alone, SNF recommended. Good progress expected.    *    Follow Up Recommendations SNF    Equipment Recommendations  None recommended by PT    Recommendations for Other Services OT consult     Precautions / Restrictions Precautions Precautions: Posterior Hip Precaution Booklet Issued: Yes (comment) Precaution Comments: sign hung in room Restrictions Weight Bearing Restrictions: No Other Position/Activity Restrictions: WBAT LLE      Mobility  Bed Mobility Overal bed mobility: Needs Assistance Bed Mobility: Supine to Sit     Supine to sit: Mod assist     General bed mobility comments: assist for LLE and to raise trunk, used rail  Transfers Overall transfer level: Needs assistance Equipment used: Rolling walker (2 wheeled) Transfers: Sit to/from Stand Sit to Stand: Mod assist         General transfer comment: assist to rise and steady  Ambulation/Gait Ambulation/Gait assistance: Min assist Ambulation Distance (Feet): 5 Feet Assistive device: Rolling walker (2 wheeled) Gait Pattern/deviations: Decreased step length - left;Decreased step length - right   Gait velocity interpretation: Below normal speed for age/gender General Gait Details: increased time, VCs for sequencing, tolerance limited by pain L hip  Stairs            Wheelchair Mobility    Modified Rankin (Stroke Patients Only)       Balance Overall balance assessment: Needs assistance   Sitting  balance-Leahy Scale: Good     Standing balance support: Bilateral upper extremity supported Standing balance-Leahy Scale: Poor                               Pertinent Vitals/Pain *7/10 L hip with activity Premedicated Ice applied**    Home Living Family/patient expects to be discharged to:: Skilled nursing facility Living Arrangements: Alone Available Help at Discharge: Skilled Nursing Facility   Home Access: Stairs to enter Entrance Stairs-Rails: Right;Left;Can reach both Entrance Stairs-Number of Steps: 4 Home Layout: One level Home Equipment: Walker - 2 wheels;Cane - single point;Bedside commode Additional Comments: lived alone PTA    Prior Function Level of Independence: Independent with assistive device(s)         Comments: SPC     Hand Dominance        Extremity/Trunk Assessment   Upper Extremity Assessment: Overall WFL for tasks assessed           Lower Extremity Assessment: LLE deficits/detail   LLE Deficits / Details: L hip ABDuction and flexion AAROM limited 50% by pain, ankle WNL, knee AAROM WFL  Cervical / Trunk Assessment: Normal  Communication   Communication: No difficulties  Cognition Arousal/Alertness: Awake/alert Behavior During Therapy: WFL for tasks assessed/performed Overall Cognitive Status: Within Functional Limits for tasks assessed                      General Comments      Exercises Total Joint Exercises Ankle Circles/Pumps: AROM;Both;10 reps;Supine Quad Sets: AROM;Both;5 reps;Supine Heel  Slides: AAROM;Left;10 reps;Supine Hip ABduction/ADduction: AAROM;Left;10 reps;Supine      Assessment/Plan    PT Assessment Patient needs continued PT services  PT Diagnosis Difficulty walking;Acute pain   PT Problem List Decreased strength;Decreased activity tolerance;Decreased range of motion;Decreased balance;Pain;Decreased knowledge of use of DME;Decreased mobility;Decreased knowledge of precautions  PT  Treatment Interventions Gait training;DME instruction;Therapeutic exercise;Functional mobility training;Therapeutic activities;Patient/family education   PT Goals (Current goals can be found in the Care Plan section) Acute Rehab PT Goals Patient Stated Goal: to walk PT Goal Formulation: With patient Time For Goal Achievement: 05/24/14 Potential to Achieve Goals: Good    Frequency 7X/week   Barriers to discharge        Co-evaluation               End of Session Equipment Utilized During Treatment: Gait belt Activity Tolerance: Patient limited by pain Patient left: in chair;with call bell/phone within reach Nurse Communication: Mobility status         Time: 1020-1052 PT Time Calculation (min): 32 min   Charges:   PT Evaluation $Initial PT Evaluation Tier I: 1 Procedure PT Treatments $Gait Training: 8-22 mins $Therapeutic Exercise: 8-22 mins   PT G Codes:          Tamala Ser 05/10/2014, 11:01 AM [phone]

## 2014-05-11 ENCOUNTER — Encounter (HOSPITAL_COMMUNITY): Payer: Self-pay | Admitting: Orthopedic Surgery

## 2014-05-11 LAB — BASIC METABOLIC PANEL
Anion gap: 11 (ref 5–15)
BUN: 17 mg/dL (ref 6–23)
CALCIUM: 8.6 mg/dL (ref 8.4–10.5)
CO2: 27 meq/L (ref 19–32)
CREATININE: 1.02 mg/dL (ref 0.50–1.10)
Chloride: 99 mEq/L (ref 96–112)
GFR calc Af Amer: 59 mL/min — ABNORMAL LOW (ref >90)
GFR calc non Af Amer: 51 mL/min — ABNORMAL LOW (ref >90)
Glucose, Bld: 112 mg/dL — ABNORMAL HIGH (ref 70–99)
Potassium: 4 mEq/L (ref 3.7–5.3)
Sodium: 137 mEq/L (ref 137–147)

## 2014-05-11 LAB — GLUCOSE, CAPILLARY
GLUCOSE-CAPILLARY: 131 mg/dL — AB (ref 70–99)
GLUCOSE-CAPILLARY: 125 mg/dL — AB (ref 70–99)
Glucose-Capillary: 113 mg/dL — ABNORMAL HIGH (ref 70–99)
Glucose-Capillary: 124 mg/dL — ABNORMAL HIGH (ref 70–99)

## 2014-05-11 LAB — CBC
HCT: 26.2 % — ABNORMAL LOW (ref 36.0–46.0)
Hemoglobin: 8.7 g/dL — ABNORMAL LOW (ref 12.0–15.0)
MCH: 31.9 pg (ref 26.0–34.0)
MCHC: 33.2 g/dL (ref 30.0–36.0)
MCV: 96 fL (ref 78.0–100.0)
PLATELETS: 179 10*3/uL (ref 150–400)
RBC: 2.73 MIL/uL — AB (ref 3.87–5.11)
RDW: 13.7 % (ref 11.5–15.5)
WBC: 13.5 10*3/uL — ABNORMAL HIGH (ref 4.0–10.5)

## 2014-05-11 NOTE — Progress Notes (Signed)
Clinical Social Work Department BRIEF PSYCHOSOCIAL ASSESSMENT 05/11/2014  Patient:  [name] [name],[name] [name]     Account Number:  [id number]     Admit date:  05/09/2014  Clinical Social Worker:  Lourdes SledgeWILSON,Bevely Hackbart, LCSWA  Date/Time:  05/11/2014 02:25 PM  Referred by:  Physician  Date Referred:  05/11/2014 Referred for  SNF Placement   Other Referral:   Interview type:  Patient Other interview type:    PSYCHOSOCIAL DATA Living Status:  ALONE Admitted from facility:   Level of care:   Primary support name:  Dennard NipSara MaCain [phone] Primary support relationship to patient:  CHILD, ADULT Degree of support available:   Pt states she lives alone. Pt did not mention any support systems that are available to her at dc.    CURRENT CONCERNS Current Concerns  Post-Acute Placement   Other Concerns:    SOCIAL WORK ASSESSMENT / PLAN Covering CSW informed that PT is recommending SNF.    CSW visited pt room and introduced herself and role. CSW informed pt of PT recommendations and explored pt thoughts. Pt states she would like to discharge home however she understands that she will need assistance at home. Pt [place].    CSW received consent to do a SNF search. CSW will follow up with bed offers.   Assessment/plan status:  Psychosocial Support/Ongoing Assessment of Needs Other assessment/ plan:   Information/referral to community resources:   CSW provided pt with a SNF list and CSW contact information.    PATIENT'S/FAMILY'S RESPONSE TO PLAN OF CARE: Pt laying on the recliner, alert and oriented. Pt states that [place] is agree[place].    CSW will followup with bed offers.       Theresia BoughNorma Tully Mcinturff, MSW, LCSW [phone]

## 2014-05-11 NOTE — Progress Notes (Addendum)
Physical Therapy Treatment Patient Details Name: [name] [name] MRN: [id number] DOB: 05/31/1934 Today's Date: 05/11/2014    History of Present Illness 80 yo female s/p posterior L THA    PT Comments    *Good progress with mobility today, pt ambulated 1630' with RW and performed THA exercises with assist. Overall improved tolerance of mobility.  **  Follow Up Recommendations  SNF     Equipment Recommendations  None recommended by PT    Recommendations for Other Services OT consult     Precautions / Restrictions Precautions Precautions: Posterior Hip Precaution Booklet Issued: Yes (comment) Precaution Comments: sign hung in room; pt recalled 2/3 precautions; reviewed precautions in detail Restrictions Other Position/Activity Restrictions: WBAT LLE    Mobility  Bed Mobility Overal bed mobility: Needs Assistance Bed Mobility: Supine to Sit     Supine to sit: Min assist;HOB elevated     General bed mobility comments: assist for LLE , used rail, HOB 25*  Transfers Overall transfer level: Needs assistance Equipment used: Rolling walker (2 wheeled) Transfers: Sit to/from Stand Sit to Stand: Min assist         General transfer comment: assist to rise and steady; cues for hand placement  Ambulation/Gait Ambulation/Gait assistance: Min guard Ambulation Distance (Feet): 30 Feet Assistive device: Rolling walker (2 wheeled) Gait Pattern/deviations: Step-to pattern;Decreased stride length     General Gait Details: increased time, VCs for sequencing   Stairs            Wheelchair Mobility    Modified Rankin (Stroke Patients Only)       Balance                                    Cognition Arousal/Alertness: Awake/alert Behavior During Therapy: WFL for tasks assessed/performed Overall Cognitive Status: Within Functional Limits for tasks assessed                      Exercises Total Joint Exercises Ankle Circles/Pumps:  AROM;Both;10 reps;Supine Quad Sets: AROM;Both;Supine;10 reps Short Arc Quad: AROM;Left;10 reps Heel Slides: AAROM;Left;10 reps;Supine Hip ABduction/ADduction: AAROM;Left;10 reps;Supine Long Arc Quad: AAROM;Left;10 reps;Seated    General Comments        Pertinent Vitals/Pain *3/10 L hip Premedicated, ice applied**    Home Living                      Prior Function            PT Goals (current goals can now be found in the care plan section) Acute Rehab PT Goals Patient Stated Goal: to walk PT Goal Formulation: With patient Time For Goal Achievement: 05/24/14 Potential to Achieve Goals: Good Progress towards PT goals: Progressing toward goals    Frequency  7X/week    PT Plan Current plan remains appropriate    Co-evaluation             End of Session Equipment Utilized During Treatment: Gait belt Activity Tolerance: Patient tolerated treatment well;Patient limited by fatigue Patient left: with call bell/phone within reach;in chair     Time: 1020-1043 PT Time Calculation (min): 23 min  Charges:  $Gait Training: 8-22 mins $Therapeutic Exercise: 8-22 mins                    G Codes:      Tamala SerUhlenberg, Arriel Victor Kistler 05/11/2014, 10:53 AM [phone]

## 2014-05-11 NOTE — Progress Notes (Signed)
     Subjective:  Patient reports pain as mild.  Limited walking with PT.  Objective:   VITALS:   Filed Vitals:   05/10/14 2027 05/11/14 0000 05/11/14 0331 05/11/14 0538  BP: 132/54   105/50  Pulse: 78   59  Temp: 97.6 F (36.4 C)   97.6 F (36.4 C)  TempSrc: Oral   Oral  Resp: 16 16 18 16   Height:      Weight:      SpO2: 99% 99%  98%    Neurologically intact Dorsiflexion/Plantar flexion intact Incision: dressing C/D/I and no drainage   Lab Results  Component Value Date   WBC 8.9 05/10/2014   HGB 9.4* 05/10/2014   HCT 29.0* 05/10/2014   MCV 97.0 05/10/2014   PLT 196 05/10/2014   BMET    Component Value Date/Time   NA 140 05/10/2014 0635   K 4.1 05/10/2014 0635   CL 104 05/10/2014 0635   CO2 23 05/10/2014 0635   GLUCOSE 156* 05/10/2014 0635   BUN 11 05/10/2014 0635   CREATININE 0.71 05/10/2014 0635   CALCIUM 8.0* 05/10/2014 0635   GFRNONAA 80* 05/10/2014 0635   GFRAA >90 05/10/2014 0635     Assessment/Plan: 2 Days Post-Op   Principal Problem:   Osteoarthritis of left hip Active Problems:   Hip arthritis   Advance diet Up with therapy May need SNF, possibly tomorrow.   Ayerim Berquist P 05/11/2014, 6:57 AM   Teryl LucyJoshua Stedman [place], MD Cell [phone]

## 2014-05-11 NOTE — Progress Notes (Addendum)
Clinical Social Work Department CLINICAL SOCIAL WORK PLACEMENT NOTE 05/11/2014  Patient:  [name] [name],[name] [name]  Account Number:  [id number] Admit date:  05/09/2014  Clinical Social Worker:  Theresia BoughNORMA WILSON, Theresia MajorsLCSWA  Date/time:  05/11/2014 02:32 PM  Clinical Social Work is seeking post-discharge placement for this patient at the following level of care:   SKILLED NURSING   (*CSW will update this form in Epic as items are completed)   05/11/2014  Patient/family provided with [facility] Department of Clinical Social Work's list of facilities offering this level of care within the geographic area requested by the patient (or if unable, by the patient's family).  05/11/2014  Patient/family informed of their freedom to choose among providers that offer the needed level of care, that participate in Medicare, Medicaid or managed care program needed by the patient, have an available bed and are willing to accept the patient.  05/11/2014  Patient/family informed of [facility]' ownership interest in [facility], as well as of the fact that they are under no obligation to receive care at this facility.  PASARR submitted to EDS on 05/11/2014 PASARR number received on   FL2 transmitted to all facilities in geographic area requested by pt/family on  05/11/2014 FL2 transmitted to all facilities within larger geographic area on   Patient informed that his/her managed care company has contracts with or will negotiate with  certain facilities, including the following:     Patient/family informed of bed offers received:  05/12/2014 Patient chooses bed at  [facility] [place] Physician recommends and patient chooses bed at    Patient [facility] [place] on  05/15/2014   Patient to be transferred to facility by [facility] Patient and family notified of transfer on  05/15/2014 Name of family member notified:  Ellwood HandlerMcCain,[name] [name]  The following  physician request were entered in Epic:   Additional Comments:  05/15/2014  Facility received [employer] Authorization  Theresia BoughNorma Wilson, MSW, LCSW [phone]

## 2014-05-12 ENCOUNTER — Inpatient Hospital Stay (HOSPITAL_COMMUNITY): Payer: Medicare HMO

## 2014-05-12 ENCOUNTER — Encounter (HOSPITAL_COMMUNITY): Payer: Self-pay | Admitting: Orthopedic Surgery

## 2014-05-12 DIAGNOSIS — K9189 Other postprocedural complications and disorders of digestive system: Secondary | ICD-10-CM

## 2014-05-12 DIAGNOSIS — K567 Ileus, unspecified: Secondary | ICD-10-CM

## 2014-05-12 HISTORY — DX: Ileus, unspecified: K56.7

## 2014-05-12 LAB — GLUCOSE, CAPILLARY
GLUCOSE-CAPILLARY: 128 mg/dL — AB (ref 70–99)
GLUCOSE-CAPILLARY: 125 mg/dL — AB (ref 70–99)
Glucose-Capillary: 160 mg/dL — ABNORMAL HIGH (ref 70–99)
Glucose-Capillary: 131 mg/dL — ABNORMAL HIGH (ref 70–99)
Glucose-Capillary: 161 mg/dL — ABNORMAL HIGH (ref 70–99)

## 2014-05-12 LAB — CBC
HCT: 27.8 % — ABNORMAL LOW (ref 36.0–46.0)
HEMOGLOBIN: 9.2 g/dL — AB (ref 12.0–15.0)
MCH: 30.9 pg (ref 26.0–34.0)
MCHC: 33.1 g/dL (ref 30.0–36.0)
MCV: 93.3 fL (ref 78.0–100.0)
Platelets: 252 10*3/uL (ref 150–400)
RBC: 2.98 MIL/uL — ABNORMAL LOW (ref 3.87–5.11)
RDW: 13.6 % (ref 11.5–15.5)
WBC: 13.8 10*3/uL — ABNORMAL HIGH (ref 4.0–10.5)

## 2014-05-12 MED ORDER — PROMETHAZINE HCL 25 MG/ML IJ SOLN
12.5000 mg | Freq: Three times a day (TID) | INTRAMUSCULAR | Status: DC | PRN
  Administered 2014-05-12: 12.5 mg via INTRAVENOUS
  Filled 2014-05-12: qty 1

## 2014-05-12 MED ORDER — FLEET ENEMA 7-19 GM/118ML RE ENEM
1.0000 | ENEMA | Freq: Once | RECTAL | Status: AC
  Administered 2014-05-12: 1 via RECTAL
  Filled 2014-05-12: qty 1

## 2014-05-12 MED ORDER — NALOXONE HCL 0.4 MG/ML IJ SOLN
INTRAMUSCULAR | Status: AC
  Filled 2014-05-12: qty 1

## 2014-05-12 NOTE — Progress Notes (Addendum)
CSW made aware this afternoon that pt does not have [employer] however pt has [employer]. CSW informed pt and son that if pt is ready for dc over the weekend the pt will need to dc to a facility that will accept her with an log. Son would like to see if [facility] [place] [facility] [phone] can accept pt tomorrow. [facility] who will have a decision for CSW in the morning. Pt also has offers at [facility] [phone] or [facility].  [facility] [place] is not agreeable to accept pt with an LOG. Family aware. Son states he may just take the patient home and provide care for her at discharge.  Weekend CSW to follow up with [facility] in the morning pending if pt is medically ready.  Theresia BoughNorma Krystine Pabst, MSW, LCSW [phone]

## 2014-05-12 NOTE — Progress Notes (Signed)
PT Cancellation Note  Patient Details Name: [name] [name] MRN: [id number] DOB: 11/22/1933   Cancelled Treatment:    Reason Eval/Treat Not Completed: Medical issues which prohibited therapy (Patient nauseated).  Will hold PT this am and return this pm to attempt PT session.   Vena AustriaDavis, Robertt Buda H 05/12/2014, 1:54 PM Durenda HurtSusan H. Renaldo Fiddleravis, PT, [facility] Acute Rehab Services Pager [phone]

## 2014-05-12 NOTE — Care Management Note (Signed)
CARE MANAGEMENT NOTE 05/12/2014  Patient:  [name] [name],[name] [name]   Account Number:  [id number]  Date Initiated:  05/12/2014  Documentation initiated by:  Vance PeperBRADY,Starlet Gallentine  Subjective/Objective Assessment:   80 yr old female s/p Left total hip arthroplasty.     Action/Plan:   [facility] Social worker is aware.  D/C is on hold for today-MD wants to r/o ileus.   Anticipated DC Date:  05/13/2014   Anticipated DC Plan:  SKILLED NURSING FACILITY  In-house referral  Clinical Social Worker      DC Planning Services  CM consult      Choice offered to / List presented to:             Status of service:  Completed, signed off Medicare Important Message given?  YES (If response is "NO", the following Medicare IM given date fields will be blank) Date Medicare IM given:  05/12/2014 Medicare IM given by:  Vance PeperBRADY,Vanesha [place] Date Additional Medicare IM given:   Additional Medicare IM given by:    Discharge Disposition:  SKILLED NURSING FACILITY  Per UR Regulation:  Reviewed for med. necessity/level of care/duration of stay

## 2014-05-12 NOTE — Progress Notes (Signed)
     Subjective:  Patient reports pain as moderate to severe.  Pain is located in the abdomen diffusely.  No pain or complaints related to the hip.  Pt reports No BM, but some passage of gas.  Several episodes of Vomiting with nausea last night and this morning that were white/clear and some that was brown.  No CP or SOB.  Objective:   VITALS:   Filed Vitals:   05/11/14 1955 05/11/14 2000 05/12/14 0000 05/12/14 0549  BP: 139/40   134/53  Pulse: 91   79  Temp: 98.1 F (36.7 C)   98.3 F (36.8 C)  TempSrc: Oral   Oral  Resp: 18 18 18    Height:      Weight:      SpO2: 96% 96% 96% 96%   Awake alert and oriented.  Sitting up in bed with emesis basin in abdominal discomfort and distension. Abdomen soft but moderately distended.  Active, infrequent bowel sounds.  Moderately tender to deep palpation diffusely. Sensation intact distally Dorsiflexion/Plantar flexion intact Incision: dressing C/D/I and scant drainage   Lab Results  Component Value Date   WBC 13.8* 05/12/2014   HGB 9.2* 05/12/2014   HCT 27.8* 05/12/2014   MCV 93.3 05/12/2014   PLT 252 05/12/2014   BMET    Component Value Date/Time   NA 137 05/11/2014 0640   K 4.0 05/11/2014 0640   CL 99 05/11/2014 0640   CO2 27 05/11/2014 0640   GLUCOSE 112* 05/11/2014 0640   BUN 17 05/11/2014 0640   CREATININE 1.02 05/11/2014 0640   CALCIUM 8.6 05/11/2014 0640   GFRNONAA 51* 05/11/2014 0640   GFRAA 59* 05/11/2014 0640     Assessment/Plan: 3 Days Post-Op   Principal Problem:   Osteoarthritis of left hip Active Problems:   Hip arthritis Partial Obstruction vs Illeus   Add Reglan.  KUB today, Change diet to clears and proceed with bowel regimen.  Hold SNF transfer.   Up with therapy WBAT Dry Dressings PRN   Haskel KhanDOUGLAS PARRY, BRANDON 05/12/2014, 7:24 AM   Teryl LucyJoshua Candies [place], MD Cell [phone]

## 2014-05-12 NOTE — Progress Notes (Signed)
CSW provided pt and son bed offers. Pt son requesting that CSW see if [facility] [place] [facility] able to provide [facility]. CSW faxed clinicals to [facility]. CSW to await review to determine whether they can offer placement. Pt and son aware that they will need to have a back offer as pt could be ready for discharge 05/12/14.  Theresia BoughNorma Arris [name], MSW, LCSW [phone]

## 2014-05-12 NOTE — Progress Notes (Signed)
Pt alert and oriented up using BSC attempting to have a bowel movement at 1615. At 1620 pt became drowsy and would not arouse. Pts MAR was reviewed and pt had received 12.5mg  of IV phenergan at 1604 and 0.5mg  of IV dilaudid at 1739.  After multiple attempts to wake patient rapid response was called and pt status was discussed. Vital signs were taken at 1842: BP 97/49, HR 96, O2 97% on room air. CBG: 161. After obtaining vitals while sitting on BSC pt became alert and oriented x4. Patient was put back to bed and vital signs were reassess: BP 128/50, HR 87, O@ 94% on room air.  No complaints of pain, nausea, or dizziness once put back to bed. Will continue to monitor.

## 2014-05-12 NOTE — Progress Notes (Signed)
PT Cancellation Note  Patient Details Name: [name] [name] MRN: [id number] DOB: 04/24/1934   Cancelled Treatment:    Reason Eval/Treat Not Completed: Medical issues which prohibited therapy (Patient continues to be nauseated.)  Patient reports feeling poorly and declines PT again this pm.  Will return tomorrow for PT session as appropriate.   Vena AustriaDavis, [place] Hanlon H 05/12/2014, 2:26 PM Durenda HurtSusan H. Renaldo Fiddleravis, PT, [facility] Acute Rehab Services Pager [phone]

## 2014-05-13 LAB — GLUCOSE, CAPILLARY
GLUCOSE-CAPILLARY: 138 mg/dL — AB (ref 70–99)
GLUCOSE-CAPILLARY: 141 mg/dL — AB (ref 70–99)
Glucose-Capillary: 133 mg/dL — ABNORMAL HIGH (ref 70–99)
Glucose-Capillary: 137 mg/dL — ABNORMAL HIGH (ref 70–99)

## 2014-05-13 NOTE — Progress Notes (Signed)
Subjective: 4 Days Post-Op Procedure(s) (LRB): LEFT TOTAL HIP ARTHROPLASTY (Left) Patient reports pain as 1 on 0-10 scale.  [name] [name] is feeling much better today.  No nausea/vomiting since last night.  Tolerated clear liquid diet and feels as though she can advance to solids at this point.  Positive flatus, but still no bm.  No abdominal pain, shortness of breath , or chest pain today.  Of note, rapid response was called last night due to inability to arouse patient.  It was noted that she was given iv phenergan as well as dilaudid around the same time which was most likely the cause of this.  Patient was finally able to be aroused and has been doing ok ever since.    Objective: Vital signs in last 24 hours: Temp:  [97.8 F (36.6 C)-98.8 F (37.1 C)] 97.8 F (36.6 C) (08/08 0447) Pulse Rate:  [71-96] 71 (08/08 0447) Resp:  [16-18] 18 (08/08 0800) BP: (97-149)/(44-64) 108/52 mmHg (08/08 0447) SpO2:  [94 %-97 %] 95 % (08/08 0800)  Intake/Output from previous day: 08/07 0701 - 08/08 0700 In: 645 [P.O.:360; I.V.:260] Out: -  Intake/Output this shift:     Recent Labs  05/11/14 0640 05/12/14 0530  HGB 8.7* 9.2*    Recent Labs  05/11/14 0640 05/12/14 0530  WBC 13.5* 13.8*  RBC 2.73* 2.98*  HCT 26.2* 27.8*  PLT 179 252    Recent Labs  05/11/14 0640  NA 137  K 4.0  CL 99  CO2 27  BUN 17  CREATININE 1.02  GLUCOSE 112*  CALCIUM 8.6   No results found for this basename: LABPT, INR,  in the last 72 hours  Neurologically intact ABD soft Neurovascular intact Sensation intact distally Intact pulses distally Dorsiflexion/Plantar flexion intact Compartment soft Negative homen's bilaterally No drainage noted through dressing  Assessment/Plan: 4 Days Post-Op Procedure(s) (LRB): LEFT TOTAL HIP ARTHROPLASTY (Left) Advance diet Up with therapy D/C IV fluids Discharge to SNF most likely on Monday.  Family would like to make sure patient is stable for a period of time  before transferring to SNF.   WBAT LLE Advance diet to carb modified   ANTON, M. LINDSEY 05/13/2014, 10:11 AM

## 2014-05-13 NOTE — Progress Notes (Signed)
Physical Therapy Treatment Patient Details Name: [name] [name] MRN: [id number] DOB: 07-08-1934 Today's Date: 05/13/2014    History of Present Illness 80 yo female s/p posterior L THA -posterior approach    PT Comments    Patient is progressing towards physical therapy goals, ambulating up to 50 feet x2 with min guard while using a rolling walker. Min assist with bed mobility and transfers. Reviewed therapeutic exercises and patient is able to correctly state 3/3 posterior hip precautions. Patient will greatly benefit from continued skilled physical therapy services at SNF to further improve independence with functional mobility.    Follow Up Recommendations  SNF     Equipment Recommendations  None recommended by PT    Recommendations for Other Services OT consult     Precautions / Restrictions Precautions Precautions: Posterior Hip Precaution Comments: Pt able to correctly state 3/3 posterior hip precautions Restrictions Weight Bearing Restrictions: Yes LLE Weight Bearing: Weight bearing as tolerated    Mobility  Bed Mobility Overal bed mobility: Needs Assistance Bed Mobility: Supine to Sit     Supine to sit: Min assist;HOB elevated     General bed mobility comments: Min assist for LLE support out of bed. HOB elevated slightly. VC for technique. Requires extra time and relies heavily on bed rail.  Transfers Overall transfer level: Needs assistance Equipment used: Rolling walker (2 wheeled) Transfers: Sit to/from Stand Sit to Stand: Min assist         General transfer comment: Min assist from lowest bed setting for boost, performed x2. Dizziness reported upon initial stand, resolved in <1 minute after sitting. Progressed to close min guard with sit<>stand from recliner with VC for hand placement.  Ambulation/Gait Ambulation/Gait assistance: Min guard Ambulation Distance (Feet): 50 Feet (x2) Assistive device: Rolling walker (2 wheeled) Gait Pattern/deviations:  Step-to pattern;Step-through pattern;Decreased step length - right;Decreased stance time - left;Antalgic     General Gait Details: VC for sequencing of gait, upright posture and forward gaze. No loss of balance. Required several standing breaks during bouts to complete distance. Required seated rest break between bouts due to reported fatigue.   Stairs            Wheelchair Mobility    Modified Rankin (Stroke Patients Only)       Balance                                    Cognition Arousal/Alertness: Awake/alert Behavior During Therapy: WFL for tasks assessed/performed Overall Cognitive Status: Within Functional Limits for tasks assessed                      Exercises Total Joint Exercises Ankle Circles/Pumps: AROM;Both;10 reps;Seated Quad Sets: Both;10 reps;Seated;Strengthening Gluteal Sets: Strengthening;Both;10 reps;Seated Long Arc Quad: AROM;Left;15 reps;Seated    General Comments        Pertinent Vitals/Pain Pain Assessment: 0-10 Pain Score:  (Reports pain as "moderate") Pain Location: left hip Pain Intervention(s): Limited activity within patient's tolerance;Monitored during session;Repositioned    Home Living                      Prior Function            PT Goals (current goals can now be found in the care plan section) Acute Rehab PT Goals PT Goal Formulation: With patient Time For Goal Achievement: 05/24/14 Potential to Achieve Goals: Good Progress towards PT goals:  Progressing toward goals    Frequency  7X/week    PT Plan Current plan remains appropriate    Co-evaluation             End of Session   Activity Tolerance: Patient tolerated treatment well Patient left: in chair;with call bell/phone within reach;with family/visitor present     Time: 1610-96041407-1432 PT Time Calculation (min): 25 min  Charges:  $Gait Training: 8-22 mins $Therapeutic Exercise: 8-22 mins                    G Codes:       [employer] [name], [place] [phone]  [place] MountBarbour, Manvir Prabhu S 05/13/2014, 2:44 PM

## 2014-05-14 LAB — GLUCOSE, CAPILLARY
Glucose-Capillary: 117 mg/dL — ABNORMAL HIGH (ref 70–99)
Glucose-Capillary: 105 mg/dL — ABNORMAL HIGH (ref 70–99)
Glucose-Capillary: 87 mg/dL (ref 70–99)
Glucose-Capillary: 136 mg/dL — ABNORMAL HIGH (ref 70–99)

## 2014-05-14 NOTE — Progress Notes (Signed)
Subjective: 5 Days Post-Op Procedure(s) (LRB): LEFT TOTAL HIP ARTHROPLASTY (Left) Patient reports pain as 1 on 0-10 scale.  Patient with minimal pain.  She is sitting up resting comfortably in her chair this am.  She has been tolerating solid diet since yesterday.  No nausea and no vomiting.  No lightheadedness/dizziness.  Positive flatus and positive bm.  I think she has turned a corner over the weekend and is doing great.  Objective: Vital signs in last 24 hours: Temp:  [98 F (36.7 C)-98.2 F (36.8 C)] 98.2 F (36.8 C) (08/09 0608) Pulse Rate:  [65-74] 65 (08/09 0608) Resp:  [12-18] 12 (08/09 0608) BP: (100-110)/(46-52) 110/46 mmHg (08/09 0608) SpO2:  [93 %-97 %] 97 % (08/09 0608)  Intake/Output from previous day: 08/08 0701 - 08/09 0700 In: 360 [P.O.:360] Out: -  Intake/Output this shift: Total I/O In: 240 [P.O.:240] Out: 200 [Urine:200]   Recent Labs  05/12/14 0530  HGB 9.2*    Recent Labs  05/12/14 0530  WBC 13.8*  RBC 2.98*  HCT 27.8*  PLT 252   No results found for this basename: NA, K, CL, CO2, BUN, CREATININE, GLUCOSE, CALCIUM,  in the last 72 hours No results found for this basename: LABPT, INR,  in the last 72 hours  Neurologically intact ABD soft Neurovascular intact Sensation intact distally Intact pulses distally Dorsiflexion/Plantar flexion intact Compartment soft Negative homen's bilaterally No drainage noted through dressing  Assessment/Plan: 5 Days Post-Op Procedure(s) (LRB): LEFT TOTAL HIP ARTHROPLASTY (Left) Advance diet Up with therapy Discharge to SNF tomorrow wbat LLE   ANTON, Huston FoleyM. LINDSEY 05/14/2014, 9:55 AM

## 2014-05-14 NOTE — Progress Notes (Signed)
Physical Therapy Treatment Patient Details Name: [name] [name] MRN: [id number] DOB: 04/11/1934 Today's Date: 05/14/2014    History of Present Illness 80 yo female s/p posterior L THA -posterior approach    PT Comments    Patient is progressing well towards acute physical therapy goals, ambulating up to 70 feet with min guard while using a rolling walker. Verbalizes 3/3 posterior hip precautions however, needs intermittent cues during ambulation to safely maintain these precautions. Tolerating therapeutic exercises well. Patient will continue to benefit from skilled physical therapy services at SNF to further improve independence with functional mobility.    Follow Up Recommendations  SNF     Equipment Recommendations  None recommended by PT    Recommendations for Other Services OT consult     Precautions / Restrictions Precautions Precautions: Posterior Hip Precaution Comments: Pt able to correctly state 3/3 posterior hip precautions Restrictions Weight Bearing Restrictions: Yes LLE Weight Bearing: Weight bearing as tolerated    Mobility  Bed Mobility Overal bed mobility: Needs Assistance Bed Mobility: Supine to Sit     Supine to sit: HOB elevated;Min guard     General bed mobility comments: Min guard for safety with VC for technique. HOB elevated minimally. Minimal use of rail.  Transfers Overall transfer level: Needs assistance Equipment used: Rolling walker (2 wheeled) Transfers: Sit to/from Stand Sit to Stand: Min guard         General transfer comment: Min guard for safety. VC for hand placement. Performed from lowest bed setting and recliner.  Ambulation/Gait Ambulation/Gait assistance: Min guard Ambulation Distance (Feet): 70 Feet (x2) Assistive device: Rolling walker (2 wheeled) Gait Pattern/deviations: Step-through pattern;Decreased step length - right;Decreased stance time - left;Antalgic   Gait velocity interpretation: Below normal speed for  age/gender General Gait Details: VC for keeping LLE neutral as it tends to internally rotate at times. Slow and guarded gait with no loss of balance while using rolling walker. VC for maintaining posterior hip precautions with turns towards operated side.   Stairs            Wheelchair Mobility    Modified Rankin (Stroke Patients Only)       Balance                                    Cognition Arousal/Alertness: Awake/alert Behavior During Therapy: WFL for tasks assessed/performed Overall Cognitive Status: Within Functional Limits for tasks assessed                      Exercises Total Joint Exercises Ankle Circles/Pumps: AROM;Both;10 reps;Seated Quad Sets: Both;10 reps;Seated;Strengthening Hip ABduction/ADduction: AROM;Left;10 reps;Supine Long Arc Quad: AROM;Left;15 reps;Seated    General Comments        Pertinent Vitals/Pain Pain Assessment: 0-10 Pain Score: 5  Pain Location: left hip Pain Descriptors / Indicators: Aching Pain Intervention(s): Limited activity within patient's tolerance;Monitored during session;Patient requesting pain meds-RN notified;Repositioned    Home Living                      Prior Function            PT Goals (current goals can now be found in the care plan section) Acute Rehab PT Goals PT Goal Formulation: With patient Time For Goal Achievement: 05/24/14 Potential to Achieve Goals: Good Progress towards PT goals: Progressing toward goals    Frequency  7X/week    PT  Plan Current plan remains appropriate    Co-evaluation             End of Session   Activity Tolerance: Patient tolerated treatment well Patient left: in chair;with call bell/phone within reach     Time: 1610-9604 PT Time Calculation (min): 25 min  Charges:  $Gait Training: 8-22 mins $Therapeutic Exercise: 8-22 mins                    G Codes:      [employer], [place] [phone]  Berton Mount 05/14/2014,  2:38 PM

## 2014-05-15 LAB — GLUCOSE, CAPILLARY
Glucose-Capillary: 123 mg/dL — ABNORMAL HIGH (ref 70–99)
Glucose-Capillary: 127 mg/dL — ABNORMAL HIGH (ref 70–99)

## 2014-05-15 NOTE — Clinical Social Work Note (Signed)
Clinical Social Worker facilitated patient discharge including contacting patient family and facility to confirm patient discharge plans.  Clinical information faxed to facility and family agreeable with plan.  CSW arranged ambulance transport via [facility] [place].  RN to call report prior to discharge.  Clinical Social Worker will sign off for now as social work intervention is no longer needed. Please consult us again if new need arises.  Macario GoldsJesse Nolita Kutter, [place] [phone]

## 2014-05-15 NOTE — Discharge Summary (Signed)
Physician Discharge Summary  Patient ID: [name] [name] MRN: [id number] DOB/AGE: 78/05/1934 78 y.o.  Admit date: 05/09/2014 Discharge date: 05/15/2014  Admission Diagnoses:  Osteoarthritis of left hip  Discharge Diagnoses:  Principal Problem:   Osteoarthritis of left hip Active Problems:   Hip arthritis   Ileus, postoperative   Past Medical History  Diagnosis Date  . CAD (coronary artery disease)     acute MI in 3/08 - taxus stent  . Hypercholesteremia     takes Crestor daily  . GERD (gastroesophageal reflux disease)   . Carotid stenosis     mild  . HTN (hypertension)     takes Amlodpine and Carvedilol daily  . Dry cough   . Bronchitis     in Dec  . Ankle edema     pt lasix and hctz daily  . Headache(784.0)     occasionally  . Arthritis     right hip  . Myocardial infarction     pt unsure but thinks about 2257yrs ago;is taking Plavix daily   . Constipation     takes OTC stool softener as needed  . Nocturia   . Hypothyroidism     takes Synthroid daily  . Diabetes mellitus     pt doesn't take any meds for it  . Cataract     left;not ready to come off  . Primary osteoarthritis of right hip 10/27/2011  . DJD (degenerative joint disease) of hip     right  . Hx of cardiovascular stress test     Lexiscan Myoview (04/2014):  No ischemia, EF 64%, Low Risk  . Osteoarthritis of left hip 05/09/2014  . Ileus, postoperative 05/12/2014    Surgeries: Procedure(s): LEFT TOTAL HIP ARTHROPLASTY on 05/09/2014   Consultants (if any):    Discharged Condition: Improved  Hospital Course: [name] [name] is an 80 y.o. female who was admitted 05/09/2014 with a diagnosis of Osteoarthritis of left hip and went to the operating room on 05/09/2014 and underwent the above named procedures.    She was given perioperative antibiotics:  Anti-infectives   Start     Dose/Rate Route Frequency Ordered Stop   05/09/14 1815  ceFAZolin (ANCEF) IVPB 2 g/50 mL premix     2 g 100 mL/hr over 30 Minutes  Intravenous Every 6 hours 05/09/14 1755 05/10/14 0045   05/09/14 0600  ceFAZolin (ANCEF) IVPB 2 g/50 mL premix     2 g 100 mL/hr over 30 Minutes Intravenous On call to O.R. 05/08/14 1409 05/09/14 1049    .  She was given sequential compression devices, early ambulation, and xarelto for DVT prophylaxis.  She had nausea and vomiting and was unable to have BM by Friday, and [facility]  She benefited maximally from the hospital stay and there were no complications.    Recent vital signs:  Filed Vitals:   05/15/14 0614  BP: 112/50  Pulse: 58  Temp: 98 F (36.7 C)  Resp: 20    Recent laboratory studies:  Lab Results  Component Value Date   HGB 9.2* 05/12/2014   HGB 8.7* 05/11/2014   HGB 9.4* 05/10/2014   Lab Results  Component Value Date   WBC 13.8* 05/12/2014   PLT 252 05/12/2014   Lab Results  Component Value Date   INR 0.99 04/26/2014   Lab Results  Component Value Date   NA 137 05/11/2014   K 4.0 05/11/2014   CL 99 05/11/2014  CO2 27 05/11/2014   BUN 17 05/11/2014   CREATININE 1.02 05/11/2014   GLUCOSE 112* 05/11/2014    Discharge Medications:     Medication List    STOP taking these medications       acetaminophen 500 MG tablet  Commonly known as:  TYLENOL     aspirin EC 81 MG tablet     clopidogrel 75 MG tablet  Commonly known as:  PLAVIX      TAKE these medications       baclofen 10 MG tablet  Commonly known as:  LIORESAL  Take 1 tablet (10 mg total) by mouth 3 (three) times daily. As needed for muscle spasm     enalapril 20 MG tablet  Commonly known as:  VASOTEC  Take 1 tablet (20 mg total) by mouth 2 (two) times daily.     furosemide 40 MG tablet  Commonly known as:  LASIX  - Take 20-40 mg by mouth 2 (two) times daily. Take 1 tablet in the morning  - Take 0.5 tablet every evening     hydrochlorothiazide 25 MG tablet  Commonly known as:  HYDRODIURIL  Take 25 mg by mouth daily.     levothyroxine 100 MCG  tablet  Commonly known as:  SYNTHROID, LEVOTHROID  Take 100 mcg by mouth daily.     nitroGLYCERIN 0.4 MG SL tablet  Commonly known as:  NITROSTAT  Place 0.4 mg under the tongue every 5 (five) minutes as needed for chest pain.     ondansetron 4 MG tablet  Commonly known as:  ZOFRAN  Take 1 tablet (4 mg total) by mouth every 8 (eight) hours as needed for nausea or vomiting.     oxyCODONE-acetaminophen 5-325 MG per tablet  Commonly known as:  ROXICET  Take 1-2 tablets by mouth every 6 (six) hours as needed for severe pain.     potassium chloride SA 20 MEQ tablet  Commonly known as:  K-DUR,KLOR-CON  Take 20 mEq by mouth daily.     rivaroxaban 10 MG Tabs tablet  Commonly known as:  XARELTO  Take 1 tablet (10 mg total) by mouth daily.     rosuvastatin 40 MG tablet  Commonly known as:  CRESTOR  Take 1 tablet (40 mg total) by mouth daily.        Diagnostic Studies: Dg Chest 2 View  04/26/2014   CLINICAL DATA:  Left total hip arthroplasty, preop.  EXAM: CHEST  2 VIEW  COMPARISON:  10/22/2011  FINDINGS: Minimal scarring in the left lung base. Right lung is clear. Heart is normal size. No effusions or acute bony abnormality.  IMPRESSION: No active cardiopulmonary disease.   Electronically Signed   By: Charlett Nose M.D.   On: 04/26/2014 13:49   Dg Abd 1 View  05/12/2014   CLINICAL DATA:  Abdominal pain with nausea and vomiting  EXAM: ABDOMEN - 1 VIEW  COMPARISON:  None.  FINDINGS: Bowel gas pattern overall is unremarkable. No obstruction or free air is seen on this supine examination. There are total hip prostheses bilaterally.  IMPRESSION: Bowel gas pattern unremarkable on this supine only examination.   Electronically Signed   By: Bretta Bang M.D.   On: 05/12/2014 08:24   Dg Pelvis Portable  05/09/2014   CLINICAL DATA:  Postop left hip.  EXAM: PORTABLE PELVIS 1-2 VIEWS  COMPARISON:  10/27/2011  FINDINGS: New left hip prosthesis with acetabular and femoral components well seated and  aligned on this single view. There is  no acute fracture or evidence of an operative complication.  Right hip prosthesis is well-seated and aligned.  IMPRESSION: Well-positioned left hip prosthesis.   Electronically Signed   By: Amie Portland M.D.   On: 05/09/2014 13:58   Dg Hip Portable 1 View Left  05/09/2014   CLINICAL DATA:  Postop left hip  EXAM: PORTABLE LEFT HIP - 1 VIEW  COMPARISON:  None.  FINDINGS: New left hip prosthesis is well aligned on this single cross-table lateral view. This along with AP view confirms normal alignment of the acetabular and femoral prosthetic components. No acute fracture or evidence of an operative complication.  IMPRESSION: Well aligned left hip prosthesis.   Electronically Signed   By: Amie Portland M.D.   On: 05/09/2014 13:59    Disposition: 03-Skilled Nursing Facility      Discharge Instructions   Posterior total hip precautions    Complete by:  As directed      Weight bearing as tolerated    Complete by:  As directed            Follow-up Information   Follow up with Eulas Post, MD. Schedule an appointment as soon as possible for a visit in 2 weeks.   Specialty:  Orthopedic Surgery   Contact information:   [address] [place] [address] [phone]        Signed: Eulas Post 05/15/2014, 7:40 AM

## 2014-05-15 NOTE — Progress Notes (Signed)
CSW has provided handoff to today's CSW Macario GoldsJesse Scinto [phone].  Theresia BoughNorma Mariyah Upshaw, MSW, LCSW [phone]

## 2014-05-15 NOTE — Progress Notes (Addendum)
     Subjective:  Feeling well and hoping to be discharged today.  Patient reports pain as mild.  Sitting comfortably in bed.  No hip or abdominal pain.  Some nausea last night now resolved.  No vomiting.  Tolerating solid diet since 05/13/14.  Positive flatus.  BM 05/14/14.    Objective:   VITALS:   Filed Vitals:   05/14/14 1243 05/14/14 2008 05/14/14 2038 05/15/14 0614  BP: 123/46 78/55 118/44 112/50  Pulse: 70 72  58  Temp: 98.4 F (36.9 C) 97.8 F (36.6 C)  98 F (36.7 C)  TempSrc: Oral Oral  Oral  Resp: 20 20  20   Height:      Weight:      SpO2: 100% 100%  98%    Neurologically intact ABD soft Neurovascular intact Sensation intact distally Dorsiflexion/Plantar flexion intact Incision: no drainage   Lab Results  Component Value Date   WBC 13.8* 05/12/2014   HGB 9.2* 05/12/2014   HCT 27.8* 05/12/2014   MCV 93.3 05/12/2014   PLT 252 05/12/2014   BMET    Component Value Date/Time   NA 137 05/11/2014 0640   K 4.0 05/11/2014 0640   CL 99 05/11/2014 0640   CO2 27 05/11/2014 0640   GLUCOSE 112* 05/11/2014 0640   BUN 17 05/11/2014 0640   CREATININE 1.02 05/11/2014 0640   CALCIUM 8.6 05/11/2014 0640   GFRNONAA 51* 05/11/2014 0640   GFRAA 59* 05/11/2014 0640     Assessment/Plan: 6 Days Post-Op   Principal Problem:   Osteoarthritis of left hip Active Problems:   Hip arthritis   Ileus, postoperative   Advance diet Up with therapy Discharge to SNF WBAT Dry Dressing PRN   Haskel KhanDOUGLAS PARRY, BRANDON 05/15/2014, 7:14 AM  Seen and agree.   Teryl LucyJoshua Naysha Sholl, MD Cell [phone]

## 2014-06-29 ENCOUNTER — Other Ambulatory Visit: Payer: Self-pay | Admitting: Cardiology

## 2014-07-28 ENCOUNTER — Ambulatory Visit: Payer: PRIVATE HEALTH INSURANCE | Admitting: Cardiology

## 2014-07-31 ENCOUNTER — Encounter: Payer: Self-pay | Admitting: Cardiology

## 2014-07-31 ENCOUNTER — Ambulatory Visit (INDEPENDENT_AMBULATORY_CARE_PROVIDER_SITE_OTHER): Payer: PRIVATE HEALTH INSURANCE | Admitting: Cardiology

## 2014-07-31 VITALS — BP 156/82 | HR 78 | Ht <= 58 in | Wt 146.1 lb

## 2014-07-31 DIAGNOSIS — E785 Hyperlipidemia, unspecified: Secondary | ICD-10-CM

## 2014-07-31 DIAGNOSIS — I5032 Chronic diastolic (congestive) heart failure: Secondary | ICD-10-CM

## 2014-07-31 DIAGNOSIS — I2510 Atherosclerotic heart disease of native coronary artery without angina pectoris: Secondary | ICD-10-CM

## 2014-07-31 LAB — LIPID PANEL
CHOLESTEROL: 170 mg/dL (ref 0–200)
HDL: 45.7 mg/dL (ref >39.00)
LDL CALC: 105 mg/dL — AB (ref 0–99)
NonHDL: 124.3
TRIGLYCERIDES: 96 mg/dL (ref 0.0–149.0)
Total CHOL/HDL Ratio: 4
VLDL: 19.2 mg/dL (ref 0.0–40.0)

## 2014-07-31 MED ORDER — ENALAPRIL MALEATE 20 MG PO TABS: 20.0000 mg | ORAL_TABLET | Freq: Two times a day (BID) | ORAL | Status: DC

## 2014-07-31 MED ORDER — ENALAPRIL MALEATE 20 MG PO TABS: 20.0000 mg | ORAL_TABLET | Freq: Every day | ORAL | Status: DC

## 2014-07-31 NOTE — Progress Notes (Signed)
Patient ID: [name] [name], female   DOB: 11/04/1933, 80 y.o.   MRN: [id number] PCP: Dr. Illene RegulusSelvidge  80 yo with history of CAD s/p PCI returns for cardiology followup today.  She initially had depressed LV EF after her MI but this rose back to normal range on echo in 2009.  She had an echo done in 3/11 showing preserved LV systolic function and mild diastolic dysfunction.  ETT-myoview was done in 7/15 with no ischemia or infarction.  She had left THR without complication in 8/15.   She is stable symptomatically.  Hip pain much improved since THR.  She walks with a cane or rolling walker.  No exertional dyspnea.  No chest pain.  No orthopnea, PND, or tachypalpitations.  BP is high today. At home, SBP has been running in the 160s.     Labs (4/11): LDL 61, HDL 42, LFTs normal Labs (0/866/11): K 4.5, creatinine 0.7, BNP 40 Labs (10/11): K 4.1, creatinine 0.7, LDL 69, HDL 41 Labs (4/12): K 4.4, creatinine 0.8, LDL 110, HDL 50 Labs (7/12): K 3.4, creatinine 0.8, BNP 19, LDL 50, HDL 50 Labs (1/13): K 4.1, creatinine 0.72 Labs (4/13): LDL 72, HDL 33, K 3.9, creatinine 0.9 Labs (1/14): K 3.5, creatinine 0.8, LDL 123, HDL 48 Labs (8/15): K 4, creatinine 1.02  Allergies (verified):  No Known Drug Allergies  Past Medical History: 1. Coronary artery disease.  The patient had an acute anterior MI in March 2008.  She had 90% LAD stenosis with thrombus.  She did have 2.75 x 12 Taxus drug-eluting stent placed in the proximal LAD.  She did also have 80% first obtuse marginal stenosis, 70% distal circumflex stenosis and 50% proximal RCA stenosis.  Her EF was initially 30-35% at the time of her MI; however, it rose after primary PCI to 60%.  ETT-myoview (3/11): EF 68%, hypertensive BP response, normal perfusion at rest and stress (negative study).  Lexiscan Cardiolite (7/15) with EF 64%, no ischemia or infarction.  2. Hypertension. 3. Hypercholesterolemia. 4. Gastroesophageal reflux disease. 5. Mild carotid stenosis  less than 50% bilaterally. Carotid dopplers (4/13) with mild plaque bilaterally.  6. Diastolic CHF: Echo (3/11) with EF 60-65%, mild diastolic dysfunction, mild MR, normal RV.  7. Osteoarthritis: s/p right THR and left THR.   Family History: No premature CAD  Social History: The [place].  She does not smoke or drink alcohol.  ROS:  All systems reviewed and negative except as per HPI.    Current Outpatient Prescriptions  Medication Sig Dispense Refill  . aspirin 81 MG tablet Take 81 mg by mouth daily.      . clopidogrel (PLAVIX) 75 MG tablet Take 75 mg by mouth daily.      . furosemide (LASIX) 40 MG tablet Take 20-40 mg by mouth 2 (two) times daily. Take 1 tablet in the morning Take 0.5 tablet every evening      . levothyroxine (SYNTHROID, LEVOTHROID) 100 MCG tablet Take 100 mcg by mouth daily.        . nitroGLYCERIN (NITROSTAT) 0.4 MG SL tablet Place 0.4 mg under the tongue every 5 (five) minutes as needed for chest pain.      . potassium chloride SA (K-DUR,KLOR-CON) 20 MEQ tablet Take 20 mEq by mouth daily.      . rosuvastatin (CRESTOR) 40 MG tablet Take 1 tablet (40 mg total) by mouth daily.  30 tablet  6  . spironolactone (ALDACTONE) 25 MG tablet  Take 25 mg by mouth daily.      . carvedilol (COREG) 25 MG tablet Take 1 tablet (25 mg total) by mouth 2 (two) times daily.      . enalapril (VASOTEC) 20 MG tablet Take 1 tablet (20 mg total) by mouth 2 (two) times daily.  60 tablet  6  . hydrochlorothiazide (HYDRODIURIL) 25 MG tablet Take 1 tablet (25 mg total) by mouth daily.      . [DISCONTINUED] warfarin (COUMADIN) 5 MG tablet Take 1 tablet (5 mg total) by mouth daily.  30 tablet  1   No current facility-administered medications for this visit.    BP 156/82  Pulse 78  Ht 4\' 10"  (1.473 m)  Wt 146 lb 1.9 oz (66.28 kg)  BMI 30.55 kg/m2  SpO2 96% General:  Well developed, well nourished, in no acute distress. Neck:  Neck supple, no  JVD. No masses, thyromegaly or abnormal cervical nodes. Lungs:  Clear bilaterally to auscultation and percussion. Heart:  Non-displaced PMI, chest non-tender; regular rate and rhythm, S1, S2.  +S4. 1/6 early systolic ejection-type murmur.  Carotid upstroke normal, left bruit. Pedals normal pulses. No edema.   Abdomen:  Bowel sounds positive; abdomen soft and non-tender without masses, organomegaly, or hernias noted. No hepatosplenomegaly. Extremities:  No clubbing or cyanosis. Neurologic:  Alert and oriented x 3. Psych:  Normal affect.  Assessment/Plan:  CAD, NATIVE VESSEL No chest pain, no ischemia on 7/15 Cardiolite. Continue ASA, Plavix, Coreg, ACEI, and statin. CHRONIC DIASTOLIC HEART FAILURE Euvolemic on exam. Stable NYHA class II symptoms. Continue current dose of Lasix.  HYPERLIPIDEMIA-MIXED  Check lipids today.    HYPERTENSION SBP running 150s-160s, too high.  I will increase her enalapril to 20 mg bid.  I will have her get a BMET in 2 wks.   Ectopic atrial rhythm Patient has had an ectopic atrial rhythm on prior ECGs.  However, HR is within a normal range so I do not think that there is any particular clinical significance to this.  She does not get lightheaded and has had no syncopal/presyncopal episodes.  This has been chronic.    Marca AnconaDalton Brexley Cutshaw 07/31/2014

## 2014-07-31 NOTE — Patient Instructions (Addendum)
Increase enalapril to 20mg  two times a day.   Your physician recommends that you have a lipid profile today.  Your physician recommends that you return for lab work in: 2 weeks--I have given you an order for this. Please fax the results to [phone].  Your physician wants you to follow-up in: 6 months with Dr Shirlee LatchMcLean. (April 2016). You will receive a reminder letter in the mail two months in advance. If you don't receive a letter, please call our office to schedule the follow-up appointment.

## 2014-08-17 ENCOUNTER — Other Ambulatory Visit: Payer: Self-pay | Admitting: Cardiology

## 2014-08-17 DIAGNOSIS — I4891 Unspecified atrial fibrillation: Secondary | ICD-10-CM

## 2014-08-23 NOTE — Telephone Encounter (Signed)
Dr Shirlee LatchMcLean said he did not prescribe Xarelto for her.

## 2014-08-24 ENCOUNTER — Other Ambulatory Visit: Payer: Self-pay | Admitting: *Deleted

## 2014-08-25 ENCOUNTER — Telehealth: Payer: Self-pay

## 2014-08-25 NOTE — Telephone Encounter (Signed)
Per [name] [name] patient was not to be on xarelto. A refill came in [name] and it was filled by [name] [name] in error. After talking to [name] [name] she said that [name] [name] states that the patient was placed on xarelto after her hip surgery in October 2015. I called the pharmacy to cancel the refill that was sent in  On 11.13.15 by [name] [name]. i also talked to the patient and explained every thing to her and she verbally understood

## 2014-09-25 ENCOUNTER — Encounter: Payer: Self-pay | Admitting: Cardiology

## 2015-01-21 IMAGING — CR DG ABDOMEN 1V
2 series · 2 of 2 positions shown · non-contrast
Comparison: None.

CLINICAL DATA: Abdominal pain with nausea and vomiting

EXAM:
ABDOMEN - 1 VIEW

[t abdomen supine (1 of 2)]
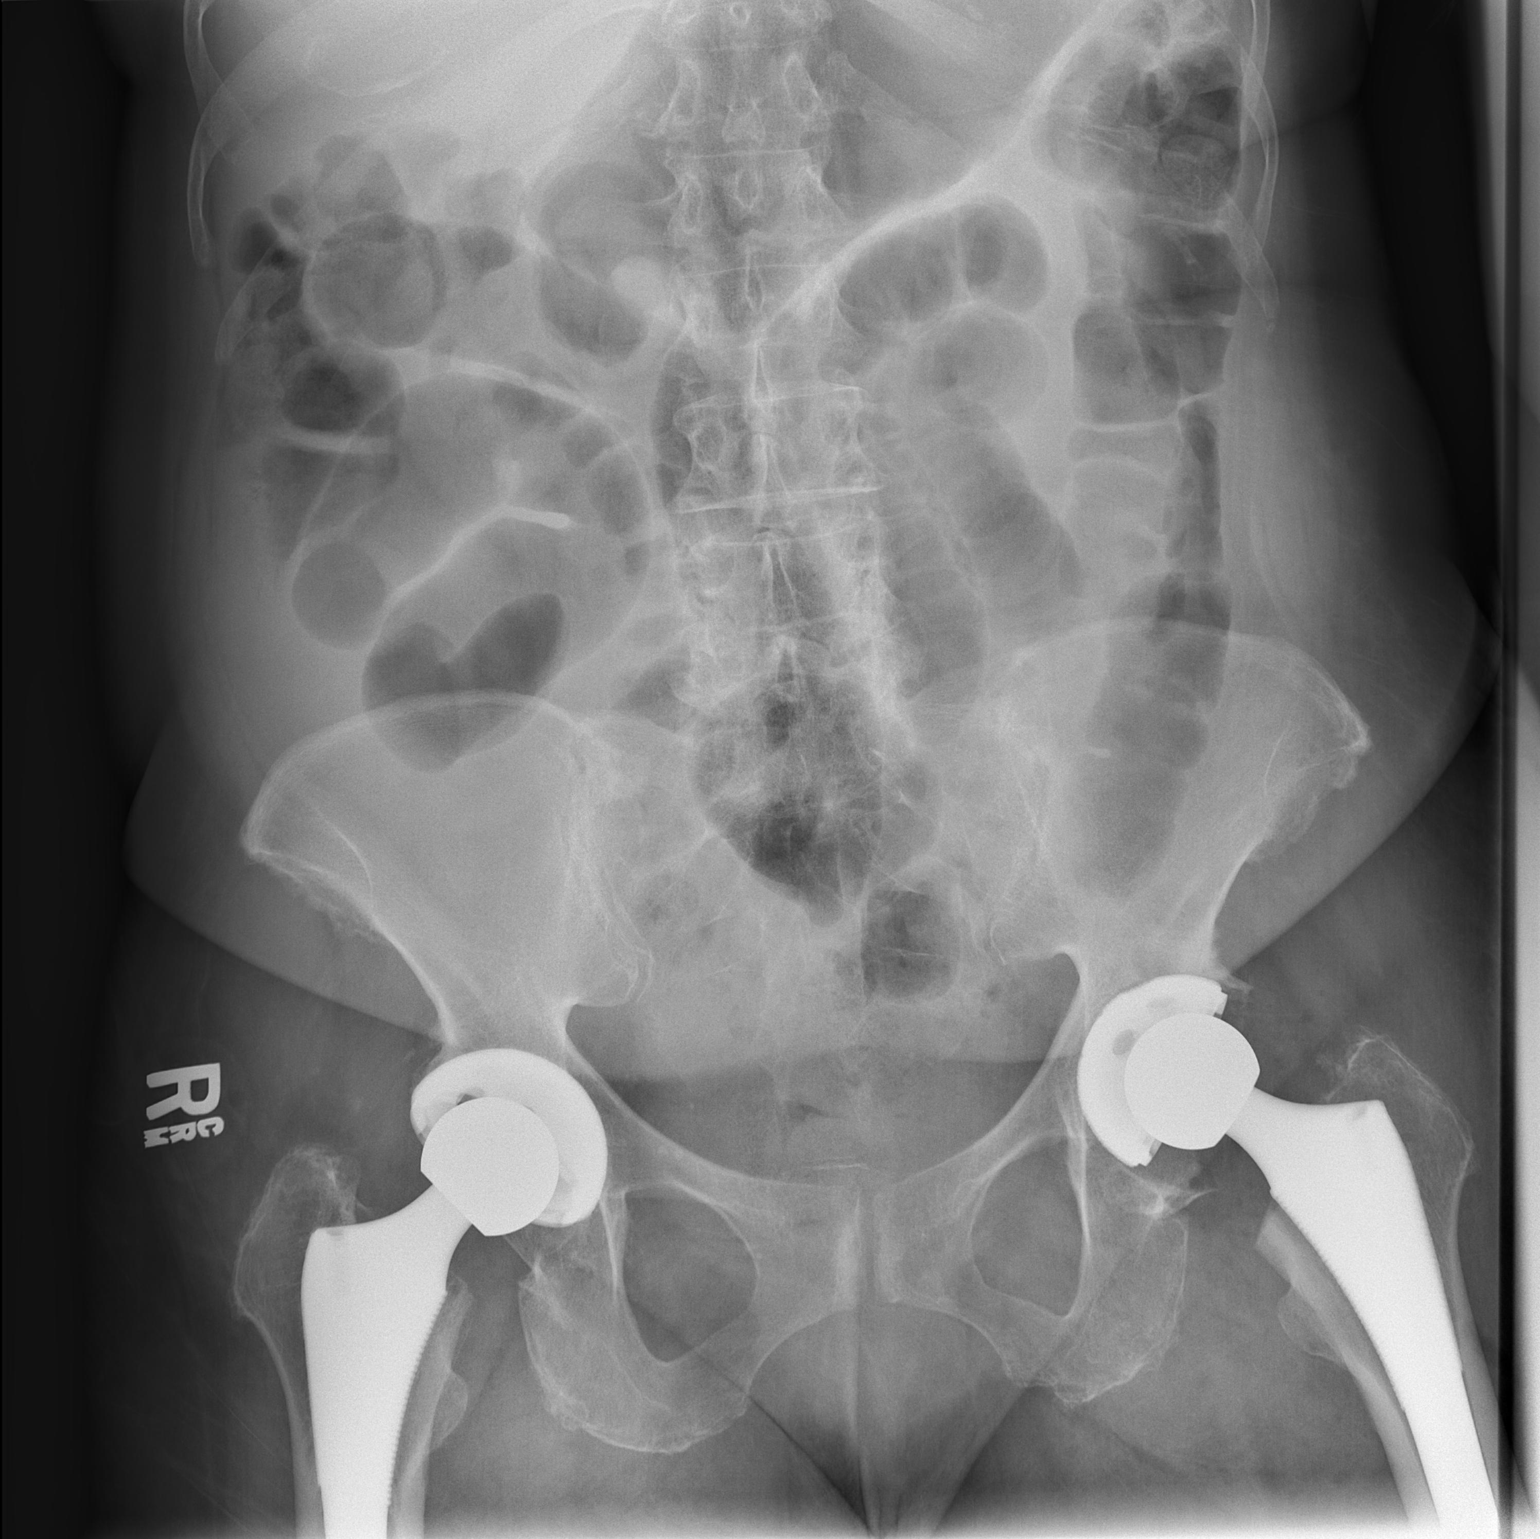

[t abdomen supine (2 of 2)]
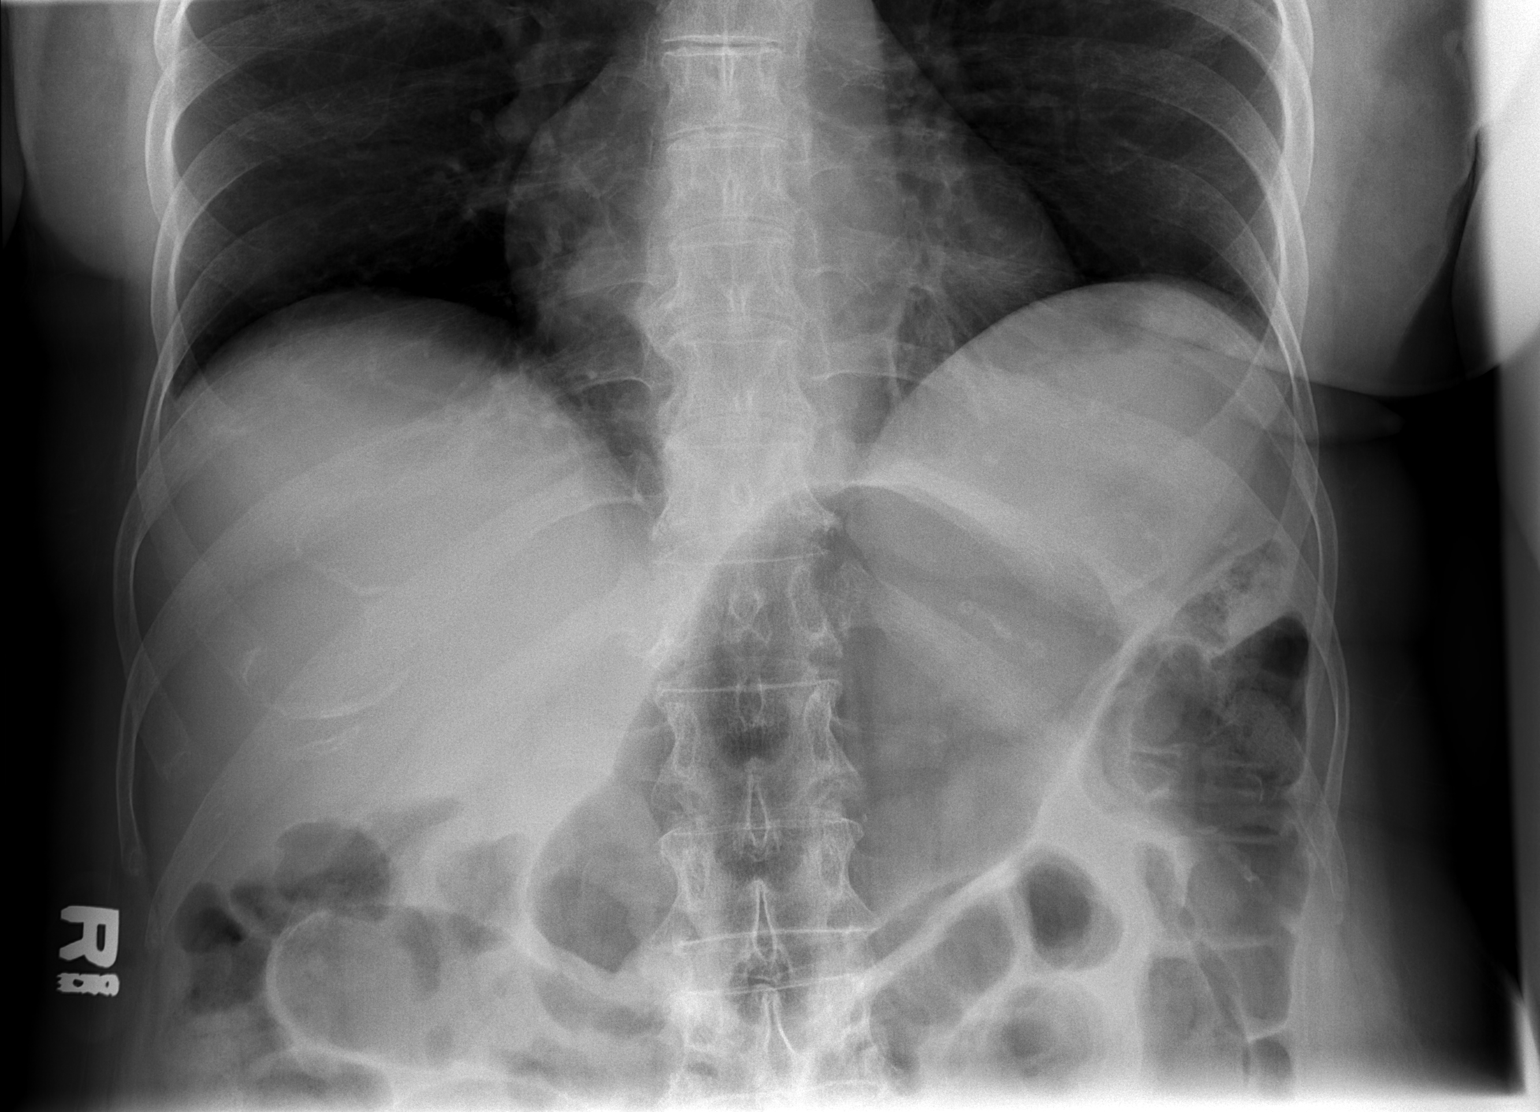

[2 of 2 positions shown; findings below may reference images not displayed]

FINDINGS: Bowel gas pattern overall is unremarkable. No obstruction or free
air is seen on this supine examination. There are total hip
prostheses bilaterally.
IMPRESSION: Bowel gas pattern unremarkable on this supine only examination.

## 2015-02-14 ENCOUNTER — Ambulatory Visit: Payer: Medicaid Other | Admitting: Cardiology

## 2015-02-22 ENCOUNTER — Ambulatory Visit (INDEPENDENT_AMBULATORY_CARE_PROVIDER_SITE_OTHER): Payer: Medicare Other | Admitting: Cardiology

## 2015-02-22 ENCOUNTER — Encounter: Payer: Self-pay | Admitting: Cardiology

## 2015-02-22 VITALS — BP 132/86 | HR 71 | Ht <= 58 in | Wt 141.0 lb

## 2015-02-22 DIAGNOSIS — E785 Hyperlipidemia, unspecified: Secondary | ICD-10-CM

## 2015-02-22 DIAGNOSIS — I5032 Chronic diastolic (congestive) heart failure: Secondary | ICD-10-CM

## 2015-02-22 DIAGNOSIS — I10 Essential (primary) hypertension: Secondary | ICD-10-CM

## 2015-02-22 DIAGNOSIS — I2510 Atherosclerotic heart disease of native coronary artery without angina pectoris: Secondary | ICD-10-CM

## 2015-02-22 LAB — CBC WITH DIFFERENTIAL/PLATELET
BASOS ABS: 0 10*3/uL (ref 0.0–0.1)
Basophils Relative: 0.7 % (ref 0.0–3.0)
EOS PCT: 2.1 % (ref 0.0–5.0)
Eosinophils Absolute: 0.1 10*3/uL (ref 0.0–0.7)
HEMATOCRIT: 37.4 % (ref 36.0–46.0)
HEMOGLOBIN: 12.7 g/dL (ref 12.0–15.0)
LYMPHS ABS: 2.3 10*3/uL (ref 0.7–4.0)
Lymphocytes Relative: 44.9 % (ref 12.0–46.0)
MCHC: 34 g/dL (ref 30.0–36.0)
MCV: 91 fl (ref 78.0–100.0)
MONO ABS: 0.6 10*3/uL (ref 0.1–1.0)
Monocytes Relative: 11.1 % (ref 3.0–12.0)
Neutro Abs: 2.1 10*3/uL (ref 1.4–7.7)
Neutrophils Relative %: 41.2 % — ABNORMAL LOW (ref 43.0–77.0)
PLATELETS: 230 10*3/uL (ref 150.0–400.0)
RBC: 4.11 Mil/uL (ref 3.87–5.11)
RDW: 13.2 % (ref 11.5–15.5)
WBC: 5.2 10*3/uL (ref 4.0–10.5)

## 2015-02-22 LAB — BASIC METABOLIC PANEL
BUN: 12 mg/dL (ref 6–23)
CALCIUM: 9.4 mg/dL (ref 8.4–10.5)
CO2: 29 mEq/L (ref 19–32)
CREATININE: 0.71 mg/dL (ref 0.40–1.20)
Chloride: 103 mEq/L (ref 96–112)
GFR: 102.03 mL/min (ref >60.00)
Glucose, Bld: 120 mg/dL — ABNORMAL HIGH (ref 70–99)
Potassium: 3.7 mEq/L (ref 3.5–5.1)
Sodium: 137 mEq/L (ref 135–145)

## 2015-02-22 NOTE — Progress Notes (Signed)
Patient ID: [name] [name], female   DOB: 11/15/1933, 81 y.o.   MRN: [id number] PCP: Dr. Illene [name]  81 yo with history of CAD s/p PCI returns for cardiology followup today.  She initially had depressed LV EF after her MI but this rose back to normal range on echo in 2009.  She had an echo done in 3/11 showing preserved LV systolic function and mild diastolic dysfunction.  ETT-myoview was done in 7/15 with no ischemia or infarction.  She had left THR without complication in 8/15.   She is stable symptomatically.  She walks with a cane or rolling walker.  No exertional dyspnea.  No chest pain.  No orthopnea, PND, or tachypalpitations.  BP is controlled.  Weight is down 5 lbs.  No falls or lightheadedness.   Labs (4/11): LDL 61, HDL 42, LFTs normal Labs (4/096/11): K 4.5, creatinine 0.7, BNP 40 Labs (10/11): K 4.1, creatinine 0.7, LDL 69, HDL 41 Labs (4/12): K 4.4, creatinine 0.8, LDL 110, HDL 50 Labs (7/12): K 3.4, creatinine 0.8, BNP 19, LDL 50, HDL 50 Labs (1/13): K 4.1, creatinine 0.72 Labs (4/13): LDL 72, HDL 33, K 3.9, creatinine 0.9 Labs (1/14): K 3.5, creatinine 0.8, LDL 123, HDL 48 Labs (8/15): K 4, creatinine 1.02 Labs (12/15): LDL 54, HDL 52  ECG: Ectopic atrial rhythm with inferior and anterolateral slight ST depression and T wave flattening (similar to priors)  Allergies (verified):  No Known Drug Allergies  Past Medical History: 1. Coronary artery disease.  The patient had an acute anterior MI in March 2008.  She had 90% LAD stenosis with thrombus.  She did have 2.75 x 12 Taxus drug-eluting stent placed in the proximal LAD.  She did also have 80% first obtuse marginal stenosis, 70% distal circumflex stenosis and 50% proximal RCA stenosis.  Her EF was initially 30-35% at the time of her MI; however, it rose after primary PCI to 60%.  ETT-myoview (3/11): EF 68%, hypertensive BP response, normal perfusion at rest and stress (negative study).  Lexiscan Cardiolite (7/15) with EF 64%, no  ischemia or infarction.  2. Hypertension. 3. Hypercholesterolemia. 4. Gastroesophageal reflux disease. 5. Mild carotid stenosis less than 50% bilaterally. Carotid dopplers (4/13) with mild plaque bilaterally.  6. Diastolic CHF: Echo (3/11) with EF 60-65%, mild diastolic dysfunction, mild MR, normal RV.  7. Osteoarthritis: s/p right THR and left THR.   Family History: No premature CAD  Social History: The patient lives alone in an apartment in [name]  She does not smoke or drink alcohol.  ROS:  All systems reviewed and negative except as per HPI.    Current Outpatient Prescriptions  Medication Sig Dispense Refill  . aspirin 81 MG tablet Take 81 mg by mouth daily.    . carvedilol (COREG) 25 MG tablet Take 1 tablet (25 mg total) by mouth 2 (two) times daily.    . clopidogrel (PLAVIX) 75 MG tablet Take 75 mg by mouth daily.    . enalapril (VASOTEC) 20 MG tablet Take 1 tablet (20 mg total) by mouth 2 (two) times daily. 60 tablet 6  . furosemide (LASIX) 40 MG tablet Take 20-40 mg by mouth 2 (two) times daily. Take 1 tablet in the morning Take 0.5 tablet every evening    . hydrochlorothiazide (HYDRODIURIL) 25 MG tablet Take 1 tablet (25 mg total) by mouth daily.    Marland Kitchen. levothyroxine (SYNTHROID, LEVOTHROID) 100 MCG tablet Take 100 mcg by mouth daily.      . nitroGLYCERIN (NITROSTAT)  0.4 MG SL tablet Place 0.4 mg under the tongue every 5 (five) minutes as needed for chest pain.    . potassium chloride SA (K-DUR,KLOR-CON) 20 MEQ tablet Take 20 mEq by mouth daily.    . rosuvastatin (CRESTOR) 40 MG tablet Take 1 tablet (40 mg total) by mouth daily. 30 tablet 6  . spironolactone (ALDACTONE) 25 MG tablet Take 25 mg by mouth daily.    . [DISCONTINUED] warfarin (COUMADIN) 5 MG tablet Take 1 tablet (5 mg total) by mouth daily. 30 tablet 1   No current facility-administered medications for this visit.    BP 132/86 mmHg  Pulse 71  Ht 4\' 10"  (1.473 m)  Wt 141 lb (63.957 kg)  BMI 29.48  kg/m2 General:  Well developed, well nourished, in no acute distress. Neck:  Neck supple, no JVD. No masses, thyromegaly or abnormal cervical nodes. Lungs:  Clear bilaterally to auscultation and percussion. Heart:  Non-displaced PMI, chest non-tender; regular rate and rhythm, S1, S2.  +S4. 1/6 early systolic ejection-type murmur.  Carotid upstroke normal, left bruit. Pedals normal pulses. No edema.   Abdomen:  Bowel sounds positive; abdomen soft and non-tender without masses, organomegaly, or hernias noted. No hepatosplenomegaly. Extremities:  No clubbing or cyanosis. Neurologic:  Alert and oriented x 3. Psych:  Normal affect.  Assessment/Plan:  CAD, NATIVE VESSEL No chest pain, no ischemia on 7/15 Cardiolite. Continue ASA, Plavix, Coreg, ACEI, and statin. CHRONIC DIASTOLIC HEART FAILURE Euvolemic on exam. Stable NYHA class II symptoms. Continue current dose of Lasix.   HYPERLIPIDEMIA Good lipids in 12/15.   HYPERTENSION BP appears to be well-controlled now.  BMET today.  No medication changes.  Ectopic atrial rhythm Noted today, and patient has also had an ectopic atrial rhythm on prior ECGs.  However, HR is within a normal range so I do not think that there is any particular clinical significance to this.  She does not get lightheaded and has had no syncopal/presyncopal episodes.  This has been chronic.    [name] [name] 02/22/2015

## 2015-02-22 NOTE — Patient Instructions (Signed)
Medication Instructions:  No changes today.  Labwork: BMET/CBCd today  Testing/Procedures: None today.  Follow-Up: Your physician wants you to follow-up in: 6 months with Dr McLean. (November 2016).You will receive a reminder letter in the mail two months in advance. If you don't receive a letter, please call our office to schedule the follow-up appointment.      

## 2015-08-21 ENCOUNTER — Ambulatory Visit (INDEPENDENT_AMBULATORY_CARE_PROVIDER_SITE_OTHER): Payer: Medicare Other | Admitting: Nurse Practitioner

## 2015-08-21 ENCOUNTER — Encounter: Payer: Self-pay | Admitting: Nurse Practitioner

## 2015-08-21 VITALS — BP 170/98 | HR 72 | Ht <= 58 in | Wt 146.4 lb

## 2015-08-21 DIAGNOSIS — I5032 Chronic diastolic (congestive) heart failure: Secondary | ICD-10-CM

## 2015-08-21 DIAGNOSIS — I10 Essential (primary) hypertension: Secondary | ICD-10-CM

## 2015-08-21 DIAGNOSIS — E785 Hyperlipidemia, unspecified: Secondary | ICD-10-CM

## 2015-08-21 DIAGNOSIS — I2510 Atherosclerotic heart disease of native coronary artery without angina pectoris: Secondary | ICD-10-CM | POA: Diagnosis not present

## 2015-08-21 LAB — BASIC METABOLIC PANEL
BUN: 10 mg/dL (ref 7–25)
CO2: 25 mmol/L (ref 20–31)
Calcium: 8.9 mg/dL (ref 8.6–10.4)
Chloride: 105 mmol/L (ref 98–110)
Creat: 0.58 mg/dL — ABNORMAL LOW (ref 0.60–0.93)
Glucose, Bld: 102 mg/dL — ABNORMAL HIGH (ref 65–99)
Potassium: 3.8 mmol/L (ref 3.5–5.3)
Sodium: 139 mmol/L (ref 135–146)

## 2015-08-21 MED ORDER — SPIRONOLACTONE 25 MG PO TABS: 25.0000 mg | ORAL_TABLET | Freq: Every day | ORAL | Status: DC

## 2015-08-21 MED ORDER — CARVEDILOL 25 MG PO TABS: 25.0000 mg | ORAL_TABLET | Freq: Two times a day (BID) | ORAL | Status: DC

## 2015-08-21 MED ORDER — CLOPIDOGREL BISULFATE 75 MG PO TABS: 75.0000 mg | ORAL_TABLET | Freq: Every day | ORAL | Status: DC

## 2015-08-21 NOTE — Progress Notes (Signed)
CARDIOLOGY OFFICE NOTE  Date:  08/21/2015    [name] [name] Date of Birth: 02-06-1934 Medical Record #[id number]  PCP:  Arlyss Queen, MD  Cardiologist:  Shirlee Latch    Chief Complaint  Patient presents with  . Coronary Artery Disease    Follow up visit - seen for Dr. Shirlee Latch    History of Present Illness: [name] [name] is a 81 y.o. female who presents today for a follow up visit. Seen for Dr. Shirlee Latch. This is a 6 month check.  She has a history of CAD s/p PCI.  She initially had depressed LV EF after her MI but this rose back to normal range on echo in 2009. She had an echo done in 3/11 showing preserved LV systolic function and mild diastolic dysfunction. ETT-myoview was done in 7/15 with no ischemia or infarction. She had left THR without complication in 8/15.   Last seen back in May. Felt to be doing well. Noted to have ectopic atrial rhythm - no symptoms noted.   Comes back today. Here alone. Doing well. Says she is doing ok. No chest pain. Breathing is good. She is pretty happy with how she is doing. BP is up today. Has not had all of her medicines - seems a little confused about her medicines. Says she misplaced her bottle of Spironolactone. This may be why BP is high. No Coreg since yesterday. Needs Plavix as well.   Past Medical History: 1. Coronary artery disease. The patient had an acute anterior MI in March 2008. She had 90% LAD stenosis with thrombus. She did have 2.75 x 12 Taxus drug-eluting stent placed in the proximal LAD. She did also have 80% first obtuse marginal stenosis, 70% distal circumflex stenosis and 50% proximal RCA stenosis. Her EF was initially 30-35% at the time of her MI; however, it rose after primary PCI to 60%. ETT-myoview (3/11): EF 68%, hypertensive BP response, normal perfusion at rest and stress (negative study). Lexiscan Cardiolite (7/15) with EF 64%, no ischemia or infarction.  2. Hypertension. 3. Hypercholesterolemia. 4.  Gastroesophageal reflux disease. 5. Mild carotid stenosis less than 50% bilaterally. Carotid dopplers (4/13) with mild plaque bilaterally.  6. Diastolic CHF: Echo (3/11) with EF 60-65%, mild diastolic dysfunction, mild MR, normal RV.  7. Osteoarthritis: s/p right THR and left THR.     Past Medical History  Diagnosis Date  . CAD (coronary artery disease)     acute MI in 3/08 - taxus stent  . Hypercholesteremia     takes Crestor daily  . GERD (gastroesophageal reflux disease)   . Carotid stenosis     mild  . HTN (hypertension)     takes Amlodpine and Carvedilol daily  . Dry cough   . Bronchitis     in Dec  . Ankle edema     pt lasix and hctz daily  . Headache(784.0)     occasionally  . Arthritis     right hip  . Myocardial infarction [facility])     pt unsure but thinks about 11yrs ago;is taking Plavix daily   . Constipation     takes OTC stool softener as needed  . Nocturia   . Hypothyroidism     takes Synthroid daily  . Diabetes mellitus     pt doesn't take any meds for it  . Cataract     left;not ready to come off  . Primary osteoarthritis of right hip 10/27/2011  . DJD (degenerative joint disease) of hip  right  . Hx of cardiovascular stress test     Lexiscan Myoview (04/2014):  No ischemia, EF 64%, Low Risk  . Osteoarthritis of left hip 05/09/2014  . Ileus, postoperative 05/12/2014    Past Surgical History  Procedure Laterality Date  . Back surgery  15+yrs ago  . Lymph node dissection  20+yrs ago    right   . Abdominal hysterectomy  20+yrs  . Coronary artery bypass graft    . Cardiac catheterization  2008  . Total hip arthroplasty  10/27/2011    Procedure: TOTAL HIP ARTHROPLASTY;  Surgeon: Eulas PostJoshua P Landau, MD;  Location: [facility] OR;  Service: Orthopedics;  Laterality: Right;  . Total hip arthroplasty Left 05/09/2014    Procedure: LEFT TOTAL HIP ARTHROPLASTY;  Surgeon: Eulas PostJoshua P Landau, MD;  Location: [facility] OR;  Service: Orthopedics;  Laterality: Left;      Medications: Current Outpatient Prescriptions  Medication Sig Dispense Refill  . aspirin 81 MG tablet Take 81 mg by mouth daily.    . clopidogrel (PLAVIX) 75 MG tablet Take 1 tablet (75 mg total) by mouth daily. 90 tablet 3  . enalapril (VASOTEC) 20 MG tablet Take 1 tablet (20 mg total) by mouth 2 (two) times daily. 60 tablet 6  . furosemide (LASIX) 40 MG tablet Take 20-40 mg by mouth 2 (two) times daily. Take 1 tablet in the morning Take 0.5 tablet every evening    . hydrochlorothiazide (HYDRODIURIL) 25 MG tablet Take 1 tablet (25 mg total) by mouth daily.    Marland Kitchen. levothyroxine (SYNTHROID, LEVOTHROID) 100 MCG tablet Take 100 mcg by mouth daily.      . potassium chloride SA (K-DUR,KLOR-CON) 20 MEQ tablet Take 20 mEq by mouth daily.    . rosuvastatin (CRESTOR) 40 MG tablet Take 1 tablet (40 mg total) by mouth daily. 30 tablet 6  . carvedilol (COREG) 25 MG tablet Take 1 tablet (25 mg total) by mouth 2 (two) times daily. 180 tablet 3  . nitroGLYCERIN (NITROSTAT) 0.4 MG SL tablet Place 0.4 mg under the tongue every 5 (five) minutes as needed for chest pain.    Marland Kitchen. spironolactone (ALDACTONE) 25 MG tablet Take 1 tablet (25 mg total) by mouth daily. 90 tablet 3  .   30 tablet 1   No current facility-administered medications for this visit.    Allergies: No Known Allergies  Social History: The patient  reports that she has never smoked. She does not have any smokeless tobacco history on file. She reports that she does not drink alcohol or use illicit drugs.   Family History: The patient's family history includes Diabetes in her brother; Heart attack in her brother and sister; Hyperlipidemia in her sister; Hypertension in her brother, daughter, father, mother, sister, and son; Stroke in her mother. There is no history of Anesthesia problems, Hypotension, Malignant hyperthermia, or Pseudochol deficiency.   Review of Systems: Please see the history of present illness.   Otherwise, the review of  systems is positive for none.   All other systems are reviewed and negative.   Physical Exam: VS:  BP 170/98 mmHg  Pulse 72  Ht 4\' 10"  (1.473 m)  Wt 146 lb 6.4 oz (66.407 kg)  BMI 30.61 kg/m2  SpO2 97% .  BMI Body mass index is 30.61 kg/(m^2).  Wt Readings from Last 3 Encounters:  08/21/15 146 lb 6.4 oz (66.407 kg)  02/22/15 141 lb (63.957 kg)  07/31/14 146 lb 1.9 oz (66.28 kg)    General: Pleasant. Elderly black  female who is alert and in no acute distress.  HEENT: Normal but with missing teeth. Neck: Supple, no JVD, carotid bruits, or masses noted.  Cardiac: Regular rate and rhythm. No murmurs, rubs, or gallops. No edema.  Respiratory:  Lungs are clear to auscultation bilaterally with normal work of breathing.  GI: Soft and nontender.  MS: No deformity or atrophy. Gait and ROM intact. Using a cane today.  Skin: Warm and dry. Color is normal.  Neuro:  Strength and sensation are intact and no gross focal deficits noted.  Psych: Alert, appropriate and with normal affect.   LABORATORY DATA:  EKG:  EKG is not ordered today.  Lab Results  Component Value Date   WBC 5.2 02/22/2015   HGB 12.7 02/22/2015   HCT 37.4 02/22/2015   PLT 230.0 02/22/2015   GLUCOSE 120* 02/22/2015   CHOL 170 07/31/2014   TRIG 96.0 07/31/2014   HDL 45.70 07/31/2014   LDLCALC 105* 07/31/2014   ALT 16 05/01/2011   AST 19 05/01/2011   NA 137 02/22/2015   K 3.7 02/22/2015   CL 103 02/22/2015   CREATININE 0.71 02/22/2015   BUN 12 02/22/2015   CO2 29 02/22/2015   INR 0.99 04/26/2014    BNP (last 3 results) No results for input(s): BNP in the last 8760 hours.  ProBNP (last 3 results) No results for input(s): PROBNP in the last 8760 hours.   Other Studies Reviewed Today:  Impression Exercise Capacity: Lexiscan with no exercise. BP Response: Normal blood pressure response. Clinical Symptoms: Shortness of breath ECG Impression: No significant ST segment change suggestive of  ischemia. Comparison with Prior Nuclear Study: No significant change from previous study  Overall Impression: Normal stress nuclear study. there is no scar or ischemia. This is a low risk scan.  LV Ejection Fraction: 64%. LV Wall Motion: Normal Wall Motion.  Willa Rough, MD    Assessment/Plan:  CAD, NATIVE VESSEL No chest pain, no ischemia on 7/15 Cardiolite. Continue ASA, Plavix, Coreg, ACEI, and statin.  CHRONIC DIASTOLIC HEART FAILURE Euvolemic on exam. Stable NYHA class II symptoms. Continue current dose of Lasix. Needs repeat BMET. Adding back her Aldactone - will check BMET again in one week.   HYPERLIPIDEMIA Good lipids in 12/15.   HYPERTENSION BP elevated today - I suspect she has not had all of her medicines today due to needing refills. Adding back Aldactone as well. BP check in one week.   Ectopic atrial rhythm  This has been chronic.    Current medicines are reviewed with the patient today.  The patient does not have concerns regarding medicines other than what has been noted above.  The following changes have been made:  See above.  Labs/ tests ordered today include:    Orders Placed This Encounter  Procedures  . Basic metabolic panel  . Basic metabolic panel     Disposition:   FU with Dr. Shirlee Latch in 4  months.   Patient is agreeable to this plan and will call if any problems develop in the interim.   Signed: Rosalio Macadamia, RN, ANP-C 08/21/2015 3:10 PM  [facility] [address] [place], [place]  [address] Phone: [phone] Fax: [phone]

## 2015-08-21 NOTE — Patient Instructions (Addendum)
We will be checking the following labs today - BMET   BMET in one week with BP check   Medication Instructions:    Continue with your current medicines. I have refilled the Plavix  I am putting you back on Aldactone    Testing/Procedures To Be Arranged:  N/A  Follow-Up:   See Dr. Shirlee LatchMcLean in 4 months    Other Special Instructions:   N/A    If you need a refill on your cardiac medications before your next appointment, please call your pharmacy.   Call the [facility] office at [phone] if you have any questions, problems or concerns.

## 2015-08-28 ENCOUNTER — Other Ambulatory Visit (INDEPENDENT_AMBULATORY_CARE_PROVIDER_SITE_OTHER): Payer: Medicare Other

## 2015-08-28 ENCOUNTER — Ambulatory Visit (INDEPENDENT_AMBULATORY_CARE_PROVIDER_SITE_OTHER): Payer: Medicare Other

## 2015-08-28 VITALS — BP 120/58 | HR 72 | Resp 18 | Ht <= 58 in | Wt 141.0 lb

## 2015-08-28 DIAGNOSIS — I2510 Atherosclerotic heart disease of native coronary artery without angina pectoris: Secondary | ICD-10-CM | POA: Diagnosis not present

## 2015-08-28 DIAGNOSIS — I5032 Chronic diastolic (congestive) heart failure: Secondary | ICD-10-CM

## 2015-08-28 DIAGNOSIS — I10 Essential (primary) hypertension: Secondary | ICD-10-CM

## 2015-08-28 DIAGNOSIS — E785 Hyperlipidemia, unspecified: Secondary | ICD-10-CM

## 2015-08-28 LAB — BASIC METABOLIC PANEL
BUN: 18 mg/dL (ref 7–25)
CO2: 27 mmol/L (ref 20–31)
Calcium: 9.5 mg/dL (ref 8.6–10.4)
Chloride: 97 mmol/L — ABNORMAL LOW (ref 98–110)
Creat: 1.08 mg/dL — ABNORMAL HIGH (ref 0.60–0.93)
Glucose, Bld: 145 mg/dL — ABNORMAL HIGH (ref 65–99)
Potassium: 4.1 mmol/L (ref 3.5–5.3)
Sodium: 137 mmol/L (ref 135–146)

## 2015-08-28 NOTE — Progress Notes (Signed)
1.) Reason for visit: BP check  2.) Name of MD requesting visit: [name] FredricksonLori Gerhardt NP  3.) H&P: Patient has a history of CAD, HTN, Hypercholesterolemia, GERD, Diastolic CHF, and mild carotid stenosis. Patient's BP at last OV was 170/98 HR 72, and aldactone was added to patient's medication list.   4.) ROS related to problem: Patient denies any chest pain, SOB, or weakness. Patient states she feels fine today. Patient's BP 120/58 and HR 72.   5.) Assessment and plan per MD: Consulted [name] FredricksonLori Gerhardt NP, no changes keep scheduled appointment with Dr. Shirlee LatchMcLean.

## 2015-08-28 NOTE — Patient Instructions (Addendum)
Medication Instructions:  Your physician recommends that you continue on your current medications as directed. Please refer to the Current Medication list given to you today.  Labwork: NONE  Testing/Procedures: NONE  Follow-Up: Your physician recommends that you schedule a follow-up appointment in: 4 months with Dr. Shirlee LatchMcLean

## 2015-12-12 ENCOUNTER — Encounter: Payer: Self-pay | Admitting: Cardiology

## 2015-12-21 ENCOUNTER — Ambulatory Visit: Payer: Medicare Other | Admitting: Cardiology

## 2015-12-25 ENCOUNTER — Encounter: Payer: Self-pay | Admitting: Cardiology

## 2015-12-25 ENCOUNTER — Ambulatory Visit (INDEPENDENT_AMBULATORY_CARE_PROVIDER_SITE_OTHER): Payer: Medicare Other | Admitting: Cardiology

## 2015-12-25 VITALS — BP 140/60 | HR 85 | Ht <= 58 in | Wt 154.0 lb

## 2015-12-25 DIAGNOSIS — E785 Hyperlipidemia, unspecified: Secondary | ICD-10-CM

## 2015-12-25 DIAGNOSIS — I2510 Atherosclerotic heart disease of native coronary artery without angina pectoris: Secondary | ICD-10-CM

## 2015-12-25 DIAGNOSIS — R0609 Other forms of dyspnea: Secondary | ICD-10-CM | POA: Diagnosis not present

## 2015-12-25 LAB — CBC WITH DIFFERENTIAL/PLATELET
BASOS PCT: 0 % (ref 0–1)
Basophils Absolute: 0 10*3/uL (ref 0.0–0.1)
EOS ABS: 0.1 10*3/uL (ref 0.0–0.7)
EOS PCT: 1 % (ref 0–5)
HCT: 39.4 % (ref 36.0–46.0)
Hemoglobin: 13 g/dL (ref 12.0–15.0)
Lymphocytes Relative: 36 % (ref 12–46)
Lymphs Abs: 2.7 10*3/uL (ref 0.7–4.0)
MCH: 30.5 pg (ref 26.0–34.0)
MCHC: 33 g/dL (ref 30.0–36.0)
MCV: 92.5 fL (ref 78.0–100.0)
MONO ABS: 0.8 10*3/uL (ref 0.1–1.0)
MONOS PCT: 10 % (ref 3–12)
MPV: 12.1 fL (ref 8.6–12.4)
NEUTROS ABS: 4 10*3/uL (ref 1.7–7.7)
Neutrophils Relative %: 53 % (ref 43–77)
Platelets: 278 10*3/uL (ref 150–400)
RBC: 4.26 MIL/uL (ref 3.87–5.11)
RDW: 13.7 % (ref 11.5–15.5)
WBC: 7.6 10*3/uL (ref 4.0–10.5)

## 2015-12-25 NOTE — Patient Instructions (Signed)
Medication Instructions:  Your physician recommends that you continue on your current medications as directed. Please refer to the Current Medication list given to you today.   Labwork: Cmet, Bnp, Cbc today  Your physician recommends that you return for a FASTING lipid profile: same day as stress test   Testing/Procedures: Your physician has requested that you have an echocardiogram. Echocardiography is a painless test that uses sound waves to create images of your heart. It provides your doctor with information about the size and shape of your heart and how well your heart's chambers and valves are working. This procedure takes approximately one hour. There are no restrictions for this procedure.   Your physician has requested that you have a lexiscan myoview. For further information please visit [email or web address]. Please follow instruction sheet, as given.    Follow-Up: Your physician recommends that you schedule a follow-up appointment in: 2 weeks with Brittainy simmons,PA   Any Other Special Instructions Will Be Listed Below (If Applicable).     If you need a refill on your cardiac medications before your next appointment, please call your pharmacy.

## 2015-12-25 NOTE — Progress Notes (Signed)
12/25/2015 [name]   04/06/1934  [id number]  Primary Physician Arlyss QueenSELVIDGE,WILLIAM M, MD Primary Cardiologist: Dr. Shirlee LatchMcLean   Reason for Visit/CC: Routine f/u for CAD  HPI:  82 y/o AAF, followed by Dr. Shirlee LatchMcLean, who presents for routine f/u. She has a history of CAD s/p MI in 2008 s/p PCI to the LAD.She did have 2.75 x 12 Taxus drug-eluting stent placed in the proximal LAD. She did also have 80% first obtuse marginal stenosis, 70% distal circumflex stenosis and 50% proximal RCA stenosis. She initially had depressed LV EF after her MI but this rose back to normal range on echo in 2009. She had an echo done in 3/11 showing preserved LV systolic function and mild diastolic dysfunction. ETT-myoview was done in 7/15 with no ischemia or infarction. She had left THR without complication in 8/15. She was last seen by [name] FredricksonLori Gerhardt, NP, on 08/21/15 for routine f/u. The patient was doing well w/o CP. Her BP was a bit elevated (had ran out of her spironolactone). This was refilled and she was instructed to f/u in 1 week for f/u BMP. This showed normal renal function and stable K levels. She was instructed to f/u with Dr. Shirlee LatchMcLean in 4 month. I am evaluating her in clinic today for Dr. Shirlee LatchMcLean.   Her main complaint is the development of DOE over the last 3 weeks. She denies resting dyspnea. No LEE. She notes mild orthopnea but no PND. She also denies CP. She notes that she has been fully compliant with her meds.    Current Outpatient Prescriptions  Medication Sig Dispense Refill  . aspirin 81 MG tablet Take 81 mg by mouth daily.    . carvedilol (COREG) 25 MG tablet Take 1 tablet (25 mg total) by mouth 2 (two) times daily. 180 tablet 3  . clopidogrel (PLAVIX) 75 MG tablet Take 1 tablet (75 mg total) by mouth daily. 90 tablet 3  . enalapril (VASOTEC) 20 MG tablet Take 1 tablet (20 mg total) by mouth 2 (two) times daily. 60 tablet 6  . furosemide (LASIX) 40 MG tablet Take 20-40 mg by mouth 2 (two) times  daily. Take 1 tablet in the morning Take 0.5 tablet every evening    . hydrochlorothiazide (HYDRODIURIL) 25 MG tablet Take 1 tablet (25 mg total) by mouth daily.    Marland Kitchen. levothyroxine (SYNTHROID, LEVOTHROID) 100 MCG tablet Take 100 mcg by mouth daily.      . nitroGLYCERIN (NITROSTAT) 0.4 MG SL tablet Place 0.4 mg under the tongue every 5 (five) minutes as needed for chest pain.    . potassium chloride SA (K-DUR,KLOR-CON) 20 MEQ tablet Take 20 mEq by mouth daily.    . rosuvastatin (CRESTOR) 40 MG tablet Take 1 tablet (40 mg total) by mouth daily. 30 tablet 6  . spironolactone (ALDACTONE) 25 MG tablet Take 1 tablet (25 mg total) by mouth daily. 90 tablet 3  . [DISCONTINUED] warfarin (COUMADIN) 5 MG tablet Take 1 tablet (5 mg total) by mouth daily. 30 tablet 1   No current facility-administered medications for this visit.    No Known Allergies  Social History   Social History  . Marital Status: Widowed    Spouse Name: N/A  . Number of Children: N/A  . Years of Education: N/A   Occupational History  . Not on file.   Social History Main Topics  . Smoking status: Never Smoker   . Smokeless tobacco: Not on file  . Alcohol Use: No  . Drug  Use: No  . Sexual Activity: No   Other Topics Concern  . Not on file   Social History Narrative     Review of Systems: General: negative for chills, fever, night sweats or weight changes.  Cardiovascular: negative for chest pain, dyspnea on exertion, edema, orthopnea, palpitations, paroxysmal nocturnal dyspnea or shortness of breath Dermatological: negative for rash Respiratory: negative for cough or wheezing Urologic: negative for hematuria Abdominal: negative for nausea, vomiting, diarrhea, bright red blood per rectum, melena, or hematemesis Neurologic: negative for visual changes, syncope, or dizziness All other systems reviewed and are otherwise negative except as noted above.    Blood pressure 140/60, pulse 85, height  (1.473 m),  weight 154 lb (69.854 kg).  General appearance: alert, cooperative and no distress Neck: no carotid bruit and no JVD Lungs: clear to auscultation bilaterally Heart: regular rate and rhythm and 2/6 SM at RUSB Extremities: no LEE Pulses: 2+ and symmetric Skin: warm and dry Neurologic: Grossly normal  EKG NSR. Slight ST depressions in inferior leads.   ASSESSMENT AND PLAN:   1. CAD, NATIVE VESSEL No chest pain. Patient does note increased DOE and decreased exercise tolerance x 3 weeks. She has a h/o MI and PCI in the past as outlined above. Continue ASA, Plavix, Coreg, ACEI, and statin. We will plan for a NST to r/o ischemia.   2. CHRONIC DIASTOLIC HEART FAILURE Euvolemic on exam. Patient does note increased DOE and decreased exercise tolerance x 3 weeks. We will check a BNP today, CBC and BMP. Continue current dose of Lasix. Low sodium diet advised. We will advised patient to increase her lasix if BNP suggest acute CHF. We will order a 2D echo to reassess LVF.   3. HYPERLIPIDEMIA Last lipid panel was in 2015. LDL was not at goal. LDL was 105 mg/dL. She reports compliance with Crestor. We will repeat a FLP when she returns for stress test. If not at goal of <70 mg/dL, will consider referral to lipid clinic for PCSK9 inhibitor.   4. HYPERTENSION Controlled on current regimen.   5. Cardiac Murmur: murmur heard a RUSB concerning for Aortic Stenosis. Her last echo in 2011 was negative for AS. We will order a 2D echo to further evaluate.   PLAN  F/u in 2 weeks.   Robbie Lis PA-C 12/25/2015 2:54 PM

## 2015-12-26 ENCOUNTER — Telehealth: Payer: Self-pay | Admitting: Cardiology

## 2015-12-26 LAB — COMPREHENSIVE METABOLIC PANEL
ALBUMIN: 4.2 g/dL (ref 3.6–5.1)
ALK PHOS: 71 U/L (ref 33–130)
ALT: 11 U/L (ref 6–29)
AST: 14 U/L (ref 10–35)
BILIRUBIN TOTAL: 0.4 mg/dL (ref 0.2–1.2)
BUN: 13 mg/dL (ref 7–25)
CO2: 27 mmol/L (ref 20–31)
CREATININE: 0.88 mg/dL (ref 0.60–0.88)
Calcium: 9.7 mg/dL (ref 8.6–10.4)
Chloride: 102 mmol/L (ref 98–110)
Glucose, Bld: 125 mg/dL — ABNORMAL HIGH (ref 65–99)
Potassium: 4.1 mmol/L (ref 3.5–5.3)
SODIUM: 143 mmol/L (ref 135–146)
TOTAL PROTEIN: 8.2 g/dL — AB (ref 6.1–8.1)

## 2015-12-26 LAB — BRAIN NATRIURETIC PEPTIDE: BRAIN NATRIURETIC PEPTIDE: 57.6 pg/mL (ref <100)

## 2015-12-26 NOTE — Telephone Encounter (Signed)
[facility] with [facility] and they found the tube with the name on in and they ran the test.

## 2015-12-26 NOTE — Telephone Encounter (Signed)
Not sure why I got this message will route to [facility] Mack, CMA who is covering the lab today.

## 2015-12-26 NOTE — Telephone Encounter (Signed)
New Message  NO name on lavender top- when there is no name the test can not be cmopleted- may need to call and clarify other lavender top specimen to be used. Please call back and discuss.

## 2016-01-08 ENCOUNTER — Telehealth (HOSPITAL_COMMUNITY): Payer: Self-pay | Admitting: *Deleted

## 2016-01-08 NOTE — Telephone Encounter (Signed)
Patient attempted to contact to inform of 4/6 nuclear appointment. Voice mailbox full and daughter listed to contact,left message on her home # and unable to reach on her cell.[name] [name], [name] IdlerCynthia W

## 2016-01-10 ENCOUNTER — Ambulatory Visit (HOSPITAL_COMMUNITY): Payer: Medicare Other | Attending: Cardiology

## 2016-01-10 ENCOUNTER — Other Ambulatory Visit (INDEPENDENT_AMBULATORY_CARE_PROVIDER_SITE_OTHER): Payer: Medicare Other | Admitting: *Deleted

## 2016-01-10 ENCOUNTER — Ambulatory Visit (HOSPITAL_BASED_OUTPATIENT_CLINIC_OR_DEPARTMENT_OTHER): Payer: Medicare Other

## 2016-01-10 ENCOUNTER — Other Ambulatory Visit: Payer: Self-pay

## 2016-01-10 DIAGNOSIS — R011 Cardiac murmur, unspecified: Secondary | ICD-10-CM | POA: Diagnosis present

## 2016-01-10 DIAGNOSIS — E785 Hyperlipidemia, unspecified: Secondary | ICD-10-CM

## 2016-01-10 DIAGNOSIS — I340 Nonrheumatic mitral (valve) insufficiency: Secondary | ICD-10-CM | POA: Insufficient documentation

## 2016-01-10 DIAGNOSIS — I2510 Atherosclerotic heart disease of native coronary artery without angina pectoris: Secondary | ICD-10-CM

## 2016-01-10 DIAGNOSIS — R0609 Other forms of dyspnea: Secondary | ICD-10-CM | POA: Diagnosis not present

## 2016-01-10 DIAGNOSIS — I119 Hypertensive heart disease without heart failure: Secondary | ICD-10-CM | POA: Insufficient documentation

## 2016-01-10 LAB — LIPID PANEL
CHOL/HDL RATIO: 2.5 ratio (ref <=5.0)
CHOLESTEROL: 124 mg/dL — AB (ref 125–200)
HDL: 49 mg/dL (ref >=46)
LDL Cholesterol: 62 mg/dL (ref <130)
Triglycerides: 65 mg/dL (ref <150)
VLDL: 13 mg/dL (ref <30)

## 2016-01-10 LAB — MYOCARDIAL PERFUSION IMAGING
CHL CUP RESTING HR STRESS: 64 {beats}/min
LV dias vol: 59 mL (ref 46–106)
LV sys vol: 18 mL
NUC STRESS TID: 1.21
Peak HR: 73 {beats}/min
RATE: 0.24
SDS: 2
SRS: 8
SSS: 10

## 2016-01-10 MED ORDER — TECHNETIUM TC 99M SESTAMIBI GENERIC - CARDIOLITE
10.9000 | Freq: Once | INTRAVENOUS | Status: AC | PRN
  Administered 2016-01-10: 10.9 via INTRAVENOUS

## 2016-01-10 MED ORDER — REGADENOSON 0.4 MG/5ML IV SOLN
0.4000 mg | Freq: Once | INTRAVENOUS | Status: AC
  Administered 2016-01-10: 0.4 mg via INTRAVENOUS

## 2016-01-10 MED ORDER — TECHNETIUM TC 99M SESTAMIBI GENERIC - CARDIOLITE
32.7000 | Freq: Once | INTRAVENOUS | Status: AC | PRN
  Administered 2016-01-10: 32.7 via INTRAVENOUS

## 2016-01-11 ENCOUNTER — Ambulatory Visit: Payer: Medicare Other | Admitting: Cardiology

## 2016-02-11 ENCOUNTER — Encounter: Payer: Self-pay | Admitting: Cardiology

## 2016-02-11 ENCOUNTER — Ambulatory Visit (INDEPENDENT_AMBULATORY_CARE_PROVIDER_SITE_OTHER): Payer: Medicare Other | Admitting: Cardiology

## 2016-02-11 VITALS — BP 172/74 | HR 68 | Ht <= 58 in | Wt 150.1 lb

## 2016-02-11 DIAGNOSIS — R0609 Other forms of dyspnea: Secondary | ICD-10-CM

## 2016-02-11 MED ORDER — ISOSORBIDE MONONITRATE ER 30 MG PO TB24: 30.0000 mg | ORAL_TABLET | Freq: Every day | ORAL | Status: DC

## 2016-02-11 NOTE — Progress Notes (Signed)
02/11/2016 [name] [name]   [birth year]  [id number]  Primary Physician Arlyss Queen, MD Primary Cardiologist: Dr. Shirlee Latch   Reason for Visit/CC: DOE  HPI:  82 y/o AAF, followed by Dr. Shirlee Latch, who presents for routine f/u. She has a history of CAD s/p MI in 2008 s/p PCI to the LAD.She did have 2.75 x 12 Taxus drug-eluting stent placed in the proximal LAD. She did also have 80% first obtuse marginal stenosis, 70% distal circumflex stenosis and 50% proximal RCA stenosis. She initially had depressed LV EF after her MI but this rose back to normal range on echo in 2009. She had an echo done in 3/11 showing preserved LV systolic function and mild diastolic dysfunction. ETT-myoview was done in 7/15 with no ischemia or infarction. She had left THR without complication in 8/15. She also has a h/o hypertension. I evaluated her in clinic on 12/25/15 for routine f/u. At that time, she denied CP but noted a 3 week history of exertional dyspnea. No LEE. She did note mild orthopnea but no PND.  I ordered a CBC, BNP, 2D echo and NST. CBC was negative for anemia. BNP was also normal at 79. 2-D echo revealed normal left ventricular systolic function with an estimated ejection fraction of 55- 65%. There were no wall motion abnormalities and no significant valvular abnormalities. Her nuclear stress test did show a small defect present in the mid anteroseptal location. There is also a small defect of mild severity present in the apex location. Per cardiologist's report, small region of mid anteroseptal thinning that improves consistent with samll region of ischemia; cannot exclude shifting soft tissue Mild apical thinning that improves but does not appear significant for ischemia LVEF normal Ovreall low risk scan".   In clinic she continues to note mild exertional dyspnea but no resting dyspnea. She denies chest pain.    Current Outpatient Prescriptions  Medication Sig Dispense Refill  . aspirin 81 MG  tablet Take 81 mg by mouth daily.    . carvedilol (COREG) 25 MG tablet Take 1 tablet (25 mg total) by mouth 2 (two) times daily. 180 tablet 3  . clopidogrel (PLAVIX) 75 MG tablet Take 1 tablet (75 mg total) by mouth daily. 90 tablet 3  . enalapril (VASOTEC) 20 MG tablet Take 1 tablet (20 mg total) by mouth 2 (two) times daily. 60 tablet 6  . furosemide (LASIX) 40 MG tablet Take 20-40 mg by mouth 2 (two) times daily. Take 1 tablet in the morning Take 0.5 tablet every evening    . hydrochlorothiazide (HYDRODIURIL) 25 MG tablet Take 1 tablet (25 mg total) by mouth daily.    Marland Kitchen levothyroxine (SYNTHROID, LEVOTHROID) 100 MCG tablet Take 100 mcg by mouth daily.      . nitroGLYCERIN (NITROSTAT) 0.4 MG SL tablet Place 0.4 mg under the tongue every 5 (five) minutes as needed for chest pain.    . potassium chloride SA (K-DUR,KLOR-CON) 20 MEQ tablet Take 20 mEq by mouth daily.    . rosuvastatin (CRESTOR) 40 MG tablet Take 1 tablet (40 mg total) by mouth daily. 30 tablet 6  . spironolactone (ALDACTONE) 25 MG tablet Take 1 tablet (25 mg total) by mouth daily. 90 tablet 3  . [DISCONTINUED] warfarin (COUMADIN) 5 MG tablet Take 1 tablet (5 mg total) by mouth daily. 30 tablet 1   No current facility-administered medications for this visit.    No Known Allergies  Social History   Social History  . Marital Status: Widowed  Spouse Name: N/A  . Number of Children: N/A  . Years of Education: N/A   Occupational History  . Not on file.   Social History Main Topics  . Smoking status: Never Smoker   . Smokeless tobacco: Not on file  . Alcohol Use: No  . Drug Use: No  . Sexual Activity: No   Other Topics Concern  . Not on file   Social History Narrative     Review of Systems: General: negative for chills, fever, night sweats or weight changes.  Cardiovascular: negative for chest pain, dyspnea on exertion, edema, orthopnea, palpitations, paroxysmal nocturnal dyspnea or shortness of  breath Dermatological: negative for rash Respiratory: negative for cough or wheezing Urologic: negative for hematuria Abdominal: negative for nausea, vomiting, diarrhea, bright red blood per rectum, melena, or hematemesis Neurologic: negative for visual changes, syncope, or dizziness All other systems reviewed and are otherwise negative except as noted above.    Blood pressure 172/74, pulse 68, height 4\' 10"  (1.473 m), weight 150 lb 1.9 oz (68.094 kg).  General appearance: alert, cooperative and no distress, dentition in poor repair Neck: no carotid bruit and no JVD Lungs: clear to auscultation bilaterally Heart: regular rate and rhythm, S1, S2 normal, no murmur, click, rub or gallop Extremities: no LEE Pulses: 2+ and symmetric Skin: warm and dry Neurologic: Grossly normal  EKG not performed  ASSESSMENT AND PLAN:   1. DOE: recent BNP and CBC were both normal. 2D echo with normal LVF, wall motion and valve anatomy. NST showed small defects in the mid anteroseptal and apex location. Findings were consistent with mild ischemia but stress test was overall considered a low risk study. Given continued symptoms, we will attempt to treat medically. She is on the max dose of BB, 25 mg of Coreg BID. She is also on a statin, ASA and Plavix. We will add Imdur, 30 mg daily to help improve coronary blood flow and hopefully symptoms. Will have patient f/u in 1 month to reassess symptoms.   2. HTN: elevated today in clinic, but patient notes that she did not take her BP meds prior to appointment today, to avoid frequent urination (on diuretics). We will add Imdur for h/o CAD, exertional dyspnea and mildly abnormal NST. Patient advised to monitor BP closely over the next week with addition of Imdur. She is to notify us if any hypotension.   3. HLD: well controlled on Crestor. Recent FLP 01/10/16 showed LDL to be at goal at 62 mg/dL. TG 65. HDL excellent for her age at 1749. HFTs normal.   PLAN  F/u in 1  month to reassess symptoms.   Robbie LisSIMMONS, BRITTAINY PA-C 02/11/2016 11:29 AM

## 2016-02-11 NOTE — Patient Instructions (Signed)
Medication Instructions:  1) START Imdur 30mg  once daily  Labwork: None  Testing/Procedures: None  Follow-Up: Your physician recommends that you schedule a follow-up appointment in: 4 weeks with Dr. Shirlee LatchMcLean or an extender (preferably Brittainy if any availability.)   Any Other Special Instructions Will Be Listed Below (If Applicable).   Your physician has requested that you regularly monitor and record your blood pressure readings at home. Please use the same machine at the same time of day to check your readings and record them to bring to your follow-up visit.  Check your blood pressure for one week and let us know if you see your readings getting too low.    If you need a refill on your cardiac medications before your next appointment, please call your pharmacy.

## 2016-03-13 ENCOUNTER — Encounter: Payer: Self-pay | Admitting: Cardiology

## 2016-03-13 ENCOUNTER — Ambulatory Visit (INDEPENDENT_AMBULATORY_CARE_PROVIDER_SITE_OTHER): Payer: Medicare Other | Admitting: Cardiology

## 2016-03-13 VITALS — BP 134/40 | HR 68 | Ht <= 58 in | Wt 153.0 lb

## 2016-03-13 DIAGNOSIS — I10 Essential (primary) hypertension: Secondary | ICD-10-CM

## 2016-03-13 NOTE — Progress Notes (Signed)
03/13/2016 [name] [name]   12/08/1933  [id number]  Primary Physician Arlyss QueenSELVIDGE,WILLIAM M, MD Primary Cardiologist: Dr. Gaynelle AduMcCain   Reason for Visit/CC: F/u for Exertional Dyspnea and HTN  HPI:  82 y/o AAF, followed by Dr. Shirlee LatchMcLean, who presents for routine f/u. She has a history of CAD s/p MI in 2008 s/p PCI to the LAD.She did have 2.75 x 12 Taxus drug-eluting stent placed in the proximal LAD. She did also have 80% first obtuse marginal stenosis, 70% distal circumflex stenosis and 50% proximal RCA stenosis. She initially had depressed LV EF after her MI but this rose back to normal range on echo in 2009. She had an echo done in 3/11 showing preserved LV systolic function and mild diastolic dysfunction. ETT-myoview was done in 7/15 with no ischemia or infarction. She had left THR without complication in 8/15. She also has a h/o hypertension. I evaluated her in clinic on 12/25/15 for routine f/u. At that time, she denied CP but noted a 3 week history of exertional dyspnea. No LEE. She did note mild orthopnea but no PND.  I ordered a CBC, BNP, 2D echo and NST. CBC was negative for anemia. BNP was also normal at 79. 2-D echo revealed normal left ventricular systolic function with an estimated ejection fraction of 55- 65%. There were no wall motion abnormalities and no significant valvular abnormalities. Her nuclear stress test did show a small defect present in the mid anteroseptal location. There is also a small defect of mild severity present in the apex location. Per cardiologist's report, small region of mid anteroseptal thinning that improves consistent with samll region of ischemia; cannot exclude shifting soft tissue Mild apical thinning that improves but does not appear significant for ischemia LVEF normal Ovreall low risk scan".   I saw her back in f/u on 02/11/16 and she continued to complain of mild exertional dyspnea. BP was also elevated in the 170s systolic. I opted to add Imdur to her  medical regimen to help improve coronary blood flow and symptoms, as well as for additional BP control. She was started on 30 mg daily. She now presents back for 4 week f/u.   She reports that she has done well with recent change to medications. Her exertional dyspnea has resolved. No CP. She has tolerated Imdur well w/o side effects. BP is also improved at 134/40 today. No dizziness.    Current Outpatient Prescriptions  Medication Sig Dispense Refill  . aspirin 81 MG tablet Take 81 mg by mouth daily.    . carvedilol (COREG) 25 MG tablet Take 1 tablet (25 mg total) by mouth 2 (two) times daily. 180 tablet 3  . clopidogrel (PLAVIX) 75 MG tablet Take 1 tablet (75 mg total) by mouth daily. 90 tablet 3  . enalapril (VASOTEC) 20 MG tablet Take 1 tablet (20 mg total) by mouth 2 (two) times daily. 60 tablet 6  . furosemide (LASIX) 40 MG tablet Take 20-40 mg by mouth 2 (two) times daily. Take 1 tablet in the morning Take 0.5 tablet every evening    . hydrochlorothiazide (HYDRODIURIL) 25 MG tablet Take 1 tablet (25 mg total) by mouth daily.    . isosorbide mononitrate (IMDUR) 30 MG 24 hr tablet Take 1 tablet (30 mg total) by mouth daily. 90 tablet 3  . levothyroxine (SYNTHROID, LEVOTHROID) 100 MCG tablet Take 100 mcg by mouth daily.      . nitroGLYCERIN (NITROSTAT) 0.4 MG SL tablet Place 0.4 mg under the tongue every 5 (  five) minutes as needed for chest pain.    . potassium chloride SA (K-DUR,KLOR-CON) 20 MEQ tablet Take 20 mEq by mouth daily.    . rosuvastatin (CRESTOR) 40 MG tablet Take 1 tablet (40 mg total) by mouth daily. 30 tablet 6  . spironolactone (ALDACTONE) 25 MG tablet Take 1 tablet (25 mg total) by mouth daily. 90 tablet 3  . [DISCONTINUED] warfarin (COUMADIN) 5 MG tablet Take 1 tablet (5 mg total) by mouth daily. 30 tablet 1   No current facility-administered medications for this visit.    No Known Allergies  Social History   Social History  . Marital Status: Widowed    Spouse  Name: N/A  . Number of Children: N/A  . Years of Education: N/A   Occupational History  . Not on file.   Social History Main Topics  . Smoking status: Never Smoker   . Smokeless tobacco: Not on file  . Alcohol Use: No  . Drug Use: No  . Sexual Activity: No   Other Topics Concern  . Not on file   Social History Narrative     Review of Systems: General: negative for chills, fever, night sweats or weight changes.  Cardiovascular: negative for chest pain, dyspnea on exertion, edema, orthopnea, palpitations, paroxysmal nocturnal dyspnea or shortness of breath Dermatological: negative for rash Respiratory: negative for cough or wheezing Urologic: negative for hematuria Abdominal: negative for nausea, vomiting, diarrhea, bright red blood per rectum, melena, or hematemesis Neurologic: negative for visual changes, syncope, or dizziness All other systems reviewed and are otherwise negative except as noted above.    Blood pressure 134/40, pulse 68, height  (1.346 m), weight 153 lb (69.4 kg).  General appearance: alert, cooperative and no distress, dentition in poor repair Neck: no carotid bruit and no JVD Lungs: clear to auscultation bilaterally Heart: regular rate and rhythm, 2/6 SM best heard at the RSB Extremities: no LEE Pulses: 2+ and symmetric Skin: warm and dry Neurologic: Grossly normal  EKG not performed  ASSESSMENT AND PLAN:   1. DOE: recent BNP and CBC were both normal. 2D echo with normal LVF, wall motion and valve anatomy. NST showed small defects in the mid anteroseptal and apex location. Findings were consistent with mild ischemia but stress test was overall considered a low risk study. Medical therapy was elected. She was continued on the max dose of BB, 25 mg of Coreg BID and Imdur 30 mg was added. She has had significant improvement in symptoms with addition of Imdur. No further exertional dyspnea. She is also on a statin, ASA and Plavix. Continue medical  therapy.   2. HTN: Better controled with the addition of Imdur + lasix and Coreg.   3. HLD: well controlled on Crestor. Recent FLP 01/10/16 showed LDL to be at goal at 62 mg/dL. TG 65. HDL excellent for her age at 47. HFTs normal.  4. Cardiac Murmur: recent 2D echo 01/2015 showed trivial MR but no other significant valve abnormalities.   PLAN  F/u with Dr. Shirlee Latch in 6 months.   Isacc Turney PA-C 03/13/2016 9:21 AM

## 2016-03-13 NOTE — Patient Instructions (Signed)
Your physician wants you to follow-up in: 6 months or sooner with Dr Shirlee LatchMclean. You will receive a reminder letter in the mail two months in advance. If you don't receive a letter, please call our office to schedule the follow-up appointment.   If you need a refill on your cardiac medications before your next appointment, please call your pharmacy.

## 2016-05-12 ENCOUNTER — Other Ambulatory Visit: Payer: Self-pay | Admitting: Nurse Practitioner

## 2016-05-12 DIAGNOSIS — E785 Hyperlipidemia, unspecified: Secondary | ICD-10-CM

## 2016-05-12 DIAGNOSIS — I5032 Chronic diastolic (congestive) heart failure: Secondary | ICD-10-CM

## 2016-05-16 ENCOUNTER — Encounter (HOSPITAL_COMMUNITY): Payer: Self-pay | Admitting: Emergency Medicine

## 2016-05-16 ENCOUNTER — Emergency Department (HOSPITAL_COMMUNITY): Payer: Medicare Other

## 2016-05-16 ENCOUNTER — Emergency Department (HOSPITAL_COMMUNITY)
Admission: EM | Admit: 2016-05-16 | Discharge: 2016-05-16 | Disposition: A | Discharge status: Home / Self Care | Payer: Medicare Other | Attending: Emergency Medicine | Admitting: Emergency Medicine

## 2016-05-16 DIAGNOSIS — I10 Essential (primary) hypertension: Secondary | ICD-10-CM | POA: Diagnosis not present

## 2016-05-16 DIAGNOSIS — E119 Type 2 diabetes mellitus without complications: Secondary | ICD-10-CM | POA: Diagnosis not present

## 2016-05-16 DIAGNOSIS — R531 Weakness: Secondary | ICD-10-CM

## 2016-05-16 DIAGNOSIS — E039 Hypothyroidism, unspecified: Secondary | ICD-10-CM | POA: Diagnosis not present

## 2016-05-16 DIAGNOSIS — I2510 Atherosclerotic heart disease of native coronary artery without angina pectoris: Secondary | ICD-10-CM | POA: Insufficient documentation

## 2016-05-16 DIAGNOSIS — Z7982 Long term (current) use of aspirin: Secondary | ICD-10-CM | POA: Insufficient documentation

## 2016-05-16 DIAGNOSIS — Z79899 Other long term (current) drug therapy: Secondary | ICD-10-CM | POA: Diagnosis not present

## 2016-05-16 DIAGNOSIS — M6281 Muscle weakness (generalized): Secondary | ICD-10-CM | POA: Diagnosis present

## 2016-05-16 DIAGNOSIS — R001 Bradycardia, unspecified: Secondary | ICD-10-CM | POA: Diagnosis not present

## 2016-05-16 LAB — URINALYSIS, ROUTINE W REFLEX MICROSCOPIC
Bilirubin Urine: NEGATIVE
Glucose, UA: NEGATIVE mg/dL
KETONES UR: NEGATIVE mg/dL
NITRITE: NEGATIVE
PH: 6 (ref 5.0–8.0)
PROTEIN: NEGATIVE mg/dL
Specific Gravity, Urine: 1.02 (ref 1.005–1.030)

## 2016-05-16 LAB — BASIC METABOLIC PANEL
ANION GAP: 9 (ref 5–15)
BUN: 30 mg/dL — AB (ref 6–20)
CHLORIDE: 103 mmol/L (ref 101–111)
CO2: 25 mmol/L (ref 22–32)
Calcium: 8.6 mg/dL — ABNORMAL LOW (ref 8.9–10.3)
Creatinine, Ser: 1.06 mg/dL — ABNORMAL HIGH (ref 0.44–1.00)
GFR, EST AFRICAN AMERICAN: 56 mL/min — AB (ref >60)
GFR, EST NON AFRICAN AMERICAN: 48 mL/min — AB (ref >60)
Glucose, Bld: 109 mg/dL — ABNORMAL HIGH (ref 65–99)
POTASSIUM: 3.7 mmol/L (ref 3.5–5.1)
SODIUM: 137 mmol/L (ref 135–145)

## 2016-05-16 LAB — URINE MICROSCOPIC-ADD ON

## 2016-05-16 LAB — HEPATIC FUNCTION PANEL
ALBUMIN: 4 g/dL (ref 3.5–5.0)
ALK PHOS: 59 U/L (ref 38–126)
ALT: 15 U/L (ref 14–54)
AST: 18 U/L (ref 15–41)
Bilirubin, Direct: 0.1 mg/dL (ref 0.1–0.5)
Indirect Bilirubin: 0.2 mg/dL — ABNORMAL LOW (ref 0.3–0.9)
TOTAL PROTEIN: 7.6 g/dL (ref 6.5–8.1)
Total Bilirubin: 0.3 mg/dL (ref 0.3–1.2)

## 2016-05-16 LAB — CBC
HEMATOCRIT: 37 % (ref 36.0–46.0)
HEMOGLOBIN: 12.3 g/dL (ref 12.0–15.0)
MCH: 31.1 pg (ref 26.0–34.0)
MCHC: 33.2 g/dL (ref 30.0–36.0)
MCV: 93.4 fL (ref 78.0–100.0)
Platelets: 230 10*3/uL (ref 150–400)
RBC: 3.96 MIL/uL (ref 3.87–5.11)
RDW: 12.9 % (ref 11.5–15.5)
WBC: 6.5 10*3/uL (ref 4.0–10.5)

## 2016-05-16 LAB — CBG MONITORING, ED: Glucose-Capillary: 97 mg/dL (ref 65–99)

## 2016-05-16 LAB — LIPASE, BLOOD: LIPASE: 31 U/L (ref 11–51)

## 2016-05-16 LAB — TROPONIN I

## 2016-05-16 MED ORDER — SODIUM CHLORIDE 0.9 % IV BOLUS (SEPSIS)
500.0000 mL | Freq: Once | INTRAVENOUS | Status: DC
Start: 2016-05-16 — End: 2016-05-17

## 2016-05-16 NOTE — ED Notes (Signed)
EKG given to Dr. Denton Lanksteinl

## 2016-05-16 NOTE — ED Provider Notes (Signed)
[facility] DEPT Provider Note   CSN: [id number] Arrival date & time: 05/16/16  1648  First Provider Contact:  First MD Initiated Contact with Patient 05/16/16 1912        History   Chief Complaint Chief Complaint  Patient presents with  . Weakness    HPI [name] [name] is a 82 y.o. female.  Patient c/o feeling generally weak for the past 2 weeks. Patient states symptoms come and go, last variable duration, and not associated w consistent, specific, exacerbating or alleviating factors. Occasionally light headed/dizzy when stands. No syncope. +decreased appetite. No vomiting or diarrhea. Noted mild mid abd discomfort earlier, no current pain. +recent non productive cough. No sore throat. No chest pain or discomfort. No sob. No recent change in meds. No headache. No focal or unilateral numbness or weakness. No change in speech or vision. No dysuria or gu c/o.       The history is provided by the patient and a relative.  Weakness  Pertinent negatives include no chest pain, no headaches and no shortness of breath.    Past Medical History:  Diagnosis Date  . Ankle edema    pt lasix and hctz daily  . Arthritis    right hip  . Bronchitis    in Dec  . CAD (coronary artery disease)    acute MI in 3/08 - taxus stent  . Carotid stenosis    mild  . Cataract    left;not ready to come off  . Constipation    takes OTC stool softener as needed  . Diabetes mellitus    pt doesn't take any meds for it  . DJD (degenerative joint disease) of hip    right  . Dry cough   . GERD (gastroesophageal reflux disease)   . Headache(784.0)    occasionally  . HTN (hypertension)    takes Amlodpine and Carvedilol daily  . Hx of cardiovascular stress test    Lexiscan Myoview (04/2014):  No ischemia, EF 64%, Low Risk  . Hypercholesteremia    takes Crestor daily  . Hypothyroidism    takes Synthroid daily  . Ileus, postoperative 05/12/2014  . Myocardial infarction [facility])    pt unsure but  thinks about 29yrs ago;is taking Plavix daily   . Nocturia   . Osteoarthritis of left hip 05/09/2014  . Primary osteoarthritis of right hip 10/27/2011    Patient Active Problem List   Diagnosis Date Noted  . Ileus, postoperative 05/12/2014  . Osteoarthritis of left hip 05/09/2014  . Hip arthritis 05/09/2014  . Primary osteoarthritis of right hip 10/27/2011  . Hip pain 08/24/2011  . CHRONIC DIASTOLIC HEART FAILURE 12/24/2009  . SHORTNESS OF BREATH 11/29/2009  . Hyperlipidemia 09/17/2008  . Essential hypertension 09/17/2008  . CAD, NATIVE VESSEL 09/17/2008  . CAROTID ARTERY STENOSIS 09/17/2008    Past Surgical History:  Procedure Laterality Date  . ABDOMINAL HYSTERECTOMY  20+yrs  . BACK SURGERY  15+yrs ago  . CARDIAC CATHETERIZATION  2008  . CORONARY ARTERY BYPASS GRAFT    . LYMPH NODE DISSECTION  20+yrs ago   right   . TOTAL HIP ARTHROPLASTY  10/27/2011   Procedure: TOTAL HIP ARTHROPLASTY;  Surgeon: Eulas Post, MD;  Location: [facility] OR;  Service: Orthopedics;  Laterality: Right;  . TOTAL HIP ARTHROPLASTY Left 05/09/2014   Procedure: LEFT TOTAL HIP ARTHROPLASTY;  Surgeon: Eulas Post, MD;  Location: [facility] OR;  Service: Orthopedics;  Laterality: Left;    OB History    No  data available       Home Medications    Prior to Admission medications   Medication Sig Start Date End Date Taking? Authorizing Provider  aspirin 81 MG tablet Take 81 mg by mouth daily.    Historical Provider, MD  carvedilol (COREG) 25 MG tablet Take 1 tablet (25 mg total) by mouth 2 (two) times daily. 08/21/15   Rosalio MacadamiaLori C Gerhardt, NP  clopidogrel (PLAVIX) 75 MG tablet Take 1 tablet (75 mg total) by mouth daily. 08/21/15   Rosalio MacadamiaLori C Gerhardt, NP  enalapril (VASOTEC) 20 MG tablet Take 1 tablet (20 mg total) by mouth 2 (two) times daily. 07/31/14   Laurey Moralealton S McLean, MD  furosemide (LASIX) 40 MG tablet Take 20-40 mg by mouth 2 (two) times daily. Take 1 tablet in the morning Take 0.5 tablet every evening     Historical Provider, MD  hydrochlorothiazide (HYDRODIURIL) 25 MG tablet Take 1 tablet (25 mg total) by mouth daily. 07/31/14   Laurey Moralealton S McLean, MD  isosorbide mononitrate (IMDUR) 30 MG 24 hr tablet Take 1 tablet (30 mg total) by mouth daily. 02/11/16   Brittainy Sherlynn CarbonM Simmons, PA-C  levothyroxine (SYNTHROID, LEVOTHROID) 100 MCG tablet Take 100 mcg by mouth daily.      Historical Provider, MD  nitroGLYCERIN (NITROSTAT) 0.4 MG SL tablet Place 0.4 mg under the tongue every 5 (five) minutes as needed for chest pain.    Historical Provider, MD  potassium chloride SA (K-DUR,KLOR-CON) 20 MEQ tablet Take 20 mEq by mouth daily.    Historical Provider, MD  rosuvastatin (CRESTOR) 40 MG tablet Take 1 tablet (40 mg total) by mouth daily. 10/19/12   Laurey Moralealton S McLean, MD  spironolactone (ALDACTONE) 25 MG tablet Take 1 tablet (25 mg total) by mouth daily. 08/21/15   Rosalio MacadamiaLori C Gerhardt, NP    Family History Family History  Problem Relation Age of Onset  . Hypertension Mother   . Stroke Mother   . Hypertension Father   . Heart attack Brother   . Heart attack Sister   . Hypertension Sister   . Hypertension Brother   . Hypertension Daughter   . Hypertension Son   . Hyperlipidemia Sister   . Diabetes Brother   . Anesthesia problems Neg Hx   . Hypotension Neg Hx   . Malignant hyperthermia Neg Hx   . Pseudochol deficiency Neg Hx     Social History Social History  Substance Use Topics  . Smoking status: Never Smoker  . Smokeless tobacco: Not on file  . Alcohol use No     Allergies   Review of patient's allergies indicates no known allergies.   Review of Systems Review of Systems  Constitutional: Negative for chills and fever.  HENT: Negative for sore throat.   Eyes: Negative for visual disturbance.  Respiratory: Positive for cough. Negative for shortness of breath.   Cardiovascular: Negative for chest pain and leg swelling.  Gastrointestinal: Negative for vomiting.  Genitourinary: Negative for flank  pain.  Musculoskeletal: Negative for back pain and neck pain.  Skin: Negative for rash.  Neurological: Positive for weakness. Negative for syncope, numbness and headaches.  Hematological: Does not bruise/bleed easily.  Psychiatric/Behavioral: Negative for confusion.     Physical Exam Updated Vital Signs BP 127/60   Pulse (!) 44   Temp 98 F (36.7 C) (Oral)   Resp 18   Ht 4\' 5"  (1.346 m)   Wt 65.8 kg   SpO2 98%   BMI 36.29 kg/m   Physical Exam  Constitutional: She appears well-developed and well-nourished. No distress.  HENT:  Mouth/Throat: Oropharynx is clear and moist.  Eyes: Conjunctivae are normal. Pupils are equal, round, and reactive to light. No scleral icterus.  Neck: Neck supple. No tracheal deviation present.  No bruits  Cardiovascular: Regular rhythm, normal heart sounds and intact distal pulses.  Exam reveals no gallop and no friction rub.   No murmur heard. Bradycardic.   Pulmonary/Chest: Effort normal and breath sounds normal. No respiratory distress.  Abdominal: Soft. Normal appearance and bowel sounds are normal. She exhibits no distension and no mass. There is no tenderness. There is no rebound and no guarding. No hernia.  Genitourinary:  Genitourinary Comments: No cva tenderness  Musculoskeletal: She exhibits no edema.  Neurological: She is alert. No cranial nerve deficit.  Speech clear/fluent. Motor intact bil, stre 5/5. Ambulates w steady gait.   Skin: Skin is warm and dry. Capillary refill takes less than 2 seconds. No rash noted. She is not diaphoretic.  Psychiatric: She has a normal mood and affect.  Nursing note and vitals reviewed.    ED Treatments / Results  Labs (all labs ordered are listed, but only abnormal results are displayed) Results for orders placed or performed during the hospital encounter of 05/16/16  Basic metabolic panel  Result Value Ref Range   Sodium 137 135 - 145 mmol/L   Potassium 3.7 3.5 - 5.1 mmol/L   Chloride 103 101  - 111 mmol/L   CO2 25 22 - 32 mmol/L   Glucose, Bld 109 (H) 65 - 99 mg/dL   BUN 30 (H) 6 - 20 mg/dL   Creatinine, Ser 1.61 (H) 0.44 - 1.00 mg/dL   Calcium 8.6 (L) 8.9 - 10.3 mg/dL   GFR calc non Af Amer 48 (L) >60 mL/min   GFR calc Af Amer 56 (L) >60 mL/min   Anion gap 9 5 - 15  CBC  Result Value Ref Range   WBC 6.5 4.0 - 10.5 K/uL   RBC 3.96 3.87 - 5.11 MIL/uL   Hemoglobin 12.3 12.0 - 15.0 g/dL   HCT 09.6 04.5 - 40.9 %   MCV 93.4 78.0 - 100.0 fL   MCH 31.1 26.0 - 34.0 pg   MCHC 33.2 30.0 - 36.0 g/dL   RDW 81.1 91.4 - 78.2 %   Platelets 230 150 - 400 K/uL  Urinalysis, Routine w reflex microscopic  Result Value Ref Range   Color, Urine YELLOW YELLOW   APPearance CLEAR CLEAR   Specific Gravity, Urine 1.020 1.005 - 1.030   pH 6.0 5.0 - 8.0   Glucose, UA NEGATIVE NEGATIVE mg/dL   Hgb urine dipstick TRACE (A) NEGATIVE   Bilirubin Urine NEGATIVE NEGATIVE   Ketones, ur NEGATIVE NEGATIVE mg/dL   Protein, ur NEGATIVE NEGATIVE mg/dL   Nitrite NEGATIVE NEGATIVE   Leukocytes, UA SMALL (A) NEGATIVE  Hepatic function panel  Result Value Ref Range   Total Protein 7.6 6.5 - 8.1 g/dL   Albumin 4.0 3.5 - 5.0 g/dL   AST 18 15 - 41 U/L   ALT 15 14 - 54 U/L   Alkaline Phosphatase 59 38 - 126 U/L   Total Bilirubin 0.3 0.3 - 1.2 mg/dL   Bilirubin, Direct 0.1 0.1 - 0.5 mg/dL   Indirect Bilirubin 0.2 (L) 0.3 - 0.9 mg/dL  Troponin I  Result Value Ref Range   Troponin I <0.03 <0.03 ng/mL  Lipase, blood  Result Value Ref Range   Lipase 31 11 - 51 U/L  Urine microscopic-add on  Result Value Ref Range   Squamous Epithelial / LPF 6-30 (A) NONE SEEN   WBC, UA 0-5 0 - 5 WBC/hpf   RBC / HPF 6-30 0 - 5 RBC/hpf   Bacteria, UA RARE (A) NONE SEEN  CBG monitoring, ED  Result Value Ref Range   Glucose-Capillary 97 65 - 99 mg/dL   Dg Chest 2 View  Result Date: 05/16/2016 CLINICAL DATA:  Cough and weakness for 1 week. EXAM: CHEST  2 VIEW COMPARISON:  04/26/2014 FINDINGS: Cardiac silhouette is  normal in size and configuration. Normal mediastinal and hilar contours. Minor scarring or subsegmental atelectasis in the lung bases, unchanged from the prior study. Lungs otherwise clear. No pleural effusion or pneumothorax. Skeletal structures are intact. IMPRESSION: No active cardiopulmonary disease. Electronically Signed   By: Amie Portland M.D.   On: 05/16/2016 19:32    EKG  EKG Interpretation  Date/Time:  Friday May 16 2016 16:59:20 EDT Ventricular Rate:  49 PR Interval:  150 QRS Duration: 90 QT Interval:  436 QTC Calculation: 393 R Axis:   59 Text Interpretation:  Unusual P axis, possible ectopic atrial bradycardia ST & T wave abnormality, consider anterolateral ischemia Abnormal ECG Confirmed by Denton Lank  MD, Caryn Bee ([id number]) on 05/16/2016 6:51:17 PM       Radiology No results found.  Procedures Procedures (including critical care time)  Medications Ordered in ED Medications - No data to display   Initial Impression / Assessment and Plan / ED Course  I have reviewed the triage vital signs and the nursing notes.  Pertinent labs & imaging results that were available during my care of the patient were reviewed by me and considered in my medical decision making (see chart for details).  Clinical Course    Iv ns. Labs.   Patient is noted be bradycardic, hr as low as mid to low 40's, sinus.  Is noted to be on b blocker tx.   Reviewed nursing notes and prior charts for additional history.   Recheck patient, hr as low as 42, remains sinus.   abd soft nt. Afeb. Tolerating po fluids.  Discussed w pt, that symptoms may be related to bradycardia, possibly related to carvidilol rx - will hold med, f/u pcp this Monday for recheck.  bp remains stable, no syncope, pt ambulatory in ED, hr 50s, and currently appears stable for d/c.     Final Clinical Impressions(s) / ED Diagnoses   Final diagnoses:  None    New Prescriptions New Prescriptions   No medications on file       Cathren Laine, MD 05/16/16 2054

## 2016-05-16 NOTE — ED Notes (Signed)
MD at the bedside  

## 2016-05-16 NOTE — ED Triage Notes (Signed)
Pt reports generalized weakness, loss of appetite since Monday. Pt also reports mid-abdominal pain x1.5 weeks, pt threw up one time, denies diarrhea or urinary symptoms.  Pt also reports runny nose.

## 2016-05-16 NOTE — ED Notes (Signed)
Patient ambulatory to restroom with steady gait, clean catch instructions given and advised pt to bring specimen back to room as well.  

## 2016-05-16 NOTE — ED Notes (Signed)
Pt complains weakness this week, worse today, decreased appetite, stomach feeling upset.

## 2016-05-16 NOTE — Discharge Instructions (Signed)
It was our pleasure to provide your ER care today - we hope that you feel better.  Rest. Drink plenty of fluids.  Your heart rate is too slow, which may be causing your symptoms hold/stop taking your carvedilol medication for now.  Follow up with your doctor/cardiologist this Monday for recheck.   If feeling faint/dizzy, sit or lie down right away, to avoid risk of passing out, falling, or injuring yourself.  Return to ER right away if worse, new symptoms, fainting, fevers, chest pain, trouble breathing, other concern.

## 2016-05-19 ENCOUNTER — Telehealth: Payer: Self-pay | Admitting: Cardiology

## 2016-05-19 NOTE — Telephone Encounter (Signed)
New message     Pt would like the nurse to give her a call. Pt would not give any details. Please call.

## 2016-05-19 NOTE — Telephone Encounter (Signed)
Pt states BP 165/72, HR 61 earlier today. Pt advised to continue to continue to hold coreg, she will check  BP and HR daily. Pt states she will call in 2-3 days with BP and HR readings. Pt advised I will forward to Dr Shirlee LatchMcLean for review.

## 2016-05-19 NOTE — Telephone Encounter (Signed)
Pt states she went to ED 05/16/16 because she was dizzy and weak. Pt states HR was in the 40s at ED, she was advised at ED to hold coreg. Pt states she has not taken any coreg since Friday.   Pt states weakness and dizziness is better, she is not able to check her HR and BP. Pt is going to try find a place she can check her HR and BP and call me back today.  Pt advised I will forward to Dr Shirlee LatchMcLean for review.

## 2016-05-19 NOTE — Telephone Encounter (Signed)
Need to get BP check and HR check daily for 4-5 days if possible and call in results.

## 2016-05-27 NOTE — Telephone Encounter (Signed)
I attempted to contact pt at [phone] to get BP and HR readings, mailbox full, unable to leave a message.

## 2016-05-29 NOTE — Telephone Encounter (Signed)
I attempted to contact pt again, mailbox full, unable to leave a message.

## 2016-06-02 NOTE — Telephone Encounter (Signed)
Pt states she has checked her heart rate and BP a couple of times since stopping coreg. Pt states she thinks they were both okay. Pt states she will check HR and BP today and let me know the readings.

## 2016-06-16 NOTE — Telephone Encounter (Signed)
Pt states BP last Wednesday was 157/55, heart rate 63. Pt states she is feeling much better off coreg.   I will forward to Dr Shirlee LatchMcLean for review.

## 2016-06-17 NOTE — Telephone Encounter (Signed)
Stay off Coreg, we can review her BP meds at next appointment.

## 2016-06-20 NOTE — Telephone Encounter (Signed)
Mailbox full, unable to leave a message. 

## 2016-06-23 NOTE — Telephone Encounter (Signed)
Pt aware Dr Shirlee LatchMcLean advised to stay off coreg.

## 2016-08-26 ENCOUNTER — Other Ambulatory Visit: Payer: Self-pay | Admitting: Nurse Practitioner

## 2016-08-26 DIAGNOSIS — I5032 Chronic diastolic (congestive) heart failure: Secondary | ICD-10-CM

## 2016-08-26 DIAGNOSIS — E785 Hyperlipidemia, unspecified: Secondary | ICD-10-CM

## 2016-11-13 ENCOUNTER — Telehealth: Payer: Self-pay | Admitting: Cardiology

## 2016-11-13 NOTE — Telephone Encounter (Signed)
Did not need this encounter °

## 2016-11-17 ENCOUNTER — Encounter: Payer: Self-pay | Admitting: Cardiology

## 2016-11-25 ENCOUNTER — Encounter: Payer: Self-pay | Admitting: Cardiology

## 2016-11-25 ENCOUNTER — Ambulatory Visit (INDEPENDENT_AMBULATORY_CARE_PROVIDER_SITE_OTHER): Payer: Medicare Other | Admitting: Cardiology

## 2016-11-25 VITALS — BP 128/66 | HR 79 | Ht <= 58 in | Wt 148.8 lb

## 2016-11-25 DIAGNOSIS — Z79899 Other long term (current) drug therapy: Secondary | ICD-10-CM

## 2016-11-25 DIAGNOSIS — E785 Hyperlipidemia, unspecified: Secondary | ICD-10-CM | POA: Diagnosis not present

## 2016-11-25 LAB — COMPREHENSIVE METABOLIC PANEL
ALK PHOS: 73 IU/L (ref 39–117)
ALT: 12 IU/L (ref 0–32)
AST: 16 IU/L (ref 0–40)
Albumin/Globulin Ratio: 1.2 (ref 1.2–2.2)
Albumin: 4.1 g/dL (ref 3.5–4.7)
BUN/Creatinine Ratio: 18 (ref 12–28)
BUN: 15 mg/dL (ref 8–27)
Bilirubin Total: 0.2 mg/dL (ref 0.0–1.2)
CALCIUM: 9.7 mg/dL (ref 8.7–10.3)
CO2: 25 mmol/L (ref 18–29)
CREATININE: 0.82 mg/dL (ref 0.57–1.00)
Chloride: 97 mmol/L (ref 96–106)
GFR calc Af Amer: 78 (ref >59)
GFR, EST NON AFRICAN AMERICAN: 67 (ref >59)
GLOBULIN, TOTAL: 3.4 (ref 1.5–4.5)
GLUCOSE: 138 mg/dL — AB (ref 65–99)
Potassium: 3.7 mmol/L (ref 3.5–5.2)
SODIUM: 141 mmol/L (ref 134–144)
Total Protein: 7.5 g/dL (ref 6.0–8.5)

## 2016-11-25 LAB — LIPID PANEL
CHOL/HDL RATIO: 4 (ref 0.0–4.4)
CHOLESTEROL TOTAL: 204 mg/dL — AB (ref 100–199)
HDL: 51 mg/dL (ref >39)
LDL CALC: 130 — AB (ref 0–99)
Triglycerides: 114 mg/dL (ref 0–149)
VLDL CHOLESTEROL CAL: 23 (ref 5–40)

## 2016-11-25 NOTE — Progress Notes (Signed)
11/25/2016 [name] [name]   1934/08/10  [id number]  Primary Physician Arlyss Queen, MD Primary Cardiologist: Dr. Shirlee Latch   Reason for Visit/CC: Routine 6 month f/u for CAD, HTN and HLD  HPI:  83 y/o AAF, followed by Dr. Shirlee Latch, who presents for routine 6 month f/u. She has a history of CAD s/p MI in 2008 s/p PCI to the LAD.She did have 2.75 x 12 Taxus drug-eluting stent placed in the proximal LAD. She did also have 80% first obtuse marginal stenosis, 70% distal circumflex stenosis and 50% proximal RCA stenosis. She initially had depressed LV EF after her MI but this rose back to normal range on echo in 2009. She had an echo done in 3/11 showing preserved LV systolic function and mild diastolic dysfunction. ETT-myoview was done in 7/15 with no ischemia or infarction. She had left THR without complication in 8/15. She also has a h/o hypertension.   Within the last year, she has undergone repeat 2D echo and NST. 2D Echo 01/10/16 showed normal LVEF at 60-65%, normal wall motion and grade 1 DD. Trival MR was noted. NST 01/10/16 was low risk. Negative for ischemia.   She has also had recent medication changes. She was previously on BB therapy with Coreg, however this was fully discontinued given issues with bradycardia with HR in the 40s. Her HR improved after discontinuation and her BP has remained stable.   She is here today with her daughter. She reports she has done well since her last OV. She ambulates with a walker. She denies any CP or dyspnea. No exertional symptoms. She also denies palpitation, LEE, orthopnea, PND, syncope/ near syncope. She reports full medication compliance. HR is in the 70s. BP is well controlled at 128/66.   Current Meds  Medication Sig  . aspirin 81 MG tablet Take 81 mg by mouth daily.  . clopidogrel (PLAVIX) 75 MG tablet TAKE 1 TABLET BY MOUTH ONCE DAILY.  Marland Kitchen enalapril (VASOTEC) 20 MG tablet Take 1 tablet (20 mg total) by mouth 2 (two) times daily.  .  furosemide (LASIX) 40 MG tablet Take 20-40 mg by mouth 2 (two) times daily. Take 1 tablet in the morning Take 0.5 tablet every evening  . hydrochlorothiazide (HYDRODIURIL) 25 MG tablet Take 1 tablet (25 mg total) by mouth daily.  . isosorbide mononitrate (IMDUR) 30 MG 24 hr tablet Take 1 tablet (30 mg total) by mouth daily.  Marland Kitchen levothyroxine (SYNTHROID, LEVOTHROID) 100 MCG tablet Take 100 mcg by mouth daily.    . nitroGLYCERIN (NITROSTAT) 0.4 MG SL tablet Place 0.4 mg under the tongue every 5 (five) minutes as needed for chest pain.  . potassium chloride SA (K-DUR,KLOR-CON) 20 MEQ tablet Take 20 mEq by mouth daily.  . rosuvastatin (CRESTOR) 40 MG tablet Take 1 tablet (40 mg total) by mouth daily.  Marland Kitchen spironolactone (ALDACTONE) 25 MG tablet TAKE 1 TABLET BY MOUTH ONCE DAILY.   No Known Allergies Past Medical History:  Diagnosis Date  . Ankle edema    pt lasix and hctz daily  . Arthritis    right hip  . Bronchitis    in Dec  . CAD (coronary artery disease)    acute MI in 3/08 - taxus stent  . Carotid stenosis    mild  . Cataract    left;not ready to come off  . Constipation    takes OTC stool softener as needed  . Diabetes mellitus    pt doesn't take any meds for it  .  DJD (degenerative joint disease) of hip    right  . Dry cough   . GERD (gastroesophageal reflux disease)   . Headache(784.0)    occasionally  . HTN (hypertension)    takes Amlodpine and Carvedilol daily  . Hx of cardiovascular stress test    Lexiscan Myoview (04/2014):  No ischemia, EF 64%, Low Risk  . Hypercholesteremia    takes Crestor daily  . Hypothyroidism    takes Synthroid daily  . Ileus, postoperative ([facility]) 05/12/2014  . Myocardial infarction    pt unsure but thinks about 35yrs ago;is taking Plavix daily   . Nocturia   . Osteoarthritis of left hip 05/09/2014  . Primary osteoarthritis of right hip 10/27/2011   Family History  Problem Relation Age of Onset  . Hypertension Mother   . Stroke Mother   .  Hypertension Father   . Heart attack Brother   . Heart attack Sister   . Hypertension Sister   . Hypertension Brother   . Hypertension Daughter   . Hypertension Son   . Hyperlipidemia Sister   . Diabetes Brother   . Anesthesia problems Neg Hx   . Hypotension Neg Hx   . Malignant hyperthermia Neg Hx   . Pseudochol deficiency Neg Hx    Past Surgical History:  Procedure Laterality Date  . ABDOMINAL HYSTERECTOMY  20+yrs  . BACK SURGERY  15+yrs ago  . CARDIAC CATHETERIZATION  2008  . CORONARY ARTERY BYPASS GRAFT    . LYMPH NODE DISSECTION  20+yrs ago   right   . TOTAL HIP ARTHROPLASTY  10/27/2011   Procedure: TOTAL HIP ARTHROPLASTY;  Surgeon: Eulas Post, MD;  Location: [facility] OR;  Service: Orthopedics;  Laterality: Right;  . TOTAL HIP ARTHROPLASTY Left 05/09/2014   Procedure: LEFT TOTAL HIP ARTHROPLASTY;  Surgeon: Eulas Post, MD;  Location: [facility] OR;  Service: Orthopedics;  Laterality: Left;   Social History   Social History  . Marital status: Widowed    Spouse name: N/A  . Number of children: N/A  . Years of education: N/A   Occupational History  . Not on file.   Social History Main Topics  . Smoking status: Never Smoker  . Smokeless tobacco: Never Used  . Alcohol use No  . Drug use: No  . Sexual activity: No   Other Topics Concern  . Not on file   Social History Narrative  . No narrative on file     Review of Systems: General: negative for chills, fever, night sweats or weight changes.  Cardiovascular: negative for chest pain, dyspnea on exertion, edema, orthopnea, palpitations, paroxysmal nocturnal dyspnea or shortness of breath Dermatological: negative for rash Respiratory: negative for cough or wheezing Urologic: negative for hematuria Abdominal: negative for nausea, vomiting, diarrhea, bright red blood per rectum, melena, or hematemesis Neurologic: negative for visual changes, syncope, or dizziness All other systems reviewed and are otherwise negative  except as noted above.   Physical Exam:  Blood pressure 128/66, pulse 79, height 4\' 9"  (1.448 m), weight 148 lb 12.8 oz (67.5 kg), SpO2 90 %.  General appearance: alert, cooperative and no distress Neck: no carotid bruit, no JVD, supple, symmetrical, trachea midline and thyroid not enlarged, symmetric, no tenderness/mass/nodules Lungs: clear to auscultation bilaterally Heart: regular rate and rhythm, S1, S2 normal, no murmur, click, rub or gallop Extremities: extremities normal, atraumatic, no cyanosis or edema Pulses: 2+ and symmetric Skin: Skin color, texture, turgor normal. No rashes or lesions Neurologic: Grossly normal  EKG not  performed   ASSESSMENT AND PLAN:   1. CAD: s/p MI in 2008 s/p PCI to LAD with residual 80% first obtuse marginal stenosis, 70% distal circumflex stenosis and 50% proximal RCA stenosis, treated medically. NST 01/2016 low risk for ischemia with normal LVEF. She denies any anginal symptoms. No exertional CP or dyspnea. She is also on a statin, ASA and Plavix. Continue medical therapy.   2. HTN: Well controlled on current regimen. Will check a CMP today to monitor renal function and K.   3. HLD:  on Crestor. Recent FLP 01/10/16 showed LDL to be at goal at 62 mg/dL. TG 65. HDL excellent for her age at 8049. HFTs were normal. We will repeat a FLP today as well as HFTs.   4. Medication monitoring: Pt is on multiple meds that can potentially impact renal function/ potassium including Lasix, HCTZ, spironolactone, an ACE-I and supplemental K. She is also on statin therapy with Crestor. We will check a CMP today to check renal function, hepatic function and electrolytes.   PLAN  F/u in 6 months. Dr. Shirlee LatchMcLean to reassign new primary cardiologist.   Robbie LisBrittainy Jakala Herford PA-C 11/25/2016 10:08 AM

## 2016-11-25 NOTE — Patient Instructions (Addendum)
Medication Instructions:   Your physician recommends that you continue on your current medications as directed. Please refer to the Current Medication list given to you today.   If you need a refill on your cardiac medications before your next appointment, please call your pharmacy.  Labwork: CMET AND LIPIDS TODAY    Testing/Procedures: NONE ORDERED  TODAY    Follow-Up: Your physician wants you to follow-up in:  IN 6 MONTHS [facility]  You will receive a reminder letter in the mail two months in advance. If you don't receive a letter, please call our office to schedule the follow-up appointment.      Any Other Special Instructions Will Be Listed Below (If Applicable).

## 2016-12-08 ENCOUNTER — Encounter: Payer: Self-pay | Admitting: *Deleted

## 2016-12-15 ENCOUNTER — Telehealth: Payer: Self-pay | Admitting: Cardiology

## 2016-12-15 NOTE — Telephone Encounter (Signed)
Patient states that she received a letter from our office and regards to blood work results and would like to discuss. Please call.

## 2016-12-15 NOTE — Telephone Encounter (Signed)
Pt called for her lab results.  She stated that she was taking Crestor.  I had pt go get her medications and it was not in there, and according to our records, pt hasn't gotten a refill since 2014  Do you want pt to go back on? Please advise!

## 2017-01-20 ENCOUNTER — Encounter (HOSPITAL_COMMUNITY): Payer: Self-pay | Admitting: *Deleted

## 2017-01-20 ENCOUNTER — Emergency Department (HOSPITAL_COMMUNITY)
Admission: EM | Admit: 2017-01-20 | Discharge: 2017-01-21 | Disposition: A | Discharge status: Home / Self Care | Payer: Medicare Other | Attending: Emergency Medicine | Admitting: Emergency Medicine

## 2017-01-20 DIAGNOSIS — I10 Essential (primary) hypertension: Secondary | ICD-10-CM | POA: Insufficient documentation

## 2017-01-20 DIAGNOSIS — E119 Type 2 diabetes mellitus without complications: Secondary | ICD-10-CM | POA: Diagnosis not present

## 2017-01-20 DIAGNOSIS — Z79899 Other long term (current) drug therapy: Secondary | ICD-10-CM | POA: Insufficient documentation

## 2017-01-20 DIAGNOSIS — Z951 Presence of aortocoronary bypass graft: Secondary | ICD-10-CM | POA: Insufficient documentation

## 2017-01-20 DIAGNOSIS — R51 Headache: Secondary | ICD-10-CM | POA: Insufficient documentation

## 2017-01-20 DIAGNOSIS — R042 Hemoptysis: Secondary | ICD-10-CM | POA: Diagnosis present

## 2017-01-20 DIAGNOSIS — K1379 Other lesions of oral mucosa: Secondary | ICD-10-CM

## 2017-01-20 DIAGNOSIS — R519 Headache, unspecified: Secondary | ICD-10-CM

## 2017-01-20 DIAGNOSIS — I2510 Atherosclerotic heart disease of native coronary artery without angina pectoris: Secondary | ICD-10-CM | POA: Insufficient documentation

## 2017-01-20 DIAGNOSIS — J40 Bronchitis, not specified as acute or chronic: Secondary | ICD-10-CM | POA: Diagnosis not present

## 2017-01-20 DIAGNOSIS — Z7982 Long term (current) use of aspirin: Secondary | ICD-10-CM | POA: Insufficient documentation

## 2017-01-20 DIAGNOSIS — E039 Hypothyroidism, unspecified: Secondary | ICD-10-CM | POA: Diagnosis not present

## 2017-01-20 NOTE — ED Provider Notes (Signed)
[facility]Y DEPT Provider Note   CSN: [id number] Arrival date & time: 01/20/17  2340   By signing my name below, I, [name] [name], attest that this documentation has been prepared under the direction and in the presence of [name], MD. Electronically Signed: Bobbie [name], Scribe. 01/21/17. 12:14 AM.  Time seen 23:54 PM   History   Chief Complaint Chief Complaint  Patient presents with  . Hemoptysis    The history is provided by the patient. No language interpreter was used.  HPI Comments: [name] [name] is a 83 y.o. female brought in by ambulance, who presents to the Emergency Department complaining of spitting up blood an hour prior to her arrival. She states that she has had a cough of clear-sputum for the past couple of weeks. She reports developing a acute onset sharp constant left sided headache on 01/20/2017 about 9:45 PM. The headache has since become intermittent and only lasts for 10 minutes at a time and then resolves itself. Her headache worsens when coughing. Nothing makes it feel better. She states that she had a similar headache in the summer of 2017.She does not know what her diagnosis was.  The patient states that once she got home, she began to spit up blood. She states that she spit up a "half a cup" of blood once. She states that this has never happened in the past. The patient is unsure of whether she is on blood thinners or not. She denies numbness or weakness in her legs. The patient lives with her daughter and grandson. She typically ambulates with a cane. She also denies fevers, nausea, vomiting, diarrhea, chest pain, SOB or abdominal pain.  PCP Arlyss Queen, MD   Past Medical History:  Diagnosis Date  . Ankle edema    pt lasix and hctz daily  . Arthritis    right hip  . Bronchitis    in Dec  . CAD (coronary artery disease)    acute MI in 3/08 - taxus stent  . Carotid stenosis    mild  . Cataract    left;not ready to come off  .  Constipation    takes OTC stool softener as needed  . Diabetes mellitus    pt doesn't take any meds for it  . DJD (degenerative joint disease) of hip    right  . Dry cough   . GERD (gastroesophageal reflux disease)   . Headache(784.0)    occasionally  . HTN (hypertension)    takes Amlodpine and Carvedilol daily  . Hx of cardiovascular stress test    Lexiscan Myoview (04/2014):  No ischemia, EF 64%, Low Risk  . Hypercholesteremia    takes Crestor daily  . Hypothyroidism    takes Synthroid daily  . Ileus, postoperative (HCC) 05/12/2014  . Myocardial infarction [facility])    pt unsure but thinks about 29yrs ago;is taking Plavix daily   . Nocturia   . Osteoarthritis of left hip 05/09/2014  . Primary osteoarthritis of right hip 10/27/2011    Patient Active Problem List   Diagnosis Date Noted  . Ileus, postoperative (HCC) 05/12/2014  . Osteoarthritis of left hip 05/09/2014  . Hip arthritis 05/09/2014  . Primary osteoarthritis of right hip 10/27/2011  . Hip pain 08/24/2011  . CHRONIC DIASTOLIC HEART FAILURE 12/24/2009  . SHORTNESS OF BREATH 11/29/2009  . Hyperlipidemia 09/17/2008  . Essential hypertension 09/17/2008  . CAD, NATIVE VESSEL 09/17/2008  . CAROTID ARTERY STENOSIS 09/17/2008    Past Surgical History:  Procedure Laterality  Date  . ABDOMINAL HYSTERECTOMY  20+yrs  . BACK SURGERY  15+yrs ago  . CARDIAC CATHETERIZATION  2008  . CORONARY ARTERY BYPASS GRAFT    . LYMPH NODE DISSECTION  20+yrs ago   right   . TOTAL HIP ARTHROPLASTY  10/27/2011   Procedure: TOTAL HIP ARTHROPLASTY;  Surgeon: Eulas Post, MD;  Location: [facility] OR;  Service: Orthopedics;  Laterality: Right;  . TOTAL HIP ARTHROPLASTY Left 05/09/2014   Procedure: LEFT TOTAL HIP ARTHROPLASTY;  Surgeon: Eulas Post, MD;  Location: [facility] OR;  Service: Orthopedics;  Laterality: Left;    OB History    No data available       Home Medications    Prior to Admission medications   Medication Sig Start Date End Date  Taking? Authorizing Provider  amoxicillin (AMOXIL) 500 MG capsule Take 1 capsule (500 mg total) by mouth 3 (three) times daily. 01/21/17   [name], MD  aspirin 81 MG tablet Take 81 mg by mouth daily.    Historical Provider, MD  clopidogrel (PLAVIX) 75 MG tablet TAKE 1 TABLET BY MOUTH ONCE DAILY. 08/26/16   Rosalio Macadamia, NP  enalapril (VASOTEC) 20 MG tablet Take 1 tablet (20 mg total) by mouth 2 (two) times daily. 07/31/14   Laurey Morale, MD  furosemide (LASIX) 40 MG tablet Take 20-40 mg by mouth 2 (two) times daily. Take 1 tablet in the morning Take 0.5 tablet every evening    Historical Provider, MD  hydrochlorothiazide (HYDRODIURIL) 25 MG tablet Take 1 tablet (25 mg total) by mouth daily. 07/31/14   Laurey Morale, MD  isosorbide mononitrate (IMDUR) 30 MG 24 hr tablet Take 1 tablet (30 mg total) by mouth daily. 02/11/16   Brittainy Sherlynn Carbon, PA-C  levothyroxine (SYNTHROID, LEVOTHROID) 100 MCG tablet Take 100 mcg by mouth daily.      Historical Provider, MD  nitroGLYCERIN (NITROSTAT) 0.4 MG SL tablet Place 0.4 mg under the tongue every 5 (five) minutes as needed for chest pain.    Historical Provider, MD  potassium chloride SA (K-DUR,KLOR-CON) 20 MEQ tablet Take 20 mEq by mouth daily.    Historical Provider, MD  rosuvastatin (CRESTOR) 40 MG tablet Take 1 tablet (40 mg total) by mouth daily. 10/19/12   Laurey Morale, MD  spironolactone (ALDACTONE) 25 MG tablet TAKE 1 TABLET BY MOUTH ONCE DAILY. 08/27/16   Rosalio Macadamia, NP    Family History Family History  Problem Relation Age of Onset  . Hypertension Mother   . Stroke Mother   . Hypertension Father   . Heart attack Brother   . Heart attack Sister   . Hypertension Sister   . Hypertension Brother   . Hypertension Daughter   . Hypertension Son   . Hyperlipidemia Sister   . Diabetes Brother   . Anesthesia problems Neg Hx   . Hypotension Neg Hx   . Malignant hyperthermia Neg Hx   . Pseudochol deficiency Neg Hx     Social  History Social History  Substance Use Topics  . Smoking status: Never Smoker  . Smokeless tobacco: Never Used  . Alcohol use No  lives with daughter and grandson Uses a cane   Allergies   Patient has no known allergies.   Review of Systems Review of Systems  Constitutional: Negative for fever.  Respiratory: Positive for cough.        Positive for spitting up blood.  Gastrointestinal: Negative for abdominal pain, diarrhea, nausea and vomiting.  Neurological:  Positive for headaches. Negative for weakness and numbness.  All other systems reviewed and are negative.    Physical Exam Updated Vital Signs BP 135/73   Pulse (!) 49   Temp 97.9 F (36.6 C) (Oral)   Resp 16   Ht  (1.626 m)   Wt 145 lb (65.8 kg)   SpO2 99%   BMI 24.89 kg/m   Vital signs normal except for bradycardia   Physical Exam  Constitutional: She is oriented to person, place, and time. She appears well-developed and well-nourished.  Non-toxic appearance. She does not appear ill. No distress.  HENT:  Head: Normocephalic and atraumatic.  Right Ear: External ear normal.  Left Ear: External ear normal.  Nose: Nose normal. No mucosal edema or rhinorrhea.  Mouth/Throat: Oropharynx is clear and moist and mucous membranes are normal. No dental abscesses or uvula swelling.  No blood seen in mouth  Eyes: Conjunctivae and EOM are normal. Pupils are equal, round, and reactive to light.  Neck: Normal range of motion and full passive range of motion without pain. Neck supple.  No bruits.   Cardiovascular: Regular rhythm and normal heart sounds.  Bradycardia present.  Exam reveals no gallop and no friction rub.   No murmur heard. Pulmonary/Chest: Effort normal and breath sounds normal. No respiratory distress. She has no wheezes. She has no rhonchi. She has no rales. She exhibits no tenderness and no crepitus.  Abdominal: Soft. Normal appearance and bowel sounds are normal. She exhibits no distension. There is  no tenderness. There is no rebound and no guarding.  Musculoskeletal: Normal range of motion. She exhibits no edema or tenderness.  Moves all extremities well.   Neurological: She is alert and oriented to person, place, and time. She has normal strength. No cranial nerve deficit.  Grips are equal. No motor weakness noted. No pronator drift.  Skin: Skin is warm, dry and intact. No rash noted. No erythema. No pallor.  Psychiatric: She has a normal mood and affect. Her speech is normal and behavior is normal. Her mood appears not anxious.  Nursing note and vitals reviewed.    ED Treatments / Results  DIAGNOSTIC STUDIES: Oxygen Saturation is 99% on RA, normal by my interpretation.    Labs (all labs ordered are listed, but only abnormal results are displayed) Results for orders placed or performed during the hospital encounter of 01/20/17  Urinalysis, Routine w reflex microscopic  Result Value Ref Range   Color, Urine COLORLESS (A) YELLOW   APPearance CLEAR CLEAR   Specific Gravity, Urine 1.005 1.005 - 1.030   pH 6.0 5.0 - 8.0   Glucose, UA NEGATIVE NEGATIVE mg/dL   Hgb urine dipstick NEGATIVE NEGATIVE   Bilirubin Urine NEGATIVE NEGATIVE   Ketones, ur NEGATIVE NEGATIVE mg/dL   Protein, ur NEGATIVE NEGATIVE mg/dL   Nitrite NEGATIVE NEGATIVE   Leukocytes, UA NEGATIVE NEGATIVE  Comprehensive metabolic panel  Result Value Ref Range   Sodium 137 135 - 145 mmol/L   Potassium 3.0 (L) 3.5 - 5.1 mmol/L   Chloride 102 101 - 111 mmol/L   CO2 28 22 - 32 mmol/L   Glucose, Bld 154 (H) 65 - 99 mg/dL   BUN 8 6 - 20 mg/dL   Creatinine, Ser 9.60 0.44 - 1.00 mg/dL   Calcium 8.6 (L) 8.9 - 10.3 mg/dL   Total Protein 7.2 6.5 - 8.1 g/dL   Albumin 3.4 (L) 3.5 - 5.0 g/dL   AST 31 15 - 41 U/L   ALT  36 14 - 54 U/L   Alkaline Phosphatase 68 38 - 126 U/L   Total Bilirubin 0.4 0.3 - 1.2 mg/dL   GFR calc non Af Amer >60 >60 mL/min   GFR calc Af Amer >60 >60 mL/min   Anion gap 7 5 - 15  CBC with  Differential  Result Value Ref Range   WBC 6.7 4.0 - 10.5 K/uL   RBC 3.77 (L) 3.87 - 5.11 MIL/uL   Hemoglobin 11.7 (L) 12.0 - 15.0 g/dL   HCT 11.9 (L) 14.7 - 82.9 %   MCV 95.2 78.0 - 100.0 fL   MCH 31.0 26.0 - 34.0 pg   MCHC 32.6 30.0 - 36.0 g/dL   RDW 56.2 13.0 - 86.5 %   Platelets 313 150 - 400 K/uL   Neutrophils Relative % 59 %   Neutro Abs 4.0 1.7 - 7.7 K/uL   Lymphocytes Relative 30 %   Lymphs Abs 2.0 0.7 - 4.0 K/uL   Monocytes Relative 10 %   Monocytes Absolute 0.6 0.1 - 1.0 K/uL   Eosinophils Relative 1 %   Eosinophils Absolute 0.1 0.0 - 0.7 K/uL   Basophils Relative 0 %   Basophils Absolute 0.0 0.0 - 0.1 K/uL  Protime-INR  Result Value Ref Range   Prothrombin Time 13.2 11.4 - 15.2 seconds   INR 1.00   APTT  Result Value Ref Range   aPTT 29 24 - 36 seconds   Laboratory interpretation all normal except mild anemia    EKG  EKG Interpretation  Date/Time:  Tuesday January 20 2017 23:47:34 EDT Ventricular Rate:  51 PR Interval:    QRS Duration: 101 QT Interval:  354 QTC Calculation: 326 R Axis:   71 Text Interpretation:  Sinus or ectopic atrial rhythm Borderline short PR interval Anteroseptal infarct, old Repol abnrm suggests ischemia, diffuse leads Baseline wander Artifact No significant change since last tracing 16 May 2016 Confirmed by Jessly Lebeck  MD-I, Mitch Arquette ([id number]) on 01/21/2017 12:49:36 AM       Radiology Dg Chest 2 View  Result Date: 01/21/2017 CLINICAL DATA:  Left-sided headache and cough for 2 weeks. Vomiting blood tonight. On Plavix. EXAM: CHEST  2 VIEW COMPARISON:  05/16/2016 FINDINGS: Normal heart size and pulmonary vascularity. No focal airspace disease or consolidation in the lungs. No blunting of costophrenic angles. No pneumothorax. Mediastinal contours appear intact. Calcification of the aorta. IMPRESSION: No active cardiopulmonary disease. Electronically Signed   By: Burman Nieves M.D.   On: 01/21/2017 01:04   Ct Head Wo Contrast  Result Date:  01/21/2017 CLINICAL DATA:  Acute onset left-sided headache. Patient is on Plavix. EXAM: CT HEAD WITHOUT CONTRAST TECHNIQUE: Contiguous axial images were obtained from the base of the skull through the vertex without intravenous contrast. COMPARISON:  None. FINDINGS: Brain: Mild cerebral atrophy. Mild ventricular dilatation consistent with central atrophy. Patchy low-attenuation changes in the deep white matter likely represent small vessel ischemia. No mass effect or midline shift. No abnormal extra-axial fluid collections. Gray-white matter junctions are distinct. Basal cisterns are not effaced. No acute intracranial hemorrhage. Vascular: Intracranial arterial calcifications are present. Skull: Calvarium appears intact. Sinuses/Orbits: Mucosal thickening in the paranasal sinuses. No acute air-fluid levels. Mastoid air cells are not opacified. Other: Congenital nonunion of the posterior arch of C1. IMPRESSION: No acute intracranial abnormalities. Mild chronic atrophy and small vessel ischemic changes. Electronically Signed   By: Burman Nieves M.D.   On: 01/21/2017 01:03    Procedures Procedures (including critical care time)  Medications  Ordered in ED Medications  metoCLOPramide (REGLAN) injection 5 mg (5 mg Intravenous Given 01/21/17 0027)  diphenhydrAMINE (BENADRYL) injection 12.5 mg (12.5 mg Intravenous Given 01/21/17 0027)     Initial Impression / Assessment and Plan / ED Course  I have reviewed the triage vital signs and the nursing notes.  Pertinent labs & imaging results that were available during my care of the patient were reviewed by me and considered in my medical decision making (see chart for details).    COORDINATION OF CARE: 12:04 AM Discussed treatment plan with pt at bedside and pt agreed to plan. I will check the patient's CXR, CT head, and labs. Patient presents with acute onset of headache that was constant and is now intermittent. It is not clear whether she is still on her  Plavix or not. Concern is for a intracranial hemorrhage. She also reports cough for the past 2 weeks, coughing can increase the risk of intracranial hemorrhaging. She has x-ray was ordered to make sure she does not have pneumonia or other abnormality such as infiltrate consistent with TB to account for hemoptysis. CT scan the head was ordered to look for intracranial hemorrhage or other abnormality. While waiting for testing patient will be given a lower dose of migraine cocktail for her headache to see if that resolves it.  Recheck at time of discharge patient states her headache is improved. She's had no further evidence of bleeding during her ED visit. It is not clear to me whether she had blood from her oral pharynx or she coughed up blood or vomited blood. However we discussed most likely with her coughing for the past 2 weeks she may have had a ruptured capillary blood vessel that had a short burst of bleeding and hopefully it has resolved. I am going to treat her bronchitis with antibiotics since she's been coughing for 2 weeks. She can take Mucinex DM over-the-counter to help suppress her coughing. Her daughter thinks she was taken off her blood thinners however they will check with her doctor this week. She should return if she gets a fever, struggle to breathe, or if she continues to cough up a lot of blood or if she has any problems listed on the head injury sheet. Her headache at this point does not appear to be a sign of a severe underlying neurological problem.    Final Clinical Impressions(s) / ED Diagnoses   Final diagnoses:  Oral bleeding  Bronchitis  Left-sided headache    New Prescriptions New Prescriptions   AMOXICILLIN (AMOXIL) 500 MG CAPSULE    Take 1 capsule (500 mg total) by mouth 3 (three) times daily.   Plan discharge  [name], MD, FACEP   I personally performed the services described in this documentation, which was scribed in my presence. The recorded information  has been reviewed and considered.  [name], MD, Concha Pyo, MD 01/21/17 [phone]

## 2017-01-20 NOTE — ED Triage Notes (Signed)
Pt c/o coughing up blood x 1 hour pta; pt is also c/o a headache mostly to the left

## 2017-01-21 ENCOUNTER — Emergency Department (HOSPITAL_COMMUNITY): Payer: Medicare Other

## 2017-01-21 DIAGNOSIS — J40 Bronchitis, not specified as acute or chronic: Secondary | ICD-10-CM | POA: Diagnosis not present

## 2017-01-21 LAB — COMPREHENSIVE METABOLIC PANEL
ALK PHOS: 68 U/L (ref 38–126)
ALT: 36 U/L (ref 14–54)
AST: 31 U/L (ref 15–41)
Albumin: 3.4 g/dL — ABNORMAL LOW (ref 3.5–5.0)
Anion gap: 7 (ref 5–15)
BUN: 8 mg/dL (ref 6–20)
CALCIUM: 8.6 mg/dL — AB (ref 8.9–10.3)
CO2: 28 mmol/L (ref 22–32)
CREATININE: 0.83 mg/dL (ref 0.44–1.00)
Chloride: 102 mmol/L (ref 101–111)
Glucose, Bld: 154 mg/dL — ABNORMAL HIGH (ref 65–99)
Potassium: 3 mmol/L — ABNORMAL LOW (ref 3.5–5.1)
SODIUM: 137 mmol/L (ref 135–145)
Total Bilirubin: 0.4 mg/dL (ref 0.3–1.2)
Total Protein: 7.2 g/dL (ref 6.5–8.1)

## 2017-01-21 LAB — CBC WITH DIFFERENTIAL/PLATELET
BASOS PCT: 0 %
Basophils Absolute: 0 10*3/uL (ref 0.0–0.1)
EOS ABS: 0.1 10*3/uL (ref 0.0–0.7)
EOS PCT: 1 %
HCT: 35.9 % — ABNORMAL LOW (ref 36.0–46.0)
HEMOGLOBIN: 11.7 g/dL — AB (ref 12.0–15.0)
LYMPHS ABS: 2 10*3/uL (ref 0.7–4.0)
Lymphocytes Relative: 30 %
MCH: 31 pg (ref 26.0–34.0)
MCHC: 32.6 g/dL (ref 30.0–36.0)
MCV: 95.2 fL (ref 78.0–100.0)
MONOS PCT: 10 %
Monocytes Absolute: 0.6 10*3/uL (ref 0.1–1.0)
Neutro Abs: 4 10*3/uL (ref 1.7–7.7)
Neutrophils Relative %: 59 %
PLATELETS: 313 10*3/uL (ref 150–400)
RBC: 3.77 MIL/uL — ABNORMAL LOW (ref 3.87–5.11)
RDW: 13.1 % (ref 11.5–15.5)
WBC: 6.7 10*3/uL (ref 4.0–10.5)

## 2017-01-21 LAB — URINALYSIS, ROUTINE W REFLEX MICROSCOPIC
Bilirubin Urine: NEGATIVE
GLUCOSE, UA: NEGATIVE mg/dL
HGB URINE DIPSTICK: NEGATIVE
Ketones, ur: NEGATIVE mg/dL
LEUKOCYTES UA: NEGATIVE
Nitrite: NEGATIVE
Protein, ur: NEGATIVE mg/dL
SPECIFIC GRAVITY, URINE: 1.005 (ref 1.005–1.030)
pH: 6 (ref 5.0–8.0)

## 2017-01-21 LAB — PROTIME-INR
INR: 1
PROTHROMBIN TIME: 13.2 s (ref 11.4–15.2)

## 2017-01-21 LAB — APTT: aPTT: 29 seconds (ref 24–36)

## 2017-01-21 MED ORDER — AMOXICILLIN 500 MG PO CAPS: 500.0000 mg | ORAL_CAPSULE | Freq: Three times a day (TID) | ORAL | 0 refills | Status: DC

## 2017-01-21 MED ORDER — METOCLOPRAMIDE HCL 5 MG/ML IJ SOLN
5.0000 mg | Freq: Once | INTRAMUSCULAR | Status: AC
  Administered 2017-01-21: 5 mg via INTRAVENOUS
  Filled 2017-01-21: qty 2

## 2017-01-21 MED ORDER — DIPHENHYDRAMINE HCL 50 MG/ML IJ SOLN
12.5000 mg | Freq: Once | INTRAMUSCULAR | Status: AC
  Administered 2017-01-21: 12.5 mg via INTRAVENOUS
  Filled 2017-01-21: qty 1

## 2017-01-21 NOTE — ED Notes (Signed)
Patient returned from radiology

## 2017-01-21 NOTE — ED Notes (Signed)
Patient to CT.

## 2017-01-21 NOTE — Discharge Instructions (Signed)
Take mucinex DM OTC for cough. Take the antibiotics until gone. Recheck if you get a fever, struggle to breathe or cough up a lot of blood.  Also return to the ED for any symptoms in the head injury sheet.

## 2017-04-15 ENCOUNTER — Other Ambulatory Visit: Payer: Self-pay | Admitting: Cardiology

## 2017-04-15 NOTE — Telephone Encounter (Signed)
REFILL 

## 2017-08-24 ENCOUNTER — Other Ambulatory Visit: Payer: Self-pay | Admitting: Cardiology

## 2017-08-24 ENCOUNTER — Encounter: Payer: Self-pay | Admitting: Cardiology

## 2017-08-28 ENCOUNTER — Other Ambulatory Visit: Payer: Self-pay | Admitting: Nurse Practitioner

## 2017-09-03 ENCOUNTER — Ambulatory Visit: Payer: Medicare Other | Admitting: Cardiology

## 2017-09-10 ENCOUNTER — Encounter: Payer: Self-pay | Admitting: Cardiology

## 2017-10-09 ENCOUNTER — Encounter: Payer: Self-pay | Admitting: Cardiology

## 2017-10-09 ENCOUNTER — Ambulatory Visit (INDEPENDENT_AMBULATORY_CARE_PROVIDER_SITE_OTHER): Payer: Medicare Other | Admitting: Cardiology

## 2017-10-09 VITALS — BP 110/60 | HR 73 | Ht 64.0 in | Wt 148.4 lb

## 2017-10-09 DIAGNOSIS — I10 Essential (primary) hypertension: Secondary | ICD-10-CM | POA: Diagnosis not present

## 2017-10-09 DIAGNOSIS — Z79899 Other long term (current) drug therapy: Secondary | ICD-10-CM

## 2017-10-09 DIAGNOSIS — I2510 Atherosclerotic heart disease of native coronary artery without angina pectoris: Secondary | ICD-10-CM

## 2017-10-09 DIAGNOSIS — E7849 Other hyperlipidemia: Secondary | ICD-10-CM

## 2017-10-09 NOTE — Progress Notes (Signed)
10/09/2017 [name] [name]   07/03/1934  [id number]  Primary Physician Arlyss QueenSelvidge, William M, MD Primary Cardiologist: Dr. Shirlee LatchMcLean >> Dr. Okey DupreEnd   Reason for Visit/CC: routine f/u for CAD, HTN and HLD  HPI:  [name] [name] is a 84 y.o. with a history of CAD s/p MI in 2008 s/p PCI to the LAD.She did have 2.75 x 12 Taxus drug-eluting stent placed in the proximal LAD. She did also have 80% first obtuse marginal stenosis, 70% distal circumflex stenosis and 50% proximal RCA stenosis. She initially had depressed LV EF after her MI but this rose back to normal range on echo in 2009. She had an echo done in 3/11 showing preserved LV systolic function and mild diastolic dysfunction. ETT-myoview was done in 7/15 with no ischemia or infarction. She had left THR without complication in 8/15. She also has a h/o hypertension and HLD.  In 2017, she underwent additional cardiac w/u for dyspnea. 2D Echo 01/10/16 showed normal LVEF at 60-65%, normal wall motion and grade 1 DD. Trival MR was noted. NST 01/10/16 was low risk. Negative for ischemia. She had issues with bradycardia with beta blocker therapy. This was discontinued and her bradycardia resolved and dyspnea improved.   She has been followed in the past by Dr. Shirlee LatchMcLean, with plans to transition her to Dr. Okey DupreEnd.   She presents to clinic today for routine f/u. She notes she has done well. EKG today shows TWIs in anterolateral leads, also present on EKG in 2017. She denies any anginal symptoms. No exertional CP or dyspnea. She lives with her daughter but still does cooking and cleaning around the home independently w/o limitation.   Current Meds  Medication Sig  . clopidogrel (PLAVIX) 75 MG tablet Take 75 mg by mouth daily.  Marland Kitchen. spironolactone (ALDACTONE) 25 MG tablet Take 25 mg by mouth daily.   No Known Allergies Past Medical History:  Diagnosis Date  . Ankle edema    pt lasix and hctz daily  . Arthritis    right hip  . Bronchitis    in Dec  . CAD  (coronary artery disease)    acute MI in 3/08 - taxus stent  . Carotid stenosis    mild  . Cataract    left;not ready to come off  . Constipation    takes OTC stool softener as needed  . Diabetes mellitus    pt doesn't take any meds for it  . DJD (degenerative joint disease) of hip    right  . Dry cough   . GERD (gastroesophageal reflux disease)   . Headache(784.0)    occasionally  . HTN (hypertension)    takes Amlodpine and Carvedilol daily  . Hx of cardiovascular stress test    Lexiscan Myoview (04/2014):  No ischemia, EF 64%, Low Risk  . Hypercholesteremia    takes Crestor daily  . Hypothyroidism    takes Synthroid daily  . Ileus, postoperative (HCC) 05/12/2014  . Myocardial infarction [facility](HCC)    pt unsure but thinks about 1369yrs ago;is taking Plavix daily   . Nocturia   . Osteoarthritis of left hip 05/09/2014  . Primary osteoarthritis of right hip 10/27/2011   Family History  Problem Relation Age of Onset  . Hypertension Mother   . Stroke Mother   . Hypertension Father   . Heart attack Brother   . Heart attack Sister   . Hypertension Sister   . Hypertension Brother   . Hypertension Daughter   . Hypertension Son   .  Hyperlipidemia Sister   . Diabetes Brother   . Anesthesia problems Neg Hx   . Hypotension Neg Hx   . Malignant hyperthermia Neg Hx   . Pseudochol deficiency Neg Hx    Past Surgical History:  Procedure Laterality Date  . ABDOMINAL HYSTERECTOMY  20+yrs  . BACK SURGERY  15+yrs ago  . CARDIAC CATHETERIZATION  2008  . CORONARY ARTERY BYPASS GRAFT    . LYMPH NODE DISSECTION  20+yrs ago   right   . TOTAL HIP ARTHROPLASTY  10/27/2011   Procedure: TOTAL HIP ARTHROPLASTY;  Surgeon: Eulas Post, MD;  Location: [facility] OR;  Service: Orthopedics;  Laterality: Right;  . TOTAL HIP ARTHROPLASTY Left 05/09/2014   Procedure: LEFT TOTAL HIP ARTHROPLASTY;  Surgeon: Eulas Post, MD;  Location: [facility] OR;  Service: Orthopedics;  Laterality: Left;   Social History    Socioeconomic History  . Marital status: Widowed    Spouse name: Not on file  . Number of children: Not on file  . Years of education: Not on file  . Highest education level: Not on file  Social Needs  . Financial resource strain: Not on file  . Food insecurity - worry: Not on file  . Food insecurity - inability: Not on file  . Transportation needs - medical: Not on file  . Transportation needs - non-medical: Not on file  Occupational History  . Not on file  Tobacco Use  . Smoking status: Never Smoker  . Smokeless tobacco: Never Used  Substance and Sexual Activity  . Alcohol use: No  . Drug use: No  . Sexual activity: No    Birth control/protection: Surgical  Other Topics Concern  . Not on file  Social History Narrative  . Not on file     Review of Systems: General: negative for chills, fever, night sweats or weight changes.  Cardiovascular: negative for chest pain, dyspnea on exertion, edema, orthopnea, palpitations, paroxysmal nocturnal dyspnea or shortness of breath Dermatological: negative for rash Respiratory: negative for cough or wheezing Urologic: negative for hematuria Abdominal: negative for nausea, vomiting, diarrhea, bright red blood per rectum, melena, or hematemesis Neurologic: negative for visual changes, syncope, or dizziness All other systems reviewed and are otherwise negative except as noted above.   Physical Exam:  There were no vitals taken for this visit.  General appearance: alert, cooperative and no distress Neck: no carotid bruit and no JVD Lungs: clear to auscultation bilaterally Heart: regular rate and rhythm, S1, S2 normal, no murmur, click, rub or gallop Extremities: extremities normal, atraumatic, no cyanosis or edema Pulses: 2+ and symmetric Skin: Skin color, texture, turgor normal. No rashes or lesions Neurologic: Grossly normal  EKG NSR with TWIs in lateral leads (seen on EKG in 2017) -- personally reviewed   ASSESSMENT AND  PLAN:   1. CAD: h/o MI in 2008, treated with PCI to the LAD with residual 80% 1st OM, 70% distal Lcx and 50% proximal RCA, treated medically. NST in 2015 and again in 2017 showed no ischemia and normal LVEF. EKG today shows TWIs in anterolateral leads, also present on EKG in 2017. She denies any anginal symptoms. No exertional CP or dyspnea. Continue medical therapy. ASA, Plavix, Crestor, Imdur, Enalapril. No BB given h/o bradycardia.   2. HTN: controlled on current regimen. No changes today.   3. HLD: on statin therapy. She needs updated lipid panel. Will order. Not fasting today. Pt to return on another day for labs. Goal LDL in setting of CAD is <  70 mg/dl.   4. Medication Monitoring: Pt is on multiple meds that can potentially impact renal function/ potassium including Lasix, HCTZ, spironolactone, an ACE-I and supplemental K. She is also on statin therapy with Crestor. We will check a CMP today to check renal function, hepatic function and electrolytes. will also check CBC given ASA and Plavix.    Follow-Up in 6 months with Dr. Okey Dupre.   Knute Neu, MHS [facility] 10/09/2017 2:31 PM

## 2017-10-09 NOTE — Patient Instructions (Signed)
Medication Instructions:  Your physician recommends that you continue on your current medications as directed. Please refer to the Current Medication list given to you today.  Labwork: Your physician recommends that you have lab work today- CMET and CBC  Testing/Procedures: NONE  Follow-Up: Your physician wants you to follow-up in: 6 month with Dr. Okey DupreEnd. You will receive a reminder letter in the mail two months in advance. If you don't receive a letter, please call our office to schedule the follow-up appointment.    If you need a refill on your cardiac medications before your next appointment, please call your pharmacy.

## 2017-10-10 LAB — CBC WITH DIFFERENTIAL/PLATELET
BASOS: 1 %
Basophils Absolute: 0 10*3/uL (ref 0.0–0.2)
EOS (ABSOLUTE): 0.1 10*3/uL (ref 0.0–0.4)
Eos: 2 %
HEMOGLOBIN: 13 g/dL (ref 11.1–15.9)
Hematocrit: 38.5 % (ref 34.0–46.6)
IMMATURE GRANS (ABS): 0 10*3/uL (ref 0.0–0.1)
Immature Granulocytes: 0 %
Lymphocytes Absolute: 3.6 10*3/uL — ABNORMAL HIGH (ref 0.7–3.1)
Lymphs: 59 %
MCH: 31.6 pg (ref 26.6–33.0)
MCHC: 33.8 g/dL (ref 31.5–35.7)
MCV: 93 fL (ref 79–97)
Monocytes Absolute: 0.6 10*3/uL (ref 0.1–0.9)
Monocytes: 10 %
NEUTROS ABS: 1.7 10*3/uL (ref 1.4–7.0)
NEUTROS PCT: 28 %
PLATELETS: 273 10*3/uL (ref 150–379)
RBC: 4.12 x10E6/uL (ref 3.77–5.28)
RDW: 13.2 % (ref 12.3–15.4)
WBC: 6.1 10*3/uL (ref 3.4–10.8)

## 2017-10-10 LAB — COMPREHENSIVE METABOLIC PANEL
ALT: 14 IU/L (ref 0–32)
AST: 18 IU/L (ref 0–40)
Albumin/Globulin Ratio: 1.2 (ref 1.2–2.2)
Albumin: 4.4 g/dL (ref 3.5–4.7)
Alkaline Phosphatase: 80 IU/L (ref 39–117)
BILIRUBIN TOTAL: 0.3 mg/dL (ref 0.0–1.2)
BUN/Creatinine Ratio: 12 (ref 12–28)
BUN: 9 mg/dL (ref 8–27)
CALCIUM: 9.2 mg/dL (ref 8.7–10.3)
CHLORIDE: 99 mmol/L (ref 96–106)
CO2: 25 mmol/L (ref 20–29)
Creatinine, Ser: 0.77 mg/dL (ref 0.57–1.00)
GFR calc Af Amer: 84 mL/min/{1.73_m2} (ref >59)
GFR, EST NON AFRICAN AMERICAN: 73 mL/min/{1.73_m2} (ref >59)
GLOBULIN, TOTAL: 3.8 g/dL (ref 1.5–4.5)
Glucose: 128 mg/dL — ABNORMAL HIGH (ref 65–99)
Potassium: 3.3 mmol/L — ABNORMAL LOW (ref 3.5–5.2)
SODIUM: 142 mmol/L (ref 134–144)
Total Protein: 8.2 g/dL (ref 6.0–8.5)

## 2018-01-23 ENCOUNTER — Telehealth: Payer: Self-pay | Admitting: Cardiology

## 2018-01-23 NOTE — Telephone Encounter (Signed)
I spoke with Dr. Mayford KnifeWilliams at [facility].  The patient is an 84 year old female with CAD and prior MI with PCI. She was at home this evening and had a nonspecific feeling of being unwell with nausea and diaphoresis. She called EMS who brought the patient into the ED.  At the outside ED the patient denied any present or recent chest pain or dyspnea. ECG shows TWI in V2-V6, similar from prior. Workup was notable for lactate 2.6, troponin 0.111. Cr 1.12 (appears dry). CBC normal.   Given nonspecific presentation and lactate elevation, recommended transfer to hospitalist team for further workup of symptoms and lab abnormalities. Cardiology is available for consultation if/when needed.

## 2018-02-10 ENCOUNTER — Encounter (INDEPENDENT_AMBULATORY_CARE_PROVIDER_SITE_OTHER): Payer: Self-pay

## 2018-02-10 ENCOUNTER — Ambulatory Visit (INDEPENDENT_AMBULATORY_CARE_PROVIDER_SITE_OTHER): Payer: Medicare Other | Admitting: Cardiology

## 2018-02-10 ENCOUNTER — Encounter: Payer: Self-pay | Admitting: Cardiology

## 2018-02-10 ENCOUNTER — Ambulatory Visit: Payer: Medicare Other | Admitting: Cardiology

## 2018-02-10 VITALS — BP 160/82 | HR 88 | Ht 64.0 in | Wt 146.2 lb

## 2018-02-10 DIAGNOSIS — E782 Mixed hyperlipidemia: Secondary | ICD-10-CM

## 2018-02-10 DIAGNOSIS — R55 Syncope and collapse: Secondary | ICD-10-CM

## 2018-02-10 DIAGNOSIS — I10 Essential (primary) hypertension: Secondary | ICD-10-CM | POA: Diagnosis not present

## 2018-02-10 DIAGNOSIS — I2510 Atherosclerotic heart disease of native coronary artery without angina pectoris: Secondary | ICD-10-CM

## 2018-02-10 MED ORDER — AMLODIPINE BESYLATE 5 MG PO TABS: 5.0000 mg | ORAL_TABLET | Freq: Every day | ORAL | 3 refills | Status: DC

## 2018-02-10 NOTE — Patient Instructions (Addendum)
Medication Instructions:  Your physician has recommended you make the following change in your medication:   1. STOP: Hydrochlorothiazide  2. START: amlodipine 5 mg tablet: Take 1 tablet by mouth daily  Labwork: TODAY: BMET, LIPIDS  Testing/Procedures: None ordered  Follow-Up: Your physician wants you to follow-up in: 6 months with Dr. Okey Dupre You will receive a reminder letter in the mail two months in advance. If you don't receive a letter, please call our office to schedule the follow-up appointment.   Any Other Special Instructions Will Be Listed Below (If Applicable).   Low-Sodium Eating Plan Sodium, which is an element that makes up salt, helps you maintain a healthy balance of fluids in your body. Too much sodium can increase your blood pressure and cause fluid and waste to be held in your body. Your health care provider or dietitian may recommend following this plan if you have high blood pressure (hypertension), kidney disease, liver disease, or heart failure. Eating less sodium can help lower your blood pressure, reduce swelling, and protect your heart, liver, and kidneys. What are tips for following this plan? General guidelines  Most people on this plan should limit their sodium intake to 1,500-2,000 mg (milligrams) of sodium each day. Reading food labels  The Nutrition Facts label lists the amount of sodium in one serving of the food. If you eat more than one serving, you must multiply the listed amount of sodium by the number of servings.  Choose foods with less than 140 mg of sodium per serving.  Avoid foods with 300 mg of sodium or more per serving. Shopping  Look for lower-sodium products, often labeled as "low-sodium" or "no salt added."  Always check the sodium content even if foods are labeled as "unsalted" or "no salt added".  Buy fresh foods. ? Avoid canned foods and premade or frozen meals. ? Avoid canned, cured, or processed meats  Buy breads that have  less than 80 mg of sodium per slice. Cooking  Eat more home-cooked food and less restaurant, buffet, and fast food.  Avoid adding salt when cooking. Use salt-free seasonings or herbs instead of table salt or sea salt. Check with your health care provider or pharmacist before using salt substitutes.  Cook with plant-based oils, such as canola, sunflower, or olive oil. Meal planning  When eating at a restaurant, ask that your food be prepared with less salt or no salt, if possible.  Avoid foods that contain MSG (monosodium glutamate). MSG is sometimes added to [place] food, bouillon, and some canned foods. What foods are recommended? The items listed may not be a complete list. Talk with your dietitian about what dietary choices are best for you. Grains Low-sodium cereals, including oats, puffed wheat and rice, and shredded wheat. Low-sodium crackers. Unsalted rice. Unsalted pasta. Low-sodium bread. Whole-grain breads and whole-grain pasta. Vegetables Fresh or frozen vegetables. "No salt added" canned vegetables. "No salt added" tomato sauce and paste. Low-sodium or reduced-sodium tomato and vegetable juice. Fruits Fresh, frozen, or canned fruit. Fruit juice. Meats and other protein foods Fresh or frozen (no salt added) meat, poultry, seafood, and fish. Low-sodium canned tuna and salmon. Unsalted nuts. Dried peas, beans, and lentils without added salt. Unsalted canned beans. Eggs. Unsalted nut butters. Dairy Milk. Soy milk. Cheese that is naturally low in sodium, such as ricotta cheese, fresh mozzarella, or Swiss cheese Low-sodium or reduced-sodium cheese. Cream cheese. Yogurt. Fats and oils Unsalted butter. Unsalted margarine with no trans fat. Vegetable oils such as canola or  olive oils. Seasonings and other foods Fresh and dried herbs and spices. Salt-free seasonings. Low-sodium mustard and ketchup. Sodium-free salad dressing. Sodium-free light mayonnaise. Fresh or refrigerated  horseradish. Lemon juice. Vinegar. Homemade, reduced-sodium, or low-sodium soups. Unsalted popcorn and pretzels. Low-salt or salt-free chips. What foods are not recommended? The items listed may not be a complete list. Talk with your dietitian about what dietary choices are best for you. Grains Instant hot cereals. Bread stuffing, pancake, and biscuit mixes. Croutons. Seasoned rice or pasta mixes. Noodle soup cups. Boxed or frozen macaroni and cheese. Regular salted crackers. Self-rising flour. Vegetables Sauerkraut, pickled vegetables, and relishes. Olives. [place] fries. Onion rings. Regular canned vegetables (not low-sodium or reduced-sodium). Regular canned tomato sauce and paste (not low-sodium or reduced-sodium). Regular tomato and vegetable juice (not low-sodium or reduced-sodium). Frozen vegetables in sauces. Meats and other protein foods Meat or fish that is salted, canned, smoked, spiced, or pickled. Bacon, ham, sausage, hotdogs, corned beef, chipped beef, packaged lunch meats, salt pork, jerky, pickled herring, anchovies, regular canned tuna, sardines, salted nuts. Dairy Processed cheese and cheese spreads. Cheese curds. Blue cheese. Feta cheese. String cheese. Regular cottage cheese. Buttermilk. Canned milk. Fats and oils Salted butter. Regular margarine. Ghee. Bacon fat. Seasonings and other foods Onion salt, garlic salt, seasoned salt, table salt, and sea salt. Canned and packaged gravies. Worcestershire sauce. Tartar sauce. Barbecue sauce. Teriyaki sauce. Soy sauce, including reduced-sodium. Steak sauce. Fish sauce. Oyster sauce. Cocktail sauce. Horseradish that you find on the shelf. Regular ketchup and mustard. Meat flavorings and tenderizers. Bouillon cubes. Hot sauce and Tabasco sauce. Premade or packaged marinades. Premade or packaged taco seasonings. Relishes. Regular salad dressings. Salsa. Potato and tortilla chips. Corn chips and puffs. Salted popcorn and pretzels. Canned or  dried soups. Pizza. Frozen entrees and pot pies. Summary  Eating less sodium can help lower your blood pressure, reduce swelling, and protect your heart, liver, and kidneys.  Most people on this plan should limit their sodium intake to 1,500-2,000 mg (milligrams) of sodium each day.  Canned, boxed, and frozen foods are high in sodium. Restaurant foods, fast foods, and pizza are also very high in sodium. You also get sodium by adding salt to food.  Try to cook at home, eat more fresh fruits and vegetables, and eat less fast food, canned, processed, or prepared foods. This information is not intended to replace advice given to you by your health care provider. Make sure you discuss any questions you have with your health care provider. Document Released: 03/14/2002 Document Revised: 09/15/2016 Document Reviewed: 09/15/2016 Elsevier Interactive Patient Education  [employer].    If you need a refill on your cardiac medications before your next appointment, please call your pharmacy.

## 2018-02-10 NOTE — Progress Notes (Signed)
Cardiology Office Note:    Date:  02/10/2018   ID:  [name] [name], DOB 08-21-1934, MRN [id number]  PCP:  Arlyss Queen, MD  Cardiologist:  Yvonne Kendall, MD  Referring MD: Arlyss Queen, MD   Chief Complaint  Patient presents with  . Hospitalization Follow-up    01/28/18, pt states she no longer has the symptoms, no SOB, chest pain. pt does mention swelling in legs/hands    History of Present Illness:    [name] [name] is a 84 y.o. female with a past medical history significant for CAD status post MI in 2008 with PCI/DES to the LAD.  She also had 80% stenosis of the first OM, 70% distal circumflex stenosis and 50% proximal RCA stenosis.  She initially had a depressed LVEF after her MI but this normalized by echo in 2009.  Echocardiogram in 2011 showed preserved LV function with mild diastolic dysfunction.  An exercise Myoview in 04/2014 showed no ischemia or infarction.  She had a left total hip replacement without complication and 05/2014.  She also has a history of hypertension and hyperlipidemia.  In 2017 she underwent additional cardiac work-up for dyspnea.  2D echocardiogram on 01/10/2016 showed normal LV EF at 60-65%, normal wall motion and grade 1 diastolic dysfunction.  Trivial MR was noted.  A nuclear stress test in 01/2016 was low risk, negative for ischemia.  She has had issues with bradycardia on beta-blocker therapy which resolved after the beta-blocker was discontinued.  She was recently seen at [facility] in [place] after calling EMS for a nonspecific feeling of being unwell with nausea and diaphoresis.  At the hospital she had denied any present or recent chest pain or dyspnea.  EKG showed T wave inversions in V2-V6, similar to prior.  Work-up was notable for lactate 2.6, troponin 0.111, creatinine 1.12 (she appeared dry).  CBC was normal.  Given the nonspecific presentation and lactate elevation, she was treated by the hospitalist service and  advised to follow-up with cardiology as an outpatient. She reports that she was treated with IV fluids and potassium.   [name] is here today Alone for follow up of Near Syncope. She has chronic ankle edema for many years, no change recently. She was weak at first after the hospitalization, but feels like she has gotten her strength back. She denies chest pain/pressure or tightness, dyspnea, orthopnea, palpitations, lightheadedness or syncope/near syncope. She has started walking about 15 minutes per day without any exertional shortness of breath or chest discomfort and she feels that she is getting stronger. She walks with a cane.   Past Medical History:  Diagnosis Date  . Ankle edema    pt lasix and hctz daily  . Arthritis    right hip  . Bronchitis    in Dec  . CAD (coronary artery disease)    acute MI in 3/08 - taxus stent  . Carotid stenosis    mild  . Cataract    left;not ready to come off  . Constipation    takes OTC stool softener as needed  . Diabetes mellitus    pt doesn't take any meds for it  . DJD (degenerative joint disease) of hip    right  . Dry cough   . GERD (gastroesophageal reflux disease)   . Headache(784.0)    occasionally  . HTN (hypertension)    takes Amlodpine and Carvedilol daily  . Hx of cardiovascular stress test    Lexiscan Myoview (04/2014):  No ischemia, EF 64%, Low Risk  . Hypercholesteremia    takes Crestor daily  . Hypothyroidism    takes Synthroid daily  . Ileus, postoperative (HCC) 05/12/2014  . Myocardial infarction [facility])    pt unsure but thinks about 69yrs ago;is taking Plavix daily   . Nocturia   . Osteoarthritis of left hip 05/09/2014  . Primary osteoarthritis of right hip 10/27/2011    Past Surgical History:  Procedure Laterality Date  . ABDOMINAL HYSTERECTOMY  20+yrs  . BACK SURGERY  15+yrs ago  . CARDIAC CATHETERIZATION  2008  . CORONARY ARTERY BYPASS GRAFT    . LYMPH NODE DISSECTION  20+yrs ago   right   . TOTAL HIP  ARTHROPLASTY  10/27/2011   Procedure: TOTAL HIP ARTHROPLASTY;  Surgeon: Eulas Post, MD;  Location: [facility] OR;  Service: Orthopedics;  Laterality: Right;  . TOTAL HIP ARTHROPLASTY Left 05/09/2014   Procedure: LEFT TOTAL HIP ARTHROPLASTY;  Surgeon: Eulas Post, MD;  Location: [facility] OR;  Service: Orthopedics;  Laterality: Left;    Current Medications: Current Meds  Medication Sig  . aspirin 81 MG tablet Take 81 mg by mouth daily.  . clopidogrel (PLAVIX) 75 MG tablet Take 75 mg by mouth daily.  . enalapril (VASOTEC) 20 MG tablet Take 1 tablet (20 mg total) by mouth 2 (two) times daily.  . furosemide (LASIX) 40 MG tablet Take 20-40 mg by mouth 2 (two) times daily. Take 1 tablet in the morning Take 0.5 tablet every evening  . isosorbide mononitrate (IMDUR) 30 MG 24 hr tablet Take 1 tablet (30 mg total) by mouth daily.  Marland Kitchen levothyroxine (SYNTHROID, LEVOTHROID) 100 MCG tablet Take 100 mcg by mouth daily.    . nitroGLYCERIN (NITROSTAT) 0.4 MG SL tablet Place 0.4 mg under the tongue every 5 (five) minutes as needed for chest pain.  . potassium chloride SA (K-DUR,KLOR-CON) 20 MEQ tablet Take 20 mEq by mouth daily.  . rosuvastatin (CRESTOR) 40 MG tablet Take 1 tablet (40 mg total) by mouth daily.  Marland Kitchen spironolactone (ALDACTONE) 25 MG tablet Take 25 mg by mouth daily.  .  hydrochlorothiazide (HYDRODIURIL) 25 MG tablet Take 1 tablet (25 mg total) by mouth daily.     Allergies:   Patient has no known allergies.   Social History   Socioeconomic History  . Marital status: Widowed    Spouse name: Not on file  . Number of children: Not on file  . Years of education: Not on file  . Highest education level: Not on file  Occupational History  . Not on file  Social Needs  . Financial resource strain: Not on file  . Food insecurity:    Worry: Not on file    Inability: Not on file  . Transportation needs:    Medical: Not on file    Non-medical: Not on file  Tobacco Use  . Smoking status: Never Smoker    . Smokeless tobacco: Never Used  Substance and Sexual Activity  . Alcohol use: No  . Drug use: No  . Sexual activity: Never    Birth control/protection: Surgical  Lifestyle  . Physical activity:    Days per week: Not on file    Minutes per session: Not on file  . Stress: Not on file  Relationships  . Social connections:    Talks on phone: Not on file    Gets together: Not on file    Attends religious service: Not on file    Active member of  club or organization: Not on file    Attends meetings of clubs or organizations: Not on file    Relationship status: Not on file  Other Topics Concern  . Not on file  Social History Narrative  . Not on file     Family History: The patient's family history includes Diabetes in her brother; Heart attack in her brother and sister; Hyperlipidemia in her sister; Hypertension in her brother, daughter, father, mother, sister, and son; Stroke in her mother. There is no history of Anesthesia problems, Hypotension, Malignant hyperthermia, or Pseudochol deficiency. ROS:   Please see the history of present illness.     All other systems reviewed and are negative.  EKGs/Labs/Other Studies Reviewed:    The following studies were reviewed today:  Echocardiogram 01/10/2016 Study Conclusions - Left ventricle: The cavity size was normal. There was mild focal   basal hypertrophy of the septum. Systolic function was normal.   The estimated ejection fraction was in the range of 60% to 65%.   Wall motion was normal; there were no regional wall motion   abnormalities. There was an increased relative contribution of   atrial contraction to ventricular filling. Doppler parameters are   consistent with abnormal left ventricular relaxation (grade 1   diastolic dysfunction). Doppler parameters are consistent with   high ventricular filling pressure. - Mitral valve: Minimal focal calcification of the anterior leaflet   (medial segment(s)). There was trivial  regurgitation. - Pulmonic valve: There was no regurgitation.  Lexiscan Myoview 01/10/2016  Nuclear stress EF: 69%.  This is a low risk study.  The left ventricular ejection fraction is hyperdynamic (>65%).  There was no ST segment deviation noted during stress.  The study is normal.   Small region of mid anteroseptal thinning that improves consistent with samll region of ischemia; cannot exclude shifting soft tissue  Mild apical thinning that improves but does not appear significant for ischemia  LVEF normal  Ovreall low risk scan    EKG:  EKG is  ordered today.    Recent Labs: 10/09/2017: ALT 14; BUN 9; Creatinine, Ser 0.77; Hemoglobin 13.0; Platelets 273; Potassium 3.3; Sodium 142   Recent Lipid Panel    Component Value Date/Time   CHOL 204 (H) 11/25/2016 1040   TRIG 114 11/25/2016 1040   HDL 51 11/25/2016 1040   CHOLHDL 4.0 11/25/2016 1040   CHOLHDL 2.5 01/10/2016 0842   VLDL 13 01/10/2016 0842   LDLCALC 130 (H) 11/25/2016 1040    Physical Exam:    VS:  BP (!) 160/82 (BP Location: Left Arm)   Pulse 88   Ht 5\' 4"  (1.626 m)   Wt 146 lb 4 oz (66.3 kg)   BMI 25.10 kg/m     Wt Readings from Last 3 Encounters:  02/10/18 146 lb 4 oz (66.3 kg)  10/09/17 148 lb 6.4 oz (67.3 kg)  01/20/17 145 lb (65.8 kg)     Physical Exam  Constitutional: She is oriented to person, place, and time. She appears well-developed and well-nourished.  Elderly female, walking with a cane  HENT:  Head: Normocephalic and atraumatic.  Poor dentition  Neck: Normal range of motion. Neck supple. No JVD present.  Cardiovascular: Normal rate, regular rhythm, normal heart sounds and intact distal pulses. Exam reveals no gallop and no friction rub.  No murmur heard. Pulmonary/Chest: Effort normal and breath sounds normal. No respiratory distress. She has no wheezes. She has no rales.  Abdominal: Soft. Bowel sounds are normal.  Musculoskeletal: She  exhibits edema.  Trace ankle edema  Neurological:  She is alert and oriented to person, place, and time.  Skin: Skin is warm and dry.  Psychiatric: She has a normal mood and affect. Her behavior is normal. Thought content normal.     ASSESSMENT:    1. Atherosclerosis of native coronary artery of native heart without angina pectoris   2. Essential hypertension   3. Mixed hyperlipidemia    PLAN:    In order of problems listed above:  Near syncope: She had a near syncopal spell in April and was treated with IV fluids and potassium at [facility]. Her labs indicated that she was dry. She has had no recurrences of this. Currently symptomatic. She takes 3 different diuretic meds for hypertension. I will discontinue the HCTZ and continue lasix and spironolactone and add amlodipine for elevated BP.  I will check BMet today.   CAD: Status post MI in 2008 with DES to the LAD with other moderate non-obstructive CAD. Normal nuclear stress test in 01/2016. She has had no chest discomfort or dyspnea. No exertional symptoms.  Continue on Aspirin 81 mg, Plavix 75 mg, Imdur 30 mg, statin.  Not on beta-blocker due to history of bradycardia while on beta-blocker.  Hypertension: Home meds include Enalapril 20 mg bid, hydrochlorothiazide 25 mg daily, furosemide 40 mg in the morning and 20 mg in the evening, Imdur 30 mg daily, Spironolactone 25 mg daily. Due to her recent near syncope requiring IV fluids, will discontinue HCTZ and add amlodipine for elevated BP. She wants to follow up on this with her PCP as it is closer to her home. Hopefully she will tolerate this. It may increase her long standing ankle edema. I have told her to call if this occurs. We could increase the lasix to 40 mg bid, but with her recent near syncope thought to be related to dehydration, I am not doing this now. If she does not tolerate this change or have adequate BP control, will have her seen in the hypertension clinic.  Advised on low sodium diet.   Hyperlipidemia: On  Rosuvastatin 40 mg daily. Last LDL was 130 in 11/2016. Goal LDL is <70 in setting of known CAD. Will check today.   Medication Adjustments/Labs and Tests Ordered: Current medicines are reviewed at length with the patient today.  Concerns regarding medicines are outlined above. Labs and tests ordered and medication changes are outlined in the patient instructions below:  Patient Instructions  Medication Instructions:  Your physician has recommended you make the following change in your medication:   1. STOP: Hydrochlorothiazide  2. START: amlodipine 5 mg tablet: Take 1 tablet by mouth daily  Labwork: TODAY: BMET, LIPIDS  Testing/Procedures: None ordered  Follow-Up: Your physician wants you to follow-up in: 6 months with Dr. Okey Dupre You will receive a reminder letter in the mail two months in advance. If you don't receive a letter, please call our office to schedule the follow-up appointment.   Any Other Special Instructions Will Be Listed Below (If Applicable).   Low-Sodium Eating Plan Sodium, which is an element that makes up salt, helps you maintain a healthy balance of fluids in your body. Too much sodium can increase your blood pressure and cause fluid and waste to be held in your body. Your health care provider or dietitian may recommend following this plan if you have high blood pressure (hypertension), kidney disease, liver disease, or heart failure. Eating less sodium can help lower your blood pressure,  reduce swelling, and protect your heart, liver, and kidneys. What are tips for following this plan? General guidelines  Most people on this plan should limit their sodium intake to 1,500-2,000 mg (milligrams) of sodium each day. Reading food labels  The Nutrition Facts label lists the amount of sodium in one serving of the food. If you eat more than one serving, you must multiply the listed amount of sodium by the number of servings.  Choose foods with less than 140 mg of sodium  per serving.  Avoid foods with 300 mg of sodium or more per serving. Shopping  Look for lower-sodium products, often labeled as "low-sodium" or "no salt added."  Always check the sodium content even if foods are labeled as "unsalted" or "no salt added".  Buy fresh foods. ? Avoid canned foods and premade or frozen meals. ? Avoid canned, cured, or processed meats  Buy breads that have less than 80 mg of sodium per slice. Cooking  Eat more home-cooked food and less restaurant, buffet, and fast food.  Avoid adding salt when cooking. Use salt-free seasonings or herbs instead of table salt or sea salt. Check with your health care provider or pharmacist before using salt substitutes.  Cook with plant-based oils, such as canola, sunflower, or olive oil. Meal planning  When eating at a restaurant, ask that your food be prepared with less salt or no salt, if possible.  Avoid foods that contain MSG (monosodium glutamate). MSG is sometimes added to [place] food, bouillon, and some canned foods. What foods are recommended? The items listed may not be a complete list. Talk with your dietitian about what dietary choices are best for you. Grains Low-sodium cereals, including oats, puffed wheat and rice, and shredded wheat. Low-sodium crackers. Unsalted rice. Unsalted pasta. Low-sodium bread. Whole-grain breads and whole-grain pasta. Vegetables Fresh or frozen vegetables. "No salt added" canned vegetables. "No salt added" tomato sauce and paste. Low-sodium or reduced-sodium tomato and vegetable juice. Fruits Fresh, frozen, or canned fruit. Fruit juice. Meats and other protein foods Fresh or frozen (no salt added) meat, poultry, seafood, and fish. Low-sodium canned tuna and salmon. Unsalted nuts. Dried peas, beans, and lentils without added salt. Unsalted canned beans. Eggs. Unsalted nut butters. Dairy Milk. Soy milk. Cheese that is naturally low in sodium, such as ricotta cheese, fresh  mozzarella, or Swiss cheese Low-sodium or reduced-sodium cheese. Cream cheese. Yogurt. Fats and oils Unsalted butter. Unsalted margarine with no trans fat. Vegetable oils such as canola or olive oils. Seasonings and other foods Fresh and dried herbs and spices. Salt-free seasonings. Low-sodium mustard and ketchup. Sodium-free salad dressing. Sodium-free light mayonnaise. Fresh or refrigerated horseradish. Lemon juice. Vinegar. Homemade, reduced-sodium, or low-sodium soups. Unsalted popcorn and pretzels. Low-salt or salt-free chips. What foods are not recommended? The items listed may not be a complete list. Talk with your dietitian about what dietary choices are best for you. Grains Instant hot cereals. Bread stuffing, pancake, and biscuit mixes. Croutons. Seasoned rice or pasta mixes. Noodle soup cups. Boxed or frozen macaroni and cheese. Regular salted crackers. Self-rising flour. Vegetables Sauerkraut, pickled vegetables, and relishes. Olives. [place] fries. Onion rings. Regular canned vegetables (not low-sodium or reduced-sodium). Regular canned tomato sauce and paste (not low-sodium or reduced-sodium). Regular tomato and vegetable juice (not low-sodium or reduced-sodium). Frozen vegetables in sauces. Meats and other protein foods Meat or fish that is salted, canned, smoked, spiced, or pickled. Bacon, ham, sausage, hotdogs, corned beef, chipped beef, packaged lunch meats, salt pork, jerky, pickled herring,  anchovies, regular canned tuna, sardines, salted nuts. Dairy Processed cheese and cheese spreads. Cheese curds. Blue cheese. Feta cheese. String cheese. Regular cottage cheese. Buttermilk. Canned milk. Fats and oils Salted butter. Regular margarine. Ghee. Bacon fat. Seasonings and other foods Onion salt, garlic salt, seasoned salt, table salt, and sea salt. Canned and packaged gravies. Worcestershire sauce. Tartar sauce. Barbecue sauce. Teriyaki sauce. Soy sauce, including reduced-sodium.  Steak sauce. Fish sauce. Oyster sauce. Cocktail sauce. Horseradish that you find on the shelf. Regular ketchup and mustard. Meat flavorings and tenderizers. Bouillon cubes. Hot sauce and Tabasco sauce. Premade or packaged marinades. Premade or packaged taco seasonings. Relishes. Regular salad dressings. Salsa. Potato and tortilla chips. Corn chips and puffs. Salted popcorn and pretzels. Canned or dried soups. Pizza. Frozen entrees and pot pies. Summary  Eating less sodium can help lower your blood pressure, reduce swelling, and protect your heart, liver, and kidneys.  Most people on this plan should limit their sodium intake to 1,500-2,000 mg (milligrams) of sodium each day.  Canned, boxed, and frozen foods are high in sodium. Restaurant foods, fast foods, and pizza are also very high in sodium. You also get sodium by adding salt to food.  Try to cook at home, eat more fresh fruits and vegetables, and eat less fast food, canned, processed, or prepared foods. This information is not intended to replace advice given to you by your health care provider. Make sure you discuss any questions you have with your health care provider. Document Released: 03/14/2002 Document Revised: 09/15/2016 Document Reviewed: 09/15/2016 Elsevier Interactive Patient Education  [employer].    If you need a refill on your cardiac medications before your next appointment, please call your pharmacy.      Signed, Berton Bon, NP  02/10/2018 1:45 PM     [facility]

## 2018-02-11 LAB — BASIC METABOLIC PANEL
BUN / CREAT RATIO: 11 — AB (ref 12–28)
BUN: 9 mg/dL (ref 8–27)
CHLORIDE: 104 mmol/L (ref 96–106)
CO2: 24 mmol/L (ref 20–29)
Calcium: 9.3 mg/dL (ref 8.7–10.3)
Creatinine, Ser: 0.83 mg/dL (ref 0.57–1.00)
GFR calc Af Amer: 76 mL/min/{1.73_m2} (ref >59)
GFR calc non Af Amer: 66 mL/min/{1.73_m2} (ref >59)
GLUCOSE: 123 mg/dL — AB (ref 65–99)
POTASSIUM: 3.9 mmol/L (ref 3.5–5.2)
Sodium: 143 mmol/L (ref 134–144)

## 2018-02-11 LAB — LIPID PANEL
CHOLESTEROL TOTAL: 199 mg/dL (ref 100–199)
Chol/HDL Ratio: 4.1 ratio (ref 0.0–4.4)
HDL: 48 mg/dL (ref >39)
LDL Calculated: 127 mg/dL — ABNORMAL HIGH (ref 0–99)
Triglycerides: 122 mg/dL (ref 0–149)
VLDL Cholesterol Cal: 24 mg/dL (ref 5–40)

## 2018-02-15 ENCOUNTER — Ambulatory Visit: Payer: Medicare Other | Admitting: Cardiology

## 2018-02-19 ENCOUNTER — Telehealth: Payer: Self-pay

## 2018-02-19 NOTE — Telephone Encounter (Signed)
Tried patient number but says phone has restrictions, called Juanita Laster on her emergency contact but he says I would need to call Micah Flesher but her number was disconnected. Called Tamra Koos again and he said he would try to call me back with another number to reach her.

## 2018-02-23 ENCOUNTER — Telehealth: Payer: Self-pay | Admitting: Cardiology

## 2018-02-23 DIAGNOSIS — E782 Mixed hyperlipidemia: Secondary | ICD-10-CM

## 2018-02-23 MED ORDER — EZETIMIBE 10 MG PO TABS: 10.0000 mg | ORAL_TABLET | Freq: Every day | ORAL | 3 refills | Status: DC

## 2018-02-23 NOTE — Telephone Encounter (Signed)
New Message  Pt is returning a call for Ann about labs, provided a good call back number. Please call

## 2018-02-23 NOTE — Telephone Encounter (Signed)
Finally spoke with the patient and advised her that her LDL was still elevated. She reports that she is still taking her crestor every day. Per Berton Bon NP pt advised that we are adding Zetia  every day to help bring down her LDL and to continue to modify her diet. New Rx sent to her pharmacy. Pt says in the future it is best to reach her by calling her son [name] [name] whom is listed on her Demographic information.

## 2018-02-23 NOTE — Telephone Encounter (Signed)
Pt returning your call

## 2018-02-23 NOTE — Telephone Encounter (Signed)
Tried new number provided [phone] but not able to get through. Will take call when she calls back instead having a message sent back again since every number provided does not accept incoming calls.

## 2018-03-17 ENCOUNTER — Other Ambulatory Visit: Payer: Self-pay | Admitting: Nurse Practitioner

## 2018-03-17 ENCOUNTER — Other Ambulatory Visit: Payer: Self-pay | Admitting: Cardiology

## 2018-03-26 ENCOUNTER — Other Ambulatory Visit: Payer: Self-pay | Admitting: Family Medicine

## 2018-03-26 DIAGNOSIS — Z1231 Encounter for screening mammogram for malignant neoplasm of breast: Secondary | ICD-10-CM

## 2018-04-23 ENCOUNTER — Telehealth: Payer: Self-pay | Admitting: *Deleted

## 2018-04-23 NOTE — Telephone Encounter (Signed)
   [facility] Pre-operative Risk Assessment    Request for surgical clearance:  1. What type of surgery is being performed? Full mouth extraction   2. When is this surgery scheduled? TBD   3. What type of clearance is required (medical clearance vs. Pharmacy clearance to hold med vs. Both)? medical  4. Are there any medications that need to be held prior to surgery and how long?not noted; pt on asa/plavix  5. Practice name and name of physician performing surgery? [facility]   6. What is your office phone number - [phone]   7.   What is your office fax number [phone]  8.   Anesthesia type (None, local, MAC, general) ? IV sedation   Rodman Key 04/23/2018, 12:58 PM  _________________________________________________________________   (provider comments below)

## 2018-04-28 NOTE — Telephone Encounter (Signed)
It has been a couple of months since she has been seen so I called the son to make sure the patient was doing okay.  He stated the dental work is already been done and she tolerated it well.  He wondered when she was supposed to follow-up with cardiology.  She has a recall in for an appointment with Dr. Okey DupreEnd in November.  I advised that I would route this message so that the appointment could be made and they will call him with it.  Any communication with the patient, please go through her son, Simona Huharl.  Theodore DemarkRhonda Rob Mciver, PA-C 04/28/2018 2:41 PM Beeper [phone]

## 2018-04-29 ENCOUNTER — Telehealth: Payer: Self-pay

## 2018-04-29 NOTE — Telephone Encounter (Signed)
[facility] to contact patient and schedule follow up with Dr End in November.

## 2018-04-29 NOTE — Telephone Encounter (Signed)
   [facility] Pre-operative Risk Assessment    Request for surgical clearance:  1. What type of surgery is being performed? Full mouth extraction   2. When is this surgery scheduled? TBD   3. What type of clearance is required (medical clearance vs. Pharmacy clearance to hold med vs. Both)? medical  4. Are there any medications that need to be held prior to surgery and how long?   5. Practice name and name of physician performing surgery? [facility]   6. What is your office phone [phone]    7.   What is your office fax [phone]  8.   Anesthesia type (None, local, MAC, general) ? local   Frederik Schmidt 04/29/2018, 11:46 AM  _________________________________________________________________   (provider comments below)

## 2018-04-30 NOTE — Telephone Encounter (Signed)
According to Bjorn LoserRhonda Barrett's note from 04/28/18 this has already been done.  Corine ShelterLUKE Darril Patriarca PA-C 04/30/2018 4:04 PM

## 2018-07-15 ENCOUNTER — Encounter: Payer: Self-pay | Admitting: Internal Medicine

## 2018-08-09 ENCOUNTER — Ambulatory Visit: Payer: Medicare Other | Admitting: Internal Medicine

## 2018-08-23 ENCOUNTER — Ambulatory Visit (INDEPENDENT_AMBULATORY_CARE_PROVIDER_SITE_OTHER): Payer: Medicare Other | Admitting: Internal Medicine

## 2018-08-23 ENCOUNTER — Encounter: Payer: Self-pay | Admitting: Internal Medicine

## 2018-08-23 VITALS — BP 172/82 | HR 71 | Ht 64.0 in | Wt 143.4 lb

## 2018-08-23 DIAGNOSIS — I255 Ischemic cardiomyopathy: Secondary | ICD-10-CM | POA: Diagnosis not present

## 2018-08-23 DIAGNOSIS — I491 Atrial premature depolarization: Secondary | ICD-10-CM

## 2018-08-23 DIAGNOSIS — E785 Hyperlipidemia, unspecified: Secondary | ICD-10-CM

## 2018-08-23 DIAGNOSIS — I10 Essential (primary) hypertension: Secondary | ICD-10-CM

## 2018-08-23 DIAGNOSIS — I25118 Atherosclerotic heart disease of native coronary artery with other forms of angina pectoris: Secondary | ICD-10-CM | POA: Diagnosis not present

## 2018-08-23 NOTE — Progress Notes (Signed)
Follow-up Outpatient Visit Date: 08/23/2018  Primary Care Provider: Arlyss Queen, MD [address] [place] [address]  Chief Complaint: Follow-up coronary artery disease  HPI:  [name] [name] is a 84 y.o. year-old female with history of coronary artery disease s/p Taxus drug-eluting stent to the proximal LAD (2008) and moderate to severe OM1, distal LCx, and mid RCA disease, ischemic cardiomyopathy with normalization of LVEF, hypertension, and hyperlipidemia, who presents for follow-up of coronary artery disease.  She was previously followed in our office by Dr. Shirlee Latch and was last seen by Lizabeth Leyden in May after being hospitalized in [place], [place], with a nonspecific unwell feeling accompanied by nausea and diaphoresis.  Workup was notable for mild troponin elevation (TnI 0.111) and T-wave inversions on EKG in the anterior leads.  At the time of her follow-up visit, she denied chest pain and shortness of breath with activity.  HCTZ was discontinued, as it was thought that being on multiple diuretics may have contributed to the symptoms that led to her hospitalization.  Amlodipine 5 mg daily was added for BP control.  Today, Ms. Cutting reports that she has been doing relatively well.  She notices occasional PND, happening about once a month.  She otherwise has not had any orthopnea or chest pain.  Stable exertional dyspnea and bilateral lower extremity edema.  Overall, she has felt this way for the last few years.  She believes that her blood pressure is elevated today because she has not taken any of her morning medications.  Blood pressure at home is typically around 130/80.  --------------------------------------------------------------------------------------------------  Past Medical History:  Diagnosis Date  . Ankle edema    pt lasix and hctz daily  . Arthritis    right hip  . Bronchitis    in Dec  . CAD (coronary artery disease)    acute MI in 3/08 - taxus stent  . Carotid  stenosis    mild  . Cataract    left;not ready to come off  . Constipation    takes OTC stool softener as needed  . Diabetes mellitus    pt doesn't take any meds for it  . DJD (degenerative joint disease) of hip    right  . Dry cough   . GERD (gastroesophageal reflux disease)   . Headache(784.0)    occasionally  . HTN (hypertension)    takes Amlodpine and Carvedilol daily  . Hx of cardiovascular stress test    Lexiscan Myoview (04/2014):  No ischemia, EF 64%, Low Risk  . Hypercholesteremia    takes Crestor daily  . Hypothyroidism    takes Synthroid daily  . Ileus, postoperative (HCC) 05/12/2014  . Myocardial infarction [facility])    pt unsure but thinks about 75yrs ago;is taking Plavix daily   . Nocturia   . Osteoarthritis of left hip 05/09/2014  . Primary osteoarthritis of right hip 10/27/2011   Past Surgical History:  Procedure Laterality Date  . ABDOMINAL HYSTERECTOMY  20+yrs  . BACK SURGERY  15+yrs ago  . CARDIAC CATHETERIZATION  2008  . CORONARY ARTERY BYPASS GRAFT    . LYMPH NODE DISSECTION  20+yrs ago   right   . TOTAL HIP ARTHROPLASTY  10/27/2011   Procedure: TOTAL HIP ARTHROPLASTY;  Surgeon: Eulas Post, MD;  Location: [facility] OR;  Service: Orthopedics;  Laterality: Right;  . TOTAL HIP ARTHROPLASTY Left 05/09/2014   Procedure: LEFT TOTAL HIP ARTHROPLASTY;  Surgeon: Eulas Post, MD;  Location: [facility] OR;  Service: Orthopedics;  Laterality: Left;     Recent CV Pertinent Labs: Lab Results  Component Value Date   CHOL 180 08/23/2018   HDL 49 08/23/2018   LDLCALC 112 (H) 08/23/2018   TRIG 95 08/23/2018   CHOLHDL 3.7 08/23/2018   CHOLHDL 2.5 01/10/2016   INR 1.00 01/21/2017   BNP 57.6 12/25/2015   K 3.9 08/23/2018   BUN 8 08/23/2018   CREATININE 0.71 08/23/2018   CREATININE 0.88 12/25/2015    Past medical and surgical history were reviewed and updated in EPIC.  Current Meds  Medication Sig  . amLODipine (NORVASC) 5 MG tablet Take 1 tablet (5 mg total) by mouth  daily.  Marland Kitchen. aspirin 81 MG tablet Take 81 mg by mouth daily.  . clopidogrel (PLAVIX) 75 MG tablet TAKE 1 TABLET BY MOUTH ONCE DAILY.  Marland Kitchen. enalapril (VASOTEC) 20 MG tablet Take 1 tablet (20 mg total) by mouth 2 (two) times daily.  . furosemide (LASIX) 40 MG tablet Take 20-40 mg by mouth 2 (two) times daily. Take 1 tablet in the morning Take 0.5 tablet every evening  . isosorbide mononitrate (IMDUR) 30 MG 24 hr tablet TAKE 1 TABLET BY MOUTH ONCE DAILY.  Marland Kitchen. levothyroxine (SYNTHROID, LEVOTHROID) 100 MCG tablet Take 100 mcg by mouth daily.    . nitroGLYCERIN (NITROSTAT) 0.4 MG SL tablet Place 0.4 mg under the tongue every 5 (five) minutes as needed for chest pain.  . potassium chloride SA (K-DUR,KLOR-CON) 20 MEQ tablet Take 20 mEq by mouth daily.  . rosuvastatin (CRESTOR) 40 MG tablet Take 1 tablet (40 mg total) by mouth daily.  Marland Kitchen. spironolactone (ALDACTONE) 25 MG tablet TAKE 1 TABLET BY MOUTH ONCE DAILY.    Allergies: Patient has no known allergies.  Social History   Tobacco Use  . Smoking status: Never Smoker  . Smokeless tobacco: Never Used  Substance Use Topics  . Alcohol use: No  . Drug use: No    Family History  Problem Relation Age of Onset  . Hypertension Mother   . Stroke Mother   . Hypertension Father   . Heart attack Brother   . Heart attack Sister   . Hypertension Sister   . Hypertension Brother   . Hypertension Daughter   . Hypertension Son   . Hyperlipidemia Sister   . Diabetes Brother   . Anesthesia problems Neg Hx   . Hypotension Neg Hx   . Malignant hyperthermia Neg Hx   . Pseudochol deficiency Neg Hx     Review of Systems: A 12-system review of systems was performed and was negative except as noted in the HPI.  --------------------------------------------------------------------------------------------------  Physical Exam: BP (!) 172/82   Pulse 71   Ht 5\' 4"  (1.626 m)   Wt 143 lb 6.4 oz (65 kg)   BMI 24.61 kg/m   General: NAD. HEENT: No conjunctival  pallor or scleral icterus. Moist mucous membranes.  OP clear. Neck: Supple without lymphadenopathy, thyromegaly, JVD, or HJR.  Lungs: Normal work of breathing. Clear to auscultation bilaterally without wheezes or crackles. Heart: Regular rate and rhythm without murmurs, rubs, or gallops. Non-displaced PMI. Abd: Bowel sounds present. Soft, NT/ND without hepatosplenomegaly Ext: Trace pretibial edema bilaterally. Skin: Warm and dry without rash.  EKG: Normal sinus rhythm is ectopic atrial pacemaker with septal Q waves.  No significant change from prior tracing 10/09/17.  Lab Results  Component Value Date   WBC 6.1 10/09/2017   HGB 13.0 10/09/2017   HCT 38.5 10/09/2017   MCV 93 10/09/2017  PLT 273 10/09/2017    Lab Results  Component Value Date   NA 141 08/23/2018   K 3.9 08/23/2018   CL 103 08/23/2018   CO2 21 08/23/2018   BUN 8 08/23/2018   CREATININE 0.71 08/23/2018   GLUCOSE 101 (H) 08/23/2018   ALT 10 08/23/2018    Lab Results  Component Value Date   CHOL 180 08/23/2018   HDL 49 08/23/2018   LDLCALC 112 (H) 08/23/2018   TRIG 95 08/23/2018   CHOLHDL 3.7 08/23/2018    --------------------------------------------------------------------------------------------------  ASSESSMENT AND PLAN: Coronary artery disease with stable angina No significant chest pain status post PCI to the proximal LAD in 2008 with a Taxus drug-eluting stent and continued antianginal therapy with amlodipine and isosorbide mononitrate.  Given that a Taxus stent was used and moderate to severe disease involving other branches was not intervened upon, we will continue with indefinite dual antiplatelet therapy.  Given stable symptoms, no further testing is indicated at this time.  Hypertension Blood pressure suboptimally controlled today, though this may have been exacerbated by the patient having missed her normal medications this morning.  I have asked her to take all of her medications and to contact  us if her blood pressure is persistently above 140/90.  Ischemic cardiomyopathy The patient has stable NYHA class II symptoms with trace edema on exam today.  He will be important to continue to control her blood pressure, as outlined above.  I will check a CMP today.  Ms. Wogan should continue her current diuretic regimen.  Ectopic atrial rhythm EKG today suspicious for an ectopic atrial rhythm.  However, multiple prior tracings have shown similar P wave morphology.  As she is asymptomatic, I will defer changes today to her medications.  Beta-blocker could be considered in the future.  I will check serum chemistries to ensure that her electrolytes are within normal limits.  Hyperlipidemia We will continue with rosuvastatin 40 mg daily and ezetimibe daily for target LDL less than 70.  We will check a lipid panel today.  If it remains elevated, lifestyle modifications and/or PCSK9 inhibitor may need to be considered in the future.  Follow-up: Given my transition to the [place] office, I will have Ms. Layfield follow-up with a provider in our [place] office in 6 months, as this is much closer to her home.  Yvonne Kendall, MD 08/24/2018 9:06 PM

## 2018-08-23 NOTE — Patient Instructions (Signed)
Medication Instructions:  TAKE Blood Pressure medications.  Check BP.  If BP is consistently above 140/90, call us.   If you need a refill on your cardiac medications before your next appointment, please call your pharmacy.   Lab work:TODAY CBC/CMP If you have labs (blood work) drawn today and your tests are completely normal, you will receive your results only by: Marland Kitchen. MyChart Message (if you have MyChart) OR . A paper copy in the mail If you have any lab test that is abnormal or we need to change your treatment, we will call you to review the results.  Testing/Procedures: NONE  Follow-Up: 6 MONTHS IN [place]  At [facility], you and your health needs are our priority.  As part of our continuing mission to provide you with exceptional heart care, we have created designated Provider Care Teams.  These Care Teams include your primary Cardiologist (physician) and Advanced Practice Providers (APPs -  Physician Assistants and Nurse Practitioners) who all work together to provide you with the care you need, when you need it. .  Any Other Special Instructions Will Be Listed Below (If Applicable).

## 2018-08-24 ENCOUNTER — Encounter: Payer: Self-pay | Admitting: Internal Medicine

## 2018-08-24 DIAGNOSIS — I491 Atrial premature depolarization: Secondary | ICD-10-CM | POA: Insufficient documentation

## 2018-08-24 DIAGNOSIS — I255 Ischemic cardiomyopathy: Secondary | ICD-10-CM | POA: Insufficient documentation

## 2018-08-24 LAB — COMPREHENSIVE METABOLIC PANEL
ALBUMIN: 4.1 g/dL (ref 3.5–4.7)
ALT: 10 IU/L (ref 0–32)
AST: 15 IU/L (ref 0–40)
Albumin/Globulin Ratio: 1.1 — ABNORMAL LOW (ref 1.2–2.2)
Alkaline Phosphatase: 89 IU/L (ref 39–117)
BUN / CREAT RATIO: 11 — AB (ref 12–28)
BUN: 8 mg/dL (ref 8–27)
Bilirubin Total: 0.5 mg/dL (ref 0.0–1.2)
CALCIUM: 9.1 mg/dL (ref 8.7–10.3)
CO2: 21 mmol/L (ref 20–29)
CREATININE: 0.71 mg/dL (ref 0.57–1.00)
Chloride: 103 mmol/L (ref 96–106)
GFR calc non Af Amer: 80 mL/min/{1.73_m2} (ref >59)
GFR, EST AFRICAN AMERICAN: 92 mL/min/{1.73_m2} (ref >59)
GLUCOSE: 101 mg/dL — AB (ref 65–99)
Globulin, Total: 3.7 g/dL (ref 1.5–4.5)
Potassium: 3.9 mmol/L (ref 3.5–5.2)
Sodium: 141 mmol/L (ref 134–144)
TOTAL PROTEIN: 7.8 g/dL (ref 6.0–8.5)

## 2018-08-24 LAB — LIPID PANEL
Chol/HDL Ratio: 3.7 ratio (ref 0.0–4.4)
Cholesterol, Total: 180 mg/dL (ref 100–199)
HDL: 49 mg/dL (ref >39)
LDL CALC: 112 mg/dL — AB (ref 0–99)
Triglycerides: 95 mg/dL (ref 0–149)
VLDL CHOLESTEROL CAL: 19 mg/dL (ref 5–40)

## 2018-08-25 ENCOUNTER — Telehealth: Payer: Self-pay

## 2018-08-25 DIAGNOSIS — E782 Mixed hyperlipidemia: Secondary | ICD-10-CM

## 2018-08-25 NOTE — Telephone Encounter (Signed)
Notes recorded by Sigurd Sosapp, Manar Smalling, RN on 08/25/2018 at 8:31 AM EST Left message for patient's son to reach mother and call us.

## 2018-08-25 NOTE — Telephone Encounter (Signed)
-----   Message from Yvonne Kendallhristopher End, MD sent at 08/25/2018  7:03 AM EST ----- Please let [name] [name] know that her kidney function, electrolytes, and liver function are normal.  Her cholesterol is not at goal, with an LDL of 112 (goal < 70).  Please confirm that she continues to take rosuvastatin 40 mg daily and ezetimibe 10 mg daily.  She should work on lifestyle modifications (exercise and dietary changes).  If LDL remains elevated, lipid clinic referral may need to be considered to discuss PCSK9 inhibitor.

## 2018-08-25 NOTE — Telephone Encounter (Signed)
-----   Message from Christopher End, MD sent at 08/25/2018  7:03 AM EST ----- Please let [name] [name] know that her kidney function, electrolytes, and liver function are normal.  Her cholesterol is not at goal, with an LDL of 112 (goal < 70).  Please confirm that she continues to take rosuvastatin 40 mg daily and ezetimibe 10 mg daily.  She should work on lifestyle modifications (exercise and dietary changes).  If LDL remains elevated, lipid clinic referral may need to be considered to discuss PCSK9 inhibitor. 

## 2018-08-25 NOTE — Telephone Encounter (Signed)
Notes recorded by Sigurd Sosapp, Sanyla Summey, RN on 08/25/2018 at 8:27 AM EST Mailbox full 11/20 ------

## 2018-08-26 MED ORDER — ROSUVASTATIN CALCIUM 40 MG PO TABS: 40.0000 mg | ORAL_TABLET | Freq: Every day | ORAL | 3 refills | Status: DC

## 2018-08-26 MED ORDER — EZETIMIBE 10 MG PO TABS: 10.0000 mg | ORAL_TABLET | Freq: Every day | ORAL | 3 refills | Status: DC

## 2018-08-26 NOTE — Telephone Encounter (Signed)
Follow up    Patient and son is returning call in reference to lab results. Please call.

## 2018-08-26 NOTE — Telephone Encounter (Signed)
Left patient's son message, because patient's VM is full.

## 2018-08-26 NOTE — Telephone Encounter (Signed)
Spoke to patient who informed me that she is not taking Zetia 10mg  or Crestor 40 mg.

## 2018-08-26 NOTE — Telephone Encounter (Signed)
Cont. I informed patient that she needs to start these medications and that I would call them into the pharmacy.  She verbalized understanding.

## 2018-10-05 ENCOUNTER — Encounter: Payer: Self-pay | Admitting: Cardiology

## 2018-10-05 ENCOUNTER — Telehealth: Payer: Self-pay

## 2018-10-05 NOTE — Telephone Encounter (Signed)
Incorrect patient

## 2018-10-05 NOTE — Telephone Encounter (Signed)
Is it okay for patient to Coricidin HBP? She has a cold

## 2018-10-05 NOTE — Telephone Encounter (Signed)
Spoke with the patient's daughter [name] [name], she stated that her mother was decreased. Called the other number on file and got her granddaughter, she stated that she was not deceased and she advised I call the other number of her son [name] [name] to get her.  Spoke with the patient she understood that she could take coricidin for cold symptoms. She expressed understanding and had no further questions.

## 2019-03-08 ENCOUNTER — Telehealth: Payer: Self-pay | Admitting: Cardiovascular Disease

## 2019-03-08 NOTE — Telephone Encounter (Signed)
Virtual Visit Pre-Appointment Phone Call  "(Name), I am calling you today to discuss your upcoming appointment. We are currently trying to limit exposure to the virus that causes COVID-19 by seeing patients at home rather than in the office."  1. "What is the BEST phone number to call the day of the visit?" - include this in appointment notes  2. Do you have or have access to (through a family member/friend) a smartphone with video capability that we can use for your visit?" a. If yes - list this number in appt notes as cell (if different from BEST phone #) and list the appointment type as a VIDEO visit in appointment notes b. If no - list the appointment type as a PHONE visit in appointment notes  3. Confirm consent - "In the setting of the current Covid19 crisis, you are scheduled for a (phone or video) visit with your provider on (date) at (time).  Just as we do with many in-office visits, in order for you to participate in this visit, we must obtain consent.  If you'd like, I can send this to your mychart (if signed up) or email for you to review.  Otherwise, I can obtain your verbal consent now.  All virtual visits are billed to your insurance company just like a normal visit would be.  By agreeing to a virtual visit, we'd like you to understand that the technology does not allow for your provider to perform an examination, and thus may limit your provider's ability to fully assess your condition. If your provider identifies any concerns that need to be evaluated in person, we will make arrangements to do so.  Finally, though the technology is pretty good, we cannot assure that it will always work on either your or our end, and in the setting of a video visit, we may have to convert it to a phone-only visit.  In either situation, we cannot ensure that we have a secure connection.  Are you willing to proceed?" STAFF: Did the patient verbally acknowledge consent to telehealth visit? Document  YES/NO here: Yes  4. Advise patient to be prepared - "Two hours prior to your appointment, go ahead and check your blood pressure, pulse, oxygen saturation, and your weight (if you have the equipment to check those) and write them all down. When your visit starts, your provider will ask you for this information. If you have an Apple Watch or Kardia device, please plan to have heart rate information ready on the day of your appointment. Please have a pen and paper handy nearby the day of the visit as well."  5. Give patient instructions for MyChart download to smartphone OR [email or web address] as below if video visit (depending on what platform provider is using)  6. Inform patient they will receive a phone call 15 minutes prior to their appointment time (may be from unknown caller ID) so they should be prepared to answer    TELEPHONE CALL NOTE  Kailia Gomez CleverlyG Leccese has been deemed a candidate for a follow-up tele-health visit to limit community exposure during the Covid-19 pandemic. I spoke with the patient via phone to ensure availability of phone/video source, confirm preferred email & phone number, and discuss instructions and expectations.  I reminded Shelle IronClara G Maj to be prepared with any vital sign and/or heart rhythm information that could potentially be obtained via home monitoring, at the time of her visit. I reminded Shelle IronClara G Hazzard to expect a phone call prior to  her visit.  Virgel Gess Goins 03/08/2019 2:57 PM

## 2019-03-10 ENCOUNTER — Encounter: Payer: Self-pay | Admitting: Cardiovascular Disease

## 2019-03-10 ENCOUNTER — Telehealth: Payer: Medicare Other | Admitting: Cardiovascular Disease

## 2019-03-10 ENCOUNTER — Other Ambulatory Visit: Payer: Self-pay

## 2019-04-13 ENCOUNTER — Telehealth: Payer: Medicare Other | Admitting: Cardiovascular Disease

## 2019-04-18 ENCOUNTER — Other Ambulatory Visit: Payer: Self-pay | Admitting: Nurse Practitioner

## 2019-04-18 ENCOUNTER — Other Ambulatory Visit: Payer: Self-pay | Admitting: Cardiology

## 2019-04-27 ENCOUNTER — Ambulatory Visit: Payer: Medicare Other | Admitting: Cardiology

## 2019-05-11 ENCOUNTER — Other Ambulatory Visit: Payer: Self-pay | Admitting: Nurse Practitioner

## 2019-05-11 ENCOUNTER — Other Ambulatory Visit: Payer: Self-pay | Admitting: Cardiology

## 2019-05-17 ENCOUNTER — Encounter: Payer: Self-pay | Admitting: Cardiology

## 2019-05-17 ENCOUNTER — Other Ambulatory Visit: Payer: Self-pay

## 2019-05-17 ENCOUNTER — Ambulatory Visit (INDEPENDENT_AMBULATORY_CARE_PROVIDER_SITE_OTHER): Payer: Medicare Other | Admitting: Cardiology

## 2019-05-17 VITALS — BP 162/92 | HR 81 | Temp 99.1°F | Ht <= 58 in | Wt 140.0 lb

## 2019-05-17 DIAGNOSIS — E785 Hyperlipidemia, unspecified: Secondary | ICD-10-CM | POA: Diagnosis not present

## 2019-05-17 DIAGNOSIS — I2510 Atherosclerotic heart disease of native coronary artery without angina pectoris: Secondary | ICD-10-CM | POA: Diagnosis not present

## 2019-05-17 DIAGNOSIS — I10 Essential (primary) hypertension: Secondary | ICD-10-CM | POA: Diagnosis not present

## 2019-05-17 MED ORDER — NITROGLYCERIN 0.4 MG SL SUBL: 0.4000 mg | SUBLINGUAL_TABLET | SUBLINGUAL | 3 refills | Status: DC | PRN

## 2019-05-17 MED ORDER — ENALAPRIL MALEATE 20 MG PO TABS: 20.0000 mg | ORAL_TABLET | Freq: Every day | ORAL | 6 refills | Status: DC

## 2019-05-17 NOTE — Patient Instructions (Addendum)
Medication Instructions:   Resume Enalapril 20mg  daily.   New prescription sent to pharmacy now.   Continue all other medications.    Labwork:  BMET, Lipids - orders given today.   Reminder:  Nothing to eat or drink after 12 midnight prior to labs.  Office will contact with results via phone or letter.    Testing/Procedures: none  Follow-Up: 3 months  Any Other Special Instructions Will Be Listed Below (If Applicable).  If you need a refill on your cardiac medications before your next appointment, please call your pharmacy.  -------------------------------------------------------------------  Lifestyle Modifications to Prevent and Treat Heart Disease -Recommend heart healthy/Mediterranean diet, with whole grains, fruits, vegetables, fish, lean meats, nuts, olive oil and avocado oil.  -Limit salt intake to less than 1500 mg per day.  -Recommend moderate walking, starting slowly with a few minutes and working up to 3-5 times/week for 30-50 minutes each session. Aim for at least 150 minutes.week. Goal should be pace of 3 miles/hours, or walking 1.5 miles in 30 minutes -Recommend avoidance of tobacco products. Avoid excess alcohol. -Keep blood pressure well controlled, ideally less than 130/80.

## 2019-05-17 NOTE — Progress Notes (Signed)
Cardiology Office Note:    Date:  05/17/2019   ID:  [name] [name], DOB 12/13/1933, MRN [id number]  PCP:  Arlyss QueenSelvidge, William M, MD  Cardiologist:  Yvonne Kendallhristopher End, MD  Will now be seen in [place]  Referring MD: Arlyss QueenSelvidge, William M, MD   Chief Complaint  Patient presents with  . Follow-up  . Coronary Artery Disease  . Hypertension  . Hyperlipidemia    History of Present Illness:    [name] [name] is a 85 y.o. female with a past medical history significant for  CAD status post MI in 2008 with PCI/DES to the LAD.  She also had 80% stenosis of the first OM, 70% distal circumflex stenosis and 50% proximal RCA stenosis.  She initially had a depressed LVEF after her MI but this normalized by echo in 2009.  Echocardiogram in 2011 showed preserved LV function with mild diastolic dysfunction.  An exercise Myoview in 04/2014 showed no ischemia or infarction.  She had a left total hip replacement without complication and 05/2014.  She also has a history of hypertension and hyperlipidemia.  In 2017 she underwent additional cardiac work-up for dyspnea.  2D echocardiogram on 01/10/2016 showed normal LV EF at 60-65%, normal wall motion and grade 1 diastolic dysfunction.  Trivial MR was noted.  A nuclear stress test in 01/2016 was low risk, negative for ischemia.  She has had issues with bradycardia on beta-blocker therapy which resolved after the beta-blocker was discontinued.  I saw her in the [place] office on 02/10/2018 for near syncope after an ED visit in 01/2018 in [place] for weakness during which she ruled out for MI.  This was felt to be related to dehydration.  At the follow-up visit with me she reported that she was gradually getting her strength back and was walking 15 minutes a day without exertional symptoms.  She walked with a cane.  She had been taking 3 different diuretic medications for hypertension.  I discontinued the hydrochlorothiazide and continue Lasix and Spironolactone.  I added amlodipine  for blood pressure.  I also added Zetia for elevated LDL.  Follow-up labs on 08/23/2018 showed normal creatinine of 0.71 and potassium 3.9.  [name] [name] is here today for annual follow-up. She lives with her daughter. She walks about 3 times per week in her yard about 10 minutes. She does some housework, mopping, cooking. She does some yard work, chopping weeds. She denies any chest pain. She only has mild DOE with more strenuous exertion. She feels like she is doing well "for an old lady". No orthopnea, PND, edema. She has occ lightheadedness when she first gets up. No syncope or falls.   She says that at some point her Lasix and enalapril were discontinued.  She says that this was not done at an office visit but she was told over the phone that she was to stop these.  She says that her blood pressure does run high at home although she cannot tell me what the readings are.  Past Medical History:  Diagnosis Date  . Ankle edema   . Arthritis    right hip  . Bronchitis    in Dec  . CAD (coronary artery disease)    acute MI in 3/08 - taxus stent  . Carotid stenosis    mild  . Cataract    left;not ready to come off  . Constipation    takes OTC stool softener as needed  . Diabetes mellitus    pt doesn't take  any meds for it  . DJD (degenerative joint disease) of hip    right  . Dry cough   . GERD (gastroesophageal reflux disease)   . Headache(784.0)    occasionally  . HTN (hypertension)    takes Amlodpine and Carvedilol daily  . Hx of cardiovascular stress test    Lexiscan Myoview (04/2014):  No ischemia, EF 64%, Low Risk  . Hypercholesteremia    takes Crestor daily  . Hypothyroidism    takes Synthroid daily  . Ileus, postoperative (HCC) 05/12/2014  . Myocardial infarction [facility](HCC)    pt unsure but thinks about 6082yrs ago;is taking Plavix daily   . Nocturia   . Osteoarthritis of left hip 05/09/2014  . Primary osteoarthritis of right hip 10/27/2011    Past Surgical History:  Procedure  Laterality Date  . ABDOMINAL HYSTERECTOMY  20+yrs  . BACK SURGERY  15+yrs ago  . CARDIAC CATHETERIZATION  2008  . CORONARY ARTERY BYPASS GRAFT    . LYMPH NODE DISSECTION  20+yrs ago   right   . TOTAL HIP ARTHROPLASTY  10/27/2011   Procedure: TOTAL HIP ARTHROPLASTY;  Surgeon: Eulas PostJoshua P Landau, MD;  Location: [facility] OR;  Service: Orthopedics;  Laterality: Right;  . TOTAL HIP ARTHROPLASTY Left 05/09/2014   Procedure: LEFT TOTAL HIP ARTHROPLASTY;  Surgeon: Eulas PostJoshua P Landau, MD;  Location: [facility] OR;  Service: Orthopedics;  Laterality: Left;    Current Medications: Current Meds  Medication Sig  . amLODipine (NORVASC) 5 MG tablet Take 1 tablet (5 mg total) by mouth daily.  Marland Kitchen. aspirin 81 MG tablet Take 81 mg by mouth daily.  . clopidogrel (PLAVIX) 75 MG tablet TAKE 1 TABLET BY MOUTH ONCE DAILY.  Marland Kitchen. ezetimibe (ZETIA) 10 MG tablet Take 1 tablet (10 mg total) by mouth daily.  . isosorbide mononitrate (IMDUR) 30 MG 24 hr tablet Take 1 tablet (30 mg total) by mouth daily. Pt must keep upcoming appt in august in order to receive further refills  . levothyroxine (SYNTHROID, LEVOTHROID) 100 MCG tablet Take 100 mcg by mouth daily.    . nitroGLYCERIN (NITROSTAT) 0.4 MG SL tablet Place 0.4 mg under the tongue every 5 (five) minutes as needed for chest pain.  . rosuvastatin (CRESTOR) 40 MG tablet Take 1 tablet (40 mg total) by mouth daily.  Marland Kitchen. spironolactone (ALDACTONE) 25 MG tablet TAKE 1 TABLET BY MOUTH ONCE DAILY.     Allergies:   Patient has no known allergies.   Social History   Socioeconomic History  . Marital status: Widowed    Spouse name: Not on file  . Number of children: Not on file  . Years of education: Not on file  . Highest education level: Not on file  Occupational History  . Not on file  Social Needs  . Financial resource strain: Not on file  . Food insecurity    Worry: Not on file    Inability: Not on file  . Transportation needs    Medical: Not on file    Non-medical: Not on file   Tobacco Use  . Smoking status: Never Smoker  . Smokeless tobacco: Never Used  Substance and Sexual Activity  . Alcohol use: No  . Drug use: No  . Sexual activity: Never    Birth control/protection: Surgical  Lifestyle  . Physical activity    Days per week: Not on file    Minutes per session: Not on file  . Stress: Not on file  Relationships  . Social connections  Talks on phone: Not on file    Gets together: Not on file    Attends religious service: Not on file    Active member of club or organization: Not on file    Attends meetings of clubs or organizations: Not on file    Relationship status: Not on file  Other Topics Concern  . Not on file  Social History Narrative  . Not on file     Family History: The patient's family history includes Diabetes in her brother; Heart attack in her brother and sister; Hyperlipidemia in her sister; Hypertension in her brother, daughter, father, mother, sister, and son; Stroke in her mother. There is no history of Anesthesia problems, Hypotension, Malignant hyperthermia, or Pseudochol deficiency. ROS:   Please see the history of present illness.     All other systems reviewed and are negative.  EKGs/Labs/Other Studies Reviewed:    The following studies were reviewed today:  Echocardiogram 01/10/2016 Study Conclusions - Left ventricle: The cavity size was normal. There was mild focal basal hypertrophy of the septum. Systolic function was normal. The estimated ejection fraction was in the range of 60% to 65%. Wall motion was normal; there were no regional wall motion abnormalities. There was an increased relative contribution of atrial contraction to ventricular filling. Doppler parameters are consistent with abnormal left ventricular relaxation (grade 1 diastolic dysfunction). Doppler parameters are consistent with high ventricular filling pressure. - Mitral valve: Minimal focal calcification of the anterior leaflet  (medial segment(s)). There was trivial regurgitation. - Pulmonic valve: There was no regurgitation.  Lexiscan Myoview 01/10/2016  Nuclear stress EF: 69%.  This is a low risk study.  The left ventricular ejection fraction is hyperdynamic (>65%).  There was no ST segment deviation noted during stress.  The study is normal.  Small region of mid anteroseptal thinning that improves consistent with samll region of ischemia; cannot exclude shifting soft tissue Mild apical thinning that improves but does not appear significant for ischemia LVEF normal Ovreall low risk scan   EKG:  EKG is ordered today.  The ekg ordered today demonstrates NSR, 80 bpm, diffuse TWI- that have been intermittently present to varying degrees in the past.   Recent Labs: 08/23/2018: ALT 10; BUN 8; Creatinine, Ser 0.71; Potassium 3.9; Sodium 141   Recent Lipid Panel    Component Value Date/Time   CHOL 180 08/23/2018 1537   TRIG 95 08/23/2018 1537   HDL 49 08/23/2018 1537   CHOLHDL 3.7 08/23/2018 1537   CHOLHDL 2.5 01/10/2016 0842   VLDL 13 01/10/2016 0842   LDLCALC 112 (H) 08/23/2018 1537    Physical Exam:    VS:  BP (!) 162/92   Pulse 81   Temp 99.1 F (37.3 C)   Ht 4\' 9"  (1.448 m)   Wt 140 lb (63.5 kg)   SpO2 96%   BMI 30.30 kg/m     Wt Readings from Last 3 Encounters:  05/17/19 140 lb (63.5 kg)  08/23/18 143 lb 6.4 oz (65 kg)  02/10/18 146 lb 4 oz (66.3 kg)     Physical Exam  Constitutional: She is oriented to person, place, and time. She appears well-developed and well-nourished. No distress.  HENT:  Head: Normocephalic and atraumatic.  Neck: Normal range of motion. Neck supple. No JVD present.  Cardiovascular: Normal rate, regular rhythm, normal heart sounds and intact distal pulses. Exam reveals no gallop and no friction rub.  No murmur heard. Pulmonary/Chest: Effort normal and breath sounds normal. No respiratory distress.  She has no wheezes. She has no rales.  Abdominal:  Soft. Bowel sounds are normal.  Musculoskeletal: Normal range of motion.        General: No edema.  Neurological: She is alert and oriented to person, place, and time.  Skin: Skin is dry.  Psychiatric: She has a normal mood and affect. Her behavior is normal. Judgment and thought content normal.  Vitals reviewed.   ASSESSMENT:    1. Coronary artery disease involving native coronary artery of native heart without angina pectoris   2. Essential (primary) hypertension   3. Hyperlipidemia, unspecified hyperlipidemia type    PLAN:    In order of problems listed above:  CAD -Status post MI in 2008 with DES to the LAD and other moderate nonobstructive CAD.  Normal nuclear stress test in 2017. -She continues on aspirin 81 mg, Plavix 75 mg, Imdur 30 mg and statin.  Not on a beta-blocker due to history of bradycardia while on beta-blocker. -Pt doing very well with no angina.   Hypertension -On Imdur 30 mg, spironolactone 25 mg, amlodipine 5 mg. Lasix and enalapril were stopped at some point, unclear reason.  -BP elevated.  -Renal function has been normal. Will check current BMet.  -Will add enalapril 20 mg daily back (was previously 20 mg BID) and titrate as needed. Also can add HCTZ in the future if needed since off lasix, not having any edema.  -Dash diet encouraged, printout given.  -We will follow-up in 3 months for further blood pressure control.  Hyperlipidemia, goal LDL<70 -On rosuvastatin 40 mg daily and Zetia 10 mg -LDL was 112 and 08/2018 at which time the Zetia was added -Will recheck lipids  Medication Adjustments/Labs and Tests Ordered: Current medicines are reviewed at length with the patient today.  Concerns regarding medicines are outlined above. Labs and tests ordered and medication changes are outlined in the patient instructions below:  Patient Instructions  Medication Instructions:   Resume Enalapril 20mg  daily.   New prescription sent to pharmacy now.    Continue all other medications.    Labwork:  BMET, Lipids - orders given today.   Reminder:  Nothing to eat or drink after 12 midnight prior to labs.  Office will contact with results via phone or letter.    Testing/Procedures: none  Follow-Up: 3 months  Any Other Special Instructions Will Be Listed Below (If Applicable).  If you need a refill on your cardiac medications before your next appointment, please call your pharmacy.  -------------------------------------------------------------------  Lifestyle Modifications to Prevent and Treat Heart Disease -Recommend heart healthy/Mediterranean diet, with whole grains, fruits, vegetables, fish, lean meats, nuts, olive oil and avocado oil.  -Limit salt intake to less than 1500 mg per day.  -Recommend moderate walking, starting slowly with a few minutes and working up to 3-5 times/week for 30-50 minutes each session. Aim for at least 150 minutes.week. Goal should be pace of 3 miles/hours, or walking 1.5 miles in 30 minutes -Recommend avoidance of tobacco products. Avoid excess alcohol. -Keep blood pressure well controlled, ideally less than 130/80.        Signed, Daune Perch, NP  05/17/2019 2:03 PM    [place]

## 2019-06-08 ENCOUNTER — Other Ambulatory Visit: Payer: Self-pay | Admitting: Cardiovascular Disease

## 2019-06-08 ENCOUNTER — Other Ambulatory Visit: Payer: Self-pay | Admitting: Cardiology

## 2019-08-29 ENCOUNTER — Ambulatory Visit: Payer: Medicare Other | Admitting: Cardiovascular Disease

## 2019-08-30 ENCOUNTER — Encounter: Payer: Self-pay | Admitting: Cardiovascular Disease

## 2019-09-09 ENCOUNTER — Other Ambulatory Visit: Payer: Self-pay | Admitting: Internal Medicine

## 2019-09-09 DIAGNOSIS — E782 Mixed hyperlipidemia: Secondary | ICD-10-CM

## 2019-09-09 NOTE — Telephone Encounter (Signed)
This is a [place] pt °

## 2019-09-09 NOTE — Telephone Encounter (Signed)
Please review for refill. Thanks!  

## 2019-09-28 ENCOUNTER — Other Ambulatory Visit: Payer: Self-pay

## 2019-09-28 ENCOUNTER — Encounter: Payer: Self-pay | Admitting: Family Medicine

## 2019-09-28 ENCOUNTER — Ambulatory Visit (INDEPENDENT_AMBULATORY_CARE_PROVIDER_SITE_OTHER): Payer: Medicare Other | Admitting: Family Medicine

## 2019-09-28 VITALS — BP 180/74 | HR 67 | Ht <= 58 in | Wt 137.0 lb

## 2019-09-28 DIAGNOSIS — I2510 Atherosclerotic heart disease of native coronary artery without angina pectoris: Secondary | ICD-10-CM | POA: Diagnosis not present

## 2019-09-28 DIAGNOSIS — I10 Essential (primary) hypertension: Secondary | ICD-10-CM

## 2019-09-28 DIAGNOSIS — E782 Mixed hyperlipidemia: Secondary | ICD-10-CM

## 2019-09-28 MED ORDER — AMLODIPINE BESYLATE 10 MG PO TABS: 10.0000 mg | ORAL_TABLET | Freq: Every day | ORAL | 3 refills | Status: AC

## 2019-09-28 NOTE — Patient Instructions (Signed)
Medication Instructions:  INCREASE Amlodipine to 10 mg daily   *If you need a refill on your cardiac medications before your next appointment, please call your pharmacy*  Lab Work: none If you have labs (blood work) drawn today and your tests are completely normal, you will receive your results only by: Marland Kitchen MyChart Message (if you have MyChart) OR . A paper copy in the mail If you have any lab test that is abnormal or we need to change your treatment, we will call you to review the results.  Testing/Procedures: none  Follow-Up: At [facility], you and your health needs are our priority.  As part of our continuing mission to provide you with exceptional heart care, we have created designated Provider Care Teams.  These Care Teams include your primary Cardiologist (physician) and Advanced Practice Providers (APPs -  Physician Assistants and Nurse Practitioners) who all work together to provide you with the care you need, when you need it.  Your next appointment:   5 month(s)  The format for your next appointment:   In Person  Provider:   Kate Sable, MD or Levell July, NP  Other Instructions None       Thank you for choosing [facility] !

## 2019-09-28 NOTE — Progress Notes (Signed)
Cardiology Office Note  Date: 09/28/2019   ID: [name] MCCLEESE, DOB [birth year], MRN [id number]  PCP:  Arlyss Queen, MD  Cardiologist:  Prentice Docker, MD Electrophysiologist:  None   Chief Complaint  Patient presents with  . Follow-up    History of Present Illness: [name] [name] is a 85 y.o. female last seen May 17, 2019 by Berton Bon nurse practitioner for 1 year follow-up.Past medical history for CAD status post MI in 2008 with PCI/DES to LAD.  80% stenosis of first obtuse marginal, 70% distal circumflex stenosis, and 50% proximal RCA stenosis.  Previously decreased EF after MI but normalized back in 2009.  Repeat echo in 2011 showed preserved left ventricular function with mild diastolic dysfunction.  Stress testing July 15 showed no ischemia or infarction.  2017 she was worked up for complaints of dyspnea.  Echocardiogram showed normal EF at 65.  Grade 1 diastolic dysfunction, trivial MR.  Subsequent nuclear stress test low risk negative for ischemia.  Issues with beta-blocker therapy causing bradycardia resolving after discontinuation of beta-blocker.  History of syncope and 2019 and was ruled out for MI.  Syncope felt to be due to dehydration due to being on 3 separate diuretics.  At her visit with Mrs. Jeraldine Loots she was walking 3 times a week 10 minutes each time in her yard performing some housework.  At that time she denied any chest pain and only mild dyspnea with more than moderate exertion.  She stated at that time  her Lasix and enalapril were discontinued.  They have since been restarted.  Patient denies any recent progressive anginal or exertional symptoms, palpitations or arrhythmias, orthostatic symptoms, stroke or TIA-like symptoms, dyspeptic symptoms, or blood in stool or urine.  Denies any claudication-like symptoms, DVT or PE-like symptoms, or lower extremity edema.   She is walking with a cane today without any issues.  Blood pressure is elevated on  arrival.  Medications and lab work reviewed with patient.   Past Medical History:  Diagnosis Date  . Ankle edema   . Arthritis    right hip  . Bronchitis    in Dec  . CAD (coronary artery disease)    acute MI in 3/08 - taxus stent  . Carotid stenosis    mild  . Cataract    left;not ready to come off  . Constipation    takes OTC stool softener as needed  . Diabetes mellitus    pt doesn't take any meds for it  . DJD (degenerative joint disease) of hip    right  . Dry cough   . GERD (gastroesophageal reflux disease)   . Headache(784.0)    occasionally  . HTN (hypertension)    takes Amlodpine and Carvedilol daily  . Hx of cardiovascular stress test    Lexiscan Myoview (04/2014):  No ischemia, EF 64%, Low Risk  . Hypercholesteremia    takes Crestor daily  . Hypothyroidism    takes Synthroid daily  . Ileus, postoperative (HCC) 05/12/2014  . Myocardial infarction [facility])    pt unsure but thinks about 24yrs ago;is taking Plavix daily   . Nocturia   . Osteoarthritis of left hip 05/09/2014  . Primary osteoarthritis of right hip 10/27/2011    Past Surgical History:  Procedure Laterality Date  . ABDOMINAL HYSTERECTOMY  20+yrs  . BACK SURGERY  15+yrs ago  . CARDIAC CATHETERIZATION  2008  . CORONARY ARTERY BYPASS GRAFT    . LYMPH NODE DISSECTION  20+yrs ago  right   . TOTAL HIP ARTHROPLASTY  10/27/2011   Procedure: TOTAL HIP ARTHROPLASTY;  Surgeon: Eulas PostJoshua P Landau, MD;  Location: [facility] OR;  Service: Orthopedics;  Laterality: Right;  . TOTAL HIP ARTHROPLASTY Left 05/09/2014   Procedure: LEFT TOTAL HIP ARTHROPLASTY;  Surgeon: Eulas PostJoshua P Landau, MD;  Location: [facility] OR;  Service: Orthopedics;  Laterality: Left;    Current Outpatient Medications  Medication Sig Dispense Refill  . amLODipine (NORVASC) 10 MG tablet Take 1 tablet (10 mg total) by mouth daily. 90 tablet 3  . aspirin 81 MG tablet Take 81 mg by mouth daily.    . clopidogrel (PLAVIX) 75 MG tablet TAKE 1 TABLET BY MOUTH ONCE DAILY.  30 tablet 3  . enalapril (VASOTEC) 20 MG tablet Take 1 tablet (20 mg total) by mouth daily. 30 tablet 6  . ezetimibe (ZETIA) 10 MG tablet TAKE 1 TABLET BY MOUTH ONCE DAILY. 30 tablet 2  . isosorbide mononitrate (IMDUR) 30 MG 24 hr tablet TAKE 1 TABLET BY MOUTH ONCE DAILY. 30 tablet 6  . levothyroxine (SYNTHROID) 125 MCG tablet Take 125 mcg by mouth daily.    . nitroGLYCERIN (NITROSTAT) 0.4 MG SL tablet Place 1 tablet (0.4 mg total) under the tongue every 5 (five) minutes as needed for chest pain. 25 tablet 3  . rosuvastatin (CRESTOR) 40 MG tablet Take 1 tablet (40 mg total) by mouth daily. 90 tablet 3  . spironolactone (ALDACTONE) 25 MG tablet TAKE 1 TABLET BY MOUTH ONCE DAILY. 30 tablet 3   No current facility-administered medications for this visit.   Allergies:  Patient has no known allergies.   Social History: The patient  reports that she has never smoked. She has never used smokeless tobacco. She reports that she does not drink alcohol or use drugs.   Family History: The patient's family history includes Diabetes in her brother; Heart attack in her brother and sister; Hyperlipidemia in her sister; Hypertension in her brother, daughter, father, mother, sister, and son; Stroke in her mother.   ROS:  Please see the history of present illness. Otherwise, complete review of systems is positive for none.  All other systems are reviewed and negative.   Physical Exam: VS:  BP (!) 180/74   Pulse 67   Ht 4\' 9"  (1.448 m)   Wt 137 lb (62.1 kg)   BMI 29.65 kg/m , BMI Body mass index is 29.65 kg/m.  Wt Readings from Last 3 Encounters:  09/28/19 137 lb (62.1 kg)  05/17/19 140 lb (63.5 kg)  08/23/18 143 lb 6.4 oz (65 kg)    General: Patient appears comfortable at rest. HEENT: Conjunctiva and lids normal, oropharynx clear with moist mucosa. Neck: Supple, no elevated JVP or carotid bruits, no thyromegaly. Lungs: Clear to auscultation, nonlabored breathing at rest. Cardiac: Regular rate and  rhythm, no S3 or significant systolic murmur, no pericardial rub. Abdomen: Soft, nontender, no hepatomegaly, bowel sounds present, no guarding or rebound. Extremities: No pitting edema, distal pulses 2+. Skin: Warm and dry. Musculoskeletal: No kyphosis. Neuropsychiatric: Alert and oriented x3, affect grossly appropriate.  ECG:  An ECG dated May 18, 2019 was personally reviewed today and demonstrated:  Normal sinus rhythm rate of 80.  Septal infarct, age undetermined, marked ST abnormality, possible inferior subendocardial injury.  Similar to prior EKGs.  No chest pain or shortness of breath at the time.  Recent Labwork: No results found for requested labs within last 8760 hours.   Recent lab work from [facility] on April 19, 2019 glucose 142, BUN 17, AST 16, potassium 4, sodium 140, creatinine 0.63, ALT 10, GFR 96, cholesterol 105, triglycerides 91, HDL 45, VLDL 18, LDL 42, TSH 7.03,    Component Value Date/Time   CHOL 180 08/23/2018 1537   TRIG 95 08/23/2018 1537   HDL 49 08/23/2018 1537   CHOLHDL 3.7 08/23/2018 1537   CHOLHDL 2.5 01/10/2016 0842   VLDL 13 01/10/2016 0842   LDLCALC 112 (H) 08/23/2018 1537    Other Studies Reviewed Today:  Echocardiogram 01/10/2016 Study Conclusions - Left ventricle: The cavity size was normal. There was mild focal basal hypertrophy of the septum. Systolic function was normal. The estimated ejection fraction was in the range of 60% to 65%. Wall motion was normal; there were no regional wall motion abnormalities. There was an increased relative contribution of atrial contraction to ventricular filling. Doppler parameters are consistent with abnormal left ventricular relaxation (grade 1 diastolic dysfunction). Doppler parameters are consistent with high ventricular filling pressure. - Mitral valve: Minimal focal calcification of the anterior leaflet (medial segment(s)). There was trivial regurgitation. - Pulmonic  valve: There was no regurgitation.  Lexiscan Myoview 01/10/2016  Nuclear stress EF: 69%.  This is a low risk study.  The left ventricular ejection fraction is hyperdynamic (>65%).  There was no ST segment deviation noted during stress.  The study is normal.  Small region of mid anteroseptal thinning that improves consistent with samll region of ischemia; cannot exclude shifting soft tissue Mild apical thinning that improves but does not appear significant for ischemia LVEF normal Ovreall low risk scan  Assessment and Plan:  1. Coronary artery disease involving native coronary artery of native heart without angina pectoris   2. Essential (primary) hypertension   3. Mixed hyperlipidemia     1. Coronary artery disease involving native coronary artery of native heart without angina pectoris Denies any progressive anginal or exertional symptoms.  She is able to perform all activities of daily living walks in her yard 3 times a week without any symptoms.  Continue aspirin 81 mg, Plavix 75 mg, enalapril 20 mg, Imdur 30 mg daily, and sublingual nitroglycerin as needed for chest pain.  2. Essential (primary) hypertension Blood pressure elevated today at 180/74.  Patient has her grandson occasionally check her blood pressure.  Last pressure was also elevated.  Increase amlodipine to 10 mg daily.  Continue spironolactone 25 mg daily.  3. Mixed hyperlipidemia recent lipid panel from July 2020 showed cholesterol 105, triglycerides 91, HDL 45, VLDL 18, LDL 42, TSH 7.03.  Continue Crestor 40 mg daily and Zetia 10 mg daily  Medication Adjustments/Labs and Tests Ordered: Current medicines are reviewed at length with the patient today.  Concerns regarding medicines are outlined above.  Increase amlodipine to 10 mg daily.   There are no Patient Instructions on file for this visit.       Signed, Levell July, NP 09/28/2019 11:08 AM    [facility] [place], [place], [place] [address] Phone: [phone]; Fax: [phone]

## 2019-09-29 ENCOUNTER — Ambulatory Visit: Payer: Medicare Other | Admitting: Cardiovascular Disease

## 2019-10-08 ENCOUNTER — Other Ambulatory Visit: Payer: Self-pay | Admitting: Cardiovascular Disease

## 2019-11-30 ENCOUNTER — Other Ambulatory Visit: Payer: Self-pay | Admitting: Internal Medicine

## 2019-11-30 ENCOUNTER — Other Ambulatory Visit: Payer: Self-pay | Admitting: Cardiovascular Disease

## 2019-11-30 DIAGNOSIS — E782 Mixed hyperlipidemia: Secondary | ICD-10-CM

## 2019-11-30 NOTE — Telephone Encounter (Signed)
Refill Request.  

## 2020-01-31 ENCOUNTER — Ambulatory Visit: Payer: Medicare Other | Admitting: Cardiovascular Disease

## 2020-03-06 ENCOUNTER — Other Ambulatory Visit: Payer: Self-pay | Admitting: Cardiovascular Disease

## 2020-03-08 ENCOUNTER — Other Ambulatory Visit: Payer: Self-pay | Admitting: Cardiovascular Disease

## 2020-03-30 ENCOUNTER — Other Ambulatory Visit: Payer: Self-pay | Admitting: Cardiovascular Disease

## 2020-04-13 ENCOUNTER — Encounter: Payer: Self-pay | Admitting: *Deleted

## 2020-04-28 ENCOUNTER — Other Ambulatory Visit: Payer: Self-pay

## 2020-04-28 ENCOUNTER — Encounter (HOSPITAL_COMMUNITY): Payer: Self-pay | Admitting: *Deleted

## 2020-04-28 ENCOUNTER — Emergency Department (HOSPITAL_COMMUNITY)
Admission: EM | Admit: 2020-04-28 | Discharge: 2020-04-28 | Disposition: A | Discharge status: Home / Self Care | Payer: Medicare Other | Attending: Emergency Medicine | Admitting: Emergency Medicine

## 2020-04-28 DIAGNOSIS — I10 Essential (primary) hypertension: Secondary | ICD-10-CM | POA: Insufficient documentation

## 2020-04-28 DIAGNOSIS — Z79899 Other long term (current) drug therapy: Secondary | ICD-10-CM | POA: Diagnosis not present

## 2020-04-28 DIAGNOSIS — J069 Acute upper respiratory infection, unspecified: Secondary | ICD-10-CM | POA: Insufficient documentation

## 2020-04-28 DIAGNOSIS — Z7982 Long term (current) use of aspirin: Secondary | ICD-10-CM | POA: Insufficient documentation

## 2020-04-28 DIAGNOSIS — E039 Hypothyroidism, unspecified: Secondary | ICD-10-CM | POA: Insufficient documentation

## 2020-04-28 DIAGNOSIS — E119 Type 2 diabetes mellitus without complications: Secondary | ICD-10-CM | POA: Diagnosis not present

## 2020-04-28 DIAGNOSIS — I2510 Atherosclerotic heart disease of native coronary artery without angina pectoris: Secondary | ICD-10-CM | POA: Insufficient documentation

## 2020-04-28 DIAGNOSIS — Z20822 Contact with and (suspected) exposure to covid-19: Secondary | ICD-10-CM | POA: Insufficient documentation

## 2020-04-28 LAB — SARS CORONAVIRUS 2 BY RT PCR (HOSPITAL ORDER, PERFORMED IN ~~LOC~~ HOSPITAL LAB): SARS Coronavirus 2: NEGATIVE

## 2020-04-28 MED ORDER — BENZONATATE 100 MG PO CAPS
100.0000 mg | ORAL_CAPSULE | Freq: Three times a day (TID) | ORAL | 0 refills | Status: DC
Start: 2020-04-28 — End: 2021-06-16

## 2020-04-28 MED ORDER — FLUTICASONE PROPIONATE 50 MCG/ACT NA SUSP: 1.0000 | Freq: Every day | NASAL | 2 refills | Status: AC

## 2020-04-28 NOTE — ED Triage Notes (Signed)
Pt c/o runny nose and headache x 2 days

## 2020-04-28 NOTE — ED Provider Notes (Signed)
[facility] EMERGENCY DEPARTMENT Provider Note   CSN: [id number] Arrival date & time: 04/28/20  1131     History Chief Complaint  Patient presents with  . URI    [name] [name] is a 86 y.o. female who presents for evaluation of 2 days of rhinorrhea, congestion, sinus pressure.  She states that she is also had a slight cough that is productive.  She has not any trouble breathing or chest pain.  She states that she has not used any medications at home.  She states that she feels like her nose is so stopped up and is causing everything to hurt in her face.  She has not noted any fevers at home.  She has gotten Covid vaccinated.  No known Covid exposure that she knows of.  The history is provided by the patient.       Past Medical History:  Diagnosis Date  . Ankle edema   . Arthritis    right hip  . Bronchitis    in Dec  . CAD (coronary artery disease)    acute MI in 3/08 - taxus stent  . Carotid stenosis    mild  . Cataract    left;not ready to come off  . Constipation    takes OTC stool softener as needed  . Diabetes mellitus    pt doesn't take any meds for it  . DJD (degenerative joint disease) of hip    right  . Dry cough   . GERD (gastroesophageal reflux disease)   . Headache(784.0)    occasionally  . HTN (hypertension)    takes Amlodpine and Carvedilol daily  . Hx of cardiovascular stress test    Lexiscan Myoview (04/2014):  No ischemia, EF 64%, Low Risk  . Hypercholesteremia    takes Crestor daily  . Hypothyroidism    takes Synthroid daily  . Ileus, postoperative (HCC) 05/12/2014  . Myocardial infarction [facility](HCC)    pt unsure but thinks about 5675yrs ago;is taking Plavix daily   . Nocturia   . Osteoarthritis of left hip 05/09/2014  . Primary osteoarthritis of right hip 10/27/2011    Patient Active Problem List   Diagnosis Date Noted  . Ischemic cardiomyopathy 08/24/2018  . Ectopic atrial rhythm 08/24/2018  . Ileus, postoperative (HCC) 05/12/2014  .  Osteoarthritis of left hip 05/09/2014  . Hip arthritis 05/09/2014  . Primary osteoarthritis of right hip 10/27/2011  . Hip pain 08/24/2011  . CHRONIC DIASTOLIC HEART FAILURE 12/24/2009  . SHORTNESS OF BREATH 11/29/2009  . Hyperlipidemia 09/17/2008  . Essential hypertension 09/17/2008  . Coronary artery disease of native artery of native heart with stable angina pectoris (HCC) 09/17/2008  . CAROTID ARTERY STENOSIS 09/17/2008    Past Surgical History:  Procedure Laterality Date  . ABDOMINAL HYSTERECTOMY  20+yrs  . BACK SURGERY  15+yrs ago  . CARDIAC CATHETERIZATION  2008  . CORONARY ARTERY BYPASS GRAFT    . LYMPH NODE DISSECTION  20+yrs ago   right   . TOTAL HIP ARTHROPLASTY  10/27/2011   Procedure: TOTAL HIP ARTHROPLASTY;  Surgeon: Eulas PostJoshua P Landau, MD;  Location: [facility] OR;  Service: Orthopedics;  Laterality: Right;  . TOTAL HIP ARTHROPLASTY Left 05/09/2014   Procedure: LEFT TOTAL HIP ARTHROPLASTY;  Surgeon: Eulas PostJoshua P Landau, MD;  Location: [facility] OR;  Service: Orthopedics;  Laterality: Left;     OB History   No obstetric history on file.     Family History  Problem Relation Age of Onset  . Hypertension Mother   .  Stroke Mother   . Hypertension Father   . Heart attack Brother   . Heart attack Sister   . Hypertension Sister   . Hypertension Brother   . Hypertension Daughter   . Hypertension Son   . Hyperlipidemia Sister   . Diabetes Brother   . Anesthesia problems Neg Hx   . Hypotension Neg Hx   . Malignant hyperthermia Neg Hx   . Pseudochol deficiency Neg Hx     Social History   Tobacco Use  . Smoking status: Never Smoker  . Smokeless tobacco: Never Used  Vaping Use  . Vaping Use: Never used  Substance Use Topics  . Alcohol use: No  . Drug use: No    Home Medications Prior to Admission medications   Medication Sig Start Date End Date Taking? Authorizing Provider  amLODipine (NORVASC) 10 MG tablet Take 1 tablet (10 mg total) by mouth daily. 09/28/19   Netta Neat., NP  aspirin 81 MG tablet Take 81 mg by mouth daily.    [provider]  benzonatate (TESSALON) 100 MG capsule Take 1 capsule (100 mg total) by mouth every 8 (eight) hours. 04/28/20   Maxwell Caul, PA-C  clopidogrel (PLAVIX) 75 MG tablet TAKE 1 TABLET BY MOUTH ONCE DAILY. 10/10/19   Laqueta Linden, MD  enalapril (VASOTEC) 20 MG tablet TAKE 1 TABLET BY MOUTH ONCE DAILY. 03/07/20   Laqueta Linden, MD  ezetimibe (ZETIA) 10 MG tablet TAKE 1 TABLET BY MOUTH ONCE DAILY. 11/30/19   Laqueta Linden, MD  fluticasone (FLONASE) 50 MCG/ACT nasal spray Place 1 spray into both nostrils daily. 04/28/20   Maxwell Caul, PA-C  isosorbide mononitrate (IMDUR) 30 MG 24 hr tablet TAKE 1 TABLET BY MOUTH ONCE DAILY. 03/08/20   Laqueta Linden, MD  levothyroxine (SYNTHROID) 125 MCG tablet Take 125 mcg by mouth daily.    [provider]  nitroGLYCERIN (NITROSTAT) 0.4 MG SL tablet Place 1 tablet (0.4 mg total) under the tongue every 5 (five) minutes as needed for chest pain. 05/17/19 09/28/19  Laqueta Linden, MD  rosuvastatin (CRESTOR) 40 MG tablet TAKE 1 TABLET BY MOUTH ONCE DAILY. 11/30/19   Laqueta Linden, MD  spironolactone (ALDACTONE) 25 MG tablet TAKE 1 TABLET BY MOUTH ONCE DAILY. 10/10/19   Laqueta Linden, MD    Allergies    Patient has no known allergies.  Review of Systems   Review of Systems  Constitutional: Negative for fever.  HENT: Positive for congestion, rhinorrhea and sinus pressure.   Respiratory: Positive for cough. Negative for shortness of breath.   Cardiovascular: Negative for chest pain.  Neurological: Negative for headaches.  All other systems reviewed and are negative.   Physical Exam Updated Vital Signs BP (!) 139/60 (BP Location: Right Arm)   Pulse 69   Temp 98.1 F (36.7 C) (Oral)   Resp 17   Ht 4\' 9"  (1.448 m)   Wt 65.8 kg   SpO2 100%   BMI 31.38 kg/m   Physical Exam Vitals and nursing note reviewed.    Constitutional:      Appearance: She is well-developed.  HENT:     Head: Normocephalic and atraumatic.     Nose: Congestion present.     Right Turbinates: Enlarged and swollen.     Left Turbinates: Enlarged and swollen.     Right Sinus: Frontal sinus tenderness present.     Left Sinus: Frontal sinus tenderness present.     Comments:  Turbinates are edematous, erythematous bilaterally.    Mouth/Throat:     Dentition: Abnormal dentition.     Comments: History oropharynx is clear without any signs of erythema, edema, exudates.  Uvula is midline.  Airway is patent. Eyes:     General: No scleral icterus.       Right eye: No discharge.        Left eye: No discharge.     Conjunctiva/sclera: Conjunctivae normal.  Pulmonary:     Effort: Pulmonary effort is normal.     Comments: Lungs clear to auscultation bilaterally.  Symmetric chest rise.  No wheezing, rales, rhonchi. Skin:    General: Skin is warm and dry.  Neurological:     Mental Status: She is alert.  Psychiatric:        Speech: Speech normal.        Behavior: Behavior normal.     ED Results / Procedures / Treatments   Labs (all labs ordered are listed, but only abnormal results are displayed) Labs Reviewed  SARS CORONAVIRUS 2 BY RT PCR (HOSPITAL ORDER, PERFORMED IN [facility])    EKG None  Radiology No results found.  Procedures Procedures (including critical care time)  Medications Ordered in ED Medications - No data to display  ED Course  I have reviewed the triage vital signs and the nursing notes.  Pertinent labs & imaging results that were available during my care of the patient were reviewed by me and considered in my medical decision making (see chart for details).    MDM Rules/Calculators/A&P                          86 year old female who presents for evaluation of sinus pressure, nasal congestion that has been ongoing for last 2 days.  No fevers that she is measured.  She states she  feels congestion is causing pressure in her face.  She states she has had a slight dry cough.  She did get Covid vaccinated.  She has not had any known COVID-19 exposure.  On initially arrival, she is afebrile, nontoxic-appearing.  Vital signs are stable.  Suspect this may be sinusitis related to viral process versus viral URI.  Also consider COVID-19.  History/physical exam not concerning for pneumonia.  Her symptoms have been only going on for 2 days in duration.  She is afebrile.  At this time, no indication for antibiotics.  We will plan for supportive care with Flonase, Tessalon Perles for symptomatic relief. Discussed patient with Dr. Estell Harpin who is agreeable to plan. Patient had ample opportunity for questions and discussion. All patient's questions were answered with full understanding. Strict return precautions discussed. Patient expresses understanding and agreement to plan.   [name] [name] was evaluated in Emergency Department on 04/28/2020 for the symptoms described in the history of present illness. She was evaluated in the context of the global COVID-19 pandemic, which necessitated consideration that the patient might be at risk for infection with the SARS-CoV-2 virus that causes COVID-19. Institutional protocols and algorithms that pertain to the evaluation of patients at risk for COVID-19 are in a state of rapid change based on information released by regulatory bodies including the CDC and federal and state organizations. These policies and algorithms were followed during the patient's care in the ED.   Portions of this note were generated with Scientist, clinical (histocompatibility and immunogenetics). Dictation errors may occur despite best attempts at proofreading.  Final Clinical Impression(s) / ED  Diagnoses Final diagnoses:  Viral upper respiratory tract infection    Rx / DC Orders ED Discharge Orders         Ordered    fluticasone (FLONASE) 50 MCG/ACT nasal spray  Daily     Discontinue  Reprint     04/28/20 1421     benzonatate (TESSALON) 100 MG capsule  Every 8 hours     Discontinue  Reprint     04/28/20 1421           Maxwell Caul, PA-C 04/28/20 1502    Bethann Berkshire, MD 04/29/20 [phone]

## 2020-04-28 NOTE — Discharge Instructions (Addendum)
Take Flonase as directed for congestion.  He can use Tessalon Perles for cough.  As we discussed, you have a Covid test pending.  This will take a few hours to come back.  You can check online or on MyChart app regarding results.  Return to the emergency department for any persistent cough, difficulty breathing, high fevers, any other worsening or concerning symptoms.

## 2020-05-23 ENCOUNTER — Encounter: Payer: Self-pay | Admitting: *Deleted

## 2020-05-23 ENCOUNTER — Ambulatory Visit (INDEPENDENT_AMBULATORY_CARE_PROVIDER_SITE_OTHER): Payer: Medicare Other | Admitting: Student

## 2020-05-23 ENCOUNTER — Other Ambulatory Visit: Payer: Self-pay

## 2020-05-23 ENCOUNTER — Encounter: Payer: Self-pay | Admitting: Student

## 2020-05-23 VITALS — BP 130/60 | HR 72 | Ht 61.0 in | Wt 142.6 lb

## 2020-05-23 DIAGNOSIS — E785 Hyperlipidemia, unspecified: Secondary | ICD-10-CM

## 2020-05-23 DIAGNOSIS — I2510 Atherosclerotic heart disease of native coronary artery without angina pectoris: Secondary | ICD-10-CM | POA: Diagnosis not present

## 2020-05-23 DIAGNOSIS — I10 Essential (primary) hypertension: Secondary | ICD-10-CM | POA: Diagnosis not present

## 2020-05-23 NOTE — Progress Notes (Signed)
Cardiology Office Note    Date:  05/23/2020   ID:  [name] [name], DOB 1934-02-06, MRN [id number]  PCP:  [facility]  Cardiologist: Previously Dr. Purvis Sheffield --> Will follow-up with Dr. Wyline Mood  Chief Complaint  Patient presents with  . Follow-up    History of Present Illness:    [name] [name] is a 86 y.o. female with past medical history of CAD (s/p DES to LAD in 2008, NST in 2015 and 2017 which were low-risk), history of ICM and normalized by repeat imaging, HTN and HLD who presents to the office today for 19-month follow-up.  She was last examined by Nena Polio, NP on 09/28/2019 and denied any recent progressive dyspnea on exertion or chest pain. BP was elevated at 180/74 during her visit, therefore Amlodipine was increased to 10 mg daily. She was continued on ASA 81 mg daily, Plavix 75 mg daily, Enalapril 20 mg daily, Imdur 30 mg daily, Zetia 10 mg daily, Crestor 40 mg daily and Spironolactone 25 mg daily. She had previously been intolerant to beta-blocker therapy in the past and developed significant bradycardia with this.  In talking with the patient and her daughter today, she reports overall doing well from a cardiac perspective since her last visit. She denies any recent chest pain or dyspnea on exertion. No recent orthopnea, PND or lower extremity edema. She does stay active around the house and reports doing chores including cleaning and cooking. She does live with her daughter and uses a cane as needed for ambulation. Denies any recent falls.   Past Medical History:  Diagnosis Date  . Ankle edema   . Arthritis    right hip  . Bronchitis    in Dec  . CAD (coronary artery disease)    a. s/p DES to LAD in 2008, NST in 2015 and 2017 which were low-risk  . Carotid stenosis    mild  . Cataract    left;not ready to come off  . Constipation    takes OTC stool softener as needed  . Diabetes mellitus    pt doesn't take any meds for it  . DJD  (degenerative joint disease) of hip    right  . Dry cough   . GERD (gastroesophageal reflux disease)   . Headache(784.0)    occasionally  . HTN (hypertension)    takes Amlodpine and Carvedilol daily  . Hx of cardiovascular stress test    Lexiscan Myoview (04/2014):  No ischemia, EF 64%, Low Risk  . Hypercholesteremia    takes Crestor daily  . Hypothyroidism    takes Synthroid daily  . Ileus, postoperative (HCC) 05/12/2014  . Myocardial infarction [facility])    pt unsure but thinks about 73yrs ago;is taking Plavix daily   . Nocturia   . Osteoarthritis of left hip 05/09/2014  . Primary osteoarthritis of right hip 10/27/2011    Past Surgical History:  Procedure Laterality Date  . ABDOMINAL HYSTERECTOMY  20+yrs  . BACK SURGERY  15+yrs ago  . CARDIAC CATHETERIZATION  2008  . CORONARY ARTERY BYPASS GRAFT    . LYMPH NODE DISSECTION  20+yrs ago   right   . TOTAL HIP ARTHROPLASTY  10/27/2011   Procedure: TOTAL HIP ARTHROPLASTY;  Surgeon: Eulas Post, MD;  Location: [facility] OR;  Service: Orthopedics;  Laterality: Right;  . TOTAL HIP ARTHROPLASTY Left 05/09/2014   Procedure: LEFT TOTAL HIP ARTHROPLASTY;  Surgeon: Eulas Post, MD;  Location: [facility] OR;  Service: Orthopedics;  Laterality: Left;    Current Medications: Outpatient Medications Prior to Visit  Medication Sig Dispense Refill  . amLODipine (NORVASC) 10 MG tablet Take 1 tablet (10 mg total) by mouth daily. 90 tablet 3  . aspirin 81 MG tablet Take 81 mg by mouth daily.    . benzonatate (TESSALON) 100 MG capsule Take 1 capsule (100 mg total) by mouth every 8 (eight) hours. 21 capsule 0  . clopidogrel (PLAVIX) 75 MG tablet TAKE 1 TABLET BY MOUTH ONCE DAILY. 90 tablet 1  . enalapril (VASOTEC) 20 MG tablet TAKE 1 TABLET BY MOUTH ONCE DAILY. 30 tablet 0  . ezetimibe (ZETIA) 10 MG tablet TAKE 1 TABLET BY MOUTH ONCE DAILY. 30 tablet 11  . fluticasone (FLONASE) 50 MCG/ACT nasal spray Place 1 spray into both nostrils daily. 16 g 2  . isosorbide  mononitrate (IMDUR) 30 MG 24 hr tablet TAKE 1 TABLET BY MOUTH ONCE DAILY. 30 tablet 11  . levothyroxine (SYNTHROID) 125 MCG tablet Take 125 mcg by mouth daily.    . nitroGLYCERIN (NITROSTAT) 0.4 MG SL tablet Place 1 tablet (0.4 mg total) under the tongue every 5 (five) minutes as needed for chest pain. 25 tablet 3  . rosuvastatin (CRESTOR) 40 MG tablet TAKE 1 TABLET BY MOUTH ONCE DAILY. 30 tablet 11  . spironolactone (ALDACTONE) 25 MG tablet TAKE 1 TABLET BY MOUTH ONCE DAILY. 90 tablet 1   No facility-administered medications prior to visit.     Allergies:   Patient has no known allergies.   Social History   Socioeconomic History  . Marital status: Widowed    Spouse name: Not on file  . Number of children: Not on file  . Years of education: Not on file  . Highest education level: Not on file  Occupational History  . Not on file  Tobacco Use  . Smoking status: Never Smoker  . Smokeless tobacco: Never Used  Vaping Use  . Vaping Use: Never used  Substance and Sexual Activity  . Alcohol use: No  . Drug use: No  . Sexual activity: Never    Birth control/protection: Surgical  Other Topics Concern  . Not on file  Social History Narrative  . Not on file   Social Determinants of Health   Financial Resource Strain:   . Difficulty of Paying Living Expenses:   Food Insecurity:   . Worried About Programme researcher, broadcasting/film/video in the Last Year:   . Barista in the Last Year:   Transportation Needs:   . Freight forwarder (Medical):   Marland Kitchen Lack of Transportation (Non-Medical):   Physical Activity:   . Days of Exercise per Week:   . Minutes of Exercise per Session:   Stress:   . Feeling of Stress :   Social Connections:   . Frequency of Communication with Friends and Family:   . Frequency of Social Gatherings with Friends and Family:   . Attends Religious Services:   . Active Member of Clubs or Organizations:   . Attends Banker Meetings:   Marland Kitchen Marital Status:       Family History:  The patient's family history includes Diabetes in her brother; Heart attack in her brother and sister; Hyperlipidemia in her sister; Hypertension in her brother, daughter, father, mother, sister, and son; Stroke in her mother.   Review of Systems:   Please see the history of present illness.     General:  No chills, fever, night sweats or weight changes.  Cardiovascular:  No chest pain, dyspnea on exertion, edema, orthopnea, palpitations, paroxysmal nocturnal dyspnea. Dermatological: No rash, lesions/masses Respiratory: No cough, dyspnea Urologic: No hematuria, dysuria Abdominal:   No nausea, vomiting, diarrhea, bright red blood per rectum, melena, or hematemesis Neurologic:  No visual changes, wkns, changes in mental status. All other systems reviewed and are otherwise negative except as noted above.   Physical Exam:    VS:  BP 130/60   Pulse 72   Ht 5\' 1"  (1.549 m)   Wt 142 lb 9.6 oz (64.7 kg)   SpO2 98%   BMI 26.94 kg/m    General: Well developed, well nourished,female appearing in no acute distress. Head: Normocephalic, atraumatic. Neck: No carotid bruits. JVD not elevated.  Lungs: Respirations regular and unlabored, without wheezes or rales.  Heart: Regular rate and rhythm. No S3 or S4.  No murmur, no rubs, or gallops appreciated. Abdomen: Appears non-distended. No obvious abdominal masses. Msk:  Strength and tone appear normal for age. No obvious joint deformities or effusions. Extremities: No clubbing or cyanosis. No lower extremity edema.  Distal pedal pulses are 2+ bilaterally. Neuro: Alert and oriented X 3. Moves all extremities spontaneously. No focal deficits noted. Psych:  Responds to questions appropriately with a normal affect. Skin: No rashes or lesions noted  Wt Readings from Last 3 Encounters:  05/23/20 142 lb 9.6 oz (64.7 kg)  04/28/20 145 lb (65.8 kg)  09/28/19 137 lb (62.1 kg)     Studies/Labs Reviewed:   EKG:  EKG is ordered  today.  The ekg ordered today demonstrates normal sinus rhythm, heart rate 72 with ectopic atrial beats.  LVH with associated repolarization abnormalities along the inferior and lateral leads which is similar to prior tracings.  Recent Labs: No results found for requested labs within last 8760 hours.   Lipid Panel    Component Value Date/Time   CHOL 180 08/23/2018 1537   TRIG 95 08/23/2018 1537   HDL 49 08/23/2018 1537   CHOLHDL 3.7 08/23/2018 1537   CHOLHDL 2.5 01/10/2016 0842   VLDL 13 01/10/2016 0842   LDLCALC 112 (H) 08/23/2018 1537    Additional studies/ records that were reviewed today include:   Echocardiogram: 01/2016 Study Conclusions   - Left ventricle: The cavity size was normal. There was mild focal  basal hypertrophy of the septum. Systolic function was normal.  The estimated ejection fraction was in the range of 60% to 65%.  Wall motion was normal; there were no regional wall motion  abnormalities. There was an increased relative contribution of  atrial contraction to ventricular filling. Doppler parameters are  consistent with abnormal left ventricular relaxation (grade 1  diastolic dysfunction). Doppler parameters are consistent with  high ventricular filling pressure.  - Mitral valve: Minimal focal calcification of the anterior leaflet  (medial segment(s)). There was trivial regurgitation.  - Pulmonic valve: There was no regurgitation.   NST: 01/2016  Nuclear stress EF: 69%.  There was no ST segment deviation noted during stress.  Defect 1: There is a small defect present in the mid anteroseptal location.  Defect 2: There is a small defect of mild severity present in the apex location.  Findings consistent with ischemia.  This is a low risk study.  The left ventricular ejection fraction is normal (55-65%).   Small region of mid anteroseptal thinning that improves consistent with samll region of ischemia; cannot exclude shifting  soft tissue  Mild apical thinning that improves but does not appear significant for ischemia  LVEF normal  Ovreall low risk scan      Assessment:    1. Coronary artery disease involving native coronary artery of native heart without angina pectoris   2. Essential (primary) hypertension   3. Hyperlipidemia LDL goal <70      Plan:   In order of problems listed above:  1. CAD - She is s/p DES to LAD in 2008 with NST in 2015 and 2017 being low-risk studies.  - She denies any recent chest pain or dyspnea on exertion.  - Continue ASA 81mg  daily, Plavix 75mg  daily, and Imdur 30mg  daily. She is not on a BB given prior bradycardia with this.   2. HTN - BP is well-controlled at 130/60 during today's visit. Continue current medication regimen with Amlodipine 10 mg daily, Enalapril 20 mg daily, Imdur 30 mg daily and Spironolactone 25mg  daily.   3. HLD - LDL was at 42 in 04/2019. Will request a copy of most recent labs. Continue current medication regimen with Zetia 10 mg daily and Crestor 40 mg daily.    Medication Adjustments/Labs and Tests Ordered: Current medicines are reviewed at length with the patient today.  Concerns regarding medicines are outlined above.  Medication changes, Labs and Tests ordered today are listed in the Patient Instructions below. Patient Instructions  Medication Instructions:  Your physician recommends that you continue on your current medications as directed. Please refer to the Current Medication list given to you today.  *If you need a refill on your cardiac medications before your next appointment, please call your pharmacy*   Lab Work: NONE   If you have labs (blood work) drawn today and your tests are completely normal, you will receive your results only by: Marland Kitchen. MyChart Message (if you have MyChart) OR . A paper copy in the mail If you have any lab test that is abnormal or we need to change your treatment, we will call you to review the  results.   Testing/Procedures: NONE    Follow-Up: At [facility], you and your health needs are our priority.  As part of our continuing mission to provide you with exceptional heart care, we have created designated Provider Care Teams.  These Care Teams include your primary Cardiologist (physician) and Advanced Practice Providers (APPs -  Physician Assistants and Nurse Practitioners) who all work together to provide you with the care you need, when you need it.  We recommend signing up for the patient portal called "MyChart".  Sign up information is provided on this After Visit Summary.  MyChart is used to connect with patients for Virtual Visits (Telemedicine).  Patients are able to view lab/test results, encounter notes, upcoming appointments, etc.  Non-urgent messages can be sent to your provider as well.   To learn more about what you can do with MyChart, go to [email or web address].    Your next appointment:   6 month(s)  The format for your next appointment:   In Person  Provider:   Dina RichJonathan Branch, MD or Randall AnBrittany Miraj Truss, PA-C   Other Instructions Thank you for choosing [facility]!       Signed, Ellsworth LennoxBrittany M Zamantha Strebel, PA-C  05/23/2020 8:34 PM    [facility] [address] [place], [place] [address] Phone: [phone] Fax: [phone]

## 2020-05-23 NOTE — Patient Instructions (Signed)
Medication Instructions:  Your physician recommends that you continue on your current medications as directed. Please refer to the Current Medication list given to you today.  *If you need a refill on your cardiac medications before your next appointment, please call your pharmacy*   Lab Work: NONE   If you have labs (blood work) drawn today and your tests are completely normal, you will receive your results only by: . MyChart Message (if you have MyChart) OR . A paper copy in the mail If you have any lab test that is abnormal or we need to change your treatment, we will call you to review the results.   Testing/Procedures: NONE    Follow-Up: At [facility], you and your health needs are our priority.  As part of our continuing mission to provide you with exceptional heart care, we have created designated Provider Care Teams.  These Care Teams include your primary Cardiologist (physician) and Advanced Practice Providers (APPs -  Physician Assistants and Nurse Practitioners) who all work together to provide you with the care you need, when you need it.  We recommend signing up for the patient portal called "MyChart".  Sign up information is provided on this After Visit Summary.  MyChart is used to connect with patients for Virtual Visits (Telemedicine).  Patients are able to view lab/test results, encounter notes, upcoming appointments, etc.  Non-urgent messages can be sent to your provider as well.   To learn more about what you can do with MyChart, go to [email or web address].    Your next appointment:   6 month(s)  The format for your next appointment:   In Person  Provider:   Jonathan Branch, MD or Brittany Strader, PA-C   Other Instructions Thank you for choosing [facility]!    

## 2020-10-25 ENCOUNTER — Telehealth: Payer: Self-pay | Admitting: Cardiology

## 2020-10-25 ENCOUNTER — Other Ambulatory Visit: Payer: Self-pay

## 2020-10-25 MED ORDER — NITROGLYCERIN 0.4 MG SL SUBL: 0.4000 mg | SUBLINGUAL_TABLET | SUBLINGUAL | 3 refills | Status: AC | PRN

## 2020-10-25 NOTE — Telephone Encounter (Signed)
Medication refill for Nitroglycerin approved and sent to [facility].

## 2020-10-25 NOTE — Telephone Encounter (Signed)
*  STAT* If patient is at the pharmacy, call can be transferred to refill team.   1. Which medications need to be refilled? (please list name of each medication and dose if known) nitroglycerin  2. Which pharmacy/location (including street and city if local pharmacy) is medication to be sent to? [facility]  3. Do they need a 30 day or 90 day supply? 90

## 2020-10-25 NOTE — Telephone Encounter (Signed)
Patient called back and left different number , her daughter has not come home yet

## 2020-11-09 ENCOUNTER — Other Ambulatory Visit: Payer: Self-pay

## 2020-11-09 ENCOUNTER — Encounter: Payer: Self-pay | Admitting: Student

## 2020-11-09 ENCOUNTER — Telehealth (INDEPENDENT_AMBULATORY_CARE_PROVIDER_SITE_OTHER): Payer: Medicare Other | Admitting: Student

## 2020-11-09 VITALS — Ht <= 58 in | Wt 144.0 lb

## 2020-11-09 DIAGNOSIS — E785 Hyperlipidemia, unspecified: Secondary | ICD-10-CM

## 2020-11-09 DIAGNOSIS — I2510 Atherosclerotic heart disease of native coronary artery without angina pectoris: Secondary | ICD-10-CM | POA: Diagnosis not present

## 2020-11-09 DIAGNOSIS — I10 Essential (primary) hypertension: Secondary | ICD-10-CM | POA: Diagnosis not present

## 2020-11-09 NOTE — Patient Instructions (Signed)
Medication Instructions:  Your physician recommends that you continue on your current medications as directed. Please refer to the Current Medication list given to you today.  *If you need a refill on your cardiac medications before your next appointment, please call your pharmacy*   Lab Work: NONE   If you have labs (blood work) drawn today and your tests are completely normal, you will receive your results only by: . MyChart Message (if you have MyChart) OR . A paper copy in the mail If you have any lab test that is abnormal or we need to change your treatment, we will call you to review the results.   Testing/Procedures: NONE    Follow-Up: At [facility], you and your health needs are our priority.  As part of our continuing mission to provide you with exceptional heart care, we have created designated Provider Care Teams.  These Care Teams include your primary Cardiologist (physician) and Advanced Practice Providers (APPs -  Physician Assistants and Nurse Practitioners) who all work together to provide you with the care you need, when you need it.  We recommend signing up for the patient portal called "MyChart".  Sign up information is provided on this After Visit Summary.  MyChart is used to connect with patients for Virtual Visits (Telemedicine).  Patients are able to view lab/test results, encounter notes, upcoming appointments, etc.  Non-urgent messages can be sent to your provider as well.   To learn more about what you can do with MyChart, go to [email or web address].    Your next appointment:   6 month(s)  The format for your next appointment:   In Person  Provider:   Jonathan Branch, MD   Other Instructions Thank you for choosing [facility]!    

## 2020-11-09 NOTE — Progress Notes (Signed)
Virtual Visit via Telephone Note   This visit type was conducted due to national recommendations for restrictions regarding the COVID-19 Pandemic (e.g. social distancing) in an effort to limit this patient's exposure and mitigate transmission in our community.  Due to her co-morbid illnesses, this patient is at least at moderate risk for complications without adequate follow up.  This format is felt to be most appropriate for this patient at this time.  The patient did not have access to video technology/had technical difficulties with video requiring transitioning to audio format only (telephone).  All issues noted in this document were discussed and addressed.  No physical exam could be performed with this format.  Please refer to the patient's chart for her  consent to telehealth for [facility].    Date:  11/09/2020   ID:  [name] [name], DOB 07-15-1934, MRN [id number] The patient was identified using 2 identifiers.  Patient Location: Home Provider Location: Office/Clinic  PCP:  [facility]  Cardiologist: Previously followed by Dr. Okey Dupre but wishes to follow-up in [facility]  Evaluation Performed:  Follow-Up Visit  Chief Complaint: 6 month visit  History of Present Illness:    [name] [name] is a 87 y.o. female with past medical history of CAD (s/p DES to LAD in 2008, NST in 2015 and 2017 which were low-risk), history of ICM and EF normalized by repeat imaging, HTN and HLD who presents for a 82-month follow-up telehealth visit.   She most recently had an office visit with myself in 05/2020 and denied any recent chest pain or dyspnea on exertion at that time. She was continued on her current medication regimen with ASA 81mg  daily, Plavix 75mg  daily, Imdur 30mg  daily, Enalapril 20mg  daily and Spironolactone 25mg  daily.   She switched from an in office to virtual visit today as she reports having a sinus infection. Says her breathing has been stable and  denies any fever or chills. She denies any recent chest pain or dyspnea on exertion. Is not overly active at baseline but still cooks and helps with cleaning. No recent orthopnea, PND or lower extremity edema.   The patient does not have symptoms concerning for COVID-19 infection (fever, chills, cough, or new shortness of breath).    Past Medical History:  Diagnosis Date  . Ankle edema   . Arthritis    right hip  . Bronchitis    in Dec  . CAD (coronary artery disease)    a. s/p DES to LAD in 2008, NST in 2015 and 2017 which were low-risk  . Carotid stenosis    mild  . Cataract    left;not ready to come off  . Constipation    takes OTC stool softener as needed  . Diabetes mellitus    pt doesn't take any meds for it  . DJD (degenerative joint disease) of hip    right  . Dry cough   . GERD (gastroesophageal reflux disease)   . Headache(784.0)    occasionally  . HTN (hypertension)    takes Amlodpine and Carvedilol daily  . Hx of cardiovascular stress test    Lexiscan Myoview (04/2014):  No ischemia, EF 64%, Low Risk  . Hypercholesteremia    takes Crestor daily  . Hypothyroidism    takes Synthroid daily  . Ileus, postoperative (HCC) 05/12/2014  . Myocardial infarction [facility])    pt unsure but thinks about 71yrs ago;is taking Plavix daily   . Nocturia   .  Osteoarthritis of left hip 05/09/2014  . Primary osteoarthritis of right hip 10/27/2011   Past Surgical History:  Procedure Laterality Date  . ABDOMINAL HYSTERECTOMY  20+yrs  . BACK SURGERY  15+yrs ago  . CARDIAC CATHETERIZATION  2008  . CORONARY ARTERY BYPASS GRAFT    . LYMPH NODE DISSECTION  20+yrs ago   right   . TOTAL HIP ARTHROPLASTY  10/27/2011   Procedure: TOTAL HIP ARTHROPLASTY;  Surgeon: Eulas Post, MD;  Location: [facility] OR;  Service: Orthopedics;  Laterality: Right;  . TOTAL HIP ARTHROPLASTY Left 05/09/2014   Procedure: LEFT TOTAL HIP ARTHROPLASTY;  Surgeon: Eulas Post, MD;  Location: [facility] OR;  Service: Orthopedics;   Laterality: Left;     No outpatient medications have been marked as taking for the 11/09/20 encounter (Telemedicine) with [place] Ouch, Lennart Pall, PA-C.     Allergies:   Patient has no known allergies.   Social History   Tobacco Use  . Smoking status: Never Smoker  . Smokeless tobacco: Never Used  Vaping Use  . Vaping Use: Never used  Substance Use Topics  . Alcohol use: No  . Drug use: No     Family Hx: The patient's family history includes Diabetes in her brother; Heart attack in her brother and sister; Hyperlipidemia in her sister; Hypertension in her brother, daughter, father, mother, sister, and son; Stroke in her mother. There is no history of Anesthesia problems, Hypotension, Malignant hyperthermia, or Pseudochol deficiency.  ROS:   Please see the history of present illness.     All other systems reviewed and are negative.   Prior CV studies:   The following studies were reviewed today:  Echocardiogram: 01/2016 Study Conclusions   - Left ventricle: The cavity size was normal. There was mild focal  basal hypertrophy of the septum. Systolic function was normal.  The estimated ejection fraction was in the range of 60% to 65%.  Wall motion was normal; there were no regional wall motion  abnormalities. There was an increased relative contribution of  atrial contraction to ventricular filling. Doppler parameters are  consistent with abnormal left ventricular relaxation (grade 1  diastolic dysfunction). Doppler parameters are consistent with  high ventricular filling pressure.  - Mitral valve: Minimal focal calcification of the anterior leaflet  (medial segment(s)). There was trivial regurgitation.  - Pulmonic valve: There was no regurgitation.    NST: 01/2016  Nuclear stress EF: 69%.  This is a low risk study.  The left ventricular ejection fraction is hyperdynamic (>65%).  There was no ST segment deviation noted during stress.  The study is  normal.   Small region of mid anteroseptal thinning that improves consistent with samll region of ischemia; cannot exclude shifting soft tissue  Mild apical thinning that improves but does not appear significant for ischemia  LVEF normal  Ovreall low risk scan     Labs/Other Tests and Data Reviewed:    EKG:  No ECG reviewed.  Recent Labs: No results found for requested labs within last 8760 hours.   Recent Lipid Panel Lab Results  Component Value Date/Time   CHOL 180 08/23/2018 03:37 PM   TRIG 95 08/23/2018 03:37 PM   HDL 49 08/23/2018 03:37 PM   CHOLHDL 3.7 08/23/2018 03:37 PM   CHOLHDL 2.5 01/10/2016 08:42 AM   LDLCALC 112 (H) 08/23/2018 03:37 PM    Wt Readings from Last 3 Encounters:  11/09/20 144 lb (65.3 kg)  05/23/20 142 lb 9.6 oz (64.7 kg)  04/28/20 145 lb (  65.8 kg)     Objective:    Vital Signs:  Ht 4\' 9"  (1.448 m)   Wt 144 lb (65.3 kg)   BMI 31.16 kg/m    General: Pleasant female sounding in NAD Psych: Normal affect. Neuro: Alert and oriented X 3.  Lungs:  Resp regular and unlabored while talking on the phone.   ASSESSMENT & PLAN:    1. CAD - She is s/p DES to LAD in 2008 with most recent ischemic evaluation being a low-risk NST in 01/2016. She denies any recent chest pain or dyspnea on exertion.  - Continue current medication regimen with ASA 81mg  daily, Plavix 75mg  daily (has been on DAPT given residual disease by prior cath and recommended by Dr. 02/2016 to continue indefinitely by review of prior notes), Imdur 30mg  daily and Crestor 40mg  daily.   2. HTN - She did not have a way of checking her blood pressure at home today but says it has overall been well controlled at recent office visits. Will continue her current medication regimen with Amlodipine 10 mg daily, Enalapril 20 mg daily, Imdur 30 mg daily and Spironolactone 25 mg daily.  3. HLD - Will request a copy of most recent labs from her PCP. She remains on Zetia 10mg  daily and Crestor 40mg  daily with  goal LDL less than 70 given her known CAD.    COVID-19 Education: The signs and symptoms of COVID-19 were discussed with the patient and how to seek care for testing (follow up with PCP or arrange E-visit).  The importance of social distancing was discussed today.  Time:   Today, I have spent 9 minutes with the patient with telehealth technology discussing the above problems.     Medication Adjustments/Labs and Tests Ordered: Current medicines are reviewed at length with the patient today.  Concerns regarding medicines are outlined above.   Tests Ordered: No orders of the defined types were placed in this encounter.   Medication Changes: No orders of the defined types were placed in this encounter.   Follow Up:  In Person in 6 month(s)  Signed, , Okey Dupre  11/09/2020 3:57 PM    [facility]

## 2021-05-06 ENCOUNTER — Ambulatory Visit: Payer: Medicare Other | Admitting: Cardiology

## 2021-06-16 ENCOUNTER — Inpatient Hospital Stay (HOSPITAL_COMMUNITY)
Admission: EM | Admit: 2021-06-16 | Discharge: 2021-06-18 | DRG: 066 | Disposition: A | Discharge status: Home / Self Care | Payer: Medicare Other | Attending: Internal Medicine | Admitting: Internal Medicine

## 2021-06-16 ENCOUNTER — Other Ambulatory Visit: Payer: Self-pay

## 2021-06-16 ENCOUNTER — Encounter (HOSPITAL_COMMUNITY): Payer: Self-pay | Admitting: *Deleted

## 2021-06-16 ENCOUNTER — Emergency Department (HOSPITAL_COMMUNITY): Payer: Medicare Other

## 2021-06-16 DIAGNOSIS — Z20822 Contact with and (suspected) exposure to covid-19: Secondary | ICD-10-CM | POA: Diagnosis present

## 2021-06-16 DIAGNOSIS — E039 Hypothyroidism, unspecified: Secondary | ICD-10-CM | POA: Diagnosis present

## 2021-06-16 DIAGNOSIS — R4701 Aphasia: Secondary | ICD-10-CM | POA: Diagnosis present

## 2021-06-16 DIAGNOSIS — E785 Hyperlipidemia, unspecified: Secondary | ICD-10-CM | POA: Diagnosis not present

## 2021-06-16 DIAGNOSIS — I252 Old myocardial infarction: Secondary | ICD-10-CM

## 2021-06-16 DIAGNOSIS — R4781 Slurred speech: Secondary | ICD-10-CM

## 2021-06-16 DIAGNOSIS — G459 Transient cerebral ischemic attack, unspecified: Secondary | ICD-10-CM | POA: Diagnosis present

## 2021-06-16 DIAGNOSIS — R471 Dysarthria and anarthria: Secondary | ICD-10-CM | POA: Diagnosis present

## 2021-06-16 DIAGNOSIS — Z83438 Family history of other disorder of lipoprotein metabolism and other lipidemia: Secondary | ICD-10-CM

## 2021-06-16 DIAGNOSIS — I10 Essential (primary) hypertension: Secondary | ICD-10-CM | POA: Diagnosis present

## 2021-06-16 DIAGNOSIS — Z951 Presence of aortocoronary bypass graft: Secondary | ICD-10-CM

## 2021-06-16 DIAGNOSIS — I6389 Other cerebral infarction: Principal | ICD-10-CM | POA: Diagnosis present

## 2021-06-16 DIAGNOSIS — K219 Gastro-esophageal reflux disease without esophagitis: Secondary | ICD-10-CM | POA: Diagnosis present

## 2021-06-16 DIAGNOSIS — R739 Hyperglycemia, unspecified: Secondary | ICD-10-CM

## 2021-06-16 DIAGNOSIS — Z96643 Presence of artificial hip joint, bilateral: Secondary | ICD-10-CM | POA: Diagnosis present

## 2021-06-16 DIAGNOSIS — R519 Headache, unspecified: Secondary | ICD-10-CM | POA: Insufficient documentation

## 2021-06-16 DIAGNOSIS — R29701 NIHSS score 1: Secondary | ICD-10-CM | POA: Diagnosis not present

## 2021-06-16 DIAGNOSIS — R29810 Facial weakness: Secondary | ICD-10-CM | POA: Diagnosis present

## 2021-06-16 DIAGNOSIS — Z833 Family history of diabetes mellitus: Secondary | ICD-10-CM

## 2021-06-16 DIAGNOSIS — Z955 Presence of coronary angioplasty implant and graft: Secondary | ICD-10-CM

## 2021-06-16 DIAGNOSIS — I639 Cerebral infarction, unspecified: Secondary | ICD-10-CM | POA: Diagnosis present

## 2021-06-16 DIAGNOSIS — Z79899 Other long term (current) drug therapy: Secondary | ICD-10-CM

## 2021-06-16 DIAGNOSIS — E7800 Pure hypercholesterolemia, unspecified: Secondary | ICD-10-CM | POA: Diagnosis present

## 2021-06-16 DIAGNOSIS — Z8249 Family history of ischemic heart disease and other diseases of the circulatory system: Secondary | ICD-10-CM

## 2021-06-16 DIAGNOSIS — I2510 Atherosclerotic heart disease of native coronary artery without angina pectoris: Secondary | ICD-10-CM | POA: Diagnosis present

## 2021-06-16 DIAGNOSIS — Z823 Family history of stroke: Secondary | ICD-10-CM

## 2021-06-16 DIAGNOSIS — Z7902 Long term (current) use of antithrombotics/antiplatelets: Secondary | ICD-10-CM

## 2021-06-16 DIAGNOSIS — R29700 NIHSS score 0: Secondary | ICD-10-CM | POA: Diagnosis present

## 2021-06-16 DIAGNOSIS — Z90710 Acquired absence of both cervix and uterus: Secondary | ICD-10-CM

## 2021-06-16 DIAGNOSIS — E1165 Type 2 diabetes mellitus with hyperglycemia: Secondary | ICD-10-CM | POA: Diagnosis present

## 2021-06-16 DIAGNOSIS — Z79890 Hormone replacement therapy (postmenopausal): Secondary | ICD-10-CM

## 2021-06-16 DIAGNOSIS — Z7982 Long term (current) use of aspirin: Secondary | ICD-10-CM

## 2021-06-16 LAB — CBC
HCT: 39.9 % (ref 36.0–46.0)
Hemoglobin: 12.7 g/dL (ref 12.0–15.0)
MCH: 31.6 pg (ref 26.0–34.0)
MCHC: 31.8 g/dL (ref 30.0–36.0)
MCV: 99.3 fL (ref 80.0–100.0)
Platelets: 198 10*3/uL (ref 150–400)
RBC: 4.02 MIL/uL (ref 3.87–5.11)
RDW: 12.5 % (ref 11.5–15.5)
WBC: 4.7 10*3/uL (ref 4.0–10.5)
nRBC: 0 % (ref 0.0–0.2)

## 2021-06-16 LAB — RAPID URINE DRUG SCREEN, HOSP PERFORMED
Amphetamines: NOT DETECTED
Barbiturates: NOT DETECTED
Benzodiazepines: NOT DETECTED
Cocaine: NOT DETECTED
Opiates: NOT DETECTED
Tetrahydrocannabinol: NOT DETECTED

## 2021-06-16 LAB — DIFFERENTIAL
Abs Immature Granulocytes: 0.01 10*3/uL (ref 0.00–0.07)
Basophils Absolute: 0 10*3/uL (ref 0.0–0.1)
Basophils Relative: 0 %
Eosinophils Absolute: 0.1 10*3/uL (ref 0.0–0.5)
Eosinophils Relative: 2 %
Immature Granulocytes: 0 %
Lymphocytes Relative: 39 %
Lymphs Abs: 1.8 10*3/uL (ref 0.7–4.0)
Monocytes Absolute: 0.5 10*3/uL (ref 0.1–1.0)
Monocytes Relative: 10 %
Neutro Abs: 2.3 10*3/uL (ref 1.7–7.7)
Neutrophils Relative %: 49 %

## 2021-06-16 LAB — COMPREHENSIVE METABOLIC PANEL
ALT: 15 U/L (ref 0–44)
AST: 21 U/L (ref 15–41)
Albumin: 4.1 g/dL (ref 3.5–5.0)
Alkaline Phosphatase: 71 U/L (ref 38–126)
Anion gap: 5 (ref 5–15)
BUN: 12 mg/dL (ref 8–23)
CO2: 27 mmol/L (ref 22–32)
Calcium: 8.5 mg/dL — ABNORMAL LOW (ref 8.9–10.3)
Chloride: 106 mmol/L (ref 98–111)
Creatinine, Ser: 0.73 mg/dL (ref 0.44–1.00)
GFR, Estimated: >60 mL/min (ref >60)
Glucose, Bld: 197 mg/dL — ABNORMAL HIGH (ref 70–99)
Potassium: 3.8 mmol/L (ref 3.5–5.1)
Sodium: 138 mmol/L (ref 135–145)
Total Bilirubin: 0.3 mg/dL (ref 0.3–1.2)
Total Protein: 8.1 g/dL (ref 6.5–8.1)

## 2021-06-16 LAB — URINALYSIS, ROUTINE W REFLEX MICROSCOPIC
Bilirubin Urine: NEGATIVE
Glucose, UA: 100 mg/dL — AB
Hgb urine dipstick: NEGATIVE
Ketones, ur: NEGATIVE mg/dL
Leukocytes,Ua: NEGATIVE
Nitrite: NEGATIVE
Protein, ur: NEGATIVE mg/dL
Specific Gravity, Urine: 1.02 (ref 1.005–1.030)
pH: 7 (ref 5.0–8.0)

## 2021-06-16 LAB — CBG MONITORING, ED: Glucose-Capillary: 192 mg/dL — ABNORMAL HIGH (ref 70–99)

## 2021-06-16 LAB — PROTIME-INR
INR: 1.1 (ref 0.8–1.2)
Prothrombin Time: 13.7 seconds (ref 11.4–15.2)

## 2021-06-16 LAB — ETHANOL: Alcohol, Ethyl (B): <10 mg/dL (ref <10)

## 2021-06-16 LAB — APTT: aPTT: 30 seconds (ref 24–36)

## 2021-06-16 MED ORDER — EZETIMIBE 10 MG PO TABS
10.0000 mg | ORAL_TABLET | Freq: Every day | ORAL | Status: DC
  Administered 2021-06-17 – 2021-06-18 (×2): 10 mg via ORAL
  Filled 2021-06-16 (×2): qty 1

## 2021-06-16 MED ORDER — ACETAMINOPHEN 325 MG PO TABS
650.0000 mg | ORAL_TABLET | Freq: Four times a day (QID) | ORAL | Status: DC | PRN
  Administered 2021-06-17 – 2021-06-18 (×2): 650 mg via ORAL
  Filled 2021-06-16 (×2): qty 2

## 2021-06-16 MED ORDER — LEVOTHYROXINE SODIUM 75 MCG PO TABS
150.0000 ug | ORAL_TABLET | Freq: Every day | ORAL | Status: DC
  Administered 2021-06-17 – 2021-06-18 (×2): 150 ug via ORAL
  Filled 2021-06-16 (×2): qty 2

## 2021-06-16 MED ORDER — CLOPIDOGREL BISULFATE 75 MG PO TABS
75.0000 mg | ORAL_TABLET | Freq: Every day | ORAL | Status: DC
  Administered 2021-06-17 – 2021-06-18 (×2): 75 mg via ORAL
  Filled 2021-06-16 (×2): qty 1

## 2021-06-16 MED ORDER — ROSUVASTATIN CALCIUM 20 MG PO TABS
40.0000 mg | ORAL_TABLET | Freq: Every day | ORAL | Status: DC
  Administered 2021-06-17 – 2021-06-18 (×2): 40 mg via ORAL
  Filled 2021-06-16 (×2): qty 2

## 2021-06-16 MED ORDER — ASPIRIN EC 81 MG PO TBEC
81.0000 mg | DELAYED_RELEASE_TABLET | Freq: Every day | ORAL | Status: DC
  Administered 2021-06-17 – 2021-06-18 (×2): 81 mg via ORAL
  Filled 2021-06-16 (×2): qty 1

## 2021-06-16 NOTE — H&P (Signed)
History and Physical  [name] [name] HDQ:[id number] DOB: 07/06/1934 DOA: 06/16/2021  Referring physician: Pricilla Loveless, MD PCP: Oneal Grout, FNP  Patient coming from: Home  Chief Complaint: Slurred speech  HPI: [name] [name] is a 87 y.o. female with medical history significant for hypertension, hyperlipidemia, hypothyroidism, CAD s/p DES to LAD (2008) who presents to the emergency department due to slurred speech noticed by family around 9 PM last night.  Patient's family noted patient presenting with slurred speech with difficulty to understand around 9 PM to 10 PM yesterday (8/10), she also had difficulty to pick things up with left hand and gait was unsteady.  She went to bed and woke up this morning still with difficulty with speech around 6:30 AM, she complained of generalized weakness, but speech appeared to improve throughout the day.  Son thought that he saw a left-sided facial droop and she was taken to the ED for further evaluation and management.  Patient denies chest pain, shortness of breath, nausea, vomiting or any facial pain/weakness  ED Course:  In the emergency department, initial BP was elevated at 187/72, but other vital signs are within normal range.  Work-up in the ED showed normal CBC and normal BMP except for hyperglycemia, alcohol level was < 10. CT of head without contrast showed small age indeterminate infarcts within the right frontal lobe and right caudate. Hospitalist was asked to admit patient for further evaluation and management.  Review of Systems: Constitutional: Negative for chills and fever.  HENT: Negative for ear pain and sore throat.   Eyes: Negative for pain and visual disturbance.  Respiratory: Negative for cough, chest tightness and shortness of breath.   Cardiovascular: Negative for chest pain and palpitations.  Gastrointestinal: Negative for abdominal pain and vomiting.  Endocrine: Negative for polyphagia and polyuria.  Genitourinary:  Negative for decreased urine volume, dysuria, enuresis Musculoskeletal: Negative for arthralgias and back pain.  Skin: Negative for color change and rash.  Allergic/Immunologic: Negative for immunocompromised state.  Neurological: Positive for speech difficulty( resolved).  Negative for tremors, syncope Hematological: Does not bruise/bleed easily.  All other systems reviewed and are negative   Past Medical History:  Diagnosis Date   Ankle edema    Arthritis    right hip   Bronchitis    in Dec   CAD (coronary artery disease)    a. s/p DES to LAD in 2008, NST in 2015 and 2017 which were low-risk   Carotid stenosis    mild   Cataract    left;not ready to come off   Constipation    takes OTC stool softener as needed   Diabetes mellitus    pt doesn't take any meds for it   DJD (degenerative joint disease) of hip    right   Dry cough    GERD (gastroesophageal reflux disease)    Headache(784.0)    occasionally   HTN (hypertension)    takes Amlodpine and Carvedilol daily   Hx of cardiovascular stress test    Lexiscan Myoview (04/2014):  No ischemia, EF 64%, Low Risk   Hypercholesteremia    takes Crestor daily   Hypothyroidism    takes Synthroid daily   Ileus, postoperative (HCC) 05/12/2014   Myocardial infarction [facility])    pt unsure but thinks about 66yrs ago;is taking Plavix daily    Nocturia    Osteoarthritis of left hip 05/09/2014   Primary osteoarthritis of right hip 10/27/2011   Past Surgical History:  Procedure Laterality Date  ABDOMINAL HYSTERECTOMY  20+yrs   BACK SURGERY  15+yrs ago   CARDIAC CATHETERIZATION  2008   CORONARY ARTERY BYPASS GRAFT     LYMPH NODE DISSECTION  20+yrs ago   right    TOTAL HIP ARTHROPLASTY  10/27/2011   Procedure: TOTAL HIP ARTHROPLASTY;  Surgeon: Eulas Post, MD;  Location: [facility] OR;  Service: Orthopedics;  Laterality: Right;   TOTAL HIP ARTHROPLASTY Left 05/09/2014   Procedure: LEFT TOTAL HIP ARTHROPLASTY;  Surgeon: Eulas Post, MD;   Location: [facility] OR;  Service: Orthopedics;  Laterality: Left;    Social History:  reports that she has never smoked. She has never used smokeless tobacco. She reports that she does not drink alcohol and does not use drugs.   No Known Allergies  Family History  Problem Relation Age of Onset   Hypertension Mother    Stroke Mother    Hypertension Father    Heart attack Brother    Heart attack Sister    Hypertension Sister    Hypertension Brother    Hypertension Daughter    Hypertension Son    Hyperlipidemia Sister    Diabetes Brother    Anesthesia problems Neg Hx    Hypotension Neg Hx    Malignant hyperthermia Neg Hx    Pseudochol deficiency Neg Hx      Prior to Admission medications   Medication Sig Start Date End Date Taking? Authorizing Provider  amLODipine (NORVASC) 10 MG tablet Take 1 tablet (10 mg total) by mouth daily. 09/28/19  Yes Netta Neat., NP  aspirin 81 MG tablet Take 81 mg by mouth daily.   Yes [provider]  clopidogrel (PLAVIX) 75 MG tablet TAKE 1 TABLET BY MOUTH ONCE DAILY. Patient taking differently: Take 75 mg by mouth daily. 10/10/19  Yes Laqueta Linden, MD  ezetimibe (ZETIA) 10 MG tablet TAKE 1 TABLET BY MOUTH ONCE DAILY. Patient taking differently: Take 10 mg by mouth daily. 11/30/19  Yes Laqueta Linden, MD  fluticasone (FLONASE) 50 MCG/ACT nasal spray Place 1 spray into both nostrils daily. Patient taking differently: Place 1 spray into both nostrils daily as needed for allergies or rhinitis. 04/28/20  Yes Graciella Freer A, PA-C  isosorbide mononitrate (IMDUR) 30 MG 24 hr tablet TAKE 1 TABLET BY MOUTH ONCE DAILY. Patient taking differently: Take 30 mg by mouth daily. 03/08/20  Yes Laqueta Linden, MD  levothyroxine (SYNTHROID) 150 MCG tablet Take 150 mcg by mouth daily.   Yes [provider]  nitroGLYCERIN (NITROSTAT) 0.4 MG SL tablet Place 1 tablet (0.4 mg total) under the tongue every 5 (five) minutes as needed for  chest pain. 10/25/20 06/16/21 Yes Branch, Dorothe Pea, MD  rosuvastatin (CRESTOR) 40 MG tablet TAKE 1 TABLET BY MOUTH ONCE DAILY. Patient taking differently: Take 40 mg by mouth daily. 11/30/19  Yes Laqueta Linden, MD  spironolactone (ALDACTONE) 25 MG tablet TAKE 1 TABLET BY MOUTH ONCE DAILY. Patient taking differently: Take 25 mg by mouth daily. 10/10/19  Yes Laqueta Linden, MD  enalapril (VASOTEC) 20 MG tablet TAKE 1 TABLET BY MOUTH ONCE DAILY. 03/07/20   Laqueta Linden, MD    Physical Exam: BP (!) 164/66   Pulse (!) 54   Temp 98.3 F (36.8 C) (Oral)   Resp 18   Ht 5' (1.524 m)   Wt 65.8 kg   SpO2 100%   BMI 28.32 kg/m   General: 87 y.o. year-old female well developed well nourished in no acute  distress.  Alert and oriented x3. HEENT: NCAT, EOMI Neck: Supple, trachea medial Cardiovascular: Regular rate and rhythm with no rubs or gallops.  No thyromegaly or JVD noted.  No lower extremity edema. 2/4 pulses in all 4 extremities. Respiratory: Clear to auscultation with no wheezes or rales. Good inspiratory effort. Abdomen: Soft, nontender nondistended with normal bowel sounds x4 quadrants. Muskuloskeletal: No cyanosis, clubbing or edema noted bilaterally Neuro:  CN II-XII intact, normal finger-to-nose and heel-to-shin movements.  Strength 5/5 x 4, sensation, reflexes intact Skin: No ulcerative lesions noted or rashes Psychiatry: Mood is appropriate for condition and setting          Labs on Admission:  Basic Metabolic Panel: Recent Labs  Lab 06/16/21 1915  NA 138  K 3.8  CL 106  CO2 27  GLUCOSE 197*  BUN 12  CREATININE 0.73  CALCIUM 8.5*   Liver Function Tests: Recent Labs  Lab 06/16/21 1915  AST 21  ALT 15  ALKPHOS 71  BILITOT 0.3  PROT 8.1  ALBUMIN 4.1   No results for input(s): LIPASE, AMYLASE in the last 168 hours. No results for input(s): AMMONIA in the last 168 hours. CBC: Recent Labs  Lab 06/16/21 1915  WBC 4.7  NEUTROABS 2.3  HGB 12.7   HCT 39.9  MCV 99.3  PLT 198   Cardiac Enzymes: No results for input(s): CKTOTAL, CKMB, CKMBINDEX, TROPONINI in the last 168 hours.  BNP (last 3 results) No results for input(s): BNP in the last 8760 hours.  ProBNP (last 3 results) No results for input(s): PROBNP in the last 8760 hours.  CBG: Recent Labs  Lab 06/16/21 1923  GLUCAP 192*    Radiological Exams on Admission: CT HEAD WO CONTRAST  Result Date: 06/16/2021 CLINICAL DATA:  Weakness difficulty speaking EXAM: CT HEAD WITHOUT CONTRAST TECHNIQUE: Contiguous axial images were obtained from the base of the skull through the vertex without intravenous contrast. COMPARISON:  CT brain 01/21/2017 FINDINGS: Brain: No hemorrhage or intracranial mass. Small age indeterminate cortical and subcortical infarct at the right frontal lobe, new since 2018. Age indeterminate lacunar infarct in the right caudate. Mild atrophy and chronic small vessel ischemic changes of the white matter. Nonenlarged ventricles Vascular: No hyperdense vessels. Vertebral and carotid vascular calcification Skull: Normal. Negative for fracture or focal lesion. Sinuses/Orbits: Fluid in the left maxillary sinus Other: None IMPRESSION: 1. Small age indeterminate infarcts within the right frontal lobe and right caudate. 2. Atrophy and chronic small vessel ischemic changes of the white matter Electronically Signed   By: Jasmine Pang M.D.   On: 06/16/2021 19:17    EKG: I independently viewed the EKG done and my findings are as followed: EKG showed sinus rhythm at a rate of 70 bpm and ST abnormalities in Inferior and lateral leads similar to EKG on 05/23/2020.   Assessment/Plan Present on Admission:  Transient ischemic attack  Hyperlipidemia  Essential hypertension  Principal Problem:   Slurred speech Active Problems:   Hyperlipidemia   Essential hypertension   Coronary artery disease   Transient ischemic attack   Hyperglycemia   Hypothyroidism  Slurred speech  possible secondary to transient ischemic attack vs subacute ischemic stroke  CT of head without contrast showed small age indeterminate infarcts within the right frontal lobe and right caudate Patient will be admitted to telemetry unit  Bilateral carotid ultrasound in the morning Echocardiogram in the morning MRI of brain without contrast in the morning Continue aspirin and statin Continue fall precautions and neuro checks Lipid  panel and hemoglobin A1c will be checked Continue PT/SLP/OT eval and treat Bedside swallow eval by nursing prior to diet Neurologist will be consulted and we shall await further recommendation  Hyperglycemia with no history of T2DM CBG 197, this is possibly a reactive process Hemoglobin A1c pending  Essential hypertension Permissive hypertension will be allowed at this time except for BP > 220/110  Hyperlipidemia Continue Crestor and Zetia  Hypothyroidism Continue Synthroid  CAD s/p stent placement Continue aspirin, Plavix, Crestor Imdur and nitroglycerin held at this time due to permissive hypertension   DVT prophylaxis: SCDs  Code Status: Full code  Family Communication: None at bedside  Disposition Plan:                home Anticipated DC to:                   SNF or family members home Anticipated DC date:               2-3 days Anticipated DC barriers:       Patient requires inpatient management due to sudden onset of slurred speech possibly due to subacute stroke requiring further work-up and pending neurology consult  Consults called: Neurology  Admission status: Observation   Frankey Shownladapo Kowen Kluth MD [facility]  06/16/2021, 11:03 PM

## 2021-06-16 NOTE — ED Provider Notes (Signed)
[facility] Provider Note   CSN: [id number] Arrival date & time: 06/16/21  1742     History Chief Complaint  Patient presents with   Aphasia    [name] [name] is a 87 y.o. female.  HPI 87 year old female presents with slurred speech.  The patient seem to have a last known normal around 2100 last night.  Son noticed trouble speaking and he could understand her.  She seemed diffusely weak this morning when he got her up and she was still slurring her words.  It seems like everything is progressively improving.  He thought he saw some left-sided facial droop.  She endorses a chronic headache that is unchanged today.  She denies any other types of pain or any focal weakness.  Past Medical History:  Diagnosis Date   Ankle edema    Arthritis    right hip   Bronchitis    in Dec   CAD (coronary artery disease)    a. s/p DES to LAD in 2008, NST in 2015 and 2017 which were low-risk   Carotid stenosis    mild   Cataract    left;not ready to come off   Constipation    takes OTC stool softener as needed   Diabetes mellitus    pt doesn't take any meds for it   DJD (degenerative joint disease) of hip    right   Dry cough    GERD (gastroesophageal reflux disease)    Headache(784.0)    occasionally   HTN (hypertension)    takes Amlodpine and Carvedilol daily   Hx of cardiovascular stress test    Lexiscan Myoview (04/2014):  No ischemia, EF 64%, Low Risk   Hypercholesteremia    takes Crestor daily   Hypothyroidism    takes Synthroid daily   Ileus, postoperative (HCC) 05/12/2014   Myocardial infarction [facility](HCC)    pt unsure but thinks about 4765yrs ago;is taking Plavix daily    Nocturia    Osteoarthritis of left hip 05/09/2014   Primary osteoarthritis of right hip 10/27/2011    Patient Active Problem List   Diagnosis Date Noted   Transient ischemic attack 06/16/2021   Slurred speech 06/16/2021   Headache 06/16/2021   Hyperglycemia 06/16/2021   Hypothyroidism  06/16/2021   Ischemic cardiomyopathy 08/24/2018   Ectopic atrial rhythm 08/24/2018   Ileus, postoperative (HCC) 05/12/2014   Osteoarthritis of left hip 05/09/2014   Hip arthritis 05/09/2014   Primary osteoarthritis of right hip 10/27/2011   Hip pain 08/24/2011   CHRONIC DIASTOLIC HEART FAILURE 12/24/2009   SHORTNESS OF BREATH 11/29/2009   Hyperlipidemia 09/17/2008   Essential hypertension 09/17/2008   Coronary artery disease 09/17/2008   CAROTID ARTERY STENOSIS 09/17/2008    Past Surgical History:  Procedure Laterality Date   ABDOMINAL HYSTERECTOMY  20+yrs   BACK SURGERY  15+yrs ago   CARDIAC CATHETERIZATION  2008   CORONARY ARTERY BYPASS GRAFT     LYMPH NODE DISSECTION  20+yrs ago   right    TOTAL HIP ARTHROPLASTY  10/27/2011   Procedure: TOTAL HIP ARTHROPLASTY;  Surgeon: Eulas PostJoshua P Landau, MD;  Location: [facility] OR;  Service: Orthopedics;  Laterality: Right;   TOTAL HIP ARTHROPLASTY Left 05/09/2014   Procedure: LEFT TOTAL HIP ARTHROPLASTY;  Surgeon: Eulas PostJoshua P Landau, MD;  Location: [facility] OR;  Service: Orthopedics;  Laterality: Left;     OB History   No obstetric history on file.     Family History  Problem Relation Age of Onset  Hypertension Mother    Stroke Mother    Hypertension Father    Heart attack Brother    Heart attack Sister    Hypertension Sister    Hypertension Brother    Hypertension Daughter    Hypertension Son    Hyperlipidemia Sister    Diabetes Brother    Anesthesia problems Neg Hx    Hypotension Neg Hx    Malignant hyperthermia Neg Hx    Pseudochol deficiency Neg Hx     Social History   Tobacco Use   Smoking status: Never   Smokeless tobacco: Never  Vaping Use   Vaping Use: Never used  Substance Use Topics   Alcohol use: No   Drug use: No    Home Medications Prior to Admission medications   Medication Sig Start Date End Date Taking? Authorizing Provider  amLODipine (NORVASC) 10 MG tablet Take 1 tablet (10 mg total) by mouth daily. 09/28/19   Yes Netta Neat., NP  aspirin 81 MG tablet Take 81 mg by mouth daily.   Yes [provider]  clopidogrel (PLAVIX) 75 MG tablet TAKE 1 TABLET BY MOUTH ONCE DAILY. Patient taking differently: Take 75 mg by mouth daily. 10/10/19  Yes Laqueta Linden, MD  ezetimibe (ZETIA) 10 MG tablet TAKE 1 TABLET BY MOUTH ONCE DAILY. Patient taking differently: Take 10 mg by mouth daily. 11/30/19  Yes Laqueta Linden, MD  fluticasone (FLONASE) 50 MCG/ACT nasal spray Place 1 spray into both nostrils daily. Patient taking differently: Place 1 spray into both nostrils daily as needed for allergies or rhinitis. 04/28/20  Yes Graciella Freer A, PA-C  isosorbide mononitrate (IMDUR) 30 MG 24 hr tablet TAKE 1 TABLET BY MOUTH ONCE DAILY. Patient taking differently: Take 30 mg by mouth daily. 03/08/20  Yes Laqueta Linden, MD  levothyroxine (SYNTHROID) 150 MCG tablet Take 150 mcg by mouth daily.   Yes [provider]  nitroGLYCERIN (NITROSTAT) 0.4 MG SL tablet Place 1 tablet (0.4 mg total) under the tongue every 5 (five) minutes as needed for chest pain. 10/25/20 06/16/21 Yes Branch, Dorothe Pea, MD  rosuvastatin (CRESTOR) 40 MG tablet TAKE 1 TABLET BY MOUTH ONCE DAILY. Patient taking differently: Take 40 mg by mouth daily. 11/30/19  Yes Laqueta Linden, MD  spironolactone (ALDACTONE) 25 MG tablet TAKE 1 TABLET BY MOUTH ONCE DAILY. Patient taking differently: Take 25 mg by mouth daily. 10/10/19  Yes Laqueta Linden, MD  enalapril (VASOTEC) 20 MG tablet TAKE 1 TABLET BY MOUTH ONCE DAILY. 03/07/20   Laqueta Linden, MD    Allergies    Patient has no known allergies.  Review of Systems   Review of Systems  Constitutional:  Negative for fever.  Eyes:  Negative for visual disturbance.  Neurological:  Positive for speech difficulty and headaches. Negative for weakness and numbness.  All other systems reviewed and are negative.  Physical Exam Updated Vital Signs BP 134/69 (BP  Location: Right Arm)   Pulse (!) 51   Temp 97.7 F (36.5 C) (Oral)   Resp 18   Ht 5' (1.524 m)   Wt 65.8 kg   SpO2 97%   BMI 28.32 kg/m   Physical Exam Vitals and nursing note reviewed.  Constitutional:      General: She is not in acute distress.    Appearance: She is well-developed. She is not ill-appearing or diaphoretic.  HENT:     Head: Normocephalic and atraumatic.     Right Ear: External ear  normal.     Left Ear: External ear normal.     Nose: Nose normal.  Eyes:     General:        Right eye: No discharge.        Left eye: No discharge.     Extraocular Movements: Extraocular movements intact.     Pupils: Pupils are equal, round, and reactive to light.  Cardiovascular:     Rate and Rhythm: Normal rate and regular rhythm.     Heart sounds: Normal heart sounds.  Pulmonary:     Effort: Pulmonary effort is normal.     Breath sounds: Normal breath sounds.  Abdominal:     Palpations: Abdomen is soft.     Tenderness: There is no abdominal tenderness.  Skin:    General: Skin is warm and dry.  Neurological:     Mental Status: She is alert and oriented to person, place, and time.     Comments: Mildly slurred speech. Questionable left facial weakness, but is minimal. 5/5 strength in all 4 extremities. Grossly normal sensation. Normal finger to nose.   Psychiatric:        Mood and Affect: Mood is not anxious.    ED Results / Procedures / Treatments   Labs (all labs ordered are listed, but only abnormal results are displayed) Labs Reviewed  COMPREHENSIVE METABOLIC PANEL - Abnormal; Notable for the following components:      Result Value   Glucose, Bld 197 (*)    Calcium 8.5 (*)    All other components within normal limits  URINALYSIS, ROUTINE W REFLEX MICROSCOPIC - Abnormal; Notable for the following components:   Glucose, UA 100 (*)    All other components within normal limits  CBG MONITORING, ED - Abnormal; Notable for the following components:   Glucose-Capillary  192 (*)    All other components within normal limits  SARS CORONAVIRUS 2 (TAT 6-24 HRS)  ETHANOL  PROTIME-INR  APTT  CBC  DIFFERENTIAL  RAPID URINE DRUG SCREEN, HOSP PERFORMED  HEMOGLOBIN A1C  LIPID PANEL  COMPREHENSIVE METABOLIC PANEL  CBC  PROTIME-INR  APTT  MAGNESIUM  PHOSPHORUS    EKG None  Radiology CT HEAD WO CONTRAST  Result Date: 06/16/2021 CLINICAL DATA:  Weakness difficulty speaking EXAM: CT HEAD WITHOUT CONTRAST TECHNIQUE: Contiguous axial images were obtained from the base of the skull through the vertex without intravenous contrast. COMPARISON:  CT brain 01/21/2017 FINDINGS: Brain: No hemorrhage or intracranial mass. Small age indeterminate cortical and subcortical infarct at the right frontal lobe, new since 2018. Age indeterminate lacunar infarct in the right caudate. Mild atrophy and chronic small vessel ischemic changes of the white matter. Nonenlarged ventricles Vascular: No hyperdense vessels. Vertebral and carotid vascular calcification Skull: Normal. Negative for fracture or focal lesion. Sinuses/Orbits: Fluid in the left maxillary sinus Other: None IMPRESSION: 1. Small age indeterminate infarcts within the right frontal lobe and right caudate. 2. Atrophy and chronic small vessel ischemic changes of the white matter Electronically Signed   By: Jasmine Pang M.D.   On: 06/16/2021 19:17    Procedures Procedures   Medications Ordered in ED Medications  aspirin EC tablet 81 mg (has no administration in time range)  acetaminophen (TYLENOL) tablet 650 mg (has no administration in time range)  ezetimibe (ZETIA) tablet 10 mg (has no administration in time range)  rosuvastatin (CRESTOR) tablet 40 mg (has no administration in time range)  levothyroxine (SYNTHROID) tablet 150 mcg (has no administration in time range)  clopidogrel (  PLAVIX) tablet 75 mg (has no administration in time range)    ED Course  I have reviewed the triage vital signs and the nursing  notes.  Pertinent labs & imaging results that were available during my care of the patient were reviewed by me and considered in my medical decision making (see chart for details).    MDM Rules/Calculators/A&P                           Patient's symptoms essentially seem resolved. Concerned for TIA. CT with possible subacute stroke. Briefly discussed with Dr. Wilford Corner, can stay at [facility] and get stroke workup/MRI/neuro consult here. Admit.  Final Clinical Impression(s) / ED Diagnoses Final diagnoses:  TIA (transient ischemic attack)    Rx / DC Orders ED Discharge Orders     None        Pricilla Loveless, MD 06/17/21 [phone]

## 2021-06-16 NOTE — ED Triage Notes (Signed)
Pt's family reports last night at around 2100-2200 they couldn't understand pt's speech, couldn't pick things up with the left hand and she was walking with an unsteady gait. Pt went to bed and woke up around 0630 with difficulty speaking. Pt reports she felt weak "all over" this morning. Family and pt report her speech has gotten better throughout the day but about an hour ago her speech starting sounding odd again. Pt has asymmetrical smile, but family and pt report this is her normal due to her "teeth".

## 2021-06-16 NOTE — ED Notes (Signed)
Pt transported to CT ?

## 2021-06-16 NOTE — ED Notes (Signed)
Dr. Criss Alvine notified of pt's symptoms. No Code Stroke at this time.

## 2021-06-16 NOTE — ED Notes (Signed)
ED Provider at bedside. 

## 2021-06-17 ENCOUNTER — Observation Stay (HOSPITAL_COMMUNITY): Payer: Medicare Other

## 2021-06-17 DIAGNOSIS — I10 Essential (primary) hypertension: Secondary | ICD-10-CM

## 2021-06-17 DIAGNOSIS — E7800 Pure hypercholesterolemia, unspecified: Secondary | ICD-10-CM | POA: Diagnosis present

## 2021-06-17 DIAGNOSIS — Z20822 Contact with and (suspected) exposure to covid-19: Secondary | ICD-10-CM | POA: Diagnosis present

## 2021-06-17 DIAGNOSIS — R739 Hyperglycemia, unspecified: Secondary | ICD-10-CM | POA: Diagnosis not present

## 2021-06-17 DIAGNOSIS — R4701 Aphasia: Secondary | ICD-10-CM | POA: Diagnosis present

## 2021-06-17 DIAGNOSIS — E1165 Type 2 diabetes mellitus with hyperglycemia: Secondary | ICD-10-CM | POA: Diagnosis present

## 2021-06-17 DIAGNOSIS — R4781 Slurred speech: Secondary | ICD-10-CM | POA: Diagnosis not present

## 2021-06-17 DIAGNOSIS — Z79890 Hormone replacement therapy (postmenopausal): Secondary | ICD-10-CM | POA: Diagnosis not present

## 2021-06-17 DIAGNOSIS — I639 Cerebral infarction, unspecified: Secondary | ICD-10-CM | POA: Diagnosis not present

## 2021-06-17 DIAGNOSIS — Z955 Presence of coronary angioplasty implant and graft: Secondary | ICD-10-CM | POA: Diagnosis not present

## 2021-06-17 DIAGNOSIS — E039 Hypothyroidism, unspecified: Secondary | ICD-10-CM | POA: Diagnosis present

## 2021-06-17 DIAGNOSIS — Z951 Presence of aortocoronary bypass graft: Secondary | ICD-10-CM | POA: Diagnosis not present

## 2021-06-17 DIAGNOSIS — Z8249 Family history of ischemic heart disease and other diseases of the circulatory system: Secondary | ICD-10-CM | POA: Diagnosis not present

## 2021-06-17 DIAGNOSIS — Z90710 Acquired absence of both cervix and uterus: Secondary | ICD-10-CM | POA: Diagnosis not present

## 2021-06-17 DIAGNOSIS — I6389 Other cerebral infarction: Secondary | ICD-10-CM | POA: Diagnosis present

## 2021-06-17 DIAGNOSIS — I252 Old myocardial infarction: Secondary | ICD-10-CM | POA: Diagnosis not present

## 2021-06-17 DIAGNOSIS — Z833 Family history of diabetes mellitus: Secondary | ICD-10-CM | POA: Diagnosis not present

## 2021-06-17 DIAGNOSIS — R29701 NIHSS score 1: Secondary | ICD-10-CM | POA: Diagnosis not present

## 2021-06-17 DIAGNOSIS — Z823 Family history of stroke: Secondary | ICD-10-CM | POA: Diagnosis not present

## 2021-06-17 DIAGNOSIS — E785 Hyperlipidemia, unspecified: Secondary | ICD-10-CM | POA: Diagnosis present

## 2021-06-17 DIAGNOSIS — G459 Transient cerebral ischemic attack, unspecified: Secondary | ICD-10-CM | POA: Diagnosis present

## 2021-06-17 DIAGNOSIS — Z96643 Presence of artificial hip joint, bilateral: Secondary | ICD-10-CM | POA: Diagnosis present

## 2021-06-17 DIAGNOSIS — R29700 NIHSS score 0: Secondary | ICD-10-CM | POA: Diagnosis present

## 2021-06-17 DIAGNOSIS — I2510 Atherosclerotic heart disease of native coronary artery without angina pectoris: Secondary | ICD-10-CM | POA: Diagnosis present

## 2021-06-17 DIAGNOSIS — R471 Dysarthria and anarthria: Secondary | ICD-10-CM | POA: Diagnosis present

## 2021-06-17 DIAGNOSIS — Z83438 Family history of other disorder of lipoprotein metabolism and other lipidemia: Secondary | ICD-10-CM | POA: Diagnosis not present

## 2021-06-17 DIAGNOSIS — K219 Gastro-esophageal reflux disease without esophagitis: Secondary | ICD-10-CM | POA: Diagnosis present

## 2021-06-17 DIAGNOSIS — R29810 Facial weakness: Secondary | ICD-10-CM | POA: Diagnosis present

## 2021-06-17 LAB — ECHOCARDIOGRAM COMPLETE
AR max vel: 2.29 cm2
AV Area VTI: 2.21 cm2
AV Area mean vel: 2.11 cm2
AV Mean grad: 5 mmHg
AV Peak grad: 8.6 mmHg
Ao pk vel: 1.47 m/s
Area-P 1/2: 3.03 cm2
Height: 60 in
MV VTI: 2.34 cm2
S' Lateral: 2.14 cm
Weight: 2320 oz

## 2021-06-17 LAB — CBC
HCT: 40 % (ref 36.0–46.0)
Hemoglobin: 12.8 g/dL (ref 12.0–15.0)
MCH: 31.8 pg (ref 26.0–34.0)
MCHC: 32 g/dL (ref 30.0–36.0)
MCV: 99.5 fL (ref 80.0–100.0)
Platelets: 206 10*3/uL (ref 150–400)
RBC: 4.02 MIL/uL (ref 3.87–5.11)
RDW: 12.4 % (ref 11.5–15.5)
WBC: 4.7 10*3/uL (ref 4.0–10.5)
nRBC: 0 % (ref 0.0–0.2)

## 2021-06-17 LAB — COMPREHENSIVE METABOLIC PANEL
ALT: 14 U/L (ref 0–44)
AST: 18 U/L (ref 15–41)
Albumin: 3.8 g/dL (ref 3.5–5.0)
Alkaline Phosphatase: 65 U/L (ref 38–126)
Anion gap: 9 (ref 5–15)
BUN: 10 mg/dL (ref 8–23)
CO2: 29 mmol/L (ref 22–32)
Calcium: 9.1 mg/dL (ref 8.9–10.3)
Chloride: 104 mmol/L (ref 98–111)
Creatinine, Ser: 0.63 mg/dL (ref 0.44–1.00)
GFR, Estimated: >60 mL/min (ref >60)
Glucose, Bld: 119 mg/dL — ABNORMAL HIGH (ref 70–99)
Potassium: 4 mmol/L (ref 3.5–5.1)
Sodium: 142 mmol/L (ref 135–145)
Total Bilirubin: 0.7 mg/dL (ref 0.3–1.2)
Total Protein: 7.6 g/dL (ref 6.5–8.1)

## 2021-06-17 LAB — SARS CORONAVIRUS 2 (TAT 6-24 HRS): SARS Coronavirus 2: NEGATIVE

## 2021-06-17 LAB — LIPID PANEL
Cholesterol: 186 mg/dL (ref 0–200)
HDL: 53 mg/dL (ref >40)
LDL Cholesterol: 115 mg/dL — ABNORMAL HIGH (ref 0–99)
Total CHOL/HDL Ratio: 3.5 RATIO
Triglycerides: 91 mg/dL (ref <150)
VLDL: 18 mg/dL (ref 0–40)

## 2021-06-17 LAB — HEMOGLOBIN A1C
Hgb A1c MFr Bld: 6.5 % — ABNORMAL HIGH (ref 4.8–5.6)
Mean Plasma Glucose: 139.85 mg/dL

## 2021-06-17 LAB — PROTIME-INR
INR: 1.1 (ref 0.8–1.2)
Prothrombin Time: 13.8 seconds (ref 11.4–15.2)

## 2021-06-17 LAB — MAGNESIUM: Magnesium: 2.1 mg/dL (ref 1.7–2.4)

## 2021-06-17 LAB — PHOSPHORUS: Phosphorus: 3.5 mg/dL (ref 2.5–4.6)

## 2021-06-17 LAB — APTT: aPTT: 31 seconds (ref 24–36)

## 2021-06-17 MED ORDER — ONDANSETRON HCL 4 MG/2ML IJ SOLN: 4.0000 mg | Freq: Four times a day (QID) | INTRAMUSCULAR | Status: DC | PRN

## 2021-06-17 NOTE — Progress Notes (Signed)
PROGRESS NOTE  [name] [name] RCV:[id number] DOB: [birth year] DOA: 06/16/2021 PCP: Oneal Grout, FNP  Brief History:   87 y.o. female with medical history significant for hypertension, hyperlipidemia, hypothyroidism, CAD s/p DES to LAD (2008) who presents to the emergency department due to slurred speech noticed by family around 9 PM on 06/15/21.  The patient states that she has been in her usual state of health until the evening of 1922 when she began developing the above symptoms.  Apparently, the patient's family noted that she had difficulty picking up objects with her left hand and had an unsteady gait.  She went to bed and woke up in the morning of 06/16/2021 with continued slurred speech.  As of the day went along, her slurred speech has somewhat improved.  Apparently her son also noted that she may have had some facial droop.  As result, the patient was brought to the emergency department for further evaluation.  The patient herself denies any fevers, chills, chest pain, shortness breath, cough, hemoptysis, nausea, vomiting, diarrhea, abdpain, dysuria, hematuria.  She did have right parietal headache which is relieved.  She stated that she has been compliant with all her medications including aspirin and Plavix. In the emergency department, the patient was afebrile hemodynamically stable with oxygen saturation 100% room air.  CT of the brain showed age-indeterminate infarct in the right frontal and Right Caudate nucleus.  As result, the patient was admitted for further treatment and evaluation.  Assessment/Plan: Dysarthria -Concerned about stroke -Neurology Consult -PT/OT evaluation -Speech therapy eval -CT brain-- -MRI brain-- -MRA brain-- -Carotid Duplex-- -Echo-- -LDL-- -HbA1C-- -Antiplatelet--ASA 81 mg & plavix 75  Hypertension -Allowing for permissive hypertension -Holding amlodipine, spironolactone, enalapril temporarily  Hypothyroidism -Continue  Synthroid  Coronary artery disease -No chest pain presently -Restart Imdur -Continue aspirin and Plavix as discussed  Hyperlipidemia -Check lipid panel -Continue statin       Status is: Observation  The patient will require care spanning > 2 midnights and should be moved to inpatient because: Inpatient level of care appropriate due to severity of illness  Dispo: The patient is from: Home              Anticipated d/c is to: Home              Patient currently is not medically stable to d/c.   Difficult to place patient No        Family Communication:   no Family at bedside  Consultants:  neurology  Code Status:  FULL  DVT Prophylaxis:  [place] Heparin /  Lovenox   Procedures: As Listed in Progress Note Above  Antibiotics: None        Subjective: Patient still has some dysarthria.  Denies f/c, cp, sob, n/v/d, headache, visual disturbance.  Objective: Vitals:   06/17/21 0029 06/17/21 0230 06/17/21 0430 06/17/21 0635  BP: 134/69 (!) 150/70 (!) 153/60 (!) 149/50  Pulse: (!) 51 (!) 52 91 (!) 55  Resp: 18 18 18    Temp: 97.7 F (36.5 C) 98 F (36.7 C) 98 F (36.7 C) 97.7 F (36.5 C)  TempSrc: Oral Oral Oral Oral  SpO2: 97% 100% 100% 98%  Weight:      Height:        Intake/Output Summary (Last 24 hours) at 06/17/2021 0735 Last data filed at 06/17/2021 0603 Gross per 24 hour  Intake 120 ml  Output --  Net 120 ml   Weight  change:  Exam:  General:  Pt is alert, follows commands appropriately, not in acute distress HEENT: No icterus, No thrush, No neck mass, /AT Cardiovascular: RRR, S1/S2, no rubs, no gallops Respiratory: CTA bilaterally, no wheezing, no crackles, no rhonchi Abdomen: Soft/+BS, non tender, non distended, no guarding Extremities: No edema, No lymphangitis, No petechiae, No rashes, no synovitis Neuro:  Right facial droop, strength 4/5 in RUE, RLE, strength 4/5 LUE, LLE; sensation intact bilateral; no dysmetria; babinski  equivocal    Data Reviewed: I have personally reviewed following labs and imaging studies Basic Metabolic Panel: Recent Labs  Lab 06/16/21 1915  NA 138  K 3.8  CL 106  CO2 27  GLUCOSE 197*  BUN 12  CREATININE 0.73  CALCIUM 8.5*   Liver Function Tests: Recent Labs  Lab 06/16/21 1915  AST 21  ALT 15  ALKPHOS 71  BILITOT 0.3  PROT 8.1  ALBUMIN 4.1   No results for input(s): LIPASE, AMYLASE in the last 168 hours. No results for input(s): AMMONIA in the last 168 hours. Coagulation Profile: Recent Labs  Lab 06/16/21 1915  INR 1.1   CBC: Recent Labs  Lab 06/16/21 1915  WBC 4.7  NEUTROABS 2.3  HGB 12.7  HCT 39.9  MCV 99.3  PLT 198   Cardiac Enzymes: No results for input(s): CKTOTAL, CKMB, CKMBINDEX, TROPONINI in the last 168 hours. BNP: Invalid input(s): POCBNP CBG: Recent Labs  Lab 06/16/21 1923  GLUCAP 192*   HbA1C: No results for input(s): HGBA1C in the last 72 hours. Urine analysis:    Component Value Date/Time   COLORURINE YELLOW 06/16/2021 2142   APPEARANCEUR CLEAR 06/16/2021 2142   LABSPEC 1.020 06/16/2021 2142   PHURINE 7.0 06/16/2021 2142   GLUCOSEU 100 (A) 06/16/2021 2142   HGBUR NEGATIVE 06/16/2021 2142   BILIRUBINUR NEGATIVE 06/16/2021 2142   KETONESUR NEGATIVE 06/16/2021 2142   PROTEINUR NEGATIVE 06/16/2021 2142   UROBILINOGEN 1.0 10/22/2011 0926   NITRITE NEGATIVE 06/16/2021 2142   LEUKOCYTESUR NEGATIVE 06/16/2021 2142   Sepsis Labs: @LABRCNTIP (procalcitonin:4,lacticidven:4) )No results found for this or any previous visit (from the past 240 hour(s)).   Scheduled Meds:  aspirin EC  81 mg Oral Daily   clopidogrel  75 mg Oral Daily   ezetimibe  10 mg Oral Daily   levothyroxine  150 mcg Oral Q0600   rosuvastatin  40 mg Oral Daily   Continuous Infusions:  Procedures/Studies: CT HEAD WO CONTRAST  Result Date: 06/16/2021 CLINICAL DATA:  Weakness difficulty speaking EXAM: CT HEAD WITHOUT CONTRAST TECHNIQUE: Contiguous axial  images were obtained from the base of the skull through the vertex without intravenous contrast. COMPARISON:  CT brain 01/21/2017 FINDINGS: Brain: No hemorrhage or intracranial mass. Small age indeterminate cortical and subcortical infarct at the right frontal lobe, new since 2018. Age indeterminate lacunar infarct in the right caudate. Mild atrophy and chronic small vessel ischemic changes of the white matter. Nonenlarged ventricles Vascular: No hyperdense vessels. Vertebral and carotid vascular calcification Skull: Normal. Negative for fracture or focal lesion. Sinuses/Orbits: Fluid in the left maxillary sinus Other: None IMPRESSION: 1. Small age indeterminate infarcts within the right frontal lobe and right caudate. 2. Atrophy and chronic small vessel ischemic changes of the white matter Electronically Signed   By: 2019 M.D.   On: 06/16/2021 19:17    08/16/2021, DO  [facility]  If 7PM-7AM, please contact night-coverage [email or web address] Password [facility] 06/17/2021, 7:35 AM   LOS: 0 days

## 2021-06-17 NOTE — Evaluation (Signed)
Physical Therapy Evaluation Patient Details Name: [name] [name] MRN: [id number] DOB: [birth year] Today's Date: 06/17/2021  History of Present Illness  [name] [name] is a 87 y.o. female with medical history significant for hypertension, hyperlipidemia, hypothyroidism, CAD s/p DES to LAD (2008) who presents to the emergency department due to slurred speech noticed by family around 9 PM last night.  Patient's family noted patient presenting with slurred speech with difficulty to understand around 9 PM to 10 PM yesterday (8/10), she also had difficulty to pick things up with left hand and gait was unsteady.  She went to bed and woke up this morning still with difficulty with speech around 6:30 AM, she complained of generalized weakness, but speech appeared to improve throughout the day.  Son thought that he saw a left-sided facial droop and she was taken to the ED for further evaluation and management.  Patient denies chest pain, shortness of breath, nausea, vomiting or any facial pain/weakness   Clinical Impression  Patient functioning near baseline for functional mobility and gait demonstrating slow slightly labored cadence in room/hallway without loss of balance, limited mostly due to fatigue and put back to bed after therapy.  Patient will benefit from continued physical therapy in hospital and recommended venue below to increase strength, balance, endurance for safe ADLs and gait.        Recommendations for follow up therapy are one component of a multi-disciplinary discharge planning process, led by the attending physician.  Recommendations may be updated based on patient status, additional functional criteria and insurance authorization.  Follow Up Recommendations Home health PT;Supervision - Intermittent;Supervision for mobility/OOB    Equipment Recommendations  None recommended by PT    Recommendations for Other Services       Precautions / Restrictions Precautions Precautions:  Fall Restrictions Weight Bearing Restrictions: No      Mobility  Bed Mobility Overal bed mobility: Modified Independent                  Transfers Overall transfer level: Needs assistance Equipment used: None Transfers: Sit to/from Stand;Stand Pivot Transfers Sit to Stand: Supervision Stand pivot transfers: Supervision       General transfer comment: slow labored movement  Ambulation/Gait Ambulation/Gait assistance: Supervision Gait Distance (Feet): 70 Feet Assistive device: None;1 person hand held assist Gait Pattern/deviations: Decreased step length - left;Decreased stance time - right;Decreased stride length Gait velocity: decreased   General Gait Details: slightly labored slow cadence with decreased step/stride length without loss of balance, limited mostly due to c/o fatigue  Stairs            Wheelchair Mobility    Modified Rankin (Stroke Patients Only)       Balance Overall balance assessment: Mild deficits observed, not formally tested                                           Pertinent Vitals/Pain Pain Assessment: No/denies pain    Home Living Family/patient expects to be discharged to:: Private residence Living Arrangements: Children Available Help at Discharge: Family;Available 24 hours/day Type of Home: House Home Access: Stairs to enter Entrance Stairs-Rails: Right;Left;Can reach both Entrance Stairs-Number of Steps: 4 Home Layout: Laundry or work area in basement;Two level Home Equipment: Walker - 2 wheels;Cane - single point;Shower seat;Bedside commode      Prior Function Level of Independence: Needs assistance   Gait /  Transfers Assistance Needed: household ambulator without AD, uses SPC for longer distances  ADL's / Homemaking Assistance Needed: assisted by family        Hand Dominance   Dominant Hand: Right    Extremity/Trunk Assessment   Upper Extremity Assessment Upper Extremity Assessment:  Defer to OT evaluation    Lower Extremity Assessment Lower Extremity Assessment: Overall WFL for tasks assessed    Cervical / Trunk Assessment Cervical / Trunk Assessment: Normal  Communication   Communication: No difficulties  Cognition Arousal/Alertness: Awake/alert Behavior During Therapy: WFL for tasks assessed/performed Overall Cognitive Status: Within Functional Limits for tasks assessed                                        General Comments      Exercises     Assessment/Plan    PT Assessment Patient needs continued PT services  PT Problem List Decreased strength;Decreased activity tolerance;Decreased balance;Decreased mobility       PT Treatment Interventions DME instruction;Gait training;Stair training;Functional mobility training;Therapeutic activities;Therapeutic exercise;Patient/family education;Balance training    PT Goals (Current goals can be found in the Care Plan section)  Acute Rehab PT Goals Patient Stated Goal: return home with family to assist PT Goal Formulation: With patient Time For Goal Achievement: 06/19/21 Potential to Achieve Goals: Good    Frequency Min 3X/week   Barriers to discharge        Co-evaluation               AM-PAC PT "6 Clicks" Mobility  Outcome Measure Help needed turning from your back to your side while in a flat bed without using bedrails?: None Help needed moving from lying on your back to sitting on the side of a flat bed without using bedrails?: None Help needed moving to and from a bed to a chair (including a wheelchair)?: A Little Help needed standing up from a chair using your arms (e.g., wheelchair or bedside chair)?: A Little Help needed to walk in hospital room?: A Little Help needed climbing 3-5 steps with a railing? : A Little 6 Click Score: 20    End of Session   Activity Tolerance: Patient tolerated treatment well;Patient limited by fatigue Patient left: in bed;with call  bell/phone within reach;with family/visitor present Nurse Communication: Mobility status PT Visit Diagnosis: Unsteadiness on feet (R26.81);Other abnormalities of gait and mobility (R26.89);Muscle weakness (generalized) (M62.81)    Time: 2951-8841 PT Time Calculation (min) (ACUTE ONLY): 20 min   Charges:   PT Evaluation $PT Eval Moderate Complexity: 1 Mod PT Treatments $Therapeutic Activity: 8-22 mins        10:30 AM, 06/17/21 Ocie Bob, MPT Physical Therapist with [facility] 336 [phone] office [phone] mobile phone

## 2021-06-17 NOTE — Plan of Care (Signed)
  Problem: Acute Rehab PT Goals(only PT should resolve) Goal: Pt Will Go Supine/Side To Sit Outcome: Progressing Flowsheets (Taken 06/17/2021 1032) Pt will go Supine/Side to Sit:  Independently  with modified independence Goal: Patient Will Transfer Sit To/From Stand Outcome: Progressing Flowsheets (Taken 06/17/2021 1032) Patient will transfer sit to/from stand:  with modified independence  with supervision Goal: Pt Will Transfer Bed To Chair/Chair To Bed Outcome: Progressing Flowsheets (Taken 06/17/2021 1032) Pt will Transfer Bed to Chair/Chair to Bed:  with modified independence  with supervision Goal: Pt Will Ambulate Outcome: Progressing Flowsheets (Taken 06/17/2021 1032) Pt will Ambulate:  100 feet  with least restrictive assistive device  with modified independence   10:32 AM, 06/17/21 Ocie Bob, MPT Physical Therapist with [facility] [phone] office [phone] mobile phone

## 2021-06-17 NOTE — Progress Notes (Signed)
OT Cancellation Note  Patient Details Name: [name] [name] MRN: [id number] DOB: 05/17/1934   Cancelled Treatment:    Reason Eval/Treat Not Completed: Patient at procedure or test/ unavailable. Patient receiving Echo when OT attempted evaluation. Unable to complete at this time and will re-attempt when able.   PT evaluation is completed and HHPT was recommended. Patient appears to be close to baseline. She does have family support at home when discharged.   Limmie Patricia, OTR/L,CBIS  [phone]  06/17/2021, 10:46 AM

## 2021-06-17 NOTE — Progress Notes (Addendum)
SLP Cancellation Note  Patient Details Name: [name] [name] MRN: [id number] DOB: 10/19/1933   Cancelled treatment:       Reason Eval/Treat Not Completed: Other (comment) (Pt passed [facility] swallow screen and was started on a diet. Will ask RN if Pt still needs BSE.) Pt screened in room and denies difficulty swallowing. BSE will be deferred. She also indicates that her speech has returned to baseline. No SLE ordered, but can complete if MD would like.  Thank you,  Havery Moros, CCC-SLP [phone]    Vishnu Moeller 06/17/2021, 11:54 AM

## 2021-06-17 NOTE — Consult Note (Signed)
[facility] Alphonzo Devera A. Gerilyn Pilgrim, MD     [email or web address]          [name] [name] is an 87 y.o. female.   ASSESSMENT/PLAN: Acute infarct is noted involving the right frontal area. This is a moderate-sized cortical infarct. Appears consistent with cryptogenic event. Consequently, 30 day event monitor is recommended. Dual antiplatelet agents with aspirin 325 and Plavix 75 mg are recommended for 4 weeks. Afterwards single agent since device. Multiple remote infarcts       The patient presents with the acute onset of dysarthria suspected left-sided weakness. Weakness is suspected to be involving the left facial region but also the left extremities. Her symptoms have improved since she has been hospitalized. The patient reportedly been on aspirin 81 mg and Plavix for a long time. She tells me she has been compliant with this. She does not report significant dysphagia, diplopia headaches or loss of consciousness. There are no reports of palpitation chest pain or shortness of breath. The review systems otherwise unrevealing.  GENERAL:  She is doing well at this time  HEENT:  Normal  ABDOMEN: soft  EXTREMITIES: No edema   BACK: normal  SKIN: Normal by inspection.    MENTAL STATUS: Alert and oriented. Speech -  slightly dysarthric, language and cognition are generally intact. Judgment and insight normal.   CRANIAL NERVES: Pupils are equal, round and reactive to light and accomodation; extra ocular movements are full, there is no significant nystagmus; visual fields are full; upper and lower facial muscles are normal in strength and symmetric, there is no flattening of the nasolabial folds; tongue is midline; uvula is midline; shoulder elevation is normal.  MOTOR: Normal tone, bulk and strength; no pronator drift.  COORDINATION: Left finger to nose is normal, right finger to nose is normal, No rest tremor; no intention tremor; no postural tremor; no bradykinesia.  REFLEXES:  Deep tendon reflexes are symmetrical and normal.   SENSATION: Normal to light touch, temperature, and pain.  There is no extension on double simultaneous stimulation.      Blood pressure (!) 146/55, pulse (!) 56, temperature 98.4 F (36.9 C), temperature source Oral, resp. rate 17, height 5' (1.524 m), weight 65.8 kg, SpO2 99 %.  Past Medical History:  Diagnosis Date   Ankle edema    Arthritis    right hip   Bronchitis    in Dec   CAD (coronary artery disease)    a. s/p DES to LAD in 2008, NST in 2015 and 2017 which were low-risk   Carotid stenosis    mild   Cataract    left;not ready to come off   Constipation    takes OTC stool softener as needed   Diabetes mellitus    pt doesn't take any meds for it   DJD (degenerative joint disease) of hip    right   Dry cough    GERD (gastroesophageal reflux disease)    Headache(784.0)    occasionally   HTN (hypertension)    takes Amlodpine and Carvedilol daily   Hx of cardiovascular stress test    Lexiscan Myoview (04/2014):  No ischemia, EF 64%, Low Risk   Hypercholesteremia    takes Crestor daily   Hypothyroidism    takes Synthroid daily   Ileus, postoperative (HCC) 05/12/2014   Myocardial infarction [facility])    pt unsure but thinks about 6yrs ago;is taking Plavix daily    Nocturia    Osteoarthritis of left hip 05/09/2014   Primary osteoarthritis  of right hip 10/27/2011    Past Surgical History:  Procedure Laterality Date   ABDOMINAL HYSTERECTOMY  20+yrs   BACK SURGERY  15+yrs ago   CARDIAC CATHETERIZATION  2008   CORONARY ARTERY BYPASS GRAFT     LYMPH NODE DISSECTION  20+yrs ago   right    TOTAL HIP ARTHROPLASTY  10/27/2011   Procedure: TOTAL HIP ARTHROPLASTY;  Surgeon: Eulas Post, MD;  Location: [facility] OR;  Service: Orthopedics;  Laterality: Right;   TOTAL HIP ARTHROPLASTY Left 05/09/2014   Procedure: LEFT TOTAL HIP ARTHROPLASTY;  Surgeon: Eulas Post, MD;  Location: [facility] OR;  Service: Orthopedics;  Laterality: Left;     Family History  Problem Relation Age of Onset   Hypertension Mother    Stroke Mother    Hypertension Father    Heart attack Brother    Heart attack Sister    Hypertension Sister    Hypertension Brother    Hypertension Daughter    Hypertension Son    Hyperlipidemia Sister    Diabetes Brother    Anesthesia problems Neg Hx    Hypotension Neg Hx    Malignant hyperthermia Neg Hx    Pseudochol deficiency Neg Hx     Social History:  reports that she has never smoked. She has never used smokeless tobacco. She reports that she does not drink alcohol and does not use drugs.  Allergies: No Known Allergies  Medications: Prior to Admission medications   Medication Sig Start Date End Date Taking? Authorizing Provider  amLODipine (NORVASC) 10 MG tablet Take 1 tablet (10 mg total) by mouth daily. 09/28/19  Yes Netta Neat., NP  aspirin 81 MG tablet Take 81 mg by mouth daily.   Yes [provider]  clopidogrel (PLAVIX) 75 MG tablet TAKE 1 TABLET BY MOUTH ONCE DAILY. Patient taking differently: Take 75 mg by mouth daily. 10/10/19  Yes Laqueta Linden, MD  ezetimibe (ZETIA) 10 MG tablet TAKE 1 TABLET BY MOUTH ONCE DAILY. Patient taking differently: Take 10 mg by mouth daily. 11/30/19  Yes Laqueta Linden, MD  fluticasone (FLONASE) 50 MCG/ACT nasal spray Place 1 spray into both nostrils daily. Patient taking differently: Place 1 spray into both nostrils daily as needed for allergies or rhinitis. 04/28/20  Yes Graciella Freer A, PA-C  isosorbide mononitrate (IMDUR) 30 MG 24 hr tablet TAKE 1 TABLET BY MOUTH ONCE DAILY. Patient taking differently: Take 30 mg by mouth daily. 03/08/20  Yes Laqueta Linden, MD  levothyroxine (SYNTHROID) 150 MCG tablet Take 150 mcg by mouth daily.   Yes [provider]  nitroGLYCERIN (NITROSTAT) 0.4 MG SL tablet Place 1 tablet (0.4 mg total) under the tongue every 5 (five) minutes as needed for chest pain. 10/25/20 06/16/21 Yes Branch,  Dorothe Pea, MD  rosuvastatin (CRESTOR) 40 MG tablet TAKE 1 TABLET BY MOUTH ONCE DAILY. Patient taking differently: Take 40 mg by mouth daily. 11/30/19  Yes Laqueta Linden, MD  spironolactone (ALDACTONE) 25 MG tablet TAKE 1 TABLET BY MOUTH ONCE DAILY. Patient taking differently: Take 25 mg by mouth daily. 10/10/19  Yes Laqueta Linden, MD  enalapril (VASOTEC) 20 MG tablet TAKE 1 TABLET BY MOUTH ONCE DAILY. 03/07/20   Laqueta Linden, MD    Scheduled Meds:  aspirin EC  81 mg Oral Daily   clopidogrel  75 mg Oral Daily   ezetimibe  10 mg Oral Daily   levothyroxine  150 mcg Oral Q0600   rosuvastatin  40 mg  Oral Daily   Continuous Infusions: PRN Meds:.acetaminophen, ondansetron (ZOFRAN) IV     Results for orders placed or performed during the hospital encounter of 06/16/21 (from the past 48 hour(s))  Ethanol     Status: None   Collection Time: 06/16/21  7:15 PM  Result Value Ref Range   Alcohol, Ethyl (B) <10 <10 mg/dL    Comment: (NOTE) Lowest detectable limit for serum alcohol is 10 mg/dL.  For medical purposes only. Performed at [facility], [address] [place], [place] [address]   Protime-INR     Status: None   Collection Time: 06/16/21  7:15 PM  Result Value Ref Range   Prothrombin Time 13.7 11.4 - 15.2 seconds   INR 1.1 0.8 - 1.2    Comment: (NOTE) INR goal varies based on device and disease states. Performed at [facility], [address], [place], [place] [address]   APTT     Status: None   Collection Time: 06/16/21  7:15 PM  Result Value Ref Range   aPTT 30 24 - 36 seconds    Comment: Performed at [facility], [address], [place], [place] [address]  CBC     Status: None   Collection Time: 06/16/21  7:15 PM  Result Value Ref Range   WBC 4.7 4.0 - 10.5 K/uL   RBC 4.02 3.87 - 5.11 MIL/uL   Hemoglobin 12.7 12.0 - 15.0 g/dL   HCT 16.0 10.9 - 32.3 %   MCV 99.3 80.0 - 100.0 fL   MCH 31.6 26.0 - 34.0 pg   MCHC 31.8 30.0 - 36.0 g/dL   RDW 55.7 32.2  - 02.5 %   Platelets 198 150 - 400 K/uL   nRBC 0.0 0.0 - 0.2 %    Comment: Performed at [facility], [address] [place], [place] [address]  Differential     Status: None   Collection Time: 06/16/21  7:15 PM  Result Value Ref Range   Neutrophils Relative % 49 %   Neutro Abs 2.3 1.7 - 7.7 K/uL   Lymphocytes Relative 39 %   Lymphs Abs 1.8 0.7 - 4.0 K/uL   Monocytes Relative 10 %   Monocytes Absolute 0.5 0.1 - 1.0 K/uL   Eosinophils Relative 2 %   Eosinophils Absolute 0.1 0.0 - 0.5 K/uL   Basophils Relative 0 %   Basophils Absolute 0.0 0.0 - 0.1 K/uL   Immature Granulocytes 0 %   Abs Immature Granulocytes 0.01 0.00 - 0.07 K/uL    Comment: Performed at [facility], [address], [place], [place] [address]  Comprehensive metabolic panel     Status: Abnormal   Collection Time: 06/16/21  7:15 PM  Result Value Ref Range   Sodium 138 135 - 145 mmol/L   Potassium 3.8 3.5 - 5.1 mmol/L   Chloride 106 98 - 111 mmol/L   CO2 27 22 - 32 mmol/L   Glucose, Bld 197 (H) 70 - 99 mg/dL    Comment: Glucose reference range applies only to samples taken after fasting for at least 8 hours.   BUN 12 8 - 23 mg/dL   Creatinine, Ser 8.31 0.44 - 1.00 mg/dL   Calcium 8.5 (L) 8.9 - 10.3 mg/dL   Total Protein 8.1 6.5 - 8.1 g/dL   Albumin 4.1 3.5 - 5.0 g/dL   AST 21 15 - 41 U/L   ALT 15 0 - 44 U/L   Alkaline Phosphatase 71 38 - 126 U/L   Total Bilirubin  0.3 0.3 - 1.2 mg/dL   GFR, Estimated >16 >10 mL/min    Comment: (NOTE) Calculated using the CKD-EPI Creatinine Equation (2021)    Anion gap 5 5 - 15    Comment: Performed at [facility], [address] [place], [place] [address]  CBG monitoring, ED     Status: Abnormal   Collection Time: 06/16/21  7:23 PM  Result Value Ref Range   Glucose-Capillary 192 (H) 70 - 99 mg/dL    Comment: Glucose reference range applies only to samples taken after fasting for at least 8 hours.  SARS CORONAVIRUS 2 (TAT 6-24 HRS) Nasopharyngeal Nasopharyngeal Swab      Status: None   Collection Time: 06/16/21  9:20 PM   Specimen: Nasopharyngeal Swab  Result Value Ref Range   SARS Coronavirus 2 NEGATIVE NEGATIVE    Comment: (NOTE) SARS-CoV-2 target nucleic acids are NOT DETECTED.  The SARS-CoV-2 RNA is generally detectable in upper and lower respiratory specimens during the acute phase of infection. Negative results do not preclude SARS-CoV-2 infection, do not rule out co-infections with other pathogens, and should not be used as the sole basis for treatment or other patient management decisions. Negative results must be combined with clinical observations, patient history, and epidemiological information. The expected result is Negative.  Fact Sheet for Patients: HairSlick.no  Fact Sheet for Healthcare Providers: quierodirigir.com  This test is not yet approved or cleared by the [place] FDA and  has been authorized for detection and/or diagnosis of SARS-CoV-2 by FDA under an Emergency Use Authorization (EUA). This EUA will remain  in effect (meaning this test can be used) for the duration of the COVID-19 declaration under Se ction 564(b)(1) of the Act, 21 U.S.C. section 360bbb-3(b)(1), unless the authorization is terminated or revoked sooner.  Performed at [facility], [address] [place], [place] [address]   Urine rapid drug screen (hosp performed)     Status: None   Collection Time: 06/16/21  9:42 PM  Result Value Ref Range   Opiates NONE DETECTED NONE DETECTED   Cocaine NONE DETECTED NONE DETECTED   Benzodiazepines NONE DETECTED NONE DETECTED   Amphetamines NONE DETECTED NONE DETECTED   Tetrahydrocannabinol NONE DETECTED NONE DETECTED   Barbiturates NONE DETECTED NONE DETECTED    Comment: (NOTE) DRUG SCREEN FOR MEDICAL PURPOSES ONLY.  IF CONFIRMATION IS NEEDED FOR ANY PURPOSE, NOTIFY LAB WITHIN 5 DAYS.  LOWEST DETECTABLE LIMITS FOR URINE DRUG SCREEN Drug  Class                     Cutoff (ng/mL) Amphetamine and metabolites    1000 Barbiturate and metabolites    200 Benzodiazepine                 200 Tricyclics and metabolites     300 Opiates and metabolites        300 Cocaine and metabolites        300 THC                            50 Performed at [facility], [address] [place], [place] [address]   Urinalysis, Routine w reflex microscopic     Status: Abnormal   Collection Time: 06/16/21  9:42 PM  Result Value Ref Range   Color, Urine YELLOW YELLOW   APPearance CLEAR CLEAR   Specific Gravity, Urine 1.020 1.005 - 1.030   pH 7.0 5.0 -  8.0   Glucose, UA 100 (A) NEGATIVE mg/dL   Hgb urine dipstick NEGATIVE NEGATIVE   Bilirubin Urine NEGATIVE NEGATIVE   Ketones, ur NEGATIVE NEGATIVE mg/dL   Protein, ur NEGATIVE NEGATIVE mg/dL   Nitrite NEGATIVE NEGATIVE   Leukocytes,Ua NEGATIVE NEGATIVE    Comment: Microscopic not done on urines with negative protein, blood, leukocytes, nitrite, or glucose < 500 mg/dL. Performed at [facility], [address] [place], [place] [address]   Hemoglobin A1c     Status: Abnormal   Collection Time: 06/17/21  6:11 AM  Result Value Ref Range   Hgb A1c MFr Bld 6.5 (H) 4.8 - 5.6 %    Comment: (NOTE) Pre diabetes:          5.7%-6.4%  Diabetes:              >6.4%  Glycemic control for   <7.0% adults with diabetes    Mean Plasma Glucose 139.85 mg/dL    Comment: Performed at [facility], [address], [place], [place] [address]  Lipid panel     Status: Abnormal   Collection Time: 06/17/21  6:11 AM  Result Value Ref Range   Cholesterol 186 0 - 200 mg/dL   Triglycerides 91 <098 mg/dL   HDL 53 >11 mg/dL   Total CHOL/HDL Ratio 3.5 RATIO   VLDL 18 0 - 40 mg/dL   LDL Cholesterol 914 (H) 0 - 99 mg/dL    Comment:        Total Cholesterol/HDL:CHD Risk Coronary Heart Disease Risk Table                     Men   Women  1/2 Average Risk   3.4   3.3  Average Risk       5.0   4.4  2 X Average  Risk   9.6   7.1  3 X Average Risk  23.4   11.0        Use the calculated Patient Ratio above and the CHD Risk Table to determine the patient's CHD Risk.        ATP III CLASSIFICATION (LDL):  <100     mg/dL   Optimal  782-956  mg/dL   Near or Above                    Optimal  130-159  mg/dL   Borderline  213-086  mg/dL   High  >578     mg/dL   Very High Performed at [facility], [address], [place], [place] [address]   Comprehensive metabolic panel     Status: Abnormal   Collection Time: 06/17/21  6:11 AM  Result Value Ref Range   Sodium 142 135 - 145 mmol/L   Potassium 4.0 3.5 - 5.1 mmol/L   Chloride 104 98 - 111 mmol/L   CO2 29 22 - 32 mmol/L   Glucose, Bld 119 (H) 70 - 99 mg/dL    Comment: Glucose reference range applies only to samples taken after fasting for at least 8 hours.   BUN 10 8 - 23 mg/dL   Creatinine, Ser 9.52 0.44 - 1.00 mg/dL   Calcium 9.1 8.9 - 84.1 mg/dL   Total Protein 7.6 6.5 - 8.1 g/dL   Albumin 3.8 3.5 - 5.0 g/dL   AST 18 15 - 41 U/L   ALT 14 0 - 44 U/L   Alkaline Phosphatase 65 38 - 126 U/L   Total  Bilirubin 0.7 0.3 - 1.2 mg/dL   GFR, Estimated >16>60 >10>60 mL/min    Comment: (NOTE) Calculated using the CKD-EPI Creatinine Equation (2021)    Anion gap 9 5 - 15    Comment: Performed at [facility], [address] [place], [place] [address]  CBC     Status: None   Collection Time: 06/17/21  6:11 AM  Result Value Ref Range   WBC 4.7 4.0 - 10.5 K/uL   RBC 4.02 3.87 - 5.11 MIL/uL   Hemoglobin 12.8 12.0 - 15.0 g/dL   HCT 40.940.0 81.136.0 - 91.446.0 %   MCV 99.5 80.0 - 100.0 fL   MCH 31.8 26.0 - 34.0 pg   MCHC 32.0 30.0 - 36.0 g/dL   RDW 78.212.4 95.611.5 - 21.315.5 %   Platelets 206 150 - 400 K/uL   nRBC 0.0 0.0 - 0.2 %    Comment: Performed at [facility], [address] [place], [place] [address]  Protime-INR     Status: None   Collection Time: 06/17/21  6:11 AM  Result Value Ref Range   Prothrombin Time 13.8 11.4 - 15.2 seconds   INR 1.1 0.8 - 1.2    Comment:  (NOTE) INR goal varies based on device and disease states. Performed at [facility], [address] [place], [place] [address]   APTT     Status: None   Collection Time: 06/17/21  6:11 AM  Result Value Ref Range   aPTT 31 24 - 36 seconds    Comment: Performed at [facility], [address] [place], [place] [address]  Magnesium     Status: None   Collection Time: 06/17/21  6:11 AM  Result Value Ref Range   Magnesium 2.1 1.7 - 2.4 mg/dL    Comment: Performed at [facility], [address] [place], [place] [address]  Phosphorus     Status: None   Collection Time: 06/17/21  6:11 AM  Result Value Ref Range   Phosphorus 3.5 2.5 - 4.6 mg/dL    Comment: Performed at [facility], [address] [place], [place] [address]    Studies/Results:  HEAD CT IMPRESSION: 1. Small age indeterminate infarcts within the right frontal lobe and right caudate. 2. Atrophy and chronic small vessel ischemic changes of the white matter      CAROTID DUPLEX DOPPLER IMPRESSION: Color duplex indicates minimal heterogeneous and calcified plaque, with no hemodynamically significant stenosis by duplex criteria in the extracranial cerebrovascular circulation.      BRAIN MRI MRA FINDINGS: MRI HEAD   Brain: Small area cortical/subcortical reduced diffusion in the right frontal lobe including some involvement of the precentral gyrus. No evidence of hemorrhage.   There is no intracranial mass or mass effect. There is no hydrocephalus or extra-axial fluid collection.   Prominence of the ventricles and sulci reflects parenchymal volume loss patchy and confluent areas of T2 hyperintensity in the supratentorial white matter nonspecific but may reflect moderate chronic microvascular ischemic changes. There is a small chronic frontal cortical/subcortical infarct. Several chronic small-vessel infarcts are present including involvement of the corona radiata, right genu of the corpus callosum, and right  caudate, left pons, and left cerebellum   Vascular: Major vessel flow voids at the skull base are preserved.   Skull and upper cervical spine: Normal marrow signal is preserved.   Sinuses/Orbits: Paranasal sinuses are aerated. Orbits are unremarkable.   Other: Sella is partially empty.  Mastoid air cells are clear.   MRA HEAD   Degraded  by artifact. Intracranial internal carotid arteries are patent with atherosclerotic irregularity. Middle and anterior cerebral arteries are patent. Intracranial vertebral arteries, basilar artery, posterior cerebral arteries are patent. Areas of irregularity along the MCA, ACA, and PCA branches likely exaggerated by artifact but may represent atherosclerosis.   IMPRESSION: Small acute infarction of the right frontal lobe with some involvement of the precentral gyrus. No hemorrhage.   Moderate chronic microvascular ischemic changes. Several small infarcts as described.   No proximal intracranial vessel occlusion. Probable atherosclerotic irregularity of more distal MCA, ACA, and PCA branches with suboptimal evaluation due to artifact.      TTE 1. Left ventricular ejection fraction, by estimation, is 70 to 75%. The  left ventricle has hyperdynamic function. The left ventricle has no  regional wall motion abnormalities. Left ventricular diastolic parameters  are consistent with Grade I diastolic  dysfunction (impaired relaxation).   2. Right ventricular systolic function is normal. The right ventricular  size is normal. There is normal pulmonary artery systolic pressure. The  estimated right ventricular systolic pressure is 21.8 mmHg.   3. Left atrial size was mildly dilated.   4. The mitral valve is degenerative. Mild mitral valve regurgitation.   5. The aortic valve is tricuspid. Aortic valve regurgitation is not  visualized. No aortic stenosis is present. Aortic valve mean gradient  measures 5.0 mmHg.   6. The inferior vena cava is  normal in size with greater than 50%  respiratory variability, suggesting right atrial pressure of 3 mmHg.        Brain MRI and MRA reviewed in person. Acute infarct is noted involving the right frontal area. This is a moderate-sized cortical infarct. Remote infarcts are noted in multiple locations including right watershed distribution anteriorly, left pontine, right  genu of the corpus callosum and  Bilateral centrum semiovale. There is luminal irregularities noted of the MCAs bilaterally especially on the right side but no clear focal stenosis. No hemorrhages noted.     Neisha Hinger A. Gerilyn Pilgrim, M.D.  Diplomate, Biomedical engineer of Psychiatry and Neurology ( Neurology). 06/17/2021, 4:56 PM

## 2021-06-18 ENCOUNTER — Telehealth: Payer: Self-pay | Admitting: *Deleted

## 2021-06-18 DIAGNOSIS — I639 Cerebral infarction, unspecified: Secondary | ICD-10-CM

## 2021-06-18 DIAGNOSIS — I2510 Atherosclerotic heart disease of native coronary artery without angina pectoris: Secondary | ICD-10-CM

## 2021-06-18 LAB — BASIC METABOLIC PANEL
Anion gap: 7 (ref 5–15)
BUN: 10 mg/dL (ref 8–23)
CO2: 27 mmol/L (ref 22–32)
Calcium: 8.6 mg/dL — ABNORMAL LOW (ref 8.9–10.3)
Chloride: 106 mmol/L (ref 98–111)
Creatinine, Ser: 0.72 mg/dL (ref 0.44–1.00)
GFR, Estimated: >60 mL/min (ref >60)
Glucose, Bld: 126 mg/dL — ABNORMAL HIGH (ref 70–99)
Potassium: 3.7 mmol/L (ref 3.5–5.1)
Sodium: 140 mmol/L (ref 135–145)

## 2021-06-18 LAB — MAGNESIUM: Magnesium: 2.1 mg/dL (ref 1.7–2.4)

## 2021-06-18 MED ORDER — ASPIRIN 325 MG PO TABS: 325.0000 mg | ORAL_TABLET | Freq: Every day | ORAL | Status: DC

## 2021-06-18 MED ORDER — ASPIRIN EC 81 MG PO TBEC
81.0000 mg | DELAYED_RELEASE_TABLET | Freq: Once | ORAL | Status: AC
  Administered 2021-06-18: 81 mg via ORAL
  Filled 2021-06-18: qty 1

## 2021-06-18 NOTE — Telephone Encounter (Signed)
Pt enrolled in [employer] for 30 day event monitor.

## 2021-06-18 NOTE — Progress Notes (Signed)
Physical Therapy Treatment Patient Details Name: LEXINE JASPERS MRN: [id number] DOB: 1934-05-03 Today's Date: 06/18/2021   History of Present Illness Tangie IOANNA COLQUHOUN is a 87 y.o. female with medical history significant for hypertension, hyperlipidemia, hypothyroidism, CAD s/p DES to LAD (2008) who presents to the emergency department due to slurred speech noticed by family around 9 PM last night.  Patient's family noted patient presenting with slurred speech with difficulty to understand around 9 PM to 10 PM yesterday (8/10), she also had difficulty to pick things up with left hand and gait was unsteady.  She went to bed and woke up this morning still with difficulty with speech around 6:30 AM, she complained of generalized weakness, but speech appeared to improve throughout the day.  Son thought that he saw a left-sided facial droop and she was taken to the ED for further evaluation and management.  Patient denies chest pain, shortness of breath, nausea, vomiting or any facial pain/weakness    PT Comments    Pt friendly and willing to participate with therapy.  Able to increased distance with gait training though was limited by fatigue.  Pt gait mechanics limited by decreased stride length, decrease trunk rotation and arm swing during gait that did improve with cueing, no LOB episodes noted during gait.  Did add balance activities this session with intemittent HHA required during tandem stance and sidestepping. EOS pt left in chair with son in room, call bell within reach and RN aware of status.      Recommendations for follow up therapy are one component of a multi-disciplinary discharge planning process, led by the attending physician.  Recommendations may be updated based on patient status, additional functional criteria and insurance authorization.  Follow Up Recommendations  Home health PT;Supervision - Intermittent;Supervision for mobility/OOB     Equipment Recommendations  None recommended  by PT    Recommendations for Other Services       Precautions / Restrictions Precautions Precautions: Fall Restrictions Weight Bearing Restrictions: No     Mobility  Bed Mobility Overal bed mobility: Independent                  Transfers Overall transfer level: Modified independent Equipment used: None Transfers: Sit to/from Stand Sit to Stand: Supervision         General transfer comment: slow labored movement  Ambulation/Gait Ambulation/Gait assistance: Supervision Gait Distance (Feet): 100 Feet Assistive device: None Gait Pattern/deviations: Decreased stride length Gait velocity: decreased   General Gait Details: slow labored gait with decreased stride length and arm swing.  No LOB noted, was limited mainly by fatigue.   Stairs             Wheelchair Mobility    Modified Rankin (Stroke Patients Only)       Balance Overall balance assessment: Mild deficits observed, not formally tested                                          Cognition Arousal/Alertness: Awake/alert Behavior During Therapy: WFL for tasks assessed/performed Overall Cognitive Status: Within Functional Limits for tasks assessed                                        Exercises Other Exercises Other Exercises: Tandem stance 2x 20" with intermittent HHA  Other Exercises: sidestep 2RT x 6 ft    General Comments        Pertinent Vitals/Pain Pain Assessment: No/denies pain    Home Living Family/patient expects to be discharged to:: Private residence Living Arrangements: Children Available Help at Discharge: Family;Available 24 hours/day Type of Home: House Home Access: Stairs to enter Entrance Stairs-Rails: Right;Left;Can reach both Home Layout: Laundry or work area in basement;Two level Home Equipment: Walker - 2 wheels;Cane - single point;Shower seat;Bedside commode Additional Comments: taken via PT notes    Prior Function Level  of Independence: Needs assistance  Gait / Transfers Assistance Needed: household ambulator without AD, uses SPC for longer distances ADL's / Homemaking Assistance Needed: Pt reported that she did not need assist for ADL or IADL's     PT Goals (current goals can now be found in the care plan section)      Frequency    Min 3X/week      PT Plan      Co-evaluation              AM-PAC PT "6 Clicks" Mobility   Outcome Measure  Help needed turning from your back to your side while in a flat bed without using bedrails?: None Help needed moving from lying on your back to sitting on the side of a flat bed without using bedrails?: None Help needed moving to and from a bed to a chair (including a wheelchair)?: A Little Help needed standing up from a chair using your arms (e.g., wheelchair or bedside chair)?: A Little Help needed to walk in hospital room?: A Little Help needed climbing 3-5 steps with a railing? : A Little 6 Click Score: 20    End of Session Equipment Utilized During Treatment: Gait belt Activity Tolerance: Patient tolerated treatment well;Patient limited by fatigue Patient left: in chair;with family/visitor present;with call bell/phone within reach Nurse Communication: Mobility status PT Visit Diagnosis: Unsteadiness on feet (R26.81);Other abnormalities of gait and mobility (R26.89);Muscle weakness (generalized) (M62.81)     Time: 2595-6387 PT Time Calculation (min) (ACUTE ONLY): 16 min  Charges:  $Therapeutic Activity: 8-22 mins                     Becky Sax, LPTA/CLT; CBIS [phone]  Juel Burrow 06/18/2021, 11:12 AM

## 2021-06-18 NOTE — Discharge Summary (Signed)
Physician Discharge Summary  [name] [name] ZOX:[id number] DOB: [birth year] DOA: 06/16/2021  PCP: Oneal Grout, FNP  Admit date: 06/16/2021 Discharge date: 06/18/2021  Admitted From: Home Disposition:  Home   Recommendations for Outpatient Follow-up:  Follow up with PCP in 1-2 weeks Please obtain BMP/CBC in one week Please follow up on the following pending results:  Home Health:yes   Discharge Condition: Stable CODE STATUS: FULL Diet recommendation: Heart Healthy / Carb Modified    Brief/Interim Summary:  87 y.o. female with medical history significant for hypertension, hyperlipidemia, hypothyroidism, CAD s/p DES to LAD (2008) who presents to the emergency department due to slurred speech noticed by family around 9 PM on 06/15/21.  The patient states that she has been in her usual state of health until the evening of 9/22 when she began developing the above symptoms.  Apparently, the patient's family noted that she had difficulty picking up objects with her left hand and had an unsteady gait.  She went to bed and woke up in the morning of 06/16/2021 with continued slurred speech.  As of the day went along, her slurred speech has somewhat improved.  Apparently her son also noted that she may have had some facial droop.  As result, the patient was brought to the emergency department for further evaluation.  The patient herself denies any fevers, chills, chest pain, shortness breath, cough, hemoptysis, nausea, vomiting, diarrhea, abdpain, dysuria, hematuria.  She did have right parietal headache which is relieved.  She stated that she has been compliant with all her medications including aspirin and Plavix. In the emergency department, the patient was afebrile hemodynamically stable with oxygen saturation 100% room air.  CT of the brain showed age-indeterminate infarct in the right frontal and Right Caudate nucleus.  As result, the patient was admitted for further treatment and  evaluation.  Discharge Diagnoses:  Acute Ischemic Stroke -Concerned about stroke -Neurology Consult appreciated -PT/OT evaluation>>HHPT -Speech therapy eval -CT brain--age indeterminant infarct R-frontal lobe and R-caudate -MRI brain--Small acute infarction of the right frontal lobe with some involvement of the precentral gyrus -MRA brain--no LVO -Carotid Duplex--no hemodynamically significant stenosis -Echo--EF 70-75%, G1DD, no WMA, no PFO -LDL--115 -HbA1C--6.5 -Antiplatelet--ASA 325 mg & plavix 75  (aspirin 81 mg increased to 325 mg by neurology) -30 day heart monitory will be set up after d/c   Hypertension -Allowing for permissive hypertension -Holding amlodipine, spironolactone, enalapril temporarily>>restart after dc   Hypothyroidism -Continue Synthroid   Coronary artery disease -No chest pain presently -Restart Imdur -Continue aspirin and Plavix (has been on DAPT given residual disease by prior cath and recommended by Dr. Okey Dupre to continue indefinitely by review of prior notes),    Hyperlipidemia -LDL 115 -Continue statin  Diabetes Mellitus type 2 -9/12 A1C--6.5   Discharge Instructions   Allergies as of 06/18/2021   No Known Allergies      Medication List     TAKE these medications    amLODipine 10 MG tablet Commonly known as: NORVASC Take 1 tablet (10 mg total) by mouth daily.   aspirin 325 MG tablet Take 1 tablet (325 mg total) by mouth daily. Start taking on: June 19, 2021 What changed:  medication strength how much to take   clopidogrel 75 MG tablet Commonly known as: PLAVIX TAKE 1 TABLET BY MOUTH ONCE DAILY.   enalapril 20 MG tablet Commonly known as: VASOTEC TAKE 1 TABLET BY MOUTH ONCE DAILY.   ezetimibe 10 MG tablet Commonly known as: ZETIA TAKE 1 TABLET BY  MOUTH ONCE DAILY.   fluticasone 50 MCG/ACT nasal spray Commonly known as: FLONASE Place 1 spray into both nostrils daily. What changed:  when to take this reasons to  take this   isosorbide mononitrate 30 MG 24 hr tablet Commonly known as: IMDUR TAKE 1 TABLET BY MOUTH ONCE DAILY.   levothyroxine 150 MCG tablet Commonly known as: SYNTHROID Take 150 mcg by mouth daily.   nitroGLYCERIN 0.4 MG SL tablet Commonly known as: NITROSTAT Place 1 tablet (0.4 mg total) under the tongue every 5 (five) minutes as needed for chest pain.   rosuvastatin 40 MG tablet Commonly known as: CRESTOR TAKE 1 TABLET BY MOUTH ONCE DAILY.   spironolactone 25 MG tablet Commonly known as: ALDACTONE TAKE 1 TABLET BY MOUTH ONCE DAILY.        No Known Allergies  Consultations: neurology   Procedures/Studies: CT HEAD WO CONTRAST  Result Date: 06/16/2021 CLINICAL DATA:  Weakness difficulty speaking EXAM: CT HEAD WITHOUT CONTRAST TECHNIQUE: Contiguous axial images were obtained from the base of the skull through the vertex without intravenous contrast. COMPARISON:  CT brain 01/21/2017 FINDINGS: Brain: No hemorrhage or intracranial mass. Small age indeterminate cortical and subcortical infarct at the right frontal lobe, new since 2018. Age indeterminate lacunar infarct in the right caudate. Mild atrophy and chronic small vessel ischemic changes of the white matter. Nonenlarged ventricles Vascular: No hyperdense vessels. Vertebral and carotid vascular calcification Skull: Normal. Negative for fracture or focal lesion. Sinuses/Orbits: Fluid in the left maxillary sinus Other: None IMPRESSION: 1. Small age indeterminate infarcts within the right frontal lobe and right caudate. 2. Atrophy and chronic small vessel ischemic changes of the white matter Electronically Signed   By: Jasmine Pang M.D.   On: 06/16/2021 19:17   MR ANGIO HEAD WO CONTRAST  Result Date: 06/17/2021 CLINICAL DATA:  Transient ischemic attack EXAM: MRI HEAD WITHOUT CONTRAST MRA HEAD WITHOUT CONTRAST TECHNIQUE: Multiplanar, multi-echo pulse sequences of the brain and surrounding structures were acquired without  intravenous contrast. Angiographic images of the Circle of Willis were acquired using MRA technique without intravenous contrast. COMPARISON:  No pertinent prior exam. FINDINGS: MRI HEAD Brain: Small area cortical/subcortical reduced diffusion in the right frontal lobe including some involvement of the precentral gyrus. No evidence of hemorrhage. There is no intracranial mass or mass effect. There is no hydrocephalus or extra-axial fluid collection. Prominence of the ventricles and sulci reflects parenchymal volume loss patchy and confluent areas of T2 hyperintensity in the supratentorial white matter nonspecific but may reflect moderate chronic microvascular ischemic changes. There is a small chronic frontal cortical/subcortical infarct. Several chronic small-vessel infarcts are present including involvement of the corona radiata, right genu of the corpus callosum, and right caudate, left pons, and left cerebellum Vascular: Major vessel flow voids at the skull base are preserved. Skull and upper cervical spine: Normal marrow signal is preserved. Sinuses/Orbits: Paranasal sinuses are aerated. Orbits are unremarkable. Other: Sella is partially empty.  Mastoid air cells are clear. MRA HEAD Degraded by artifact. Intracranial internal carotid arteries are patent with atherosclerotic irregularity. Middle and anterior cerebral arteries are patent. Intracranial vertebral arteries, basilar artery, posterior cerebral arteries are patent. Areas of irregularity along the MCA, ACA, and PCA branches likely exaggerated by artifact but may represent atherosclerosis. IMPRESSION: Small acute infarction of the right frontal lobe with some involvement of the precentral gyrus. No hemorrhage. Moderate chronic microvascular ischemic changes. Several small infarcts as described. No proximal intracranial vessel occlusion. Probable atherosclerotic irregularity of more distal MCA, ACA,  and PCA branches with suboptimal evaluation due to  artifact. Electronically Signed   By: Guadlupe Spanish M.D.   On: 06/17/2021 12:10   MR BRAIN WO CONTRAST  Result Date: 06/17/2021 CLINICAL DATA:  Transient ischemic attack EXAM: MRI HEAD WITHOUT CONTRAST MRA HEAD WITHOUT CONTRAST TECHNIQUE: Multiplanar, multi-echo pulse sequences of the brain and surrounding structures were acquired without intravenous contrast. Angiographic images of the Circle of Willis were acquired using MRA technique without intravenous contrast. COMPARISON:  No pertinent prior exam. FINDINGS: MRI HEAD Brain: Small area cortical/subcortical reduced diffusion in the right frontal lobe including some involvement of the precentral gyrus. No evidence of hemorrhage. There is no intracranial mass or mass effect. There is no hydrocephalus or extra-axial fluid collection. Prominence of the ventricles and sulci reflects parenchymal volume loss patchy and confluent areas of T2 hyperintensity in the supratentorial white matter nonspecific but may reflect moderate chronic microvascular ischemic changes. There is a small chronic frontal cortical/subcortical infarct. Several chronic small-vessel infarcts are present including involvement of the corona radiata, right genu of the corpus callosum, and right caudate, left pons, and left cerebellum Vascular: Major vessel flow voids at the skull base are preserved. Skull and upper cervical spine: Normal marrow signal is preserved. Sinuses/Orbits: Paranasal sinuses are aerated. Orbits are unremarkable. Other: Sella is partially empty.  Mastoid air cells are clear. MRA HEAD Degraded by artifact. Intracranial internal carotid arteries are patent with atherosclerotic irregularity. Middle and anterior cerebral arteries are patent. Intracranial vertebral arteries, basilar artery, posterior cerebral arteries are patent. Areas of irregularity along the MCA, ACA, and PCA branches likely exaggerated by artifact but may represent atherosclerosis. IMPRESSION: Small acute  infarction of the right frontal lobe with some involvement of the precentral gyrus. No hemorrhage. Moderate chronic microvascular ischemic changes. Several small infarcts as described. No proximal intracranial vessel occlusion. Probable atherosclerotic irregularity of more distal MCA, ACA, and PCA branches with suboptimal evaluation due to artifact. Electronically Signed   By: Guadlupe Spanish M.D.   On: 06/17/2021 12:10   US Carotid Bilateral  Result Date: 06/17/2021 CLINICAL DATA:  87 year old female with a history of stroke EXAM: BILATERAL CAROTID DUPLEX ULTRASOUND TECHNIQUE: Wallace Cullens scale imaging, color Doppler and duplex ultrasound were performed of bilateral carotid and vertebral arteries in the neck. COMPARISON:  None. FINDINGS: Criteria: Quantification of carotid stenosis is based on velocity parameters that correlate the residual internal carotid diameter with NASCET-based stenosis levels, using the diameter of the distal internal carotid lumen as the denominator for stenosis measurement. The following velocity measurements were obtained: RIGHT ICA:  Systolic 80 cm/sec, Diastolic 16 cm/sec CCA:  74 cm/sec SYSTOLIC ICA/CCA RATIO:  1.1 ECA:  105 cm/sec LEFT ICA:  Systolic 94 cm/sec, Diastolic 15 cm/sec CCA:  95 cm/sec SYSTOLIC ICA/CCA RATIO:  1.0 ECA:  90 cm/sec Right Brachial SBP: Not acquired Left Brachial SBP: Not acquired RIGHT CAROTID ARTERY: No significant calcifications of the right common carotid artery. Intermediate waveform maintained. Heterogeneous and partially calcified plaque at the right carotid bifurcation. No significant lumen shadowing. Low resistance waveform of the right ICA. No significant tortuosity. RIGHT VERTEBRAL ARTERY: Antegrade flow with low resistance waveform. LEFT CAROTID ARTERY: No significant calcifications of the left common carotid artery. Intermediate waveform maintained. Heterogeneous and partially calcified plaque at the left carotid bifurcation without significant lumen  shadowing. Low resistance waveform of the left ICA. No significant tortuosity. LEFT VERTEBRAL ARTERY:  Antegrade flow with low resistance waveform. IMPRESSION: Color duplex indicates minimal heterogeneous and calcified plaque, with no hemodynamically  significant stenosis by duplex criteria in the extracranial cerebrovascular circulation. Signed, Yvone Neu. Reyne Dumas, RPVI [facility] Electronically Signed   By: Gilmer Mor D.O.   On: 06/17/2021 10:28   ECHOCARDIOGRAM COMPLETE  Result Date: 06/17/2021    ECHOCARDIOGRAM REPORT   Patient Name:   [name] [name] [facility] Date of Exam: 06/17/2021 Medical Rec #:  [id number]      Height:       60.0 in Accession #:    [id number]     Weight:       145.0 lb Date of Birth:  [birth year]       BSA:          1.628 m Patient Age:    87 years       BP:           119/59 mmHg Patient Gender: F              HR:           58 bpm. [facility] Procedure: 2D Echo, Cardiac Doppler and Color Doppler Indications:    Stroke  History:        Patient has prior history of Echocardiogram examinations, most                 recent 01/10/2016. CHF, Previous Myocardial Infarction and CAD,                 TIA, Signs/Symptoms:Shortness of Breath; Risk                 Factors:Hypertension and Dyslipidemia.  Sonographer:    Mikki Harbor Referring Phys: [id number] OLADAPO ADEFESO IMPRESSIONS  1. Left ventricular ejection fraction, by estimation, is 70 to 75%. The left ventricle has hyperdynamic function. The left ventricle has no regional wall motion abnormalities. Left ventricular diastolic parameters are consistent with Grade I diastolic dysfunction (impaired relaxation).  2. Right ventricular systolic function is normal. The right ventricular size is normal. There is normal pulmonary artery systolic pressure. The estimated right ventricular systolic pressure is 21.8 mmHg.  3. Left atrial size was mildly dilated.  4. The mitral valve is  degenerative. Mild mitral valve regurgitation.  5. The aortic valve is tricuspid. Aortic valve regurgitation is not visualized. No aortic stenosis is present. Aortic valve mean gradient measures 5.0 mmHg.  6. The inferior vena cava is normal in size with greater than 50% respiratory variability, suggesting right atrial pressure of 3 mmHg. Comparison(s): No prior Echocardiogram. FINDINGS  Left Ventricle: Left ventricular ejection fraction, by estimation, is 70 to 75%. The left ventricle has hyperdynamic function. The left ventricle has no regional wall motion abnormalities. The left ventricular internal cavity size was normal in size. There is no left ventricular hypertrophy. Left ventricular diastolic parameters are consistent with Grade I diastolic dysfunction (impaired relaxation). Right Ventricle: The right ventricular size is normal. No increase in right ventricular wall thickness. Right ventricular systolic function is normal. There is normal pulmonary artery systolic pressure. The tricuspid regurgitant velocity is 2.17 m/s, and  with an assumed right atrial pressure of 3 mmHg, the estimated right ventricular systolic pressure is 21.8 mmHg. Left Atrium: Left atrial size was mildly dilated. Right Atrium: Right atrial size was normal in size. Pericardium: There is no evidence of pericardial effusion. Mitral Valve: The mitral valve is degenerative in appearance. There is mild thickening of the mitral valve leaflet(s). Mild mitral valve regurgitation. MV peak gradient, 7.3 mmHg. The mean  mitral valve gradient is 2.0 mmHg. Tricuspid Valve: The tricuspid valve is grossly normal. Tricuspid valve regurgitation is trivial. Aortic Valve: The aortic valve is tricuspid. There is mild aortic valve annular calcification. Aortic valve regurgitation is not visualized. No aortic stenosis is present. Aortic valve mean gradient measures 5.0 mmHg. Aortic valve peak gradient measures 8.6 mmHg. Aortic valve area, by VTI measures  2.21 cm. Pulmonic Valve: The pulmonic valve was grossly normal. Pulmonic valve regurgitation is trivial. Aorta: The aortic root is normal in size and structure. Venous: The inferior vena cava is normal in size with greater than 50% respiratory variability, suggesting right atrial pressure of 3 mmHg. IAS/Shunts: No atrial level shunt detected by color flow Doppler.  LEFT VENTRICLE PLAX 2D LVIDd:         4.83 cm  Diastology LVIDs:         2.14 cm  LV e' medial:    6.13 cm/s LV PW:         0.83 cm  LV E/e' medial:  10.8 LV IVS:        0.93 cm  LV e' lateral:   5.61 cm/s LVOT diam:     1.80 cm  LV E/e' lateral: 11.8 LV SV:         76 LV SV Index:   47 LVOT Area:     2.54 cm  RIGHT VENTRICLE RV Basal diam:  3.52 cm RV Mid diam:    3.07 cm RV S prime:     12.10 cm/s TAPSE (M-mode): 2.3 cm LEFT ATRIUM             Index       RIGHT ATRIUM           Index LA diam:        3.30 cm 2.03 cm/m  RA Area:     17.00 cm LA Vol (A2C):   45.6 ml 28.01 ml/m RA Volume:   47.70 ml  29.30 ml/m LA Vol (A4C):   61.9 ml 38.02 ml/m LA Biplane Vol: 53.6 ml 32.92 ml/m  AORTIC VALVE AV Area (Vmax):    2.29 cm AV Area (Vmean):   2.11 cm AV Area (VTI):     2.21 cm AV Vmax:           147.00 cm/s AV Vmean:          102.000 cm/s AV VTI:            0.346 m AV Peak Grad:      8.6 mmHg AV Mean Grad:      5.0 mmHg LVOT Vmax:         132.00 cm/s LVOT Vmean:        84.500 cm/s LVOT VTI:          0.300 m LVOT/AV VTI ratio: 0.87 MITRAL VALVE                TRICUSPID VALVE MV Area (PHT): 3.03 cm     TR Peak grad:   18.8 mmHg MV Area VTI:   2.34 cm     TR Vmax:        217.00 cm/s MV Peak grad:  7.3 mmHg MV Mean grad:  2.0 mmHg     SHUNTS MV Vmax:       1.35 m/s     Systemic VTI:  0.30 m MV Vmean:      56.2 cm/s    Systemic Diam: 1.80 cm MV Decel Time: 250 msec MV E  velocity: 66.00 cm/s MV A velocity: 105.00 cm/s MV E/A ratio:  0.63 Nona Dell MD Electronically signed by Nona Dell MD Signature Date/Time: 06/17/2021/11:16:53 AM    Final          Discharge Exam: Vitals:   06/18/21 0153 06/18/21 0931  BP: (!) 147/54 (!) 165/66  Pulse: (!) 54 (!) 55  Resp: 18 18  Temp: 97.9 F (36.6 C) 97.7 F (36.5 C)  SpO2: 99% 98%   Vitals:   06/17/21 1825 06/17/21 2250 06/18/21 0153 06/18/21 0931  BP: (!) 163/64 (!) 160/62 (!) 147/54 (!) 165/66  Pulse: (!) 58 (!) 51 (!) 54 (!) 55  Resp:  19 18 18   Temp: 98.2 F (36.8 C) 98.1 F (36.7 C) 97.9 F (36.6 C) 97.7 F (36.5 C)  TempSrc: Oral Oral Oral Oral  SpO2: 97% 95% 99% 98%  Weight:      Height:        General: Pt is alert, awake, not in acute distress Cardiovascular: RRR, S1/S2 +, no rubs, no gallops Respiratory: CTA bilaterally, no wheezing, no rhonchi Abdominal: Soft, NT, ND, bowel sounds + Extremities: no edema, no cyanosis   The results of significant diagnostics from this hospitalization (including imaging, microbiology, ancillary and laboratory) are listed below for reference.    Significant Diagnostic Studies: CT HEAD WO CONTRAST  Result Date: 06/16/2021 CLINICAL DATA:  Weakness difficulty speaking EXAM: CT HEAD WITHOUT CONTRAST TECHNIQUE: Contiguous axial images were obtained from the base of the skull through the vertex without intravenous contrast. COMPARISON:  CT brain 01/21/2017 FINDINGS: Brain: No hemorrhage or intracranial mass. Small age indeterminate cortical and subcortical infarct at the right frontal lobe, new since 2018. Age indeterminate lacunar infarct in the right caudate. Mild atrophy and chronic small vessel ischemic changes of the white matter. Nonenlarged ventricles Vascular: No hyperdense vessels. Vertebral and carotid vascular calcification Skull: Normal. Negative for fracture or focal lesion. Sinuses/Orbits: Fluid in the left maxillary sinus Other: None IMPRESSION: 1. Small age indeterminate infarcts within the right frontal lobe and right caudate. 2. Atrophy and chronic small vessel ischemic changes of the white matter Electronically Signed    By: 2019 M.D.   On: 06/16/2021 19:17   MR ANGIO HEAD WO CONTRAST  Result Date: 06/17/2021 CLINICAL DATA:  Transient ischemic attack EXAM: MRI HEAD WITHOUT CONTRAST MRA HEAD WITHOUT CONTRAST TECHNIQUE: Multiplanar, multi-echo pulse sequences of the brain and surrounding structures were acquired without intravenous contrast. Angiographic images of the Circle of Willis were acquired using MRA technique without intravenous contrast. COMPARISON:  No pertinent prior exam. FINDINGS: MRI HEAD Brain: Small area cortical/subcortical reduced diffusion in the right frontal lobe including some involvement of the precentral gyrus. No evidence of hemorrhage. There is no intracranial mass or mass effect. There is no hydrocephalus or extra-axial fluid collection. Prominence of the ventricles and sulci reflects parenchymal volume loss patchy and confluent areas of T2 hyperintensity in the supratentorial white matter nonspecific but may reflect moderate chronic microvascular ischemic changes. There is a small chronic frontal cortical/subcortical infarct. Several chronic small-vessel infarcts are present including involvement of the corona radiata, right genu of the corpus callosum, and right caudate, left pons, and left cerebellum Vascular: Major vessel flow voids at the skull base are preserved. Skull and upper cervical spine: Normal marrow signal is preserved. Sinuses/Orbits: Paranasal sinuses are aerated. Orbits are unremarkable. Other: Sella is partially empty.  Mastoid air cells are clear. MRA HEAD Degraded by artifact. Intracranial internal carotid arteries are patent with atherosclerotic  irregularity. Middle and anterior cerebral arteries are patent. Intracranial vertebral arteries, basilar artery, posterior cerebral arteries are patent. Areas of irregularity along the MCA, ACA, and PCA branches likely exaggerated by artifact but may represent atherosclerosis. IMPRESSION: Small acute infarction of the right frontal  lobe with some involvement of the precentral gyrus. No hemorrhage. Moderate chronic microvascular ischemic changes. Several small infarcts as described. No proximal intracranial vessel occlusion. Probable atherosclerotic irregularity of more distal MCA, ACA, and PCA branches with suboptimal evaluation due to artifact. Electronically Signed   By: Guadlupe Spanish M.D.   On: 06/17/2021 12:10   MR BRAIN WO CONTRAST  Result Date: 06/17/2021 CLINICAL DATA:  Transient ischemic attack EXAM: MRI HEAD WITHOUT CONTRAST MRA HEAD WITHOUT CONTRAST TECHNIQUE: Multiplanar, multi-echo pulse sequences of the brain and surrounding structures were acquired without intravenous contrast. Angiographic images of the Circle of Willis were acquired using MRA technique without intravenous contrast. COMPARISON:  No pertinent prior exam. FINDINGS: MRI HEAD Brain: Small area cortical/subcortical reduced diffusion in the right frontal lobe including some involvement of the precentral gyrus. No evidence of hemorrhage. There is no intracranial mass or mass effect. There is no hydrocephalus or extra-axial fluid collection. Prominence of the ventricles and sulci reflects parenchymal volume loss patchy and confluent areas of T2 hyperintensity in the supratentorial white matter nonspecific but may reflect moderate chronic microvascular ischemic changes. There is a small chronic frontal cortical/subcortical infarct. Several chronic small-vessel infarcts are present including involvement of the corona radiata, right genu of the corpus callosum, and right caudate, left pons, and left cerebellum Vascular: Major vessel flow voids at the skull base are preserved. Skull and upper cervical spine: Normal marrow signal is preserved. Sinuses/Orbits: Paranasal sinuses are aerated. Orbits are unremarkable. Other: Sella is partially empty.  Mastoid air cells are clear. MRA HEAD Degraded by artifact. Intracranial internal carotid arteries are patent with  atherosclerotic irregularity. Middle and anterior cerebral arteries are patent. Intracranial vertebral arteries, basilar artery, posterior cerebral arteries are patent. Areas of irregularity along the MCA, ACA, and PCA branches likely exaggerated by artifact but may represent atherosclerosis. IMPRESSION: Small acute infarction of the right frontal lobe with some involvement of the precentral gyrus. No hemorrhage. Moderate chronic microvascular ischemic changes. Several small infarcts as described. No proximal intracranial vessel occlusion. Probable atherosclerotic irregularity of more distal MCA, ACA, and PCA branches with suboptimal evaluation due to artifact. Electronically Signed   By: Guadlupe Spanish M.D.   On: 06/17/2021 12:10   US Carotid Bilateral  Result Date: 06/17/2021 CLINICAL DATA:  87 year old female with a history of stroke EXAM: BILATERAL CAROTID DUPLEX ULTRASOUND TECHNIQUE: Wallace Cullens scale imaging, color Doppler and duplex ultrasound were performed of bilateral carotid and vertebral arteries in the neck. COMPARISON:  None. FINDINGS: Criteria: Quantification of carotid stenosis is based on velocity parameters that correlate the residual internal carotid diameter with NASCET-based stenosis levels, using the diameter of the distal internal carotid lumen as the denominator for stenosis measurement. The following velocity measurements were obtained: RIGHT ICA:  Systolic 80 cm/sec, Diastolic 16 cm/sec CCA:  74 cm/sec SYSTOLIC ICA/CCA RATIO:  1.1 ECA:  105 cm/sec LEFT ICA:  Systolic 94 cm/sec, Diastolic 15 cm/sec CCA:  95 cm/sec SYSTOLIC ICA/CCA RATIO:  1.0 ECA:  90 cm/sec Right Brachial SBP: Not acquired Left Brachial SBP: Not acquired RIGHT CAROTID ARTERY: No significant calcifications of the right common carotid artery. Intermediate waveform maintained. Heterogeneous and partially calcified plaque at the right carotid bifurcation. No significant lumen shadowing. Low resistance waveform of  the right ICA. No  significant tortuosity. RIGHT VERTEBRAL ARTERY: Antegrade flow with low resistance waveform. LEFT CAROTID ARTERY: No significant calcifications of the left common carotid artery. Intermediate waveform maintained. Heterogeneous and partially calcified plaque at the left carotid bifurcation without significant lumen shadowing. Low resistance waveform of the left ICA. No significant tortuosity. LEFT VERTEBRAL ARTERY:  Antegrade flow with low resistance waveform. IMPRESSION: Color duplex indicates minimal heterogeneous and calcified plaque, with no hemodynamically significant stenosis by duplex criteria in the extracranial cerebrovascular circulation. Signed, Yvone Neu. Reyne Dumas, RPVI [facility] Electronically Signed   By: Gilmer Mor D.O.   On: 06/17/2021 10:28   ECHOCARDIOGRAM COMPLETE  Result Date: 06/17/2021    ECHOCARDIOGRAM REPORT   Patient Name:   [name] [name] [facility] Date of Exam: 06/17/2021 Medical Rec #:  [id number]      Height:       60.0 in Accession #:    [id number]     Weight:       145.0 lb Date of Birth:  April 29, 1934       BSA:          1.628 m Patient Age:    87 years       BP:           119/59 mmHg Patient Gender: F              HR:           58 bpm. [facility] Procedure: 2D Echo, Cardiac Doppler and Color Doppler Indications:    Stroke  History:        Patient has prior history of Echocardiogram examinations, most                 recent 01/10/2016. CHF, Previous Myocardial Infarction and CAD,                 TIA, Signs/Symptoms:Shortness of Breath; Risk                 Factors:Hypertension and Dyslipidemia.  Sonographer:    Mikki Harbor Referring Phys: [id number] OLADAPO ADEFESO IMPRESSIONS  1. Left ventricular ejection fraction, by estimation, is 70 to 75%. The left ventricle has hyperdynamic function. The left ventricle has no regional wall motion abnormalities. Left ventricular diastolic parameters are consistent with Grade I  diastolic dysfunction (impaired relaxation).  2. Right ventricular systolic function is normal. The right ventricular size is normal. There is normal pulmonary artery systolic pressure. The estimated right ventricular systolic pressure is 21.8 mmHg.  3. Left atrial size was mildly dilated.  4. The mitral valve is degenerative. Mild mitral valve regurgitation.  5. The aortic valve is tricuspid. Aortic valve regurgitation is not visualized. No aortic stenosis is present. Aortic valve mean gradient measures 5.0 mmHg.  6. The inferior vena cava is normal in size with greater than 50% respiratory variability, suggesting right atrial pressure of 3 mmHg. Comparison(s): No prior Echocardiogram. FINDINGS  Left Ventricle: Left ventricular ejection fraction, by estimation, is 70 to 75%. The left ventricle has hyperdynamic function. The left ventricle has no regional wall motion abnormalities. The left ventricular internal cavity size was normal in size. There is no left ventricular hypertrophy. Left ventricular diastolic parameters are consistent with Grade I diastolic dysfunction (impaired relaxation). Right Ventricle: The right ventricular size is normal. No increase in right ventricular wall thickness. Right ventricular systolic function is normal. There is normal pulmonary artery systolic pressure. The tricuspid regurgitant velocity  is 2.17 m/s, and  with an assumed right atrial pressure of 3 mmHg, the estimated right ventricular systolic pressure is 21.8 mmHg. Left Atrium: Left atrial size was mildly dilated. Right Atrium: Right atrial size was normal in size. Pericardium: There is no evidence of pericardial effusion. Mitral Valve: The mitral valve is degenerative in appearance. There is mild thickening of the mitral valve leaflet(s). Mild mitral valve regurgitation. MV peak gradient, 7.3 mmHg. The mean mitral valve gradient is 2.0 mmHg. Tricuspid Valve: The tricuspid valve is grossly normal. Tricuspid valve regurgitation  is trivial. Aortic Valve: The aortic valve is tricuspid. There is mild aortic valve annular calcification. Aortic valve regurgitation is not visualized. No aortic stenosis is present. Aortic valve mean gradient measures 5.0 mmHg. Aortic valve peak gradient measures 8.6 mmHg. Aortic valve area, by VTI measures 2.21 cm. Pulmonic Valve: The pulmonic valve was grossly normal. Pulmonic valve regurgitation is trivial. Aorta: The aortic root is normal in size and structure. Venous: The inferior vena cava is normal in size with greater than 50% respiratory variability, suggesting right atrial pressure of 3 mmHg. IAS/Shunts: No atrial level shunt detected by color flow Doppler.  LEFT VENTRICLE PLAX 2D LVIDd:         4.83 cm  Diastology LVIDs:         2.14 cm  LV e' medial:    6.13 cm/s LV PW:         0.83 cm  LV E/e' medial:  10.8 LV IVS:        0.93 cm  LV e' lateral:   5.61 cm/s LVOT diam:     1.80 cm  LV E/e' lateral: 11.8 LV SV:         76 LV SV Index:   47 LVOT Area:     2.54 cm  RIGHT VENTRICLE RV Basal diam:  3.52 cm RV Mid diam:    3.07 cm RV S prime:     12.10 cm/s TAPSE (M-mode): 2.3 cm LEFT ATRIUM             Index       RIGHT ATRIUM           Index LA diam:        3.30 cm 2.03 cm/m  RA Area:     17.00 cm LA Vol (A2C):   45.6 ml 28.01 ml/m RA Volume:   47.70 ml  29.30 ml/m LA Vol (A4C):   61.9 ml 38.02 ml/m LA Biplane Vol: 53.6 ml 32.92 ml/m  AORTIC VALVE AV Area (Vmax):    2.29 cm AV Area (Vmean):   2.11 cm AV Area (VTI):     2.21 cm AV Vmax:           147.00 cm/s AV Vmean:          102.000 cm/s AV VTI:            0.346 m AV Peak Grad:      8.6 mmHg AV Mean Grad:      5.0 mmHg LVOT Vmax:         132.00 cm/s LVOT Vmean:        84.500 cm/s LVOT VTI:          0.300 m LVOT/AV VTI ratio: 0.87 MITRAL VALVE                TRICUSPID VALVE MV Area (PHT): 3.03 cm     TR Peak grad:   18.8 mmHg MV Area VTI:  2.34 cm     TR Vmax:        217.00 cm/s MV Peak grad:  7.3 mmHg MV Mean grad:  2.0 mmHg     SHUNTS MV  Vmax:       1.35 m/s     Systemic VTI:  0.30 m MV Vmean:      56.2 cm/s    Systemic Diam: 1.80 cm MV Decel Time: 250 msec MV E velocity: 66.00 cm/s MV A velocity: 105.00 cm/s MV E/A ratio:  0.63 Nona Dell MD Electronically signed by Nona Dell MD Signature Date/Time: 06/17/2021/11:16:53 AM    Final     Microbiology: Recent Results (from the past 240 hour(s))  SARS CORONAVIRUS 2 (Corina Stacy 6-24 HRS) Nasopharyngeal Nasopharyngeal Swab     Status: None   Collection Time: 06/16/21  9:20 PM   Specimen: Nasopharyngeal Swab  Result Value Ref Range Status   SARS Coronavirus 2 NEGATIVE NEGATIVE Final    Comment: (NOTE) SARS-CoV-2 target nucleic acids are NOT DETECTED.  The SARS-CoV-2 RNA is generally detectable in upper and lower respiratory specimens during the acute phase of infection. Negative results do not preclude SARS-CoV-2 infection, do not rule out co-infections with other pathogens, and should not be used as the sole basis for treatment or other patient management decisions. Negative results must be combined with clinical observations, patient history, and epidemiological information. The expected result is Negative.  Fact Sheet for Patients: HairSlick.no  Fact Sheet for Healthcare Providers: quierodirigir.com  This test is not yet approved or cleared by the [place] FDA and  has been authorized for detection and/or diagnosis of SARS-CoV-2 by FDA under an Emergency Use Authorization (EUA). This EUA will remain  in effect (meaning this test can be used) for the duration of the COVID-19 declaration under Se ction 564(b)(1) of the Act, 21 U.S.C. section 360bbb-3(b)(1), unless the authorization is terminated or revoked sooner.  Performed at [facility], [address] [place], [place] [address]      Labs: Basic Metabolic Panel: Recent Labs  Lab 06/16/21 1915 06/17/21 0611 06/18/21 0600  NA 138 142 140   K 3.8 4.0 3.7  CL 106 104 106  CO2 27 29 27   GLUCOSE 197* 119* 126*  BUN 12 10 10   CREATININE 0.73 0.63 0.72  CALCIUM 8.5* 9.1 8.6*  MG  --  2.1 2.1  PHOS  --  3.5  --    Liver Function Tests: Recent Labs  Lab 06/16/21 1915 06/17/21 0611  AST 21 18  ALT 15 14  ALKPHOS 71 65  BILITOT 0.3 0.7  PROT 8.1 7.6  ALBUMIN 4.1 3.8   No results for input(s): LIPASE, AMYLASE in the last 168 hours. No results for input(s): AMMONIA in the last 168 hours. CBC: Recent Labs  Lab 06/16/21 1915 06/17/21 0611  WBC 4.7 4.7  NEUTROABS 2.3  --   HGB 12.7 12.8  HCT 39.9 40.0  MCV 99.3 99.5  PLT 198 206   Cardiac Enzymes: No results for input(s): CKTOTAL, CKMB, CKMBINDEX, TROPONINI in the last 168 hours. BNP: Invalid input(s): POCBNP CBG: Recent Labs  Lab 06/16/21 1923  GLUCAP 192*    Time coordinating discharge:  36 minutes  Signed:  08/17/21, DO [facility] Pager: [phone] 06/18/2021, 12:00 PM

## 2021-06-18 NOTE — Plan of Care (Signed)
Problem: Education: Goal: Knowledge of General Education information will improve Description: Including pain rating scale, medication(s)/side effects and non-pharmacologic comfort measures 06/18/2021 1245 by Sheela Stack, RN Outcome: Adequate for Discharge 06/18/2021 1245 by Sheela Stack, RN Outcome: Adequate for Discharge   Problem: Health Behavior/Discharge Planning: Goal: Ability to manage health-related needs will improve 06/18/2021 1245 by Sheela Stack, RN Outcome: Adequate for Discharge 06/18/2021 1245 by Sheela Stack, RN Outcome: Adequate for Discharge   Problem: Clinical Measurements: Goal: Ability to maintain clinical measurements within normal limits will improve 06/18/2021 1245 by Sheela Stack, RN Outcome: Adequate for Discharge 06/18/2021 1245 by Sheela Stack, RN Outcome: Adequate for Discharge Goal: Will remain free from infection 06/18/2021 1245 by Sheela Stack, RN Outcome: Adequate for Discharge 06/18/2021 1245 by Sheela Stack, RN Outcome: Adequate for Discharge Goal: Diagnostic test results will improve 06/18/2021 1245 by Sheela Stack, RN Outcome: Adequate for Discharge 06/18/2021 1245 by Sheela Stack, RN Outcome: Adequate for Discharge Goal: Respiratory complications will improve 06/18/2021 1245 by Sheela Stack, RN Outcome: Adequate for Discharge 06/18/2021 1245 by Sheela Stack, RN Outcome: Adequate for Discharge Goal: Cardiovascular complication will be avoided 06/18/2021 1245 by Sheela Stack, RN Outcome: Adequate for Discharge 06/18/2021 1245 by Sheela Stack, RN Outcome: Adequate for Discharge   Problem: Activity: Goal: Risk for activity intolerance will decrease 06/18/2021 1245 by Sheela Stack, RN Outcome: Adequate for Discharge 06/18/2021 1245 by Sheela Stack, RN Outcome: Adequate for Discharge   Problem: Nutrition: Goal: Adequate nutrition will be maintained 06/18/2021 1245 by Sheela Stack, RN Outcome:  Adequate for Discharge 06/18/2021 1245 by Sheela Stack, RN Outcome: Adequate for Discharge   Problem: Coping: Goal: Level of anxiety will decrease 06/18/2021 1245 by Sheela Stack, RN Outcome: Adequate for Discharge 06/18/2021 1245 by Sheela Stack, RN Outcome: Adequate for Discharge   Problem: Elimination: Goal: Will not experience complications related to bowel motility 06/18/2021 1245 by Sheela Stack, RN Outcome: Adequate for Discharge 06/18/2021 1245 by Sheela Stack, RN Outcome: Adequate for Discharge Goal: Will not experience complications related to urinary retention 06/18/2021 1245 by Sheela Stack, RN Outcome: Adequate for Discharge 06/18/2021 1245 by Sheela Stack, RN Outcome: Adequate for Discharge   Problem: Pain Managment: Goal: General experience of comfort will improve 06/18/2021 1245 by Sheela Stack, RN Outcome: Adequate for Discharge 06/18/2021 1245 by Sheela Stack, RN Outcome: Adequate for Discharge   Problem: Safety: Goal: Ability to remain free from injury will improve 06/18/2021 1245 by Sheela Stack, RN Outcome: Adequate for Discharge 06/18/2021 1245 by Sheela Stack, RN Outcome: Adequate for Discharge   Problem: Skin Integrity: Goal: Risk for impaired skin integrity will decrease 06/18/2021 1245 by Sheela Stack, RN Outcome: Adequate for Discharge 06/18/2021 1245 by Sheela Stack, RN Outcome: Adequate for Discharge   Problem: Education: Goal: Knowledge of disease or condition will improve 06/18/2021 1245 by Sheela Stack, RN Outcome: Adequate for Discharge 06/18/2021 1245 by Sheela Stack, RN Outcome: Adequate for Discharge Goal: Knowledge of secondary prevention will improve 06/18/2021 1245 by Sheela Stack, RN Outcome: Adequate for Discharge 06/18/2021 1245 by Sheela Stack, RN Outcome: Adequate for Discharge Goal: Knowledge of patient specific risk factors addressed and post discharge goals established will  improve 06/18/2021 1245 by Sheela Stack, RN Outcome: Adequate for Discharge 06/18/2021 1245 by Sheela Stack, RN Outcome: Adequate for Discharge Goal: Individualized Educational  Video(s) 06/18/2021 1245 by Sheela Stack, RN Outcome: Adequate for Discharge 06/18/2021 1245 by Sheela Stack, RN Outcome: Adequate for Discharge   Problem: Coping: Goal: Will verbalize positive feelings about self 06/18/2021 1245 by Sheela Stack, RN Outcome: Adequate for Discharge 06/18/2021 1245 by Sheela Stack, RN Outcome: Adequate for Discharge

## 2021-06-18 NOTE — Evaluation (Signed)
Occupational Therapy Evaluation Patient Details Name: [name] [name] MRN: [id number] DOB: January 03, 1934 Today's Date: 06/18/2021   History of Present Illness [name] [name] is a 87 y.o. female with medical history significant for hypertension, hyperlipidemia, hypothyroidism, CAD s/p DES to LAD (2008) who presents to the emergency department due to slurred speech noticed by family around 9 PM last night.  Patient's family noted patient presenting with slurred speech with difficulty to understand around 9 PM to 10 PM yesterday (8/10), she also had difficulty to pick things up with left hand and gait was unsteady.  She went to bed and woke up this morning still with difficulty with speech around 6:30 AM, she complained of generalized weakness, but speech appeared to improve throughout the day.  Son thought that he saw a left-sided facial droop and she was taken to the ED for further evaluation and management.  Patient denies chest pain, shortness of breath, nausea, vomiting or any facial pain/weakness   Clinical Impression   Pt agreeable to OT evaluation. Pt able demonstrate mod I level of assist for bed mobility. Pt was able to doff and don sock at EOB with mod I level of assist. Pt required SPV assist for mobility to sink and transfer to toilet with use of cane. Pt appears to be near baseline levels and is not recommended for further acute OT services. Pt demonstrated possible mild vision deficits in regards to tracking and visual fields, pt may benefit from evaluation from optometrist with a neuro specialization. Pt will be discharged to care of nursing staff for the remainder of her stay.      Recommendations for follow up therapy are one component of a multi-disciplinary discharge planning process, led by the attending physician.  Recommendations may be updated based on patient status, additional functional criteria and insurance authorization.   Follow Up Recommendations  Home health OT;Supervision -  Intermittent (SPV for mobility)    Equipment Recommendations  None recommended by OT    Recommendations for Other Services Other (comment) (nuero vision specialist)     Precautions / Restrictions Precautions Precautions: Fall Restrictions Weight Bearing Restrictions: No      Mobility Bed Mobility Overal bed mobility: Independent                  Transfers Overall transfer level: Needs assistance Equipment used: Straight cane Transfers: Sit to/from Stand;Stand Pivot Transfers Sit to Stand: Supervision Stand pivot transfers: Supervision       General transfer comment: slow labored movement    Balance Overall balance assessment: Mild deficits observed, not formally tested (using straight cane)                                         ADL either performed or assessed with clinical judgement   ADL Overall ADL's : Needs assistance/impaired                     Lower Body Dressing: Modified independent;Sitting/lateral leans Lower Body Dressing Details (indicate cue type and reason): Able to doff and don one sock while seated at EOB. Toilet Transfer: Supervision/safety;Ambulation Toilet Transfer Details (indicate cue type and reason): Pt able to ambulate to toilet from EOB using cane.                 Vision Baseline Vision/History: 1 Wears glasses (reading) Ability to See in Adequate Light: 0 Adequate  Patient Visual Report: No change from baseline Vision Assessment?: Yes Eye Alignment: Within Functional Limits Tracking/Visual Pursuits: Decreased smoothness of eye movement to LEFT superior field Saccades: Within functional limits Convergence: Impaired (comment) (Pt did not demonstrate convergence when prompted. Noted to close eyese as if she was having difficulty.) Visual Fields: Other (comment) (Possible very mild L side visual field deficit. Pt reported mild difference in peripheral vsion between L and R side during testing.)                 Pertinent Vitals/Pain Pain Assessment: No/denies pain     Hand Dominance Right   Extremity/Trunk Assessment Upper Extremity Assessment Upper Extremity Assessment: Overall WFL for tasks assessed   Lower Extremity Assessment Lower Extremity Assessment: Defer to PT evaluation   Cervical / Trunk Assessment Cervical / Trunk Assessment: Normal   Communication Communication Communication: No difficulties   Cognition Arousal/Alertness: Awake/alert Behavior During Therapy: WFL for tasks assessed/performed Overall Cognitive Status: Within Functional Limits for tasks assessed                                           Exercises Other Exercises Other Exercises: Tandem stance 2x 20" with intermittent HHA Other Exercises: sidestep 2RT x 6 ft   Shoulder Instructions      Home Living Family/patient expects to be discharged to:: Private residence Living Arrangements: Children Available Help at Discharge: Family;Available 24 hours/day Type of Home: House Home Access: Stairs to enter [employer] of Steps: 4 Entrance Stairs-Rails: Right;Left;Can reach both Home Layout: Laundry or work area in basement;Two level Alternate Level Stairs-Number of Steps: 10 Alternate Level Stairs-Rails: Left Bathroom Shower/Tub: Tub/shower unit   Bathroom Toilet: Handicapped height Bathroom Accessibility: Yes   Home Equipment: Walker - 2 wheels;Cane - single point;Shower seat;Bedside commode   Additional Comments: taken via PT notes      Prior Functioning/Environment Level of Independence: Needs assistance  Gait / Transfers Assistance Needed: household ambulator without AD, uses SPC for longer distances ADL's / Homemaking Assistance Needed: Pt reported that she did not need assist for ADL or IADL's                     OT Goals(Current goals can be found in the care plan section) Acute Rehab OT Goals Patient Stated Goal: return home with family to  assist  OT Frequency:      End of Session Equipment Utilized During Treatment: Other (comment) (cane)  Activity Tolerance: Patient tolerated treatment well Patient left: in bed;with call bell/phone within reach;with family/visitor present;with bed alarm set  OT Visit Diagnosis: Unsteadiness on feet (R26.81)                Time: 7893-8101 OT Time Calculation (min): 16 min Charges:  OT General Charges $OT Visit: 1 Visit OT Evaluation $OT Eval Low Complexity: 1 Low  Ramzey Petrovic OT, MOT  Danie Chandler 06/18/2021, 11:33 AM

## 2021-06-18 NOTE — TOC Transition Note (Signed)
Transition of Care [facility]) - CM/SW Discharge Note   Patient Details  Name: [name] [name] MRN: [id number] Date of Birth: August 09, 1934  [facility] of [facility]) CM/SW Contact:  Barry Brunner, LCSW Phone Number: 06/18/2021, 3:48 PM   Clinical Narrative:    CSW contacted patient's son and daughter to discuss HH. Patient's family agreeable to [facility]. CSW referred patient to [facility], and [facility]. HH companies not able to provide services due to insurance and patient residing in [place]. CSW notified patient's son and inquired about OPPT. Patient's son reported that the family was unsure if they would be able to transport patient and expressed that he would discuss it with his sisters. CSW followed up with patient's son and inquired if they would like to schedule [facility]. Patient son reported that he would call back with family's decision. CSW did not receive follow up. TOC signing off.         Patient Goals and CMS Choice        Discharge Placement                       Discharge Plan and Services                                     Social Determinants of Health (SDOH) Interventions     Readmission Risk Interventions No flowsheet data found.

## 2021-06-18 NOTE — Telephone Encounter (Signed)
-----   Message from Catarina Hartshorn, MD sent at 06/18/2021 11:44 AM EDT ----- Can you please help me set up a 30 day monitor for this patient for stroke.  Results to go to Dr. Gerilyn Pilgrim. She is going home today--06/18/21  Thank you,  Onalee Hua

## 2021-06-27 DIAGNOSIS — I639 Cerebral infarction, unspecified: Secondary | ICD-10-CM

## 2021-06-28 ENCOUNTER — Ambulatory Visit: Payer: Medicaid Other

## 2021-06-28 ENCOUNTER — Telehealth: Payer: Self-pay | Admitting: Cardiology

## 2021-06-28 ENCOUNTER — Other Ambulatory Visit: Payer: Self-pay

## 2021-06-28 NOTE — Telephone Encounter (Signed)
PERCERT:  30 day [employer]

## 2021-07-01 ENCOUNTER — Telehealth: Payer: Self-pay

## 2021-07-01 NOTE — Telephone Encounter (Signed)
Contacted by [employer] regarding critical event monitor for patient.   Events occurred on 06/30/21 at 9:30 pm (both episodes within 1 minute of each other) , 7 second run of VT with 14 beats, HR 173 bpm, and a second episode lasting 6 seconds with 9 beats of VT. Prior rhythm was SR.   Scanned into medi and sent to DOD, Dr.McDowell for review.

## 2021-07-02 NOTE — Telephone Encounter (Signed)
Noted  

## 2021-07-02 NOTE — Telephone Encounter (Signed)
Thx for review, will review full report when uploaded  Dominga Ferry MD

## 2021-07-19 ENCOUNTER — Emergency Department (HOSPITAL_COMMUNITY)
Admission: EM | Admit: 2021-07-19 | Discharge: 2021-07-19 | Disposition: A | Discharge status: Home / Self Care | Payer: Medicare Other | Attending: Emergency Medicine | Admitting: Emergency Medicine

## 2021-07-19 ENCOUNTER — Emergency Department (HOSPITAL_COMMUNITY): Payer: Medicare Other

## 2021-07-19 ENCOUNTER — Encounter (HOSPITAL_COMMUNITY): Payer: Self-pay

## 2021-07-19 ENCOUNTER — Other Ambulatory Visit: Payer: Self-pay

## 2021-07-19 DIAGNOSIS — Z96643 Presence of artificial hip joint, bilateral: Secondary | ICD-10-CM | POA: Diagnosis not present

## 2021-07-19 DIAGNOSIS — I2510 Atherosclerotic heart disease of native coronary artery without angina pectoris: Secondary | ICD-10-CM | POA: Insufficient documentation

## 2021-07-19 DIAGNOSIS — Z7902 Long term (current) use of antithrombotics/antiplatelets: Secondary | ICD-10-CM | POA: Insufficient documentation

## 2021-07-19 DIAGNOSIS — G4489 Other headache syndrome: Secondary | ICD-10-CM | POA: Diagnosis not present

## 2021-07-19 DIAGNOSIS — W19XXXA Unspecified fall, initial encounter: Secondary | ICD-10-CM

## 2021-07-19 DIAGNOSIS — Z951 Presence of aortocoronary bypass graft: Secondary | ICD-10-CM | POA: Diagnosis not present

## 2021-07-19 DIAGNOSIS — Z20822 Contact with and (suspected) exposure to covid-19: Secondary | ICD-10-CM | POA: Diagnosis not present

## 2021-07-19 DIAGNOSIS — Z7982 Long term (current) use of aspirin: Secondary | ICD-10-CM | POA: Insufficient documentation

## 2021-07-19 DIAGNOSIS — Y900 Blood alcohol level of less than 20 mg/100 ml: Secondary | ICD-10-CM | POA: Diagnosis not present

## 2021-07-19 DIAGNOSIS — E039 Hypothyroidism, unspecified: Secondary | ICD-10-CM | POA: Diagnosis not present

## 2021-07-19 DIAGNOSIS — I10 Essential (primary) hypertension: Secondary | ICD-10-CM | POA: Insufficient documentation

## 2021-07-19 DIAGNOSIS — R471 Dysarthria and anarthria: Secondary | ICD-10-CM | POA: Diagnosis not present

## 2021-07-19 DIAGNOSIS — I639 Cerebral infarction, unspecified: Secondary | ICD-10-CM | POA: Insufficient documentation

## 2021-07-19 DIAGNOSIS — R519 Headache, unspecified: Secondary | ICD-10-CM | POA: Diagnosis present

## 2021-07-19 DIAGNOSIS — E119 Type 2 diabetes mellitus without complications: Secondary | ICD-10-CM | POA: Diagnosis not present

## 2021-07-19 DIAGNOSIS — Z79899 Other long term (current) drug therapy: Secondary | ICD-10-CM | POA: Insufficient documentation

## 2021-07-19 DIAGNOSIS — R42 Dizziness and giddiness: Secondary | ICD-10-CM

## 2021-07-19 LAB — COMPREHENSIVE METABOLIC PANEL
ALT: 19 U/L (ref 0–44)
AST: 22 U/L (ref 15–41)
Albumin: 3.6 g/dL (ref 3.5–5.0)
Alkaline Phosphatase: 60 U/L (ref 38–126)
Anion gap: 6 (ref 5–15)
BUN: 10 mg/dL (ref 8–23)
CO2: 27 mmol/L (ref 22–32)
Calcium: 8.8 mg/dL — ABNORMAL LOW (ref 8.9–10.3)
Chloride: 107 mmol/L (ref 98–111)
Creatinine, Ser: 0.61 mg/dL (ref 0.44–1.00)
GFR, Estimated: >60 mL/min (ref >60)
Glucose, Bld: 148 mg/dL — ABNORMAL HIGH (ref 70–99)
Potassium: 4.3 mmol/L (ref 3.5–5.1)
Sodium: 140 mmol/L (ref 135–145)
Total Bilirubin: 0.4 mg/dL (ref 0.3–1.2)
Total Protein: 7.2 g/dL (ref 6.5–8.1)

## 2021-07-19 LAB — DIFFERENTIAL
Abs Immature Granulocytes: 0.03 10*3/uL (ref 0.00–0.07)
Basophils Absolute: 0 10*3/uL (ref 0.0–0.1)
Basophils Relative: 1 %
Eosinophils Absolute: 0.2 10*3/uL (ref 0.0–0.5)
Eosinophils Relative: 3 %
Immature Granulocytes: 1 %
Lymphocytes Relative: 39 %
Lymphs Abs: 2.3 10*3/uL (ref 0.7–4.0)
Monocytes Absolute: 0.7 10*3/uL (ref 0.1–1.0)
Monocytes Relative: 12 %
Neutro Abs: 2.7 10*3/uL (ref 1.7–7.7)
Neutrophils Relative %: 44 %

## 2021-07-19 LAB — CBC
HCT: 35.3 % — ABNORMAL LOW (ref 36.0–46.0)
Hemoglobin: 11.4 g/dL — ABNORMAL LOW (ref 12.0–15.0)
MCH: 31.7 pg (ref 26.0–34.0)
MCHC: 32.3 g/dL (ref 30.0–36.0)
MCV: 98.1 fL (ref 80.0–100.0)
Platelets: 195 10*3/uL (ref 150–400)
RBC: 3.6 MIL/uL — ABNORMAL LOW (ref 3.87–5.11)
RDW: 12.1 % (ref 11.5–15.5)
WBC: 5.9 10*3/uL (ref 4.0–10.5)
nRBC: 0 % (ref 0.0–0.2)

## 2021-07-19 LAB — ETHANOL: Alcohol, Ethyl (B): <10 mg/dL (ref <10)

## 2021-07-19 LAB — PROTIME-INR
INR: 1 (ref 0.8–1.2)
Prothrombin Time: 13.4 seconds (ref 11.4–15.2)

## 2021-07-19 LAB — RESP PANEL BY RT-PCR (FLU A&B, COVID) ARPGX2
Influenza A by PCR: NEGATIVE
Influenza B by PCR: NEGATIVE
SARS Coronavirus 2 by RT PCR: NEGATIVE

## 2021-07-19 LAB — SEDIMENTATION RATE: Sed Rate: 47 mm/hr — ABNORMAL HIGH (ref 0–22)

## 2021-07-19 LAB — APTT: aPTT: 30 seconds (ref 24–36)

## 2021-07-19 NOTE — ED Notes (Signed)
Monitor indicates respirations at 9 per minute. Patient is sleeping and monitor cannot detect all respirations.  Actual respirations are 16 per minute

## 2021-07-19 NOTE — ED Notes (Signed)
Patient ambulated in hall with assistance.  Patient normally uses walker.   Patient has a fear of falling which causes her to hesitate when attempting to walk.

## 2021-07-19 NOTE — ED Provider Notes (Signed)
7:30 AM-awaiting arrival of son to help to determine her baseline and current status.   10:45 AM-patient's son is here now and states he feels like she is at her baseline.  She has not followed up with her PCP, since hospital discharge.  Patient's headache has resolved.  Her vital signs have improved.  Patient feels like she is ready to go home.  She has been ambulating with both a cane and walker at home.  Patient improved after fall.  Patient's son states that she was with another family member, walk to the bathroom where she fell.  She was at home where she usually does not stay.  Patient's son states that someone can be with her all of the time.  They both understand that she will need to follow-up with her PCP since she had the fall.  Patient was given fall precautions and instructions to continue current at-home treatment plan.  She was discharged with son.   Mancel Bale, MD 07/19/21 1055

## 2021-07-19 NOTE — ED Provider Notes (Signed)
[facility] EMERGENCY DEPARTMENT Provider Note   CSN: [id number] Arrival date & time: 07/19/21  6578     History Chief Complaint  Patient presents with   Headache    [name] [name] is a 87 y.o. female.  The history is provided by the patient.  Headache Pain location:  Generalized Onset quality:  Gradual Timing:  Constant Progression:  Worsening Chronicity:  New Relieved by:  None tried Worsened by:  Nothing Associated symptoms: dizziness   Associated symptoms: no abdominal pain and no fever   Patient with extensive history including recent stroke, diabetes, CAD presents after a fall and headache.  Patient reports she was getting up when she felt dizzy and fell.  She reports she has had a headache since that time.  Denies loss of consciousness.  No fevers or vomiting.  No chest pain or shortness of breath.  She reports generalized weakness but no focal weakness. Denies any traumatic injury from the fall    Past Medical History:  Diagnosis Date   Ankle edema    Arthritis    right hip   Bronchitis    in Dec   CAD (coronary artery disease)    a. s/p DES to LAD in 2008, NST in 2015 and 2017 which were low-risk   Carotid stenosis    mild   Cataract    left;not ready to come off   Constipation    takes OTC stool softener as needed   Diabetes mellitus    pt doesn't take any meds for it   DJD (degenerative joint disease) of hip    right   Dry cough    GERD (gastroesophageal reflux disease)    Headache(784.0)    occasionally   HTN (hypertension)    takes Amlodpine and Carvedilol daily   Hx of cardiovascular stress test    Lexiscan Myoview (04/2014):  No ischemia, EF 64%, Low Risk   Hypercholesteremia    takes Crestor daily   Hypothyroidism    takes Synthroid daily   Ileus, postoperative (HCC) 05/12/2014   Myocardial infarction [facility])    pt unsure but thinks about 47yrs ago;is taking Plavix daily    Nocturia    Osteoarthritis of left hip 05/09/2014   Primary  osteoarthritis of right hip 10/27/2011    Patient Active Problem List   Diagnosis Date Noted   Acute ischemic stroke (HCC) 06/17/2021   Transient ischemic attack 06/16/2021   Slurred speech 06/16/2021   Headache 06/16/2021   Hyperglycemia 06/16/2021   Hypothyroidism 06/16/2021   Ischemic cardiomyopathy 08/24/2018   Ectopic atrial rhythm 08/24/2018   Ileus, postoperative (HCC) 05/12/2014   Osteoarthritis of left hip 05/09/2014   Hip arthritis 05/09/2014   Primary osteoarthritis of right hip 10/27/2011   Hip pain 08/24/2011   CHRONIC DIASTOLIC HEART FAILURE 12/24/2009   SHORTNESS OF BREATH 11/29/2009   Hyperlipidemia 09/17/2008   Essential hypertension 09/17/2008   Coronary artery disease 09/17/2008   CAROTID ARTERY STENOSIS 09/17/2008    Past Surgical History:  Procedure Laterality Date   ABDOMINAL HYSTERECTOMY  20+yrs   BACK SURGERY  15+yrs ago   CARDIAC CATHETERIZATION  2008   CORONARY ARTERY BYPASS GRAFT     LYMPH NODE DISSECTION  20+yrs ago   right    TOTAL HIP ARTHROPLASTY  10/27/2011   Procedure: TOTAL HIP ARTHROPLASTY;  Surgeon: Eulas Post, MD;  Location: [facility] OR;  Service: Orthopedics;  Laterality: Right;   TOTAL HIP ARTHROPLASTY Left 05/09/2014   Procedure: LEFT TOTAL HIP  ARTHROPLASTY;  Surgeon: Eulas Post, MD;  Location: [facility] OR;  Service: Orthopedics;  Laterality: Left;     OB History   No obstetric history on file.     Family History  Problem Relation Age of Onset   Hypertension Mother    Stroke Mother    Hypertension Father    Heart attack Brother    Heart attack Sister    Hypertension Sister    Hypertension Brother    Hypertension Daughter    Hypertension Son    Hyperlipidemia Sister    Diabetes Brother    Anesthesia problems Neg Hx    Hypotension Neg Hx    Malignant hyperthermia Neg Hx    Pseudochol deficiency Neg Hx     Social History   Tobacco Use   Smoking status: Never   Smokeless tobacco: Never  Vaping Use   Vaping Use: Never  used  Substance Use Topics   Alcohol use: No   Drug use: No    Home Medications Prior to Admission medications   Medication Sig Start Date End Date Taking? Authorizing Provider  amLODipine (NORVASC) 10 MG tablet Take 1 tablet (10 mg total) by mouth daily. 09/28/19   Netta Neat., NP  aspirin 325 MG tablet Take 1 tablet (325 mg total) by mouth daily. 06/19/21   Catarina Hartshorn, MD  clopidogrel (PLAVIX) 75 MG tablet TAKE 1 TABLET BY MOUTH ONCE DAILY. Patient taking differently: Take 75 mg by mouth daily. 10/10/19   Laqueta Linden, MD  enalapril (VASOTEC) 20 MG tablet TAKE 1 TABLET BY MOUTH ONCE DAILY. 03/07/20   Laqueta Linden, MD  ezetimibe (ZETIA) 10 MG tablet TAKE 1 TABLET BY MOUTH ONCE DAILY. Patient taking differently: Take 10 mg by mouth daily. 11/30/19   Laqueta Linden, MD  fluticasone (FLONASE) 50 MCG/ACT nasal spray Place 1 spray into both nostrils daily. Patient taking differently: Place 1 spray into both nostrils daily as needed for allergies or rhinitis. 04/28/20   Maxwell Caul, PA-C  isosorbide mononitrate (IMDUR) 30 MG 24 hr tablet TAKE 1 TABLET BY MOUTH ONCE DAILY. Patient taking differently: Take 30 mg by mouth daily. 03/08/20   Laqueta Linden, MD  levothyroxine (SYNTHROID) 150 MCG tablet Take 150 mcg by mouth daily.    [provider]  nitroGLYCERIN (NITROSTAT) 0.4 MG SL tablet Place 1 tablet (0.4 mg total) under the tongue every 5 (five) minutes as needed for chest pain. 10/25/20 06/16/21  Antoine Poche, MD  rosuvastatin (CRESTOR) 40 MG tablet TAKE 1 TABLET BY MOUTH ONCE DAILY. Patient taking differently: Take 40 mg by mouth daily. 11/30/19   Laqueta Linden, MD  spironolactone (ALDACTONE) 25 MG tablet TAKE 1 TABLET BY MOUTH ONCE DAILY. Patient taking differently: Take 25 mg by mouth daily. 10/10/19   Laqueta Linden, MD    Allergies    Patient has no allergy information on record.  Review of Systems   Review of Systems   Constitutional:  Negative for fever.  Respiratory:  Negative for shortness of breath.   Cardiovascular:  Negative for chest pain.  Gastrointestinal:  Negative for abdominal pain.  Genitourinary:  Negative for dysuria.  Neurological:  Positive for dizziness, speech difficulty and headaches.       Slurred speech since last month  All other systems reviewed and are negative.  Physical Exam Updated Vital Signs BP 127/68   Pulse (!) 44   Temp 97.7 F (36.5 C) (Oral)   Resp Marland Kitchen)  5   Ht 1.651 m (5\' 5" )   Wt 65.8 kg   SpO2 99%   BMI 24.13 kg/m   Physical Exam CONSTITUTIONAL: Well developed, elderly, no acute distress HEAD: Normocephalic/atraumatic, no visible trauma EYES: EOMI/PERRL, mild horizontal nystagmus ENMT: Mucous membranes moist NECK: supple no meningeal signs SPINE/BACK:entire spine nontender, no bruising/crepitance/stepoffs noted to spine CV: S1/S2 noted, bradycardic LUNGS: Lungs are clear to auscultation bilaterally, no apparent distress ABDOMEN: soft, nontender GU:no cva tenderness NEURO: Pt is awake/alert/appropriate, moves all extremitiesx4.  No arm or leg drift.  Left facial droop noted.  Patient has dysarthria.  She answers all questions appropriately EXTREMITIES: pulses normal/equal, full ROM No deformities All extremities/joints palpated/ranged and nontender SKIN: warm, color normal PSYCH: no abnormalities of mood noted, alert and oriented to situation  ED Results / Procedures / Treatments   Labs (all labs ordered are listed, but only abnormal results are displayed) Labs Reviewed  RESP PANEL BY RT-PCR (FLU A&B, COVID) ARPGX2  ETHANOL  PROTIME-INR  APTT  CBC  DIFFERENTIAL  COMPREHENSIVE METABOLIC PANEL  RAPID URINE DRUG SCREEN, HOSP PERFORMED  URINALYSIS, ROUTINE W REFLEX MICROSCOPIC  SEDIMENTATION RATE    EKG EKG Interpretation  Date/Time:  Friday July 19 2021 06:21:00 EDT Ventricular Rate:  47 PR Interval:  117 QRS Duration: 117 QT  Interval:  494 QTC Calculation: 437 R Axis:   72 Text Interpretation: Ectopic atrial bradycardia Borderline short PR interval Incomplete left bundle branch block Confirmed by 04-03-1989 (Zadie Rhine) on 07/19/2021 6:23:59 AM  Radiology No results found.  Procedures Procedures   Medications Ordered in ED Medications - No data to display  ED Course  I have reviewed the triage vital signs and the nursing notes.  Pertinent labs & imaging results that were available during my care of the patient were reviewed by me and considered in my medical decision making (see chart for details).    MDM Rules/Calculators/A&P                           Patient presents after having dizziness and then falling.  She reports a mild headache. Patient has no signs of acute traumatic injury.  Patient was in hospital last month after being found to have a right frontal stroke with associated dysarthria.  She was discharged home on dual antiplatelet therapy with aspirin and Plavix. Currently patient is awake alert in no acute distress. Of note, she is wearing an event monitor to rule out atrial fibrillation. EKG here reveals ectopic bradycardic rhythm that is regular with an incomplete left bundle branch block Discussed with her son, 07/21/2021, who will come to the ER He was not aware that she was in the hospital.  I was unable to contact anyone else to get collateral information 6:55 AM Patient updated on plan.  CT head official read is pending.  Labs are pending. It is unclear if dysarthria and facial weakness are worsened from prior.  She has no arm or leg weakness.  She is otherwise stable while resting in the bed. Will need family to help corroborate the story once they arrive.  Patient may need to undergo MRI to evaluate for any new stroke.  It is unclear when the last known well occurred 7:07 AM Signed out to Dr. Simona Huh at shift change   Final Clinical Impression(s) / ED Diagnoses Final diagnoses:   Other headache syndrome  Dizziness  Dysarthria    Rx / DC Orders ED Discharge Orders  None        Zadie Rhine, MD 07/19/21 [phone]

## 2021-07-19 NOTE — ED Notes (Signed)
Monitor indicated high b/p.  Re-checked.   B/p 175-80

## 2021-07-19 NOTE — ED Triage Notes (Signed)
Pt arrived via [facility] stating that she had a headache 8/10 along with dizziness et slight sweating.

## 2021-07-19 NOTE — Discharge Instructions (Addendum)
Testing today did not show any problems or change in your status.  Continue to use a cane or walker when you ambulate.  Make sure you are taking your medicines as prescribed.  Get plenty of rest, eat good diet and drink plenty of fluids.  Return here, if needed.

## 2021-07-29 ENCOUNTER — Other Ambulatory Visit: Payer: Self-pay

## 2021-07-29 ENCOUNTER — Ambulatory Visit (INDEPENDENT_AMBULATORY_CARE_PROVIDER_SITE_OTHER): Payer: Medicare Other

## 2021-07-29 ENCOUNTER — Telehealth: Payer: Self-pay | Admitting: Cardiology

## 2021-07-29 DIAGNOSIS — I639 Cerebral infarction, unspecified: Secondary | ICD-10-CM

## 2021-07-29 NOTE — Telephone Encounter (Signed)
PERCERT FOR:   EVENT MONITOR CUPID  30 DAY

## 2021-07-31 ENCOUNTER — Encounter: Payer: Self-pay | Admitting: Cardiology

## 2021-08-14 ENCOUNTER — Encounter: Payer: Self-pay | Admitting: Cardiology

## 2021-08-14 ENCOUNTER — Ambulatory Visit: Payer: Medicaid Other | Admitting: Cardiology

## 2021-08-14 NOTE — Progress Notes (Deleted)
Clinical Summary [name] [name] is a 87 y.o.female former patient of Dr Purvis Sheffield, this is our first visit together.  CAD - s/p DES to LAD in 2008, NST in 2015 and 2017 which were low-risk - prior LVEF dysfunction that normalized -  has been on DAPT given residual disease by prior cath and recommended by Dr. Okey Dupre to continue indefinitely by review of prior notes. ASA increased to 325mg  daily by neuro during 06/2021 admission with CVA.   2. HTN   3. Hyperlipidemia   4. CVA - 06/2021 admit with CVA - from cardiac standpoint she had outpatient monitor did not show afib or aflutter, though data only available from 20% of planned monitored time - ASA increased to 325mg  taht admit, continued on plavix.    Past Medical History:  Diagnosis Date   Ankle edema    Arthritis    right hip   Bronchitis    in Dec   CAD (coronary artery disease)    a. s/p DES to LAD in 2008, NST in 2015 and 2017 which were low-risk   Carotid stenosis    mild   Cataract    left;not ready to come off   Constipation    takes OTC stool softener as needed   Diabetes mellitus    pt doesn't take any meds for it   DJD (degenerative joint disease) of hip    right   Dry cough    GERD (gastroesophageal reflux disease)    Headache(784.0)    occasionally   HTN (hypertension)    takes Amlodpine and Carvedilol daily   Hx of cardiovascular stress test    Lexiscan Myoview (04/2014):  No ischemia, EF 64%, Low Risk   Hypercholesteremia    takes Crestor daily   Hypothyroidism    takes Synthroid daily   Ileus, postoperative (HCC) 05/12/2014   Myocardial infarction [facility])    pt unsure but thinks about 60yrs ago;is taking Plavix daily    Nocturia    Osteoarthritis of left hip 05/09/2014   Primary osteoarthritis of right hip 10/27/2011     Not on File   Current Outpatient Medications  Medication Sig Dispense Refill   amLODipine (NORVASC) 10 MG tablet Take 1 tablet (10 mg total) by mouth daily. 90 tablet 3    aspirin 325 MG tablet Take 1 tablet (325 mg total) by mouth daily.     clopidogrel (PLAVIX) 75 MG tablet TAKE 1 TABLET BY MOUTH ONCE DAILY. (Patient taking differently: Take 75 mg by mouth daily.) 90 tablet 1   enalapril (VASOTEC) 20 MG tablet TAKE 1 TABLET BY MOUTH ONCE DAILY. 30 tablet 0   ezetimibe (ZETIA) 10 MG tablet TAKE 1 TABLET BY MOUTH ONCE DAILY. (Patient taking differently: Take 10 mg by mouth daily.) 30 tablet 11   fluticasone (FLONASE) 50 MCG/ACT nasal spray Place 1 spray into both nostrils daily. (Patient taking differently: Place 1 spray into both nostrils daily as needed for allergies or rhinitis.) 16 g 2   isosorbide mononitrate (IMDUR) 30 MG 24 hr tablet TAKE 1 TABLET BY MOUTH ONCE DAILY. (Patient taking differently: Take 30 mg by mouth daily.) 30 tablet 11   levothyroxine (SYNTHROID) 150 MCG tablet Take 150 mcg by mouth daily.     nitroGLYCERIN (NITROSTAT) 0.4 MG SL tablet Place 1 tablet (0.4 mg total) under the tongue every 5 (five) minutes as needed for chest pain. 25 tablet 3   rosuvastatin (CRESTOR) 40 MG tablet TAKE 1 TABLET BY MOUTH ONCE  DAILY. (Patient taking differently: Take 40 mg by mouth daily.) 30 tablet 11   spironolactone (ALDACTONE) 25 MG tablet TAKE 1 TABLET BY MOUTH ONCE DAILY. (Patient taking differently: Take 25 mg by mouth daily.) 90 tablet 1   No current facility-administered medications for this visit.     Past Surgical History:  Procedure Laterality Date   ABDOMINAL HYSTERECTOMY  20+yrs   BACK SURGERY  15+yrs ago   CARDIAC CATHETERIZATION  2008   CORONARY ARTERY BYPASS GRAFT     LYMPH NODE DISSECTION  20+yrs ago   right    TOTAL HIP ARTHROPLASTY  10/27/2011   Procedure: TOTAL HIP ARTHROPLASTY;  Surgeon: Eulas Post, MD;  Location: [facility] OR;  Service: Orthopedics;  Laterality: Right;   TOTAL HIP ARTHROPLASTY Left 05/09/2014   Procedure: LEFT TOTAL HIP ARTHROPLASTY;  Surgeon: Eulas Post, MD;  Location: [facility] OR;  Service: Orthopedics;  Laterality:  Left;     Not on File    Family History  Problem Relation Age of Onset   Hypertension Mother    Stroke Mother    Hypertension Father    Heart attack Brother    Heart attack Sister    Hypertension Sister    Hypertension Brother    Hypertension Daughter    Hypertension Son    Hyperlipidemia Sister    Diabetes Brother    Anesthesia problems Neg Hx    Hypotension Neg Hx    Malignant hyperthermia Neg Hx    Pseudochol deficiency Neg Hx      Social History Ms. Loma reports that she has never smoked. She has never used smokeless tobacco. Ms. Laperle reports no history of alcohol use.   Review of Systems CONSTITUTIONAL: No weight loss, fever, chills, weakness or fatigue.  HEENT: Eyes: No visual loss, blurred vision, double vision or yellow sclerae.No hearing loss, sneezing, congestion, runny nose or sore throat.  SKIN: No rash or itching.  CARDIOVASCULAR:  RESPIRATORY: No shortness of breath, cough or sputum.  GASTROINTESTINAL: No anorexia, nausea, vomiting or diarrhea. No abdominal pain or blood.  GENITOURINARY: No burning on urination, no polyuria NEUROLOGICAL: No headache, dizziness, syncope, paralysis, ataxia, numbness or tingling in the extremities. No change in bowel or bladder control.  MUSCULOSKELETAL: No muscle, back pain, joint pain or stiffness.  LYMPHATICS: No enlarged nodes. No history of splenectomy.  PSYCHIATRIC: No history of depression or anxiety.  ENDOCRINOLOGIC: No reports of sweating, cold or heat intolerance. No polyuria or polydipsia.  Marland Kitchen   Physical Examination There were no vitals filed for this visit. There were no vitals filed for this visit.  Gen: resting comfortably, no acute distress HEENT: no scleral icterus, pupils equal round and reactive, no palptable cervical adenopathy,  CV Resp: Clear to auscultation bilaterally GI: abdomen is soft, non-tender, non-distended, normal bowel sounds, no hepatosplenomegaly MSK: extremities are warm, no  edema.  Skin: warm, no rash Neuro:  no focal deficits Psych: appropriate affect   Diagnostic Studies Echocardiogram: 01/2016 Study Conclusions   - Left ventricle: The cavity size was normal. There was mild focal    basal hypertrophy of the septum. Systolic function was normal.    The estimated ejection fraction was in the range of 60% to 65%.    Wall motion was normal; there were no regional wall motion    abnormalities. There was an increased relative contribution of    atrial contraction to ventricular filling. Doppler parameters are    consistent with abnormal left ventricular relaxation (grade 1  diastolic dysfunction). Doppler parameters are consistent with    high ventricular filling pressure.  - Mitral valve: Minimal focal calcification of the anterior leaflet    (medial segment(s)). There was trivial regurgitation.  - Pulmonic valve: There was no regurgitation.      NST: 01/2016 Nuclear stress EF: 69%. This is a low risk study. The left ventricular ejection fraction is hyperdynamic (>65%). There was no ST segment deviation noted during stress. The study is normal.   Small region of mid anteroseptal thinning that improves consistent with samll region of ischemia; cannot exclude shifting soft tissue  Mild apical thinning that improves but does not appear significant for ischemia  LVEF normal  Ovreall low risk scan      Assessment and Plan  1. CAD - She is s/p DES to LAD in 2008 with most recent ischemic evaluation being a low-risk NST in 01/2016. She denies any recent chest pain or dyspnea on exertion.  - Continue current medication regimen with ASA 81mg  daily, Plavix 75mg  daily (has been on DAPT given residual disease by prior cath and recommended by Dr. to continue indefinitely by review of prior notes), Imdur 30mg  daily and Crestor 40mg  daily.    2. HTN - She did not have a way of checking her blood pressure at home today but says it has overall been well  controlled at recent office visits. Will continue her current medication regimen with Amlodipine 10 mg daily, Enalapril 20 mg daily, Imdur 30 mg daily and Spironolactone 25 mg daily.   3. HLD - Will request a copy of most recent labs from her PCP. She remains on Zetia 10mg  daily and Crestor 40mg  daily with goal LDL less than 70 given her known CAD.       Okey Dupre, M.D.

## 2021-08-15 ENCOUNTER — Ambulatory Visit: Payer: Medicare HMO | Admitting: Cardiology

## 2021-09-11 ENCOUNTER — Telehealth: Payer: Self-pay

## 2021-09-11 NOTE — Telephone Encounter (Signed)
Attempted to call pt x3 without success. Letter mailed

## 2021-09-11 NOTE — Telephone Encounter (Signed)
-----   Message from Antoine Poche, MD sent at 09/02/2021  9:16 AM EST ----- Limited data from monitor, only 20% of planned monitored time. Did she have troubles with the monitor? No rhythms were noted that would contribute to her stroke.   Dominga Ferry MD

## 2022-01-31 ENCOUNTER — Encounter: Payer: Self-pay | Admitting: Student

## 2022-01-31 ENCOUNTER — Ambulatory Visit (INDEPENDENT_AMBULATORY_CARE_PROVIDER_SITE_OTHER): Payer: Medicare Other | Admitting: Student

## 2022-01-31 ENCOUNTER — Encounter: Payer: Self-pay | Admitting: *Deleted

## 2022-01-31 VITALS — BP 122/60 | HR 80 | Ht 61.0 in | Wt 147.0 lb

## 2022-01-31 DIAGNOSIS — I5032 Chronic diastolic (congestive) heart failure: Secondary | ICD-10-CM

## 2022-01-31 DIAGNOSIS — E785 Hyperlipidemia, unspecified: Secondary | ICD-10-CM

## 2022-01-31 DIAGNOSIS — I2510 Atherosclerotic heart disease of native coronary artery without angina pectoris: Secondary | ICD-10-CM | POA: Diagnosis not present

## 2022-01-31 DIAGNOSIS — I10 Essential (primary) hypertension: Secondary | ICD-10-CM

## 2022-01-31 MED ORDER — ASPIRIN EC 81 MG PO TBEC: 81.0000 mg | DELAYED_RELEASE_TABLET | Freq: Every day | ORAL | 11 refills | Status: DC

## 2022-01-31 NOTE — Patient Instructions (Signed)
Medication Instructions:  ?Your physician recommends that you continue on your current medications as directed. Please refer to the Current Medication list given to you today. ? ?*If you need a refill on your cardiac medications before your next appointment, please call your pharmacy* ? ? ?Lab Work: ?NONE  ? ?If you have labs (blood work) drawn today and your tests are completely normal, you will receive your results only by: ?MyChart Message (if you have MyChart) OR ?A paper copy in the mail ?If you have any lab test that is abnormal or we need to change your treatment, we will call you to review the results. ? ? ?Testing/Procedures: ?NONE  ? ? ?Follow-Up: ?At [facility], you and your health needs are our priority.  As part of our continuing mission to provide you with exceptional heart care, we have created designated Provider Care Teams.  These Care Teams include your primary Cardiologist (physician) and Advanced Practice Providers (APPs -  Physician Assistants and Nurse Practitioners) who all work together to provide you with the care you need, when you need it. ? ?We recommend signing up for the patient portal called "MyChart".  Sign up information is provided on this After Visit Summary.  MyChart is used to connect with patients for Virtual Visits (Telemedicine).  Patients are able to view lab/test results, encounter notes, upcoming appointments, etc.  Non-urgent messages can be sent to your provider as well.   ?To learn more about what you can do with MyChart, go to [email or web address].   ? ?Your next appointment:   ?6 month(s) ? ?The format for your next appointment:   ?In Person ? ?Provider:   ?Randall An, PA-C or With MD    ? ? ?Other Instructions ?Thank you for choosing [facility]! ? ? ? ?Important Information About Sugar ? ? ? ? ? ? ?

## 2022-01-31 NOTE — Progress Notes (Addendum)
? ?Cardiology Office Note   ? ?Date:  01/31/2022  ? ?ID:  [name] [name], DOB Jun 17, 1934, MRN [id number] ? ?PCP:  Ludwig Clarks, FNP  ?Cardiologist:Previously Dr. [name] Revel but has requested to follow-up in [facility] ? ?Chief Complaint  ?Patient presents with  ? Follow-up  ?  Overdue Visit  ? ? ?History of Present Illness:   ? ?[name] [name] is a 88 y.o. female with past medical history of CAD (s/p DES to LAD in 2008, NST's in 2015 and 2017 which were low-risk), HFimpEF, HTN and HLD who presents to the office today for overdue follow-up. ? ?She most recently had a telehealth visit with myself in 11/2020 and denied any recent anginal symptoms at that time. Was continued on her current cardiac medications including ASA 81 mg daily, Plavix 75 mg daily, Imdur 30 mg daily, Amlodipine 10 mg daily, Enalapril 20 mg daily, Spironolactone 25 mg daily and Crestor 40 mg daily. She was not on a beta-blocker given prior bradycardia and she was continued on DAPT as it had previously been recommended by Dr. [name] Revel to continue indefinite DAPT given moderate to severe disease along other branches. ? ?In the interim, she was admitted for an acute ischemic stroke in 06/2021 with Brain MRI showing a small acute infarction of the right frontal lobe with some involvement of the precentral gyrus. Carotid Dopplers were performed and showed no hemodynamically significant stenosis. Echocardiogram showed a preserved EF of 70 to 75% with grade 1 diastolic dysfunction. RV function was normal and she did have mild MR. A 30-day event monitor was recommended that she only wore this for 20% of the planned time (was at [facility]). This did not show any atrial fibrillation or flutter but she was listed as having 2 episodes of NSVT with the longest being 7.6 seconds for 14 beats. It appears she did have a second episode but was felt to be most consistent with idioventricular rhythm.  ? ?In talking with the patient and her daughter today, she reports doing  well from a cardiac perspective since her last office visit. Her activity has been limited since her prior stroke and she is mostly wheelchair-bound at baseline but does stand for transfers. She denies any recent chest pain or palpitations. No recent orthopnea, PND or pitting edema. ? ? ?Past Medical History:  ?Diagnosis Date  ? Ankle edema   ? Arthritis   ? right hip  ? Bronchitis   ? in Dec  ? CAD (coronary artery disease)   ? a. s/p DES to LAD in 2008, NST in 2015 and 2017 which were low-risk  ? Carotid stenosis   ? mild  ? Cataract   ? left;not ready to come off  ? Constipation   ? takes OTC stool softener as needed  ? Diabetes mellitus   ? pt doesn't take any meds for it  ? DJD (degenerative joint disease) of hip   ? right  ? Dry cough   ? GERD (gastroesophageal reflux disease)   ? Headache(784.0)   ? occasionally  ? HTN (hypertension)   ? takes Amlodpine and Carvedilol daily  ? Hx of cardiovascular stress test   ? Lexiscan Myoview (04/2014):  No ischemia, EF 64%, Low Risk  ? Hypercholesteremia   ? takes Crestor daily  ? Hypothyroidism   ? takes Synthroid daily  ? Ileus, postoperative ([place]) 05/12/2014  ? Myocardial infarction [facility])   ? pt unsure but thinks about 56yrs ago;is taking Plavix daily   ?  Nocturia   ? Osteoarthritis of left hip 05/09/2014  ? Primary osteoarthritis of right hip 10/27/2011  ? ? ?Past Surgical History:  ?Procedure Laterality Date  ? ABDOMINAL HYSTERECTOMY  20+yrs  ? BACK SURGERY  15+yrs ago  ? CARDIAC CATHETERIZATION  2008  ? CORONARY ARTERY BYPASS GRAFT    ? LYMPH NODE DISSECTION  20+yrs ago  ? right   ? TOTAL HIP ARTHROPLASTY  10/27/2011  ? Procedure: TOTAL HIP ARTHROPLASTY;  Surgeon: Johnny Bridge, MD;  Location: [place];  Service: Orthopedics;  Laterality: Right;  ? TOTAL HIP ARTHROPLASTY Left 05/09/2014  ? Procedure: LEFT TOTAL HIP ARTHROPLASTY;  Surgeon: Johnny Bridge, MD;  Location: [place];  Service: Orthopedics;  Laterality: Left;  ? ? ?Current Medications: ?Outpatient Medications Prior  to Visit  ?Medication Sig Dispense Refill  ? amLODipine (NORVASC) 10 MG tablet Take 1 tablet (10 mg total) by mouth daily. 90 tablet 3  ? amoxicillin-clavulanate (AUGMENTIN) 875-125 MG tablet Take 1 tablet by mouth 2 (two) times daily.    ? clopidogrel (PLAVIX) 75 MG tablet TAKE 1 TABLET BY MOUTH ONCE DAILY. (Patient taking differently: Take 75 mg by mouth daily.) 90 tablet 1  ? enalapril (VASOTEC) 20 MG tablet TAKE 1 TABLET BY MOUTH ONCE DAILY. 30 tablet 0  ? ezetimibe (ZETIA) 10 MG tablet TAKE 1 TABLET BY MOUTH ONCE DAILY. (Patient taking differently: Take 10 mg by mouth daily.) 30 tablet 11  ? fluticasone (FLONASE) 50 MCG/ACT nasal spray Place 1 spray into both nostrils daily. (Patient taking differently: Place 1 spray into both nostrils daily as needed for allergies or rhinitis.) 16 g 2  ? isosorbide mononitrate (IMDUR) 30 MG 24 hr tablet TAKE 1 TABLET BY MOUTH ONCE DAILY. (Patient taking differently: Take 30 mg by mouth daily.) 30 tablet 11  ? levothyroxine (SYNTHROID) 150 MCG tablet Take 150 mcg by mouth daily.    ? nitroGLYCERIN (NITROSTAT) 0.4 MG SL tablet Place 1 tablet (0.4 mg total) under the tongue every 5 (five) minutes as needed for chest pain. 25 tablet 3  ? rosuvastatin (CRESTOR) 40 MG tablet TAKE 1 TABLET BY MOUTH ONCE DAILY. (Patient taking differently: Take 40 mg by mouth daily.) 30 tablet 11  ? spironolactone (ALDACTONE) 25 MG tablet TAKE 1 TABLET BY MOUTH ONCE DAILY. (Patient taking differently: Take 25 mg by mouth daily.) 90 tablet 1  ? aspirin 325 MG tablet Take 1 tablet (325 mg total) by mouth daily.    ? ?No facility-administered medications prior to visit.  ?  ? ?Allergies:   Patient has no allergy information on record.  ? ?Social History  ? ?Socioeconomic History  ? Marital status: Widowed  ?  Spouse name: Not on file  ? Number of children: Not on file  ? Years of education: Not on file  ? Highest education level: Not on file  ?Occupational History  ? Not on file  ?Tobacco Use  ? Smoking  status: Never  ? Smokeless tobacco: Never  ?Vaping Use  ? Vaping Use: Never used  ?Substance and Sexual Activity  ? Alcohol use: No  ? Drug use: No  ? Sexual activity: Never  ?  Birth control/protection: Surgical  ?Other Topics Concern  ? Not on file  ?Social History Narrative  ? Not on file  ? ?Social Determinants of Health  ? ?Financial Resource Strain: Not on file  ?Food Insecurity: Not on file  ?Transportation Needs: Not on file  ?Physical Activity: Not on file  ?Stress: Not  on file  ?Social Connections: Not on file  ?  ? ?Family History:  The patient's family history includes Diabetes in her brother; Heart attack in her brother and sister; Hyperlipidemia in her sister; Hypertension in her brother, daughter, father, mother, sister, and son; Stroke in her mother.  ? ?Review of Systems:   ? ?Please see the history of present illness.    ? ?All other systems reviewed and are otherwise negative except as noted above. ? ? ?Physical Exam:   ? ?VS:  BP 122/60   Pulse 80   Ht 5\' 1"  (1.549 m)   Wt 147 lb (66.7 kg)   SpO2 98%   BMI 27.78 kg/m?    ?General: Pleasant elderly female appearing in no acute distress. Sitting in wheelchair.  ?Head: Normocephalic, atraumatic. ?Neck: No carotid bruits. JVD not elevated.  ?Lungs: Respirations regular and unlabored, without wheezes or rales.  ?Heart: Regular rate and rhythm. No S3 or S4.  No murmur, no rubs, or gallops appreciated. ?Abdomen: Appears non-distended. No obvious abdominal masses. ?Msk:  Strength and tone appear normal for age. No obvious joint deformities or effusions. ?Extremities: No clubbing or cyanosis. No pitting edema.  Distal pedal pulses are 2+ bilaterally. ?Neuro: Alert and oriented X 3. Residual left sided weakness.  ?Psych:  Responds to questions appropriately with a normal affect. ?Skin: No rashes or lesions noted ? ?Wt Readings from Last 3 Encounters:  ?01/31/22 147 lb (66.7 kg)  ?07/19/21 145 lb (65.8 kg)  ?06/16/21 145 lb (65.8 kg)  ?   ? ?Studies/Labs Reviewed:  ? ?EKG:  EKG is not ordered today.  ? ?Recent Labs: ?06/18/2021: Magnesium 2.1 ?07/19/2021: ALT 19; BUN 10; Creatinine, Ser 0.61; Hemoglobin 11.4; Platelets 195; Potassium 4.3; Sodium 140  ? ?Lipid

## 2022-02-26 ENCOUNTER — Telehealth: Payer: Self-pay | Admitting: Nurse Practitioner

## 2022-02-26 NOTE — Telephone Encounter (Signed)
Rec'd return call from daughter Clarise Cruz and we discussed the Palliative referral/services and all questions were answered and she was in agreement with scheduling visit.  I have scheduled a Telehealth Consult for 02/27/22 @ 2 PM

## 2022-02-26 NOTE — Telephone Encounter (Signed)
Spoke with patient's son [name] [name] and told him that I was calling from [place] and was needing to schedule an appointment with patient.  Son requested I contact his sister [name] [name] (who lives with patient).  I attempted to contact patient's daughter [name] [name] to offer to schedule a Palliative Consult, no answer and unable to leave a message due to mailbox was full.

## 2022-02-27 ENCOUNTER — Telehealth: Payer: Self-pay | Admitting: Nurse Practitioner

## 2022-02-27 ENCOUNTER — Other Ambulatory Visit: Payer: Medicare Other | Admitting: Nurse Practitioner

## 2022-02-27 NOTE — Telephone Encounter (Signed)
I called Huntley Dec, Ms. Kaczynski's daughter for initial pc visit, telemedicine, telephonic as not video available, no answer, unable to leave a message, will re-contact to attempt to reschedule.

## 2022-02-28 ENCOUNTER — Telehealth: Payer: Self-pay | Admitting: Nurse Practitioner

## 2022-02-28 NOTE — Telephone Encounter (Signed)
Attempted to contact daughter Huntley Dec to reschedule Palliative Consult, no answer and unable to leave message due to mailbox was full.

## 2022-03-05 ENCOUNTER — Telehealth: Payer: Self-pay | Admitting: Nurse Practitioner

## 2022-03-07 ENCOUNTER — Telehealth: Payer: Self-pay | Admitting: Nurse Practitioner

## 2022-03-07 NOTE — Telephone Encounter (Signed)
Rec'd call from patient's daughter Huntley Dec and we have rescheduled the Palliative Consult to 03/18/22 @ 2:30 PM

## 2022-03-07 NOTE — Telephone Encounter (Signed)
Attempted to contact patient's daughter Clarise Cruz to reschedule Consult, it rang once and went straight to voicemail and I was unable to leave a message due to mailbox full.  I will try 1 more time to reach daughter to reschedule and if I can't get in touch with her then I will cancel referral.

## 2022-03-07 NOTE — Telephone Encounter (Signed)
Sent daughter Huntley Dec a text asking her to call me back by the end of the day so that we could reschedule the Palliative COnsult, and that if I did not receive a call back from her that I would need to cancel the referral.

## 2022-03-13 ENCOUNTER — Telehealth: Payer: Self-pay

## 2022-03-13 NOTE — Telephone Encounter (Signed)
936 am.  Incoming call from Olivia Canter confirming appointment with Natalia Leatherwood, NP on 03/18/22.

## 2022-03-18 ENCOUNTER — Other Ambulatory Visit: Payer: Medicare Other | Admitting: Nurse Practitioner

## 2022-03-21 ENCOUNTER — Telehealth: Payer: Self-pay | Admitting: Nurse Practitioner

## 2022-03-21 NOTE — Telephone Encounter (Signed)
Attempted to contact patient's daughter Julyssa Kyer to reschedule the Palliative Consult, (patient has been a No Show X2) and I am unable to reach the daughter to Reschedule the Consult.  Palliative referral will be cancelled at this time

## 2022-03-30 IMAGING — CT CT HEAD W/O CM
3 series · 14 of 47 positions shown, 16 images · non-contrast
Comparison: Head CT 06/16/2021 and MRI 06/17/2021

CLINICAL DATA: Headache and dizziness.

EXAM:
CT HEAD WITHOUT CONTRAST
TECHNIQUE: Contiguous axial images were obtained from the base of the skull
through the vertex without intravenous contrast.

[Series 2: head w o · axial · 0.43mm/px · z∈[-8,+117]mm · 8 of 30 slices shown, 10 images]
[im 3/30  brain]
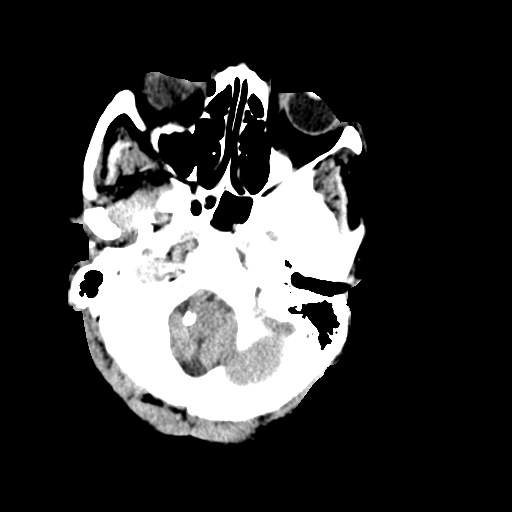
[im 3/30  bone]
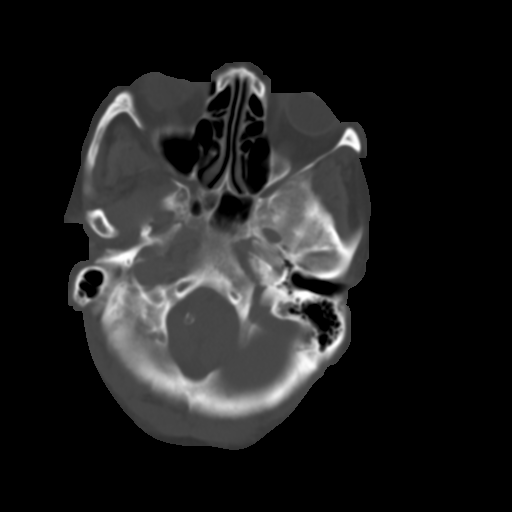
[im 7/30  brain]
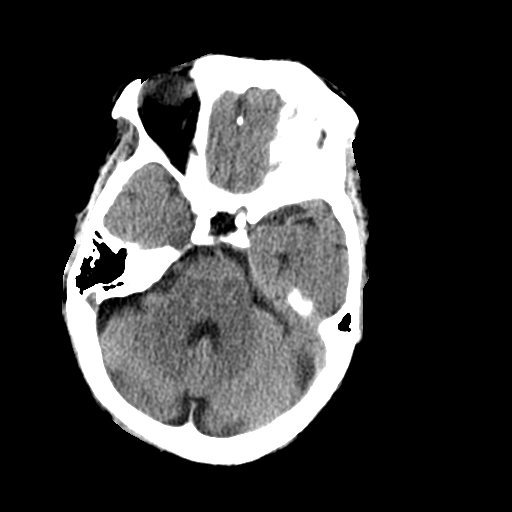
[im 10/30  brain]
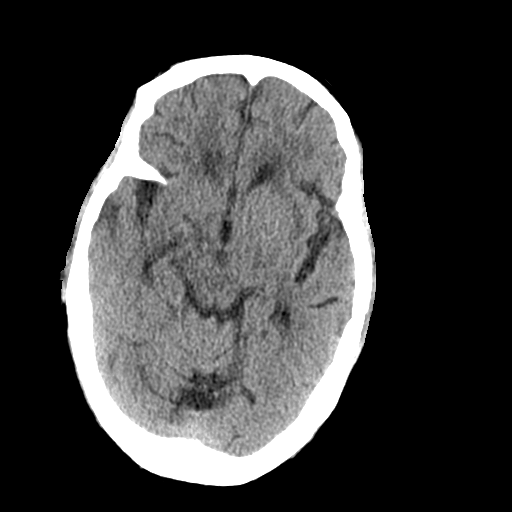
[im 14/30  brain]
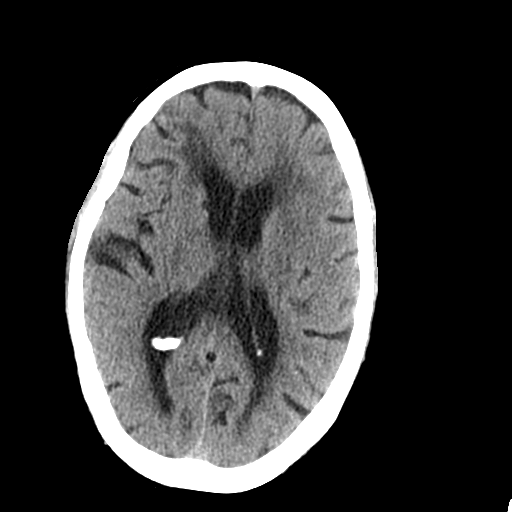
[im 17/30  brain]
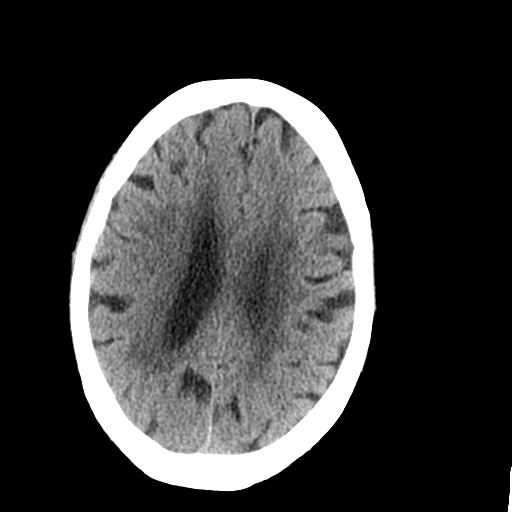
[im 17/30  bone]
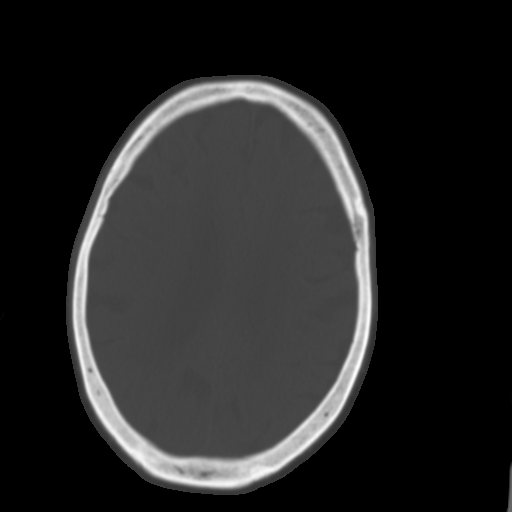
[im 21/30  brain]
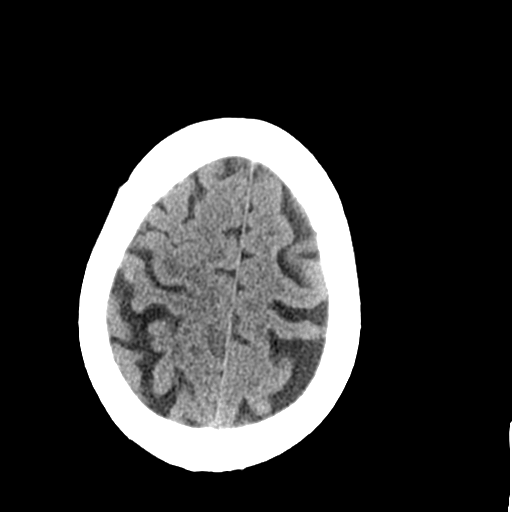
[im 24/30  brain]
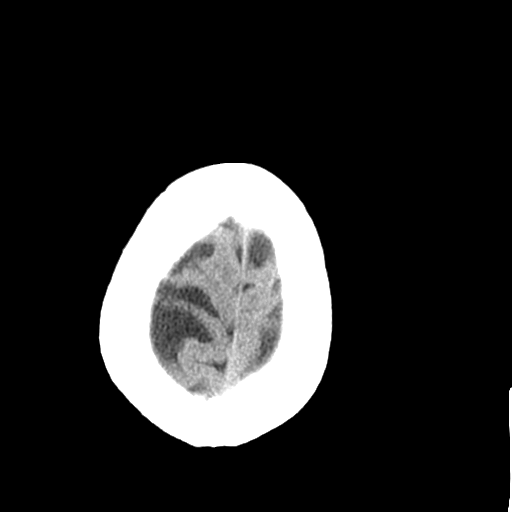
[im 28/30  brain]
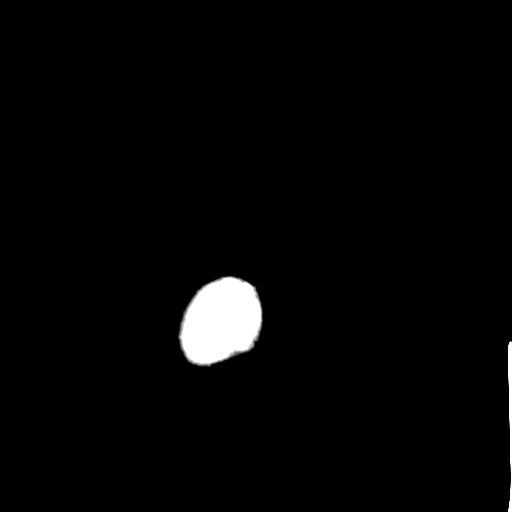

[Series 4: coronal soft · coronal · 0.29mm/px · 3 of 67 slices shown]
[im 23/67  brain]
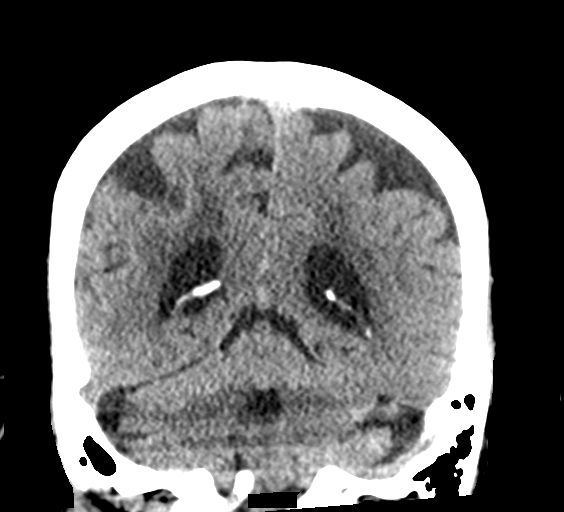
[im 30/67  brain]
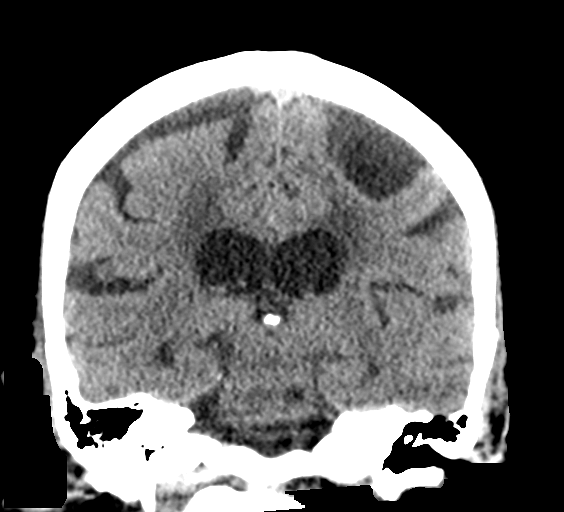
[im 37/67  brain]
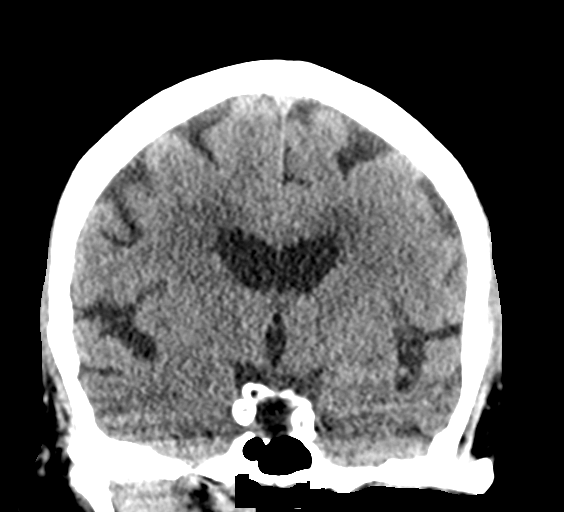

[Series 5: sagittal soft · sagittal · 0.29mm/px · 3 of 51 slices shown]
[im 17/51  brain]
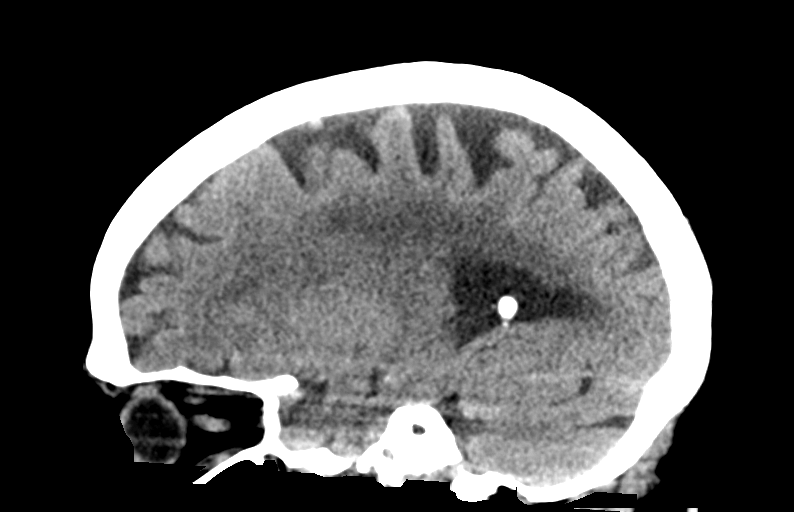
[im 26/51  brain]
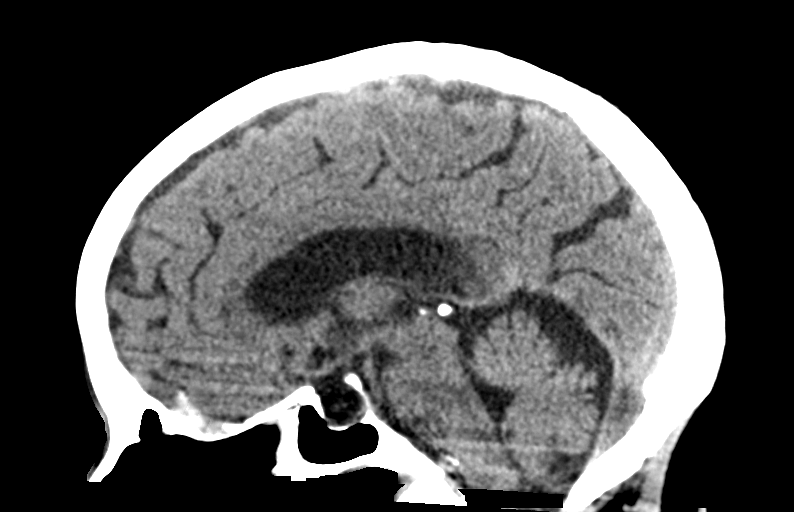
[im 34/51  brain]
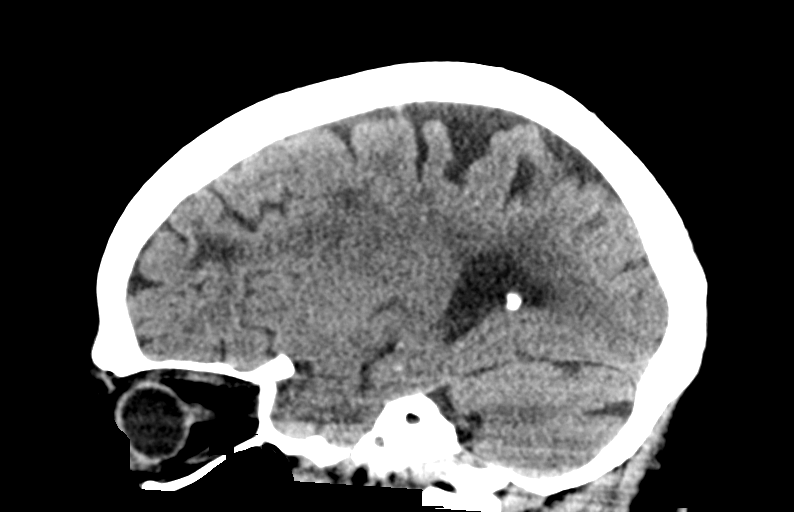

[14 of 47 positions shown; findings below may reference images not displayed]

FINDINGS: Brain: Small focal hypodensity posteriorly in the right frontal lobe
corresponds to the acute infarct on last month's MRI and now has a
late subacute appearance. There is an unchanged small chronic
infarct more anteriorly in the right frontal lobe. Small chronic
infarcts are also again noted in the right caudate nucleus and left
pons. No acute cortically based infarct, intracranial hemorrhage,
mass, midline shift, or extra-axial fluid collection is identified.
Patchy to confluent hypodensities in the cerebral white matter
bilaterally are unchanged and nonspecific but compatible with
moderate chronic small vessel ischemic disease. Small chronic
cerebellar infarcts on MRI are not well shown by CT. There is mild
cerebral atrophy.

Vascular: Calcified atherosclerosis at the skull base. No hyperdense
vessel.

Skull: No fracture or suspicious osseous lesion.

Sinuses/Orbits: Partially visualized small volume fluid in the left
maxillary sinus. Clear mastoid air cells. Unremarkable orbits.

Other: None.
IMPRESSION: 1. No evidence of acute intracranial abnormality.
2. Moderate chronic small vessel ischemic disease. Old infarcts as
above.

## 2022-05-01 ENCOUNTER — Telehealth: Payer: Self-pay | Admitting: Nurse Practitioner

## 2022-05-01 NOTE — Telephone Encounter (Signed)
Attempted to contact daughter Huntley Dec to schedule Palliative Consult, no answer and unable to leave message due to mailbox was full.  I then attempted to contact son with no answer and left a message requesting a return call.

## 2022-05-04 ENCOUNTER — Emergency Department (HOSPITAL_COMMUNITY): Payer: Medicare Other

## 2022-05-04 ENCOUNTER — Other Ambulatory Visit: Payer: Self-pay

## 2022-05-04 ENCOUNTER — Encounter (HOSPITAL_COMMUNITY): Payer: Self-pay

## 2022-05-04 ENCOUNTER — Inpatient Hospital Stay (HOSPITAL_COMMUNITY)
Admission: EM | Admit: 2022-05-04 | Discharge: 2022-05-12 | DRG: 064 | Disposition: A | Discharge status: Skilled Nursing Facility | Payer: Medicare Other | Attending: Family Medicine | Admitting: Family Medicine

## 2022-05-04 DIAGNOSIS — D649 Anemia, unspecified: Secondary | ICD-10-CM | POA: Diagnosis present

## 2022-05-04 DIAGNOSIS — I639 Cerebral infarction, unspecified: Secondary | ICD-10-CM | POA: Diagnosis present

## 2022-05-04 DIAGNOSIS — E7800 Pure hypercholesterolemia, unspecified: Secondary | ICD-10-CM | POA: Diagnosis present

## 2022-05-04 DIAGNOSIS — Z833 Family history of diabetes mellitus: Secondary | ICD-10-CM

## 2022-05-04 DIAGNOSIS — R54 Age-related physical debility: Secondary | ICD-10-CM | POA: Diagnosis present

## 2022-05-04 DIAGNOSIS — I2699 Other pulmonary embolism without acute cor pulmonale: Secondary | ICD-10-CM | POA: Diagnosis present

## 2022-05-04 DIAGNOSIS — Z7982 Long term (current) use of aspirin: Secondary | ICD-10-CM

## 2022-05-04 DIAGNOSIS — R4781 Slurred speech: Secondary | ICD-10-CM | POA: Diagnosis not present

## 2022-05-04 DIAGNOSIS — Z515 Encounter for palliative care: Secondary | ICD-10-CM

## 2022-05-04 DIAGNOSIS — Z955 Presence of coronary angioplasty implant and graft: Secondary | ICD-10-CM

## 2022-05-04 DIAGNOSIS — I110 Hypertensive heart disease with heart failure: Secondary | ICD-10-CM | POA: Diagnosis present

## 2022-05-04 DIAGNOSIS — E876 Hypokalemia: Secondary | ICD-10-CM | POA: Diagnosis present

## 2022-05-04 DIAGNOSIS — K5900 Constipation, unspecified: Secondary | ICD-10-CM | POA: Diagnosis present

## 2022-05-04 DIAGNOSIS — R471 Dysarthria and anarthria: Secondary | ICD-10-CM | POA: Diagnosis present

## 2022-05-04 DIAGNOSIS — I63511 Cerebral infarction due to unspecified occlusion or stenosis of right middle cerebral artery: Principal | ICD-10-CM | POA: Diagnosis present

## 2022-05-04 DIAGNOSIS — I69354 Hemiplegia and hemiparesis following cerebral infarction affecting left non-dominant side: Secondary | ICD-10-CM

## 2022-05-04 DIAGNOSIS — K219 Gastro-esophageal reflux disease without esophagitis: Secondary | ICD-10-CM | POA: Diagnosis present

## 2022-05-04 DIAGNOSIS — I5032 Chronic diastolic (congestive) heart failure: Secondary | ICD-10-CM | POA: Diagnosis not present

## 2022-05-04 DIAGNOSIS — Z8249 Family history of ischemic heart disease and other diseases of the circulatory system: Secondary | ICD-10-CM

## 2022-05-04 DIAGNOSIS — Z66 Do not resuscitate: Secondary | ICD-10-CM | POA: Diagnosis present

## 2022-05-04 DIAGNOSIS — R29810 Facial weakness: Secondary | ICD-10-CM | POA: Diagnosis present

## 2022-05-04 DIAGNOSIS — Z79890 Hormone replacement therapy (postmenopausal): Secondary | ICD-10-CM

## 2022-05-04 DIAGNOSIS — Z951 Presence of aortocoronary bypass graft: Secondary | ICD-10-CM

## 2022-05-04 DIAGNOSIS — I252 Old myocardial infarction: Secondary | ICD-10-CM

## 2022-05-04 DIAGNOSIS — R1310 Dysphagia, unspecified: Secondary | ICD-10-CM | POA: Diagnosis present

## 2022-05-04 DIAGNOSIS — Z7401 Bed confinement status: Secondary | ICD-10-CM

## 2022-05-04 DIAGNOSIS — Z83438 Family history of other disorder of lipoprotein metabolism and other lipidemia: Secondary | ICD-10-CM

## 2022-05-04 DIAGNOSIS — I10 Essential (primary) hypertension: Secondary | ICD-10-CM | POA: Diagnosis not present

## 2022-05-04 DIAGNOSIS — Z96643 Presence of artificial hip joint, bilateral: Secondary | ICD-10-CM | POA: Diagnosis present

## 2022-05-04 DIAGNOSIS — I2510 Atherosclerotic heart disease of native coronary artery without angina pectoris: Secondary | ICD-10-CM | POA: Diagnosis present

## 2022-05-04 DIAGNOSIS — I82411 Acute embolism and thrombosis of right femoral vein: Secondary | ICD-10-CM | POA: Diagnosis present

## 2022-05-04 DIAGNOSIS — Z7902 Long term (current) use of antithrombotics/antiplatelets: Secondary | ICD-10-CM

## 2022-05-04 DIAGNOSIS — Z823 Family history of stroke: Secondary | ICD-10-CM

## 2022-05-04 DIAGNOSIS — R29714 NIHSS score 14: Secondary | ICD-10-CM | POA: Diagnosis present

## 2022-05-04 DIAGNOSIS — D62 Acute posthemorrhagic anemia: Secondary | ICD-10-CM | POA: Diagnosis present

## 2022-05-04 DIAGNOSIS — E119 Type 2 diabetes mellitus without complications: Secondary | ICD-10-CM | POA: Diagnosis present

## 2022-05-04 DIAGNOSIS — E039 Hypothyroidism, unspecified: Secondary | ICD-10-CM | POA: Diagnosis present

## 2022-05-04 DIAGNOSIS — Z79899 Other long term (current) drug therapy: Secondary | ICD-10-CM

## 2022-05-04 LAB — DIFFERENTIAL
Abs Immature Granulocytes: 0.04 10*3/uL (ref 0.00–0.07)
Basophils Absolute: 0.1 10*3/uL (ref 0.0–0.1)
Basophils Relative: 1 %
Eosinophils Absolute: 0 10*3/uL (ref 0.0–0.5)
Eosinophils Relative: 0 %
Immature Granulocytes: 0 %
Lymphocytes Relative: 18 %
Lymphs Abs: 2.1 10*3/uL (ref 0.7–4.0)
Monocytes Absolute: 1 10*3/uL (ref 0.1–1.0)
Monocytes Relative: 8 %
Neutro Abs: 8.1 10*3/uL — ABNORMAL HIGH (ref 1.7–7.7)
Neutrophils Relative %: 73 %

## 2022-05-04 LAB — COMPREHENSIVE METABOLIC PANEL
ALT: 13 U/L (ref 0–44)
AST: 29 U/L (ref 15–41)
Albumin: 2.4 g/dL — ABNORMAL LOW (ref 3.5–5.0)
Alkaline Phosphatase: 107 U/L (ref 38–126)
Anion gap: 7 (ref 5–15)
BUN: 8 mg/dL (ref 8–23)
CO2: 28 mmol/L (ref 22–32)
Calcium: 8.9 mg/dL (ref 8.9–10.3)
Chloride: 106 mmol/L (ref 98–111)
Creatinine, Ser: 0.81 mg/dL (ref 0.44–1.00)
GFR, Estimated: >60 mL/min (ref >60)
Glucose, Bld: 113 mg/dL — ABNORMAL HIGH (ref 70–99)
Potassium: 3.9 mmol/L (ref 3.5–5.1)
Sodium: 141 mmol/L (ref 135–145)
Total Bilirubin: 0.6 mg/dL (ref 0.3–1.2)
Total Protein: 7.7 g/dL (ref 6.5–8.1)

## 2022-05-04 LAB — MAGNESIUM: Magnesium: 1.9 mg/dL (ref 1.7–2.4)

## 2022-05-04 LAB — CBC
HCT: 33.7 % — ABNORMAL LOW (ref 36.0–46.0)
Hemoglobin: 10.9 g/dL — ABNORMAL LOW (ref 12.0–15.0)
MCH: 29.2 pg (ref 26.0–34.0)
MCHC: 32.3 g/dL (ref 30.0–36.0)
MCV: 90.3 fL (ref 80.0–100.0)
Platelets: 250 10*3/uL (ref 150–400)
RBC: 3.73 MIL/uL — ABNORMAL LOW (ref 3.87–5.11)
RDW: 14.4 % (ref 11.5–15.5)
WBC: 11.3 10*3/uL — ABNORMAL HIGH (ref 4.0–10.5)
nRBC: 0 % (ref 0.0–0.2)

## 2022-05-04 LAB — PROTIME-INR
INR: 1.2 (ref 0.8–1.2)
Prothrombin Time: 14.8 seconds (ref 11.4–15.2)

## 2022-05-04 LAB — TROPONIN I (HIGH SENSITIVITY)
Troponin I (High Sensitivity): 17 ng/L (ref <18)
Troponin I (High Sensitivity): 14 ng/L (ref <18)

## 2022-05-04 LAB — APTT: aPTT: 35 seconds (ref 24–36)

## 2022-05-04 MED ORDER — SODIUM CHLORIDE 0.9% FLUSH: 3.0000 mL | Freq: Once | INTRAVENOUS | Status: DC

## 2022-05-04 MED ORDER — HEPARIN (PORCINE) 25000 UT/250ML-% IV SOLN
1000.0000 [IU]/h | INTRAVENOUS | Status: DC
  Administered 2022-05-04: 1000 [IU]/h via INTRAVENOUS
  Filled 2022-05-04: qty 250

## 2022-05-04 MED ORDER — ACETAMINOPHEN 650 MG RE SUPP: 650.0000 mg | RECTAL | Status: DC | PRN

## 2022-05-04 MED ORDER — HEPARIN BOLUS VIA INFUSION
4000.0000 [IU] | Freq: Once | INTRAVENOUS | Status: AC
  Administered 2022-05-04: 4000 [IU] via INTRAVENOUS
  Filled 2022-05-04: qty 4000

## 2022-05-04 MED ORDER — ACETAMINOPHEN 325 MG PO TABS
650.0000 mg | ORAL_TABLET | ORAL | Status: DC | PRN
  Filled 2022-05-04: qty 2

## 2022-05-04 MED ORDER — STROKE: EARLY STAGES OF RECOVERY BOOK
Freq: Once | Status: AC
Start: 2022-05-05 — End: 2022-05-05

## 2022-05-04 MED ORDER — ACETAMINOPHEN 160 MG/5ML PO SOLN
650.0000 mg | ORAL | Status: DC | PRN
  Administered 2022-05-11 (×2): 650 mg
  Filled 2022-05-04 (×2): qty 20.3

## 2022-05-04 MED ORDER — IOHEXOL 300 MG/ML  SOLN
75.0000 mL | Freq: Once | INTRAMUSCULAR | Status: AC | PRN
  Administered 2022-05-04: 75 mL via INTRAVENOUS

## 2022-05-04 MED ORDER — SODIUM CHLORIDE 0.9 % IV SOLN: INTRAVENOUS | Status: AC

## 2022-05-04 NOTE — ED Notes (Signed)
Purewick placed at this time patient and family updated on the plan of care and verbalize understanding

## 2022-05-04 NOTE — Assessment & Plan Note (Signed)
Allowing for permissive hypertension due to new right MCA stroke

## 2022-05-04 NOTE — ED Notes (Signed)
Verbal report given to [employer]

## 2022-05-04 NOTE — ED Notes (Signed)
Patient reminded of the need of a urine sample at this time 

## 2022-05-04 NOTE — ED Triage Notes (Addendum)
Per family member at bedside, pt was discharged from [facility] from being treated for a stroke. Pt has a right sided facial droop and left sided arm and leg weakness. Pt alert and able to answer questions, denies any pain. Family reports pt is still very weak and needs further treatment.   Daughter at bedside reports she was at her baseline last night when she came home but this morning the pt was having difficulty swallowing her morning meds and oatmeal, states the facial droop is from a prior stroke.

## 2022-05-04 NOTE — Progress Notes (Addendum)
ANTICOAGULATION CONSULT NOTE - Initial Consult  Pharmacy Consult for Heparin Indication: pulmonary embolus  No Known Allergies  Patient Measurements: Height: 5\' 1"  (154.9 cm) Weight: 66.7 kg (147 lb 0.8 oz) IBW/kg (Calculated) : 47.8 Heparin Dosing Weight: 61.8 kg  Vital Signs: Temp: 98.4 F (36.9 C) (07/30 2019) Temp Source: Oral (07/30 1333) BP: 140/65 (07/30 2100) Pulse Rate: 67 (07/30 2100)  Labs: Recent Labs    05/04/22 1359 05/04/22 1421 05/04/22 1949  HGB 10.9*  --   --   HCT 33.7*  --   --   PLT 250  --   --   APTT 35  --   --   LABPROT 14.8  --   --   INR 1.2  --   --   CREATININE 0.81  --   --   TROPONINIHS  --  17 14    Estimated Creatinine Clearance: 43.6 mL/min (by C-G formula based on SCr of 0.81 mg/dL).   Medical History: Past Medical History:  Diagnosis Date   Ankle edema    Arthritis    right hip   Bronchitis    in Dec   CAD (coronary artery disease)    a. s/p DES to LAD in 2008, NST in 2015 and 2017 which were low-risk   Carotid stenosis    mild   Cataract    left;not ready to come off   Constipation    takes OTC stool softener as needed   Diabetes mellitus    pt doesn't take any meds for it   DJD (degenerative joint disease) of hip    right   Dry cough    GERD (gastroesophageal reflux disease)    Headache(784.0)    occasionally   HTN (hypertension)    takes Amlodpine and Carvedilol daily   Hx of cardiovascular stress test    Lexiscan Myoview (04/2014):  No ischemia, EF 64%, Low Risk   Hypercholesteremia    takes Crestor daily   Hypothyroidism    takes Synthroid daily   Ileus, postoperative ([facility]) 05/12/2014   Myocardial infarction [facility])    pt unsure but thinks about 47yrs ago;is taking Plavix daily    Nocturia    Osteoarthritis of left hip 05/09/2014   Primary osteoarthritis of right hip 10/27/2011    Medications:  (Not in a hospital admission)  Scheduled:   heparin  4,000 Units Intravenous Once   sodium chloride flush  3  mL Intravenous Once   Infusions:   heparin     PRN:   Assessment: 89 yof with a history of CAD, GERD, HTN, hypothyroidism, MI. Patient is presenting with weakness and dysphagia. Heparin per pharmacy consult placed for pulmonary embolus.  CT Head w/ New small ill-defined hypodensity at the right cerebral peduncle may reflect acute infarct.  CT Chest with large bilateral central PE extending into lobar branches no RHS on CT  Per Neurology, use PE goal heparin level.  Patient is not on anticoagulation prior to arrival.  Hgb 10.9; plt 250  Goal of Therapy:  Heparin level 0.3-0.7 units/ml Monitor platelets by anticoagulation protocol: Yes   Plan:  Give IV heparin 4000 units bolus x 1 Start heparin infusion at 1000 units/hr Check anti-Xa level in 8 hours and daily while on heparin Continue to monitor H&H and platelets  88, PharmD, BCPS 05/04/2022 10:11 PM ED Clinical Pharmacist -  [phone]

## 2022-05-04 NOTE — H&P (Signed)
History and Physical    Patient: [name] [name] STM:[id number] DOB: 1934/09/07 DOA: 05/04/2022 DOS: the patient was seen and examined on 05/05/2022 PCP: Oneal Grout, FNP  Patient coming from: Home  Chief Complaint:  Chief Complaint  Patient presents with   Weakness   Dysphagia   HPI: [name] [name] is a 88 y.o. female with medical history significant of stroke on aspirin and Plavix left-sided hemiparesis, CABG, hypertension, chronic diastolic heart failure, and hypothyroidism who presents with concerns of dysphagia.  Patient was recently hospitalized at [facility] overnight about 2 days ago.  Unfortunately unable to see records.  She presented there with a headache and reportedly was admitted for hypokalemia.  Family believes a CT head scan was done and her headache also resolved at discharge.  The night following discharge she was doing well and able to swallow her pills.  Then this morning when she woke up daughter noticed she was drooling all over when she attempted to take her pills.  Then again when she tried eating breakfast prompting daughter to take her to the ED.  Patient denies any headache or blurry vision.  No chest pain, shortness of breath.  No nausea, vomiting. Daughter also notes that about 3 weeks ago she was prescribed potassium supplementation and had persistent dark-colored diarrhea.  This resolved intermittently in terms of both diarrhea and her stool returning to brown color, but again about 6 days ago there was another episode of dark stool.  She is on aspirin and Plavix for her CVA  In the ED, she was afebrile and normotensive on room air. Had mild leukocytosis of 11.3, hemoglobin 10.9 which appears to be downward trending 12.8 back in 06/2021.  No other significant electrolyte abnormalities.  EKG my review sinus rhythm and slight ST depression in V4 to V6 which is also seen on prior EKGs.   CT head with new small ill-defined hypodensity at the right cerebral  peduncle. MRI brain revealing for acute right MCA distribution infarct involving the right insula and overlying the right frontal lobe.  CT chest with large bilateral central pulmonary artery emboli extending into the lobar branch with no evidence of right heart strain and evidence of right heart strain.  Trace left pleural effusion with developing infarct not excluded.  ED PA discussed with neurology Dr. Otelia Limes and after extensive discussion with him and with patient and her family.  They have decided to initiate IV heparin for a PE in the setting of new stroke.  Hospitalist then consult for admission. Review of Systems: As mentioned in the history of present illness. All other systems reviewed and are negative. Past Medical History:  Diagnosis Date   Ankle edema    Arthritis    right hip   Bronchitis    in Dec   CAD (coronary artery disease)    a. s/p DES to LAD in 2008, NST in 2015 and 2017 which were low-risk   Carotid stenosis    mild   Cataract    left;not ready to come off   Constipation    takes OTC stool softener as needed   Diabetes mellitus    pt doesn't take any meds for it   DJD (degenerative joint disease) of hip    right   Dry cough    GERD (gastroesophageal reflux disease)    Headache(784.0)    occasionally   HTN (hypertension)    takes Amlodpine and Carvedilol daily   Hx of cardiovascular stress test  Lexiscan Myoview (04/2014):  No ischemia, EF 64%, Low Risk   Hypercholesteremia    takes Crestor daily   Hypothyroidism    takes Synthroid daily   Ileus, postoperative (HCC) 05/12/2014   Myocardial infarction [facility])    pt unsure but thinks about 15yrs ago;is taking Plavix daily    Nocturia    Osteoarthritis of left hip 05/09/2014   Primary osteoarthritis of right hip 10/27/2011   Past Surgical History:  Procedure Laterality Date   ABDOMINAL HYSTERECTOMY  20+yrs   BACK SURGERY  15+yrs ago   CARDIAC CATHETERIZATION  2008   CORONARY ARTERY BYPASS GRAFT      LYMPH NODE DISSECTION  20+yrs ago   right    TOTAL HIP ARTHROPLASTY  10/27/2011   Procedure: TOTAL HIP ARTHROPLASTY;  Surgeon: Eulas Post, MD;  Location: [facility] OR;  Service: Orthopedics;  Laterality: Right;   TOTAL HIP ARTHROPLASTY Left 05/09/2014   Procedure: LEFT TOTAL HIP ARTHROPLASTY;  Surgeon: Eulas Post, MD;  Location: [facility] OR;  Service: Orthopedics;  Laterality: Left;   Social History:  reports that she has never smoked. She has never used smokeless tobacco. She reports that she does not drink alcohol and does not use drugs.  No Known Allergies  Family History  Problem Relation Age of Onset   Hypertension Mother    Stroke Mother    Hypertension Father    Heart attack Brother    Heart attack Sister    Hypertension Sister    Hypertension Brother    Hypertension Daughter    Hypertension Son    Hyperlipidemia Sister    Diabetes Brother    Anesthesia problems Neg Hx    Hypotension Neg Hx    Malignant hyperthermia Neg Hx    Pseudochol deficiency Neg Hx     Prior to Admission medications   Medication Sig Start Date End Date Taking? Authorizing Provider  amLODipine (NORVASC) 10 MG tablet Take 1 tablet (10 mg total) by mouth daily. 09/28/19   Netta Neat., NP  amoxicillin-clavulanate (AUGMENTIN) 875-125 MG tablet Take 1 tablet by mouth 2 (two) times daily. 08/30/21   [provider]  aspirin EC 81 MG tablet Take 1 tablet (81 mg total) by mouth daily. Swallow whole. 01/31/22   Strader, Lennart Pall, PA-C  clopidogrel (PLAVIX) 75 MG tablet TAKE 1 TABLET BY MOUTH ONCE DAILY. Patient taking differently: Take 75 mg by mouth daily. 10/10/19   Laqueta Linden, MD  enalapril (VASOTEC) 20 MG tablet TAKE 1 TABLET BY MOUTH ONCE DAILY. 03/07/20   Laqueta Linden, MD  ezetimibe (ZETIA) 10 MG tablet TAKE 1 TABLET BY MOUTH ONCE DAILY. Patient taking differently: Take 10 mg by mouth daily. 11/30/19   Laqueta Linden, MD  fluticasone (FLONASE) 50 MCG/ACT nasal spray  Place 1 spray into both nostrils daily. Patient taking differently: Place 1 spray into both nostrils daily as needed for allergies or rhinitis. 04/28/20   Maxwell Caul, PA-C  isosorbide mononitrate (IMDUR) 30 MG 24 hr tablet TAKE 1 TABLET BY MOUTH ONCE DAILY. Patient taking differently: Take 30 mg by mouth daily. 03/08/20   Laqueta Linden, MD  levothyroxine (SYNTHROID) 150 MCG tablet Take 150 mcg by mouth daily.    [provider]  nitroGLYCERIN (NITROSTAT) 0.4 MG SL tablet Place 1 tablet (0.4 mg total) under the tongue every 5 (five) minutes as needed for chest pain. 10/25/20 01/31/22  Antoine Poche, MD  rosuvastatin (CRESTOR) 40 MG tablet TAKE 1 TABLET  BY MOUTH ONCE DAILY. Patient taking differently: Take 40 mg by mouth daily. 11/30/19   Laqueta Linden, MD  spironolactone (ALDACTONE) 25 MG tablet TAKE 1 TABLET BY MOUTH ONCE DAILY. Patient taking differently: Take 25 mg by mouth daily. 10/10/19   Laqueta Linden, MD    Physical Exam: Vitals:   05/04/22 2130 05/04/22 2145 05/04/22 2200 05/05/22 0023  BP: 125/66 (!) 166/83 135/76 138/65  Pulse: 65 96 67 63  Resp:      Temp:    99.1 F (37.3 C)  TempSrc:    Oral  SpO2: 99% 100% 99% 99%  Weight:      Height:       Constitutional: NAD, calm, comfortable, elderly female laying in bed Eyes:  lids and conjunctivae normal ENMT: Mucous membranes are moist.  Poor dentition.   Neck: normal, supple Respiratory: clear to auscultation bilaterally, no wheezing, no crackles. Normal respiratory effort. No accessory muscle use.  Cardiovascular: Regular rate and rhythm, no murmurs / rubs / gallops. No extremity edema.  Abdomen: Soft, nondistended no tenderness, Bowel sounds positive.  Musculoskeletal: no clubbing / cyanosis.  Skin: no rashes, lesions, ulcers.  Neurologic: CN 2-12 grossly intact.  Left facial droop which is chronic.  Left residual hemiparesis from previous stroke.  5 out of 5 strength of the right upper and  lower extremity. Psychiatric: Normal judgment and insight. Alert and oriented x 3. Normal mood. Data Reviewed:  See HPI  Assessment and Plan: * Acute right MCA stroke (HCC) - obtain CTA head and neck - echocardiogram  -stat EEG also ordered by neurology -Obtain A1c and lipids -PT/OT/SLT -Frequent neuro checks and keep on telemetry -Allow for permissive hypertension with blood pressure treatment as needed only if systolic goes above 220 -pt is on IV heparin for new pulmonary embolism and also has endorsed recent potential GI bleed.  Risk outweighs benefits of starting antiplatelet therapy while also on heparin-appreciate further recommendation from neurology   Anemia hemoglobin 10.9 which appears to be downward trending from 12.8 back in 06/2021 -reports intermittent dark stool for the past few weeks. Last episode was about 6 days ago. Check FOBT.  Unfortunately she will need to be on anticoagulation for a substantial pulmonary embolism. Will need to keep close monitoring of her Hgb while on IV heparin. Family is aware of the potential risk of recurrent GI bleed while on heparin.  Acute pulmonary embolism (HCC) -CT chest with large bilateral central pulmonary artery emboli extending into the lobar branch with no evidence of right heart strain and evidence of right heart strain.  Trace left pleural effusion with developing infarct not excluded. -Suspect due to patient being bedbound due to her previous stroke resulting in left-sided hemiparesis  -ED PA had extensive discussion with neurology and with patient's family.  Family has decided to proceed with heparinization which they confirmed with me as well knowing the benefits and risk of hemorrhagic conversion in the setting of a new stroke. -Continue IV heparin.    Hypothyroidism Resume levothyroxine pending SLP evaluation  Chronic diastolic heart failure (HCC) Stable and compensated  Essential hypertension Allowing for permissive  hypertension due to new right MCA stroke      Advance Care Planning:   Code Status: DNR -confirmed with patient with daughter and son  Consults: Neurology  Family Communication: Discussed with daughter and son at bedside  Severity of Illness: The appropriate patient status for this patient is OBSERVATION. Observation status is judged to be reasonable and necessary  in order to provide the required intensity of service to ensure the patient's safety. The patient's presenting symptoms, physical exam findings, and initial radiographic and laboratory data in the context of their medical condition is felt to place them at decreased risk for further clinical deterioration. Furthermore, it is anticipated that the patient will be medically stable for discharge from the hospital within 2 midnights of admission.   Author: Anselm Jungling, DO 05/05/2022 12:59 AM  For on call review [email or web address].

## 2022-05-04 NOTE — Consult Note (Signed)
NEURO HOSPITALIST CONSULT NOTE   Requesting physician: Dr. Particia Nearing  Reason for Consult: Left sided weakness  History obtained from:  Chart     HPI:                                                                                                                                          [name] [name] is an 88 y.o. female with an extensive PMHx as documented below who presented to the [facility] on Sunday afternoon with facial droop and left sided weakness after having been discharged from [facility] following treatment there for a stroke. On arrival she was alert and able to answer questions. Family reported the the patient was "still very weak and needs further treatment." Daughter, who accompanied the patient to the ED, reported that the patient was at her baseline Saturday night when she came home from the hospital, but on Sunday morning she was having difficulty swallowing her morning meds and oatmeal.   On evaluation by Neurology, the patient is a poor historian and unable to provide a consistent timeline regarding the left facial droop and LUE and LLE weakness that are seen on exam. MRI reveals a subacute right lateral frontal lobe cortically based infarction of moderated size and scattered old ischemic infarctions.   HPI from EDP note has been reviewed: "[name] [name] is a 88 y.o. female who presents today for evaluation of difficulty swallowing. History is obtained from patient, her daughter who lives with her and family at bedside. Patient was reportedly admitted at outside hospital from Thursday and discharged yesterday for headaches and hypokalemia.  They are unsure what her potassium was. Family is not entirely sure what testing was done, if patient had an MRI or not and does not have any records. He states that last night she was able to swallow normally, but do know that previously she has had a feeding tube. She has a history of a stroke resulting in facial weakness  and left-sided arm and leg weakness. Both patient and family agree that patient does not have any new weakness in her bilateral arms or legs.  Patient denies any vision changes. Family does feel like patient will intermittently have worsening of her slurred speech but this is not a new change. This morning when they were attempting to help feed her her oatmeal for breakfast she had a difficult time holding the food in her mouth and was drooling.  She has not eat or drink anything since.  She was not coughing after. Previously patient had a feeding tube, it has since been removed."  Home medications include ASA, Plavix and rosuvastatin.    Past Medical History:  Diagnosis Date   Ankle edema    Arthritis    right hip  Bronchitis    in Dec   CAD (coronary artery disease)    a. s/p DES to LAD in 2008, NST in 2015 and 2017 which were low-risk   Carotid stenosis    mild   Cataract    left;not ready to come off   Constipation    takes OTC stool softener as needed   Diabetes mellitus    pt doesn't take any meds for it   DJD (degenerative joint disease) of hip    right   Dry cough    GERD (gastroesophageal reflux disease)    Headache(784.0)    occasionally   HTN (hypertension)    takes Amlodpine and Carvedilol daily   Hx of cardiovascular stress test    Lexiscan Myoview (04/2014):  No ischemia, EF 64%, Low Risk   Hypercholesteremia    takes Crestor daily   Hypothyroidism    takes Synthroid daily   Ileus, postoperative (HCC) 05/12/2014   Myocardial infarction [facility])    pt unsure but thinks about 35yrs ago;is taking Plavix daily    Nocturia    Osteoarthritis of left hip 05/09/2014   Primary osteoarthritis of right hip 10/27/2011    Past Surgical History:  Procedure Laterality Date   ABDOMINAL HYSTERECTOMY  20+yrs   BACK SURGERY  15+yrs ago   CARDIAC CATHETERIZATION  2008   CORONARY ARTERY BYPASS GRAFT     LYMPH NODE DISSECTION  20+yrs ago   right    TOTAL HIP ARTHROPLASTY   10/27/2011   Procedure: TOTAL HIP ARTHROPLASTY;  Surgeon: Eulas Post, MD;  Location: [facility] OR;  Service: Orthopedics;  Laterality: Right;   TOTAL HIP ARTHROPLASTY Left 05/09/2014   Procedure: LEFT TOTAL HIP ARTHROPLASTY;  Surgeon: Eulas Post, MD;  Location: [facility] OR;  Service: Orthopedics;  Laterality: Left;    Family History  Problem Relation Age of Onset   Hypertension Mother    Stroke Mother    Hypertension Father    Heart attack Brother    Heart attack Sister    Hypertension Sister    Hypertension Brother    Hypertension Daughter    Hypertension Son    Hyperlipidemia Sister    Diabetes Brother    Anesthesia problems Neg Hx    Hypotension Neg Hx    Malignant hyperthermia Neg Hx    Pseudochol deficiency Neg Hx               Social History:  reports that she has never smoked. She has never used smokeless tobacco. She reports that she does not drink alcohol and does not use drugs.  No Known Allergies  MEDICATIONS:                                                                                                                     Prior to Admission:  Medications Prior to Admission  Medication Sig Dispense Refill Last Dose   amLODipine (NORVASC) 10 MG tablet Take 1 tablet (10 mg total) by mouth daily. 90 tablet  3    amoxicillin-clavulanate (AUGMENTIN) 875-125 MG tablet Take 1 tablet by mouth 2 (two) times daily.      aspirin EC 81 MG tablet Take 1 tablet (81 mg total) by mouth daily. Swallow whole. 30 tablet 11    clopidogrel (PLAVIX) 75 MG tablet TAKE 1 TABLET BY MOUTH ONCE DAILY. (Patient taking differently: Take 75 mg by mouth daily.) 90 tablet 1    enalapril (VASOTEC) 20 MG tablet TAKE 1 TABLET BY MOUTH ONCE DAILY. 30 tablet 0    ezetimibe (ZETIA) 10 MG tablet TAKE 1 TABLET BY MOUTH ONCE DAILY. (Patient taking differently: Take 10 mg by mouth daily.) 30 tablet 11    fluticasone (FLONASE) 50 MCG/ACT nasal spray Place 1 spray into both nostrils daily. (Patient taking  differently: Place 1 spray into both nostrils daily as needed for allergies or rhinitis.) 16 g 2    isosorbide mononitrate (IMDUR) 30 MG 24 hr tablet TAKE 1 TABLET BY MOUTH ONCE DAILY. (Patient taking differently: Take 30 mg by mouth daily.) 30 tablet 11    levothyroxine (SYNTHROID) 150 MCG tablet Take 150 mcg by mouth daily.      nitroGLYCERIN (NITROSTAT) 0.4 MG SL tablet Place 1 tablet (0.4 mg total) under the tongue every 5 (five) minutes as needed for chest pain. 25 tablet 3    rosuvastatin (CRESTOR) 40 MG tablet TAKE 1 TABLET BY MOUTH ONCE DAILY. (Patient taking differently: Take 40 mg by mouth daily.) 30 tablet 11    spironolactone (ALDACTONE) 25 MG tablet TAKE 1 TABLET BY MOUTH ONCE DAILY. (Patient taking differently: Take 25 mg by mouth daily.) 90 tablet 1    Scheduled:   stroke: early stages of recovery book   Does not apply Once   sodium chloride flush  3 mL Intravenous Once   Continuous:  sodium chloride 75 mL/hr at 05/05/22 0131   heparin 1,000 Units/hr (05/04/22 2231)     ROS:                                                                                                                                       In the context of sparse and slow speech the patient states that she has chronic left sided weakness but does not endorse any additional complaints   Blood pressure (!) 143/65, pulse 66, temperature 98.4 F (36.9 C), resp. rate 18, height 5\' 1"  (1.549 m), weight 66.7 kg, SpO2 99 %.   General Examination:  Physical Exam  HEENT-  [place]/AT. Poor dentition.   Lungs- Respirations unlabored.  Extremities- No edema  Neurological Examination Mental Status: Awakens easily from sleep. Speech is dysarthric, slow and sparse, but fluent. Basic naming intact. Comprehension intact to all basic questions and commands. Oriented to "[place], [place]", and does not know the year, but correctly  identifies the day of the week and the month.  Cranial Nerves: II: Visual fields intact bilaterally when tested individually, but there is extinction on the left with DSS,. PERRL.   III,IV, VI: No ptosis. Horizontal EOMI, but has a tendency to gaze more frequently to right sided stimuli than left. No nystagmus.  V: Temp sensation intact bilaterally VII: Prominent left facial droop VIII: Hearing intact to voice IX,X: Vocalizations intact XI: Head is able to turn to the left and right XII: midline tongue extension Motor: RUE 5/5 RLE 4/5 LUE 0/5 proximally and distally with spasticity and chronic flexion contractures at the elbow, wrist and digits.  LLE 0/5 proximally and distally with spasticity; internally rotated foot with "scissoring" of leg to the right.  Sensory: Decreased temp and FT sensation to LUE and LLE. Positive for extinction on the left to DSS.  Deep Tendon Reflexes: 3+ low amplitude brisk reflexes to LUE and LLE. Reflexes 2+ to brachioradialis and patella on the right. Right toe downgoing, left toe upgoing Cerebellar: No ataxia noted on the right. Unable to assess on the left due to hemiplegia Gait: Unable to assess   Lab Results: Basic Metabolic Panel: Recent Labs  Lab 05/04/22 1359 05/04/22 1421  NA 141  --   K 3.9  --   CL 106  --   CO2 28  --   GLUCOSE 113*  --   BUN 8  --   CREATININE 0.81  --   CALCIUM 8.9  --   MG  --  1.9    CBC: Recent Labs  Lab 05/04/22 1359  WBC 11.3*  NEUTROABS 8.1*  HGB 10.9*  HCT 33.7*  MCV 90.3  PLT 250    Cardiac Enzymes: No results for input(s): "CKTOTAL", "CKMB", "CKMBINDEX", "TROPONINI" in the last 168 hours.  Lipid Panel: No results for input(s): "CHOL", "TRIG", "HDL", "CHOLHDL", "VLDL", "LDLCALC" in the last 168 hours.  Imaging: CT Chest W Contrast  Result Date: 05/04/2022 CLINICAL DATA:  Respiratory illness. EXAM: CT CHEST WITH CONTRAST TECHNIQUE: Multidetector CT imaging of the chest was performed during  intravenous contrast administration. RADIATION DOSE REDUCTION: This exam was performed according to the departmental dose-optimization program which includes automated exposure control, adjustment of the mA and/or kV according to patient size and/or use of iterative reconstruction technique. CONTRAST:  75mL OMNIPAQUE IOHEXOL 300 MG/ML  SOLN COMPARISON:  Chest radiograph dated 05/04/2022. FINDINGS: Cardiovascular: There is no cardiomegaly or pericardial effusion. Advanced 3 vessel coronary vascular calcification. Mild atherosclerotic calcification of the thoracic aorta. No aneurysmal dilatation. The origins of the great vessels of the aortic arch appear patent. Evaluation of the pulmonary arteries is limited there is a timing of the contrast and suboptimal opacification. However, a large bilateral central pulmonary artery emboli extending into the lobar branches noted. No evidence of right heart straining. Mediastinum/Nodes: No hilar or mediastinal adenopathy. The esophagus is grossly unremarkable. There is a 13 mm right thyroid hypodense nodule. In the setting of significant comorbidities or limited life expectancy, no follow-up recommended (ref: J Am Coll Radiol. 2015 Feb;12(2): 143-50).No mediastinal fluid collection. Lungs/Pleura: Trace left pleural effusion. There are bibasilar dependent atelectasis. A somewhat wedge  shaped pleural based ground-glass area at the left lung base may represent atelectasis. Developing infarct is not excluded. There is no pneumothorax. The central airways are patent. Upper Abdomen: Indeterminate 2 x 2 cm enhancing area in the posterior right lobe of the liver, not characterized on this CT but may represent a hemangioma or vascular shunting. This can be better evaluated with MRI or ultrasound on a nonemergent/outpatient basis. Partially visualized gallbladder with possible small pericholecystic fluid or small stone versus calcification. Right upper quadrant ultrasound may provide  better evaluation. Small bilateral renal hypodense lesions are not characterized. Indeterminate 2.5 x 1.2 cm fluid attenuating structure in the left upper abdomen (123/3). Evaluation of the upper abdominal structures is very limited due to respiratory motion and partial visualization. Musculoskeletal: Osteopenia with degenerative changes of spine. No acute osseous pathology. IMPRESSION: 1. Large bilateral central pulmonary artery emboli extending into the lobar branches. No evidence of right heart straining. 2. Trace left pleural effusion with bibasilar dependent atelectasis. Developing infarct is not excluded. 3. Other nonemergent findings as above. 4. Aortic Atherosclerosis (ICD10-I70.0). These results were called by telephone at the time of interpretation on 05/04/2022 at 8:00 pm to provider [facility] , who verbally acknowledged these results. Electronically Signed   By: Elgie CollardArash  Radparvar M.D.   On: 05/04/2022 20:01   CT HEAD WO CONTRAST  Result Date: 05/04/2022 CLINICAL DATA:  Patient recently discharged after being hospitalized for stroke. New difficulty swallowing this morning. EXAM: CT HEAD WITHOUT CONTRAST TECHNIQUE: Contiguous axial images were obtained from the base of the skull through the vertex without intravenous contrast. RADIATION DOSE REDUCTION: This exam was performed according to the departmental dose-optimization program which includes automated exposure control, adjustment of the mA and/or kV according to patient size and/or use of iterative reconstruction technique. COMPARISON:  CT head 07/19/2021. FINDINGS: Brain: New small ill-defined hypodensity at the right cerebral peduncle (series 3, image 15). No evidence of acute hemorrhage, hydrocephalus, extra-axial collection or mass lesion/mass effect. Chronic infarcts noted in the anterior and mid right frontal lobe as well as the left aspect of the pons. Vascular: Vascular calcifications at the skull base. No hyperdense vessel or  unexpected calcification. Skull: Normal. Negative for fracture or focal lesion. Sinuses/Orbits: No acute finding. Trace mucosal thickening in the left maxillary sinus. Hypoplastic left frontal sinus. Other: None. IMPRESSION: 1. New small ill-defined hypodensity at the right cerebral peduncle may reflect acute infarct. Consider further evaluation with MRI. 2. Chronic infarcts in the right frontal lobe and left pons. Electronically Signed   By: Sherron AlesLaura  Parra M.D.   On: 05/04/2022 16:32   DG Chest 2 View  Result Date: 05/04/2022 CLINICAL DATA:  Difficulty swelling. Unable to lift left arm. EXAM: CHEST - 2 VIEW COMPARISON:  Two-view chest x-ray 01/21/2017 FINDINGS: Heart size normal. Atherosclerotic calcifications are present at the aortic arch. Small bilateral pleural effusions are present. Mild left greater than right basilar airspace opacities are noted. The upper lung fields are clear. Visualized soft tissues and bony thorax are otherwise unremarkable. IMPRESSION: 1. Small bilateral pleural effusions. 2. Mild left greater than right basilar airspace disease likely reflects atelectasis. Infection or aspiration is not excluded. 3. Aortic atherosclerosis. Electronically Signed   By: Marin Robertshristopher  Mattern M.D.   On: 05/04/2022 16:12     Assessment: [id number] year old female 88 y.o. female with an extensive PMHx as documented above [facility] with facial droop and left sided weakness [facility] for a stroke. On arrival she was alert and able to answer questions. Family reported the the patient was "still very weak and needs further treatment." Daughter, who accompanied the patient to the ED, reported that the patient was at her baseline Saturday night when she came home from the hospital, but on Sunday morning she was having difficulty swallowing her morning meds and oatmeal. On evaluation by Neurology, the patient is a  poor historian and unable to provide a consistent timeline regarding the left facial droop and LUE and LLE weakness that are seen on exam. MRI reveals a subacute right lateral frontal lobe cortically based infarction of moderated size and scattered old ischemic infarctions.  1. Exam reveals left sided spastic weakness with contractures most consistent with chronic UMN deficit. Also with left sided facial droop that is prominent but of uncertain chronicity.  2. MRI brain does reveal an early subacute right MCA distribution infarct involving the right insula and overlying right frontal lobe. Also noted is underlying age-related cerebral atrophy with moderate to advanced chronic microvascular ischemic disease, in addition to multiple remote infarcts. 3. On review by Neurology of the images from the above MRI brain, it is felt that the predominantly cortically-based restricted diffusion in the right insular region may also represent seizure-related edema. An EEG will need to be obtained to further assess. Of note, there is no clinical seizure activity seen on exam.  4. CTA neck: Aortic arch: Partial coverage of the arch shows atherosclerosis without aneurysm. Right carotid system: Mild for age atheromatous plaque at the bifurcation without flow reducing stenosis or ulceration. Left carotid system: Moderate mainly calcified plaque at the bifurcation without flow reducing stenosis. Mixed density plaque at the common carotid; negative for ulceration or beading. Vertebral arteries: No proximal subclavian stenosis or irregularity. The right vertebral artery is dominant. The vertebral arteries are widely patent to the level of the dura. 5. CTA of head: Anterior circulation: Confluent atheromatous plaque on the carotid siphons. Atheromatous irregularity of medium size branches which is also generalized. No major branch occlusion or correctable flow limiting stenosis. The affected right MCA branch may be recanalized.  Negative for aneurysm. Posterior circulation: Dominant right vertebral artery showing calcified plaque. The vertebral and basilar arteries are diffusely patent. Atheromatous irregularity of the posterior cerebral arteries including at least moderate right PCA stenosis before the bifurcation. High-grade narrowing of the left PCA at its bifurcation. Of note, there is no flow-limiting stenosis or ulceration underlying the acute right MCA territory infarct.  Recommendations: 1. EEG has been ordered 2. Obtain records from her admission to the hospital in [place] to avoid potentially unnecessary additional testing.  3. Continue ASA, Plavix and atorvastatin.  4. PT/OT/Speech. May be a candidate for inpatient rehab.  5. Cardiac telemetry 6. BP management 7. Maintain optimal hydration. 8. Stroke Team to follow in the AM.     Electronically signed: Dr. Caryl Pina 05/04/2022, 8:55 PM

## 2022-05-04 NOTE — Assessment & Plan Note (Signed)
Resume levothyroxine pending SLP evaluation

## 2022-05-04 NOTE — Assessment & Plan Note (Signed)
-  CT chest with large bilateral central pulmonary artery emboli extending into the lobar branch with no evidence of right heart strain and evidence of right heart strain.  Trace left pleural effusion with developing infarct not excluded. -Suspect due to patient being bedbound due to her previous stroke resulting in left-sided hemiparesis  -ED PA had extensive discussion with neurology and with patient's family.  Family has decided to proceed with heparinization which they confirmed with me as well knowing the benefits and risk of hemorrhagic conversion in the setting of a new stroke. -Continue IV heparin.

## 2022-05-04 NOTE — Assessment & Plan Note (Signed)
hemoglobin 10.9 which appears to be downward trending from 12.8 back in 06/2021 -reports intermittent dark stool for the past few weeks. Last episode was about 6 days ago. Check FOBT.  Unfortunately she will need to be on anticoagulation for a substantial pulmonary embolism. Will need to keep close monitoring of her Hgb while on IV heparin. Family is aware of the potential risk of recurrent GI bleed while on heparin.

## 2022-05-04 NOTE — Assessment & Plan Note (Signed)
Stable and compensated. 

## 2022-05-04 NOTE — ED Provider Notes (Signed)
[facility] EMERGENCY DEPARTMENT Provider Note   CSN: [id number] Arrival date & time: 05/04/22  1329     History  Chief Complaint  Patient presents with   Weakness   Dysphagia    [name] [name] is a 87 y.o. female who presents today for evaluation of difficulty swallowing. History is obtained from patient, her daughter who lives with her and family at bedside. Patient was reportedly admitted at outside hospital from Thursday and discharged yesterday for headaches and hypokalemia.  They are unsure what her potassium was. Family is not entirely sure what testing was done, if patient had an MRI or not and does not have any records. He states that last night she was able to swallow normally, but do know that previously she has had a feeding tube.  She has a history of a stroke resulting in facial weakness and left-sided arm and leg weakness. Both patient and family agree that patient does not have any new weakness in her bilateral arms or legs.  Patient denies any vision changes. Family does feel like patient will intermittently have worsening of her slurred speech but this is not a new change. This morning when they were attempting to help feed her her oatmeal for breakfast she had a difficult time holding the food in her mouth and was drooling.  She has not eat or drink anything since.  She was not coughing after.  Previously patient had a feeding tube, it has since been removed.   Of note patient denies any chest pain, back pain, abdominal pain.  She denies any shortness of breath. HPI     Home Medications Prior to Admission medications   Medication Sig Start Date End Date Taking? Authorizing Provider  amLODipine (NORVASC) 10 MG tablet Take 1 tablet (10 mg total) by mouth daily. 09/28/19   Netta Neat., NP  amoxicillin-clavulanate (AUGMENTIN) 875-125 MG tablet Take 1 tablet by mouth 2 (two) times daily. 08/30/21   [provider]  aspirin EC 81  MG tablet Take 1 tablet (81 mg total) by mouth daily. Swallow whole. 01/31/22   Strader, Lennart Pall, PA-C  clopidogrel (PLAVIX) 75 MG tablet TAKE 1 TABLET BY MOUTH ONCE DAILY. Patient taking differently: Take 75 mg by mouth daily. 10/10/19   Laqueta Linden, MD  enalapril (VASOTEC) 20 MG tablet TAKE 1 TABLET BY MOUTH ONCE DAILY. 03/07/20   Laqueta Linden, MD  ezetimibe (ZETIA) 10 MG tablet TAKE 1 TABLET BY MOUTH ONCE DAILY. Patient taking differently: Take 10 mg by mouth daily. 11/30/19   Laqueta Linden, MD  fluticasone (FLONASE) 50 MCG/ACT nasal spray Place 1 spray into both nostrils daily. Patient taking differently: Place 1 spray into both nostrils daily as needed for allergies or rhinitis. 04/28/20   Maxwell Caul, PA-C  isosorbide mononitrate (IMDUR) 30 MG 24 hr tablet TAKE 1 TABLET BY MOUTH ONCE DAILY. Patient taking differently: Take 30 mg by mouth daily. 03/08/20   Laqueta Linden, MD  levothyroxine (SYNTHROID) 150 MCG tablet Take 150 mcg by mouth daily.    [provider]  nitroGLYCERIN (NITROSTAT) 0.4 MG SL tablet Place 1 tablet (0.4 mg total) under the tongue every 5 (five) minutes as needed for chest pain. 10/25/20 01/31/22  Antoine Poche, MD  rosuvastatin (CRESTOR) 40 MG tablet TAKE 1 TABLET BY MOUTH ONCE DAILY. Patient taking differently: Take 40 mg by mouth daily. 11/30/19   Laqueta Linden, MD  spironolactone (ALDACTONE) 25 MG tablet  TAKE 1 TABLET BY MOUTH ONCE DAILY. Patient taking differently: Take 25 mg by mouth daily. 10/10/19   Laqueta Linden, MD      Allergies    Patient has no known allergies.    Review of Systems   Review of Systems  Physical Exam Updated Vital Signs BP 135/76   Pulse 67   Temp 98.4 F (36.9 C)   Resp 18   Ht 5\' 1"  (1.549 m)   Wt 66.7 kg   SpO2 99%   BMI 27.78 kg/m  Physical Exam Vitals and nursing note reviewed.  Constitutional:      Comments: Chronically ill-appearing  HENT:     Head:      Comments: Obvious facial droop, per patient and family members is at her baseline.    Mouth/Throat:     Mouth: Mucous membranes are moist.  Eyes:     Conjunctiva/sclera: Conjunctivae normal.  Cardiovascular:     Rate and Rhythm: Normal rate.     Pulses: Normal pulses.  Pulmonary:     Effort: Pulmonary effort is normal. No respiratory distress.     Breath sounds: Rhonchi present.  Abdominal:     General: There is no distension.     Tenderness: There is no abdominal tenderness. There is no guarding.  Musculoskeletal:     Cervical back: Normal range of motion and neck supple.     Comments: Chronic deconditioning of the left arm and leg consistent with neurologic deficits from prior stroke.  She is able to lift her right arm and right leg off the bed without difficulty. No obvious acute injury.  Skin:    General: Skin is warm.  Neurological:     Mental Status: She is alert.     Comments: Awake and alert, answers all questions appropriately.  Speech is slightly slurred. Facial droop and weakness of the left arm and leg are at baseline according to patient and family members at bedside. Patient is awake and alert, she answers questions without evidence of aphasia and is oriented.   Psychiatric:        Mood and Affect: Mood normal.        Behavior: Behavior normal.     ED Results / Procedures / Treatments   Labs (all labs ordered are listed, but only abnormal results are displayed) Labs Reviewed  CBC - Abnormal; Notable for the following components:      Result Value   WBC 11.3 (*)    RBC 3.73 (*)    Hemoglobin 10.9 (*)    HCT 33.7 (*)    All other components within normal limits  DIFFERENTIAL - Abnormal; Notable for the following components:   Neutro Abs 8.1 (*)    All other components within normal limits  COMPREHENSIVE METABOLIC PANEL - Abnormal; Notable for the following components:   Glucose, Bld 113 (*)    Albumin 2.4 (*)    All other components within normal limits   PROTIME-INR  APTT  MAGNESIUM  URINALYSIS, ROUTINE W REFLEX MICROSCOPIC  HEPARIN LEVEL (UNFRACTIONATED)  TROPONIN I (HIGH SENSITIVITY)  TROPONIN I (HIGH SENSITIVITY)    EKG EKG Interpretation  Date/Time:  Sunday May 04 2022 13:32:53 EDT Ventricular Rate:  94 PR Interval:  126 QRS Duration: 96 QT Interval:  366 QTC Calculation: 457 R Axis:   50 Text Interpretation: Normal sinus rhythm Marked ST abnormality, possible inferior subendocardial injury Abnormal ECG When compared with ECG of 19-Jul-2021 06:21, PREVIOUS ECG IS PRESENT Since last tracing rate  faster Confirmed by Jacalyn Lefevre [phone]) on 05/04/2022 3:08:57 PM  Radiology MR BRAIN WO CONTRAST  Result Date: 05/04/2022 CLINICAL DATA:  Initial evaluation for neuro deficit, stroke suspected. EXAM: MRI HEAD WITHOUT CONTRAST TECHNIQUE: Multiplanar, multiecho pulse sequences of the brain and surrounding structures were obtained without intravenous contrast. COMPARISON:  Prior CT from earlier the same day. FINDINGS: Brain: Generalized age-related cerebral atrophy. Patchy and confluent T2/FLAIR hyperintensity involving the periventricular and deep white matter both cerebral hemispheres, most consistent with chronic small vessel ischemic disease, moderate to advanced in nature. Patchy involvement of the pons noted. Multiple scattered superimposed remote lacunar infarcts present about the hemispheric cerebral white matter, thalamocapsular regions, and pons. Remote right frontal cortical infarct noted. Few scattered foci of chronic hemosiderin staining noted about these infarcts. Evidence for wallerian degeneration at the right cerebral peduncle. Restricted diffusion involving the right insula and overlying right frontal lobe, consistent with acute right MCA distribution infarct (series 2, image 29). No associated hemorrhage or mass effect. No other evidence for acute or subacute ischemia. Gray-white matter differentiation otherwise maintained. No  acute intracranial hemorrhage. No mass lesion, midline shift or mass effect. Ventricular prominence related global parenchymal volume loss without hydrocephalus. No extra-axial fluid collection. Empty sella noted. Vascular: Major intracranial vascular flow voids are maintained. Skull and upper cervical spine: Craniocervical junction within normal limits. Moderate spondylosis noted at C5-6. Bone marrow signal intensity normal. No scalp soft tissue abnormality. Sinuses/Orbits: Globes and orbital soft tissues within normal limits. Mild scattered mucosal thickening noted about the ethmoidal air cells and maxillary sinuses. Trace right mastoid effusion, of doubtful significance. Other: None. IMPRESSION: 1. Acute right MCA distribution infarct involving the right insula and overlying right frontal lobe. No associated hemorrhage or mass effect. 2. Underlying age-related cerebral atrophy with moderate to advanced chronic microvascular ischemic disease, with multiple remote infarcts as above. Electronically Signed   By: Rise Mu M.D.   On: 05/04/2022 21:48   CT Chest W Contrast  Result Date: 05/04/2022 CLINICAL DATA:  Respiratory illness. EXAM: CT CHEST WITH CONTRAST TECHNIQUE: Multidetector CT imaging of the chest was performed during intravenous contrast administration. RADIATION DOSE REDUCTION: This exam was performed according to the departmental dose-optimization program which includes automated exposure control, adjustment of the mA and/or kV according to patient size and/or use of iterative reconstruction technique. CONTRAST:  72mL OMNIPAQUE IOHEXOL 300 MG/ML  SOLN COMPARISON:  Chest radiograph dated 05/04/2022. FINDINGS: Cardiovascular: There is no cardiomegaly or pericardial effusion. Advanced 3 vessel coronary vascular calcification. Mild atherosclerotic calcification of the thoracic aorta. No aneurysmal dilatation. The origins of the great vessels of the aortic arch appear patent. Evaluation of the  pulmonary arteries is limited there is a timing of the contrast and suboptimal opacification. However, a large bilateral central pulmonary artery emboli extending into the lobar branches noted. No evidence of right heart straining. Mediastinum/Nodes: No hilar or mediastinal adenopathy. The esophagus is grossly unremarkable. There is a 13 mm right thyroid hypodense nodule. In the setting of significant comorbidities or limited life expectancy, no follow-up recommended (ref: J Am Coll Radiol. 2015 Feb;12(2): 143-50).No mediastinal fluid collection. Lungs/Pleura: Trace left pleural effusion. There are bibasilar dependent atelectasis. A somewhat wedge shaped pleural based ground-glass area at the left lung base may represent atelectasis. Developing infarct is not excluded. There is no pneumothorax. The central airways are patent. Upper Abdomen: Indeterminate 2 x 2 cm enhancing area in the posterior right lobe of the liver, not characterized on this CT but may represent a hemangioma  or vascular shunting. This can be better evaluated with MRI or ultrasound on a nonemergent/outpatient basis. Partially visualized gallbladder with possible small pericholecystic fluid or small stone versus calcification. Right upper quadrant ultrasound may provide better evaluation. Small bilateral renal hypodense lesions are not characterized. Indeterminate 2.5 x 1.2 cm fluid attenuating structure in the left upper abdomen (123/3). Evaluation of the upper abdominal structures is very limited due to respiratory motion and partial visualization. Musculoskeletal: Osteopenia with degenerative changes of spine. No acute osseous pathology. IMPRESSION: 1. Large bilateral central pulmonary artery emboli extending into the lobar branches. No evidence of right heart straining. 2. Trace left pleural effusion with bibasilar dependent atelectasis. Developing infarct is not excluded. 3. Other nonemergent findings as above. 4. Aortic Atherosclerosis  (ICD10-I70.0). These results were called by telephone at the time of interpretation on 05/04/2022 at 8:00 pm to provider [facility] , who verbally acknowledged these results. Electronically Signed   By: Elgie Collard M.D.   On: 05/04/2022 20:01   CT HEAD WO CONTRAST  Result Date: 05/04/2022 CLINICAL DATA:  Patient recently discharged after being hospitalized for stroke. New difficulty swallowing this morning. EXAM: CT HEAD WITHOUT CONTRAST TECHNIQUE: Contiguous axial images were obtained from the base of the skull through the vertex without intravenous contrast. RADIATION DOSE REDUCTION: This exam was performed according to the departmental dose-optimization program which includes automated exposure control, adjustment of the mA and/or kV according to patient size and/or use of iterative reconstruction technique. COMPARISON:  CT head 07/19/2021. FINDINGS: Brain: New small ill-defined hypodensity at the right cerebral peduncle (series 3, image 15). No evidence of acute hemorrhage, hydrocephalus, extra-axial collection or mass lesion/mass effect. Chronic infarcts noted in the anterior and mid right frontal lobe as well as the left aspect of the pons. Vascular: Vascular calcifications at the skull base. No hyperdense vessel or unexpected calcification. Skull: Normal. Negative for fracture or focal lesion. Sinuses/Orbits: No acute finding. Trace mucosal thickening in the left maxillary sinus. Hypoplastic left frontal sinus. Other: None. IMPRESSION: 1. New small ill-defined hypodensity at the right cerebral peduncle may reflect acute infarct. Consider further evaluation with MRI. 2. Chronic infarcts in the right frontal lobe and left pons. Electronically Signed   By: Sherron Ales M.D.   On: 05/04/2022 16:32   DG Chest 2 View  Result Date: 05/04/2022 CLINICAL DATA:  Difficulty swelling. Unable to lift left arm. EXAM: CHEST - 2 VIEW COMPARISON:  Two-view chest x-ray 01/21/2017 FINDINGS: Heart size normal.  Atherosclerotic calcifications are present at the aortic arch. Small bilateral pleural effusions are present. Mild left greater than right basilar airspace opacities are noted. The upper lung fields are clear. Visualized soft tissues and bony thorax are otherwise unremarkable. IMPRESSION: 1. Small bilateral pleural effusions. 2. Mild left greater than right basilar airspace disease likely reflects atelectasis. Infection or aspiration is not excluded. 3. Aortic atherosclerosis. Electronically Signed   By: Marin Roberts M.D.   On: 05/04/2022 16:12    Procedures .Critical Care  Performed by: Cristina Gong, PA-C Authorized by: Cristina Gong, PA-C   Critical care provider statement:    Critical care time (minutes):  75   Critical care time was exclusive of:  Separately billable procedures and treating other patients and teaching time   Critical care was necessary to treat or prevent imminent or life-threatening deterioration of the following conditions:  Cardiac failure, circulatory failure and CNS failure or compromise   Critical care was time spent personally by me on the following activities:  Development of treatment plan with patient or surrogate, discussions with consultants, evaluation of patient's response to treatment, examination of patient, ordering and review of laboratory studies, ordering and review of radiographic studies, ordering and performing treatments and interventions, pulse oximetry, re-evaluation of patient's condition, review of old charts and obtaining history from patient or surrogate   I assumed direction of critical care for this patient from another provider in my specialty: no     Care discussed with: admitting provider   Comments:     Stroke, Multiple PE, risks of acute stroke and heparinization     Medications Ordered in ED Medications  sodium chloride flush (NS) 0.9 % injection 3 mL (3 mLs Intravenous Not Given 05/04/22 1502)  heparin ADULT  infusion 100 units/mL (25000 units/243mL) (has no administration in time range)  heparin bolus via infusion 4,000 Units (has no administration in time range)  iohexol (OMNIPAQUE) 300 MG/ML solution 75 mL (75 mLs Intravenous Contrast Given 05/04/22 1946)    ED Course/ Medical Decision Making/ A&P Clinical Course as of 05/04/22 2228  Sun May 04, 2022  1705 As patient's symptoms have been present for more than 12 hours she does not require constant neurochecks for the first 12 hours.  [EH]  2127 After my self, Dr. Particia Nearing and Dr. Otelia Limes discussed patient given her large Pes in the setting of possible acute stroke while there is a risk of prior to conversion, however per Dr. Otelia Limes this would most likely be petechial/small hemorrhage and either a 1 in 10-1 in 20 chance. Given the very large PEs she has on her scan I suspect that if she is not appropriately anticoagulated the PEs have a very high chance of being fatal.  I had a extensive discussion with patient, her grandson, and her daughter whom she lives with who is at bedside. Patient is awake and alert and oriented. We discussed at length the abnormal MRI, her obvious PEs, risks of anticoagulation in the setting of abnormal MRI versus the risk of withholding anticoagulation in the setting of multiple PEs. Patient adamantly wishes for anticoagulation at this time. Patient's daughter is in agreement. Option was given for both patient and her daughter to ask any questions which were all answered to the best of my ability. We will order heparin and initiate anticoagulation. [EH]  2207 Patient's other 2 children returned.  I again had a extensive discussion with patient, and her 3 children, and her grandson about her imaging results, we discussed at length goals of anticoagulation, risks of anticoagulation versus not anticoagulating. I discussed that patient has stated she wishes for anticoagulation, and all 3 of patient's children are in agreement  with this decision. I again offered them the opportunity to ask any questions that they may have, all of which were answered to the best of my ability based on information I currently have available.  [EH]  2214 CBC(!) Mild anemia and leukocytosis. [EH]  2215 Comprehensive metabolic panel(!) No significant electrolyte derangements.  Mild hypoalbuminemia. [EH]  2215 Initial opponent is 17, delta is 14, patient is not complaining of chest pain, cough, no shortness of breath. [EH]  2215 DG Chest 2 View Small bilateral pleural effusions, atelectasis versus aspiration [EH]  2216 CT HEAD WO CONTRAST New hypodensity in the right cerebral peduncle with recommendation for MRI [EH]  2216 CT Chest W Contrast Per radiologist has large bilateral central PE extending into lobar branches without evidence of right heart strain [EH]  2217 MR BRAIN WO CONTRAST Right  MCA infarct.  Per Dr. Otelia LimesLindzen possibility of edema secondary to subclinical seizures. [EH]    Clinical Course User Index [EH] Cristina GongHammond, Gailene Youkhana W, PA-C                           Medical Decision Making Is an 88 year old woman who presents today with her family for evaluation of slurred speech. She was recently admitted at outside hospital for headaches and hypokalemia according to family. We attempted to request records from outside hospital however were unable to get these obtained.  Patient's last known baseline was last night when she went to bed, and therefore is outside of any tPA window therefore code stroke was not called.  Additionally she does not meet acute LVO criteria with her prior neurologic deficits from previous stroke and her new deficit being difficulty swallowing.  Radiology was obtained, please see ED course for further details.  In short chest x-ray was obtained due to potential for aspiration showing aspiration versus atelectasis.  CT of the chest was obtained showing multiple large proximal PEs without clear evidence  of cor pulmonale.  CT of the brain was obtained which was abnormal. MRI of the brain was obtained showing right MCA infarct.  Neurology is consulted.  Extensive time was spent counseling both patient and her family on the risks of anticoagulation versus withholding anticoagulation in the setting of combination of multiple large PEs and a acute stroke.  Patient wishes for anticoagulation and her 3 children are in agreement. Anticoagulation is ordered after speaking with neurology in agreement with my supervising physician.  I spoke with hospitalist who will see patient for admission.  Patient was reevaluated multiple times while in my care without significant progression or worsening of her symptoms.   Problems Addressed: Cerebrovascular accident (CVA), unspecified mechanism (HCC): acute illness or injury that poses a threat to life or bodily functions Dysphagia, unspecified type: chronic illness or injury with exacerbation, progression, or side effects of treatment Other pulmonary embolism without acute cor pulmonale, unspecified chronicity (HCC): complicated acute illness or injury that poses a threat to life or bodily functions    Details: Complicated by requiring anticoagulation in the setting of an acute stroke  Amount and/or Complexity of Data Reviewed Independent Historian: caregiver    Details: Patient's 3 children External Data Reviewed: notes. Labs: ordered. Decision-making details documented in ED Course. Radiology: ordered and independent interpretation performed. Decision-making details documented in ED Course. ECG/medicine tests: ordered. Discussion of management or test interpretation with external provider(s): See ED course. Additionally discussed patient with Dr. Cyndia Bentu, admitting doctor, Dr. Otelia LimesLindzen with neurology, and the radiologist.  Risk Prescription drug management. Drug therapy requiring intensive monitoring for toxicity. Decision regarding  hospitalization. Risk Details: Multiple large PEs per radiologist.  These require anticoagulation, elsewise patient would have a very high risk of morbidity and mortality. Anticoagulation is risky in the setting of acute stroke. I had extensive discussion with patient and her 3 children regarding these risks including but not limited to hemorrhagic conversion, death, disability that may be severe, in addition to the general risks of anticoagulation versus the risks of withholding anticoagulation.  Critical Care Total time providing critical care: 75 minutes   The patient appears reasonably stabilized for admission considering the current resources, flow, and capabilities available in the ED at this time, and I doubt any other [facility] requiring further screening and/or treatment in the ED prior to admission assuming timely admission and bed placement.  Note:  Portions of this report may have been transcribed using voice recognition software. Every effort was made to ensure accuracy; however, inadvertent computerized transcription errors may be present         Final Clinical Impression(s) / ED Diagnoses Final diagnoses:  Cerebrovascular accident (CVA), unspecified mechanism (HCC)  Dysphagia, unspecified type  Other pulmonary embolism without acute cor pulmonale, unspecified chronicity [facility])    Rx / DC Orders ED Discharge Orders     None         Norman Clay 05/04/22 2229    Jacalyn Lefevre, MD 05/05/22 1815

## 2022-05-04 NOTE — ED Notes (Signed)
Attempted to get peripheral IV at this time x2 unsuccessful attempts IV team consult placed

## 2022-05-04 NOTE — ED Notes (Signed)
Patient at MRI at this time.

## 2022-05-04 NOTE — Assessment & Plan Note (Signed)
-   obtain CTA head and neck - echocardiogram  -stat EEG also ordered by neurology -Obtain A1c and lipids -PT/OT/SLT -Frequent neuro checks and keep on telemetry -Allow for permissive hypertension with blood pressure treatment as needed only if systolic goes above 220 -pt is on IV heparin for new pulmonary embolism and also has endorsed recent potential GI bleed.  Risk outweighs benefits of starting antiplatelet therapy while also on heparin-appreciate further recommendation from neurology

## 2022-05-05 ENCOUNTER — Observation Stay (HOSPITAL_COMMUNITY): Payer: Medicare Other

## 2022-05-05 ENCOUNTER — Inpatient Hospital Stay (HOSPITAL_COMMUNITY): Payer: Medicare Other

## 2022-05-05 DIAGNOSIS — R569 Unspecified convulsions: Secondary | ICD-10-CM | POA: Diagnosis not present

## 2022-05-05 DIAGNOSIS — R29714 NIHSS score 14: Secondary | ICD-10-CM | POA: Diagnosis present

## 2022-05-05 DIAGNOSIS — I2510 Atherosclerotic heart disease of native coronary artery without angina pectoris: Secondary | ICD-10-CM | POA: Diagnosis present

## 2022-05-05 DIAGNOSIS — R531 Weakness: Secondary | ICD-10-CM | POA: Diagnosis not present

## 2022-05-05 DIAGNOSIS — E119 Type 2 diabetes mellitus without complications: Secondary | ICD-10-CM | POA: Diagnosis present

## 2022-05-05 DIAGNOSIS — R4182 Altered mental status, unspecified: Secondary | ICD-10-CM | POA: Diagnosis not present

## 2022-05-05 DIAGNOSIS — R4781 Slurred speech: Secondary | ICD-10-CM | POA: Diagnosis present

## 2022-05-05 DIAGNOSIS — I82411 Acute embolism and thrombosis of right femoral vein: Secondary | ICD-10-CM | POA: Diagnosis present

## 2022-05-05 DIAGNOSIS — E7800 Pure hypercholesterolemia, unspecified: Secondary | ICD-10-CM | POA: Diagnosis present

## 2022-05-05 DIAGNOSIS — Z7401 Bed confinement status: Secondary | ICD-10-CM | POA: Diagnosis not present

## 2022-05-05 DIAGNOSIS — R609 Edema, unspecified: Secondary | ICD-10-CM | POA: Diagnosis not present

## 2022-05-05 DIAGNOSIS — K219 Gastro-esophageal reflux disease without esophagitis: Secondary | ICD-10-CM | POA: Diagnosis present

## 2022-05-05 DIAGNOSIS — I639 Cerebral infarction, unspecified: Secondary | ICD-10-CM | POA: Diagnosis not present

## 2022-05-05 DIAGNOSIS — E876 Hypokalemia: Secondary | ICD-10-CM | POA: Diagnosis present

## 2022-05-05 DIAGNOSIS — I10 Essential (primary) hypertension: Secondary | ICD-10-CM | POA: Diagnosis not present

## 2022-05-05 DIAGNOSIS — D62 Acute posthemorrhagic anemia: Secondary | ICD-10-CM | POA: Diagnosis present

## 2022-05-05 DIAGNOSIS — I5032 Chronic diastolic (congestive) heart failure: Secondary | ICD-10-CM | POA: Diagnosis present

## 2022-05-05 DIAGNOSIS — I6389 Other cerebral infarction: Secondary | ICD-10-CM | POA: Diagnosis not present

## 2022-05-05 DIAGNOSIS — I69354 Hemiplegia and hemiparesis following cerebral infarction affecting left non-dominant side: Secondary | ICD-10-CM | POA: Diagnosis not present

## 2022-05-05 DIAGNOSIS — R1310 Dysphagia, unspecified: Secondary | ICD-10-CM | POA: Diagnosis present

## 2022-05-05 DIAGNOSIS — R471 Dysarthria and anarthria: Secondary | ICD-10-CM | POA: Diagnosis present

## 2022-05-05 DIAGNOSIS — R29810 Facial weakness: Secondary | ICD-10-CM | POA: Diagnosis present

## 2022-05-05 DIAGNOSIS — I63511 Cerebral infarction due to unspecified occlusion or stenosis of right middle cerebral artery: Secondary | ICD-10-CM | POA: Diagnosis present

## 2022-05-05 DIAGNOSIS — K5900 Constipation, unspecified: Secondary | ICD-10-CM | POA: Diagnosis present

## 2022-05-05 DIAGNOSIS — Z951 Presence of aortocoronary bypass graft: Secondary | ICD-10-CM | POA: Diagnosis not present

## 2022-05-05 DIAGNOSIS — Z515 Encounter for palliative care: Secondary | ICD-10-CM | POA: Diagnosis not present

## 2022-05-05 DIAGNOSIS — I252 Old myocardial infarction: Secondary | ICD-10-CM | POA: Diagnosis not present

## 2022-05-05 DIAGNOSIS — I2699 Other pulmonary embolism without acute cor pulmonale: Secondary | ICD-10-CM | POA: Diagnosis present

## 2022-05-05 DIAGNOSIS — E039 Hypothyroidism, unspecified: Secondary | ICD-10-CM | POA: Diagnosis present

## 2022-05-05 DIAGNOSIS — I110 Hypertensive heart disease with heart failure: Secondary | ICD-10-CM | POA: Diagnosis present

## 2022-05-05 DIAGNOSIS — Z66 Do not resuscitate: Secondary | ICD-10-CM | POA: Diagnosis present

## 2022-05-05 DIAGNOSIS — D649 Anemia, unspecified: Secondary | ICD-10-CM | POA: Diagnosis not present

## 2022-05-05 LAB — URINALYSIS, ROUTINE W REFLEX MICROSCOPIC
Bacteria, UA: NONE SEEN
Bilirubin Urine: NEGATIVE
Glucose, UA: NEGATIVE mg/dL
Ketones, ur: 5 mg/dL — AB
Leukocytes,Ua: NEGATIVE
Nitrite: NEGATIVE
Protein, ur: 30 mg/dL — AB
Specific Gravity, Urine: 1.031 — ABNORMAL HIGH (ref 1.005–1.030)
pH: 5 (ref 5.0–8.0)

## 2022-05-05 LAB — HEPARIN LEVEL (UNFRACTIONATED)
Heparin Unfractionated: 1.07 IU/mL — ABNORMAL HIGH (ref 0.30–0.70)
Heparin Unfractionated: 0.71 IU/mL — ABNORMAL HIGH (ref 0.30–0.70)

## 2022-05-05 LAB — ECHOCARDIOGRAM COMPLETE
AR max vel: 2.66 cm2
AV Area VTI: 2.67 cm2
AV Area mean vel: 2.77 cm2
AV Mean grad: 3 mmHg
AV Peak grad: 6.7 mmHg
Ao pk vel: 1.29 m/s
Area-P 1/2: 2.42 cm2
Height: 61 in
S' Lateral: 2.6 cm
Weight: 2352.75 oz

## 2022-05-05 LAB — LIPID PANEL
Cholesterol: 87 mg/dL (ref 0–200)
HDL: 33 mg/dL — ABNORMAL LOW (ref >40)
LDL Cholesterol: 42 mg/dL (ref 0–99)
Total CHOL/HDL Ratio: 2.6 RATIO
Triglycerides: 59 mg/dL (ref <150)
VLDL: 12 mg/dL (ref 0–40)

## 2022-05-05 LAB — CBC
HCT: 30.3 % — ABNORMAL LOW (ref 36.0–46.0)
Hemoglobin: 9.8 g/dL — ABNORMAL LOW (ref 12.0–15.0)
MCH: 28.7 pg (ref 26.0–34.0)
MCHC: 32.3 g/dL (ref 30.0–36.0)
MCV: 88.9 fL (ref 80.0–100.0)
Platelets: 231 10*3/uL (ref 150–400)
RBC: 3.41 MIL/uL — ABNORMAL LOW (ref 3.87–5.11)
RDW: 14.6 % (ref 11.5–15.5)
WBC: 10.9 10*3/uL — ABNORMAL HIGH (ref 4.0–10.5)
nRBC: 0 % (ref 0.0–0.2)

## 2022-05-05 LAB — HEMOGLOBIN A1C
Hgb A1c MFr Bld: 6 % — ABNORMAL HIGH (ref 4.8–5.6)
Mean Plasma Glucose: 125.5 mg/dL

## 2022-05-05 LAB — GLUCOSE, CAPILLARY: Glucose-Capillary: 76 mg/dL (ref 70–99)

## 2022-05-05 MED ORDER — HEPARIN (PORCINE) 25000 UT/250ML-% IV SOLN
800.0000 [IU]/h | INTRAVENOUS | Status: DC
  Administered 2022-05-05: 800 [IU]/h via INTRAVENOUS
  Administered 2022-05-05: 700 [IU]/h via INTRAVENOUS
  Administered 2022-05-09: 800 [IU]/h via INTRAVENOUS
  Filled 2022-05-05 (×4): qty 250

## 2022-05-05 MED ORDER — ORAL CARE MOUTH RINSE
15.0000 mL | OROMUCOSAL | Status: DC
  Administered 2022-05-05 – 2022-05-12 (×30): 15 mL via OROMUCOSAL

## 2022-05-05 MED ORDER — ORAL CARE MOUTH RINSE
15.0000 mL | OROMUCOSAL | Status: DC | PRN
Start: 2022-05-05 — End: 2022-05-13

## 2022-05-05 MED ORDER — SODIUM CHLORIDE 0.9 % IV SOLN
INTRAVENOUS | Status: DC
Start: 2022-05-05 — End: 2022-05-07

## 2022-05-05 MED ORDER — IOHEXOL 350 MG/ML SOLN
60.0000 mL | Freq: Once | INTRAVENOUS | Status: AC | PRN
  Administered 2022-05-05: 60 mL via INTRAVENOUS

## 2022-05-05 NOTE — Progress Notes (Signed)
ANTICOAGULATION CONSULT NOTE - Follow Up Consult  Pharmacy Consult for Heparin Indication: pulmonary embolus  No Known Allergies  Patient Measurements: Height: 5\' 1"  (154.9 cm) Weight: 66.7 kg (147 lb 0.8 oz) IBW/kg (Calculated) : 47.8 Heparin Dosing Weight: 61.8 kg  Vital Signs: Temp: 98.8 F (37.1 C) (07/31 1610) Temp Source: Oral (07/31 1610) BP: 140/64 (07/31 1610) Pulse Rate: 69 (07/31 1610)  Labs: Recent Labs    05/04/22 1359 05/04/22 1421 05/04/22 1949 05/05/22 0328 05/05/22 0701 05/05/22 1552  HGB 10.9*  --   --  9.8*  --   --   HCT 33.7*  --   --  30.3*  --   --   PLT 250  --   --  231  --   --   APTT 35  --   --   --   --   --   LABPROT 14.8  --   --   --   --   --   INR 1.2  --   --   --   --   --   HEPARINUNFRC  --   --   --   --  1.07* 0.71*  CREATININE 0.81  --   --   --   --   --   TROPONINIHS  --  17 14  --   --   --     Estimated Creatinine Clearance: 43.6 mL/min (by C-G formula based on SCr of 0.81 mg/dL).  Assessment: 89 yof with a history of CAD, GERD, HTN, hypothyroidism, MI. Patient is presenting with weakness and dysphagia. Heparin per pharmacy consult placed for pulmonary embolus.   CT Head w/ new small ill-defined hypodensity at the right cerebral peduncle may reflect acute infarct.   CT Chest with large bilateral central PE extending into lobar branches no RHS on CT   Per Neurology, use PE goal heparin level.   Patient is not on anticoagulation prior to arrival.   - Heparin level was supratherapeutic this morning (1.07) on 1000 units/hr. Drip was held for 1 hour then resumed at 800 units/hr. Heparin level  0.71 about 6 hours after resuming, just above target range.  Hgb 10.9>9.8, platelet count 250>231. No bleeding reported.   Goal of Therapy:  Heparin level 0.3-0.7 units/ml Monitor platelets by anticoagulation protocol: Yes   Plan:  Reduce heparin infusion rate to 700 units/hr Next heparin level and CBC in am. Monitor for  signs/symptoms of bleeding.  88. RPh 05/05/2022,7:47 PM

## 2022-05-05 NOTE — Hospital Course (Signed)
88 y.o. female with medical history significant of stroke on aspirin and Plavix left-sided hemiparesis, CABG, hypertension, chronic diastolic heart failure, and hypothyroidism who presents with concerns of dysphagia.   Patient was recently hospitalized at [facility] overnight about 2 days ago.  Unfortunately unable to see records.  She presented there with a headache and reportedly was admitted for hypokalemia.  Family believes a CT head scan was done and her headache also resolved at discharge.  The night following discharge she was doing well and able to swallow her pills.  Then this morning when she woke up daughter noticed she was drooling all over when she attempted to take her pills.  Then again when she tried eating breakfast prompting daughter to take her to the ED.  Patient denies any headache or blurry vision.  No chest pain, shortness of breath.  No nausea, vomiting. Daughter also notes that about 3 weeks ago she was prescribed potassium supplementation and had persistent dark-colored diarrhea.  This resolved intermittently in terms of both diarrhea and her stool returning to brown color, but again about 6 days ago there was another episode of dark stool.  She is on aspirin and Plavix for her CVA   In the ED, she was afebrile and normotensive on room air. Had mild leukocytosis of 11.3, hemoglobin 10.9 which appears to be downward trending 12.8 back in 06/2021.  No other significant electrolyte abnormalities.   EKG my review sinus rhythm and slight ST depression in V4 to V6 which is also seen on prior EKGs.     CT head with new small ill-defined hypodensity at the right cerebral peduncle. MRI brain revealing for acute right MCA distribution infarct involving the right insula and overlying the right frontal lobe.   CT chest with large bilateral central pulmonary artery emboli extending into the lobar branch with no evidence of right heart strain and evidence of right heart strain.  Trace  left pleural effusion with developing infarct not excluded.   ED PA discussed with neurology Dr. Otelia Limes and after extensive discussion with him and with patient and her family.  They have decided to initiate IV heparin for a PE in the setting of new stroke.  Hospitalist then consult for admission.

## 2022-05-05 NOTE — Progress Notes (Signed)
PROGRESS NOTE   [name] [name]  XVQ:[id number] DOB: March 09, 1934 DOA: 05/04/2022 PCP: Oneal Grout, FNP   Chief Complaint  Patient presents with   Weakness   Dysphagia   Level of care: Progressive  Brief Admission History:  88 y.o. female with medical history significant of stroke on aspirin and Plavix left-sided hemiparesis, CABG, hypertension, chronic diastolic heart failure, and hypothyroidism who presents with concerns of dysphagia.   Patient was recently hospitalized at [facility] overnight about 2 days ago.  Unfortunately unable to see records.  She presented there with a headache and reportedly was admitted for hypokalemia.  Family believes a CT head scan was done and her headache also resolved at discharge.  The night following discharge she was doing well and able to swallow her pills.  Then this morning when she woke up daughter noticed she was drooling all over when she attempted to take her pills.  Then again when she tried eating breakfast prompting daughter to take her to the ED.  Patient denies any headache or blurry vision.  No chest pain, shortness of breath.  No nausea, vomiting. Daughter also notes that about 3 weeks ago she was prescribed potassium supplementation and had persistent dark-colored diarrhea.  This resolved intermittently in terms of both diarrhea and her stool returning to brown color, but again about 6 days ago there was another episode of dark stool.  She is on aspirin and Plavix for her CVA   In the ED, she was afebrile and normotensive on room air. Had mild leukocytosis of 11.3, hemoglobin 10.9 which appears to be downward trending 12.8 back in 06/2021.  No other significant electrolyte abnormalities.   EKG my review sinus rhythm and slight ST depression in V4 to V6 which is also seen on prior EKGs.     CT head with new small ill-defined hypodensity at the right cerebral peduncle. MRI brain revealing for acute right MCA distribution infarct  involving the right insula and overlying the right frontal lobe.   CT chest with large bilateral central pulmonary artery emboli extending into the lobar branch with no evidence of right heart strain and evidence of right heart strain.  Trace left pleural effusion with developing infarct not excluded.   ED PA discussed with neurology Dr. Otelia Limes and after extensive discussion with him and with patient and her family.  They have decided to initiate IV heparin for a PE in the setting of new stroke.  Hospitalist then consult for admission.   Assessment and Plan: * Acute right MCA stroke (HCC) - obtain CTA head and neck - echocardiogram  -stat EEG also ordered by neurology -Obtain A1c and lipids -PT/OT/SLT -Frequent neuro checks and keep on telemetry -Allow for permissive hypertension with blood pressure treatment as needed only if systolic goes above 220 -pt is on IV heparin for new pulmonary embolism and also has endorsed recent potential GI bleed.  Risk outweighs benefits of starting antiplatelet therapy while also on heparin-appreciate further recommendation from neurology   Anemia hemoglobin 10.9 which appears to be downward trending from 12.8 back in 06/2021 -reports intermittent dark stool for the past few weeks. Last episode was about 6 days ago. Check FOBT.  Unfortunately she will need to be on anticoagulation for a substantial pulmonary embolism. Will need to keep close monitoring of her Hgb while on IV heparin. Family is aware of the potential risk of recurrent GI bleed while on heparin.  Acute pulmonary embolism (HCC) -CT chest with large bilateral central  pulmonary artery emboli extending into the lobar branch with no evidence of right heart strain and evidence of right heart strain.  Trace left pleural effusion with developing infarct not excluded. -Suspect due to patient being bedbound due to her previous stroke resulting in left-sided hemiparesis  -ED PA had extensive discussion  with neurology and with patient's family.  Family has decided to proceed with heparinization which they confirmed with me as well knowing the benefits and risk of hemorrhagic conversion in the setting of a new stroke. -Continue IV heparin per neurology team     Hypothyroidism Resume levothyroxine pending SLP evaluation  Slurred speech --SLP evaluation requested, likely resultant from prior CVAs and acute insult  Chronic diastolic heart failure (HCC) Stable and compensated  Essential hypertension Allowing for permissive hypertension due to new right MCA stroke   DVT prophylaxis: IV heparin  Code Status: DNR  Family Communication: daughters Disposition: Status is: Observation The patient remains OBS appropriate and will d/c before 2 midnights.   Consultants:  Neurology service  Procedures:   Antimicrobials:    Subjective: Pt reports that she would like to eat something.  She is waiting for SLP evaluation prior to trying something oral.  Objective: Vitals:   05/04/22 2200 05/05/22 0023 05/05/22 0326 05/05/22 0729  BP: 135/76 138/65 132/61 139/68  Pulse: 67 63 64 66  Resp:    16  Temp:  99.1 F (37.3 C) 98.3 F (36.8 C) 98.5 F (36.9 C)  TempSrc:  Oral Oral Oral  SpO2: 99% 99% 99% 100%  Weight:      Height:        Intake/Output Summary (Last 24 hours) at 05/05/2022 1128 Last data filed at 05/05/2022 0600 Gross per 24 hour  Intake 411.08 ml  Output --  Net 411.08 ml   Filed Weights   05/04/22 1505  Weight: 66.7 kg   Examination:  General exam: frail, elderly female, awake, alert, cooperative, mostly nonverbal, Appears calm and comfortable.  Pronounced facial droop.   Respiratory system: Clear to auscultation. Respiratory effort normal. Cardiovascular system: normal S1 & S2 heard. No JVD, murmurs, rubs, gallops or clicks. No pedal edema. Gastrointestinal system: Abdomen is nondistended, soft and nontender. No organomegaly or masses felt. Normal bowel sounds  heard. Central nervous system: somnolent but arousable. Pronounced facial droop.  Extremities: Symmetric 5 x 5 power. Skin: No rashes, lesions or ulcers. Psychiatry: Judgement and insight appear normal. Mood & affect appropriate.   Data Reviewed: I have personally reviewed following labs and imaging studies  CBC: Recent Labs  Lab 05/04/22 1359 05/05/22 0328  WBC 11.3* 10.9*  NEUTROABS 8.1*  --   HGB 10.9* 9.8*  HCT 33.7* 30.3*  MCV 90.3 88.9  PLT 250 231    Basic Metabolic Panel: Recent Labs  Lab 05/04/22 1359 05/04/22 1421  NA 141  --   K 3.9  --   CL 106  --   CO2 28  --   GLUCOSE 113*  --   BUN 8  --   CREATININE 0.81  --   CALCIUM 8.9  --   MG  --  1.9    CBG: No results for input(s): "GLUCAP" in the last 168 hours.  No results found for this or any previous visit (from the past 240 hour(s)).   Radiology Studies: CT ANGIO HEAD NECK W WO CM  Result Date: 05/05/2022 CLINICAL DATA:  Stroke follow-up EXAM: CT ANGIOGRAPHY HEAD AND NECK TECHNIQUE: Multidetector CT imaging of the head and neck  was performed using the standard protocol during bolus administration of intravenous contrast. Multiplanar CT image reconstructions and MIPs were obtained to evaluate the vascular anatomy. Carotid stenosis measurements (when applicable) are obtained utilizing NASCET criteria, using the distal internal carotid diameter as the denominator. RADIATION DOSE REDUCTION: This exam was performed according to the departmental dose-optimization program which includes automated exposure control, adjustment of the mA and/or kV according to patient size and/or use of iterative reconstruction technique. CONTRAST:  64mL OMNIPAQUE IOHEXOL 350 MG/ML SOLN COMPARISON:  Head CT and brain MRI from yesterday FINDINGS: CT HEAD FINDINGS Brain: Known cytotoxic edema in the right frontal cortex from acute infarct. Small remote anterior right frontal and left frontal parietal infarcts. Chronic small vessel  ischemia. Chronic lacunar infarcts in the left pons and right caudate. No acute hemorrhage, hydrocephalus, or collection. Vascular: See below Skull: Normal. Negative for fracture or focal lesion. Sinuses/Orbits: No acute finding. Review of the MIP images confirms the above findings CTA NECK FINDINGS Aortic arch: Partial coverage of the arch shows atherosclerosis without aneurysm. Right carotid system: Mild for age atheromatous plaque at the bifurcation without flow reducing stenosis or ulceration. Left carotid system: Moderate mainly calcified plaque at the bifurcation without flow reducing stenosis. Mixed density plaque at the common carotid. Negative for ulceration or beading Vertebral arteries: No proximal subclavian stenosis or irregularity. The right vertebral artery is dominant. The vertebral arteries are widely patent to the level of the dura. Skeleton: Generalized cervical spine degeneration. No acute or aggressive finding. Other neck: 9 mm right thyroid nodule. No followup recommended (ref: J Am Coll Radiol. 2015 Feb;12(2): 143-50). Upper chest: No acute finding. Review of the MIP images confirms the above findings CTA HEAD FINDINGS Anterior circulation: Confluent atheromatous plaque on the carotid siphons. Atheromatous irregularity of medium size branches which is also generalized. No major branch occlusion or correctable flow limiting stenosis. The affected right MCA branch may be recanalized. Negative for aneurysm Posterior circulation: Dominant right vertebral artery showing calcified plaque. The vertebral and basilar arteries are diffusely patent. Atheromatous irregularity of the posterior cerebral arteries including at least moderate right PCA stenosis before the bifurcation. High-grade narrowing of the left PCA at its bifurcation. Venous sinuses: Patent Anatomic variants: None significant Review of the MIP images confirms the above findings IMPRESSION: Cervical and intracranial atherosclerosis as  described. No flow limiting stenosis or ulceration underlying the acute right MCA territory infarct. Electronically Signed   By: Tiburcio Pea M.D.   On: 05/05/2022 05:01   MR BRAIN WO CONTRAST  Result Date: 05/04/2022 CLINICAL DATA:  Initial evaluation for neuro deficit, stroke suspected. EXAM: MRI HEAD WITHOUT CONTRAST TECHNIQUE: Multiplanar, multiecho pulse sequences of the brain and surrounding structures were obtained without intravenous contrast. COMPARISON:  Prior CT from earlier the same day. FINDINGS: Brain: Generalized age-related cerebral atrophy. Patchy and confluent T2/FLAIR hyperintensity involving the periventricular and deep white matter both cerebral hemispheres, most consistent with chronic small vessel ischemic disease, moderate to advanced in nature. Patchy involvement of the pons noted. Multiple scattered superimposed remote lacunar infarcts present about the hemispheric cerebral white matter, thalamocapsular regions, and pons. Remote right frontal cortical infarct noted. Few scattered foci of chronic hemosiderin staining noted about these infarcts. Evidence for wallerian degeneration at the right cerebral peduncle. Restricted diffusion involving the right insula and overlying right frontal lobe, consistent with acute right MCA distribution infarct (series 2, image 29). No associated hemorrhage or mass effect. No other evidence for acute or subacute ischemia. Gray-white matter differentiation otherwise  maintained. No acute intracranial hemorrhage. No mass lesion, midline shift or mass effect. Ventricular prominence related global parenchymal volume loss without hydrocephalus. No extra-axial fluid collection. Empty sella noted. Vascular: Major intracranial vascular flow voids are maintained. Skull and upper cervical spine: Craniocervical junction within normal limits. Moderate spondylosis noted at C5-6. Bone marrow signal intensity normal. No scalp soft tissue abnormality. Sinuses/Orbits:  Globes and orbital soft tissues within normal limits. Mild scattered mucosal thickening noted about the ethmoidal air cells and maxillary sinuses. Trace right mastoid effusion, of doubtful significance. Other: None. IMPRESSION: 1. Acute right MCA distribution infarct involving the right insula and overlying right frontal lobe. No associated hemorrhage or mass effect. 2. Underlying age-related cerebral atrophy with moderate to advanced chronic microvascular ischemic disease, with multiple remote infarcts as above. Electronically Signed   By: Rise Mu M.D.   On: 05/04/2022 21:48   CT Chest W Contrast  Result Date: 05/04/2022 CLINICAL DATA:  Respiratory illness. EXAM: CT CHEST WITH CONTRAST TECHNIQUE: Multidetector CT imaging of the chest was performed during intravenous contrast administration. RADIATION DOSE REDUCTION: This exam was performed according to the departmental dose-optimization program which includes automated exposure control, adjustment of the mA and/or kV according to patient size and/or use of iterative reconstruction technique. CONTRAST:  21mL OMNIPAQUE IOHEXOL 300 MG/ML  SOLN COMPARISON:  Chest radiograph dated 05/04/2022. FINDINGS: Cardiovascular: There is no cardiomegaly or pericardial effusion. Advanced 3 vessel coronary vascular calcification. Mild atherosclerotic calcification of the thoracic aorta. No aneurysmal dilatation. The origins of the great vessels of the aortic arch appear patent. Evaluation of the pulmonary arteries is limited there is a timing of the contrast and suboptimal opacification. However, a large bilateral central pulmonary artery emboli extending into the lobar branches noted. No evidence of right heart straining. Mediastinum/Nodes: No hilar or mediastinal adenopathy. The esophagus is grossly unremarkable. There is a 13 mm right thyroid hypodense nodule. In the setting of significant comorbidities or limited life expectancy, no follow-up recommended (ref: J  Am Coll Radiol. 2015 Feb;12(2): 143-50).No mediastinal fluid collection. Lungs/Pleura: Trace left pleural effusion. There are bibasilar dependent atelectasis. A somewhat wedge shaped pleural based ground-glass area at the left lung base may represent atelectasis. Developing infarct is not excluded. There is no pneumothorax. The central airways are patent. Upper Abdomen: Indeterminate 2 x 2 cm enhancing area in the posterior right lobe of the liver, not characterized on this CT but may represent a hemangioma or vascular shunting. This can be better evaluated with MRI or ultrasound on a nonemergent/outpatient basis. Partially visualized gallbladder with possible small pericholecystic fluid or small stone versus calcification. Right upper quadrant ultrasound may provide better evaluation. Small bilateral renal hypodense lesions are not characterized. Indeterminate 2.5 x 1.2 cm fluid attenuating structure in the left upper abdomen (123/3). Evaluation of the upper abdominal structures is very limited due to respiratory motion and partial visualization. Musculoskeletal: Osteopenia with degenerative changes of spine. No acute osseous pathology. IMPRESSION: 1. Large bilateral central pulmonary artery emboli extending into the lobar branches. No evidence of right heart straining. 2. Trace left pleural effusion with bibasilar dependent atelectasis. Developing infarct is not excluded. 3. Other nonemergent findings as above. 4. Aortic Atherosclerosis (ICD10-I70.0). These results were called by telephone at the time of interpretation on 05/04/2022 at 8:00 pm to provider [facility] , who verbally acknowledged these results. Electronically Signed   By: Elgie Collard M.D.   On: 05/04/2022 20:01   CT HEAD WO CONTRAST  Result Date: 05/04/2022 CLINICAL DATA:  Patient  recently discharged after being hospitalized for stroke. New difficulty swallowing this morning. EXAM: CT HEAD WITHOUT CONTRAST TECHNIQUE: Contiguous axial  images were obtained from the base of the skull through the vertex without intravenous contrast. RADIATION DOSE REDUCTION: This exam was performed according to the departmental dose-optimization program which includes automated exposure control, adjustment of the mA and/or kV according to patient size and/or use of iterative reconstruction technique. COMPARISON:  CT head 07/19/2021. FINDINGS: Brain: New small ill-defined hypodensity at the right cerebral peduncle (series 3, image 15). No evidence of acute hemorrhage, hydrocephalus, extra-axial collection or mass lesion/mass effect. Chronic infarcts noted in the anterior and mid right frontal lobe as well as the left aspect of the pons. Vascular: Vascular calcifications at the skull base. No hyperdense vessel or unexpected calcification. Skull: Normal. Negative for fracture or focal lesion. Sinuses/Orbits: No acute finding. Trace mucosal thickening in the left maxillary sinus. Hypoplastic left frontal sinus. Other: None. IMPRESSION: 1. New small ill-defined hypodensity at the right cerebral peduncle may reflect acute infarct. Consider further evaluation with MRI. 2. Chronic infarcts in the right frontal lobe and left pons. Electronically Signed   By: Sherron AlesLaura  Parra M.D.   On: 05/04/2022 16:32   DG Chest 2 View  Result Date: 05/04/2022 CLINICAL DATA:  Difficulty swelling. Unable to lift left arm. EXAM: CHEST - 2 VIEW COMPARISON:  Two-view chest x-ray 01/21/2017 FINDINGS: Heart size normal. Atherosclerotic calcifications are present at the aortic arch. Small bilateral pleural effusions are present. Mild left greater than right basilar airspace opacities are noted. The upper lung fields are clear. Visualized soft tissues and bony thorax are otherwise unremarkable. IMPRESSION: 1. Small bilateral pleural effusions. 2. Mild left greater than right basilar airspace disease likely reflects atelectasis. Infection or aspiration is not excluded. 3. Aortic atherosclerosis.  Electronically Signed   By: Marin Robertshristopher  Mattern M.D.   On: 05/04/2022 16:12    Scheduled Meds:  mouth rinse  15 mL Mouth Rinse 4 times per day   sodium chloride flush  3 mL Intravenous Once   Continuous Infusions:  sodium chloride     heparin 800 Units/hr (05/05/22 0947)     LOS: 0 days   Time spent: 36 mins  Deavion Strider Laural BenesJohnson, MD How to contact the [facility] Attending or Consulting provider 7A - 7P or covering provider during after hours 7P -7A, for this patient?  Check the care team in [facility] and look for a) attending/consulting [facility] provider listed and b) the [facility] team listed Log into [email or web address] and use [place]'s universal password to access. If you do not have the password, please contact the hospital operator. Locate the [facility] provider you are looking for under [facility] and page to a number that you can be directly reached. If you still have difficulty reaching the provider, please page the [facility] (Director on Call) for the Hospitalists listed on amion for assistance.  05/05/2022, 11:28 AM

## 2022-05-05 NOTE — Progress Notes (Signed)
Modified Barium Swallow Progress Note  Patient Details  Name: [name] [name] MRN: [id number] Date of Birth: 1934-09-24  Today's Date: 05/05/2022  Modified Barium Swallow completed.  Full report located under Chart Review in the Imaging Section.  Brief recommendations include the following:  Clinical Impression  Pt presents with an oral more than pharyngeal dysphagia. She has incomplete L labial seal that results in significant bolus loss with thin liquids via cup, although with improved closure noted around a straw. Less anterior loss was noted with purees compared to clinical assessment earlier today, but oral residue noted with all consistencies is the most significant with purees. She requires cues to manage oral residue, saying that she does not feel it in her mouth. SLP provided visual feedback as well, allowing pt to watch MBS video in real time to try to facilitate oral clearance. A cued second swallow is generally effective but takes a little extra time to initiate. Pharyngeally she has reduced hyolaryngea movement and pharyngeal squeeze. Visibility throughout study somewhat limited by pt positioning with shadow from shoulder as well as residue from pyriform sinuses superimposed over laryngeal vestibule, although she does appear to have moments of trace penetration from thin liquids during the swallow. Pt also had mild residue at the pyriform sinuses with incomplete entrance into the UES as well as occasional backflow. Retrograde flow also noted during brief esophageal sweep (although could not scan the entire esophagus). Recommend starting Dys 1 diet and thin liquids with full supervision. Would use two swallows per bolus and take one small sip at a time.   Swallow Evaluation Recommendations       SLP Diet Recommendations: Dysphagia 1 (Puree) solids;Thin liquid   Liquid Administration via: Straw   Medication Administration: Crushed with puree   Supervision: Staff to assist with self  feeding;Full supervision/cueing for compensatory strategies   Compensations: Minimize environmental distractions;Slow rate;Small sips/bites;Multiple dry swallows after each bite/sip   Postural Changes: Seated upright at 90 degrees;Remain semi-upright after after feeds/meals (Comment)   Oral Care Recommendations: Oral care BID        Mahala Menghini., M.A. CCC-SLP Acute Rehabilitation Services Office [phone]  Secure chat preferred  05/05/2022,3:04 PM

## 2022-05-05 NOTE — Progress Notes (Addendum)
STROKE TEAM PROGRESS NOTE   INTERVAL HISTORY Patient is seen in her room with her daughter at the bedside.  She will admitted last week to [facility] with right frontal stroke with left hemiparesis and was discharged home the next day developed swallowing difficulties and presented to [facility] with MRI showing acute right frontal MCA branch infarct and CT chest showing bilateral pulmonary emboli for which she has been started on anticoagulation with IV heparin.  Neurological exam remains unchanged.  Vital signs are stable.  Heparin level is slightly supratherapeutic and pharmacy is managing.  EEG shows cortical dysfunction in the right temporal region but no epileptiform activity. Vitals:   05/05/22 0023 05/05/22 0326 05/05/22 0729 05/05/22 1154  BP: 138/65 132/61 139/68 138/63  Pulse: 63 64 66 66  Resp:   16 16  Temp: 99.1 F (37.3 C) 98.3 F (36.8 C) 98.5 F (36.9 C) 98 F (36.7 C)  TempSrc: Oral Oral Oral Oral  SpO2: 99% 99% 100% 100%  Weight:      Height:       CBC:  Recent Labs  Lab 05/04/22 1359 05/05/22 0328  WBC 11.3* 10.9*  NEUTROABS 8.1*  --   HGB 10.9* 9.8*  HCT 33.7* 30.3*  MCV 90.3 88.9  PLT 250 231   Basic Metabolic Panel:  Recent Labs  Lab 05/04/22 1359 05/04/22 1421  NA 141  --   K 3.9  --   CL 106  --   CO2 28  --   GLUCOSE 113*  --   BUN 8  --   CREATININE 0.81  --   CALCIUM 8.9  --   MG  --  1.9   Lipid Panel:  Recent Labs  Lab 05/05/22 0328  CHOL 87  TRIG 59  HDL 33*  CHOLHDL 2.6  VLDL 12  LDLCALC 42   HgbA1c:  Recent Labs  Lab 05/04/22 1359  HGBA1C 6.0*   Urine Drug Screen: No results for input(s): "LABOPIA", "COCAINSCRNUR", "LABBENZ", "AMPHETMU", "THCU", "LABBARB" in the last 168 hours.  Alcohol Level No results for input(s): "ETH" in the last 168 hours.  IMAGING past 24 hours EEG adult  Result Date: 05/05/2022 Charlsie QuestYadav, Priyanka O, MD     05/05/2022 12:45 PM Patient Name: Shelle IronClara G Caterino MRN: [id number] Epilepsy Attending:  Charlsie QuestPriyanka O Yadav Referring Physician/Provider: Caryl PinaLindzen, Eric, MD Date: 05/05/2022 Duration: 22.07 mins Patient history: 88 year old female with right frontal infarct.  EEG to evaluate for stroke. Level of alertness: Awake, drowsy AEDs during EEG study: None Technical aspects: This EEG study was done with scalp electrodes positioned according to the 10-20 International system of electrode placement. Electrical activity was reviewed with band pass filter of 1-70Hz , sensitivity of 7 uV/mm, display speed of 5330mm/sec with a 60Hz  notched filter applied as appropriate. EEG data were recorded continuously and digitally stored.  Video monitoring was available and reviewed as appropriate. Description: The posterior dominant rhythm consists of 8-9 Hz activity of moderate voltage (25-35 uV) seen predominantly in posterior head regions, symmetric and reactive to eye opening and eye closing. Drowsiness was characterized by attenuation of the posterior background rhythm.  EEG showed intermittent 3 to 6 Hz theta-delta slowing in right temporal region.  Hyperventilation and photic stimulation were not performed.   ABNORMALITY - Intermittent slow, right temporal region IMPRESSION: This study is suggestive of cortical dysfunction in right temporal region likely secondary to underlying stroke.  No seizures or epileptiform discharges were seen throughout the recording. Priyanka Annabelle Harman Yadav  CT ANGIO HEAD NECK W WO CM  Result Date: 05/05/2022 CLINICAL DATA:  Stroke follow-up EXAM: CT ANGIOGRAPHY HEAD AND NECK TECHNIQUE: Multidetector CT imaging of the head and neck was performed using the standard protocol during bolus administration of intravenous contrast. Multiplanar CT image reconstructions and MIPs were obtained to evaluate the vascular anatomy. Carotid stenosis measurements (when applicable) are obtained utilizing NASCET criteria, using the distal internal carotid diameter as the denominator. RADIATION DOSE REDUCTION: This exam was  performed according to the departmental dose-optimization program which includes automated exposure control, adjustment of the mA and/or kV according to patient size and/or use of iterative reconstruction technique. CONTRAST:  89mL OMNIPAQUE IOHEXOL 350 MG/ML SOLN COMPARISON:  Head CT and brain MRI from yesterday FINDINGS: CT HEAD FINDINGS Brain: Known cytotoxic edema in the right frontal cortex from acute infarct. Small remote anterior right frontal and left frontal parietal infarcts. Chronic small vessel ischemia. Chronic lacunar infarcts in the left pons and right caudate. No acute hemorrhage, hydrocephalus, or collection. Vascular: See below Skull: Normal. Negative for fracture or focal lesion. Sinuses/Orbits: No acute finding. Review of the MIP images confirms the above findings CTA NECK FINDINGS Aortic arch: Partial coverage of the arch shows atherosclerosis without aneurysm. Right carotid system: Mild for age atheromatous plaque at the bifurcation without flow reducing stenosis or ulceration. Left carotid system: Moderate mainly calcified plaque at the bifurcation without flow reducing stenosis. Mixed density plaque at the common carotid. Negative for ulceration or beading Vertebral arteries: No proximal subclavian stenosis or irregularity. The right vertebral artery is dominant. The vertebral arteries are widely patent to the level of the dura. Skeleton: Generalized cervical spine degeneration. No acute or aggressive finding. Other neck: 9 mm right thyroid nodule. No followup recommended (ref: J Am Coll Radiol. 2015 Feb;12(2): 143-50). Upper chest: No acute finding. Review of the MIP images confirms the above findings CTA HEAD FINDINGS Anterior circulation: Confluent atheromatous plaque on the carotid siphons. Atheromatous irregularity of medium size branches which is also generalized. No major branch occlusion or correctable flow limiting stenosis. The affected right MCA branch may be recanalized. Negative  for aneurysm Posterior circulation: Dominant right vertebral artery showing calcified plaque. The vertebral and basilar arteries are diffusely patent. Atheromatous irregularity of the posterior cerebral arteries including at least moderate right PCA stenosis before the bifurcation. High-grade narrowing of the left PCA at its bifurcation. Venous sinuses: Patent Anatomic variants: None significant Review of the MIP images confirms the above findings IMPRESSION: Cervical and intracranial atherosclerosis as described. No flow limiting stenosis or ulceration underlying the acute right MCA territory infarct. Electronically Signed   By: Tiburcio Pea M.D.   On: 05/05/2022 05:01   MR BRAIN WO CONTRAST  Result Date: 05/04/2022 CLINICAL DATA:  Initial evaluation for neuro deficit, stroke suspected. EXAM: MRI HEAD WITHOUT CONTRAST TECHNIQUE: Multiplanar, multiecho pulse sequences of the brain and surrounding structures were obtained without intravenous contrast. COMPARISON:  Prior CT from earlier the same day. FINDINGS: Brain: Generalized age-related cerebral atrophy. Patchy and confluent T2/FLAIR hyperintensity involving the periventricular and deep white matter both cerebral hemispheres, most consistent with chronic small vessel ischemic disease, moderate to advanced in nature. Patchy involvement of the pons noted. Multiple scattered superimposed remote lacunar infarcts present about the hemispheric cerebral white matter, thalamocapsular regions, and pons. Remote right frontal cortical infarct noted. Few scattered foci of chronic hemosiderin staining noted about these infarcts. Evidence for wallerian degeneration at the right cerebral peduncle. Restricted diffusion involving the right insula and overlying right  frontal lobe, consistent with acute right MCA distribution infarct (series 2, image 29). No associated hemorrhage or mass effect. No other evidence for acute or subacute ischemia. Gray-white matter  differentiation otherwise maintained. No acute intracranial hemorrhage. No mass lesion, midline shift or mass effect. Ventricular prominence related global parenchymal volume loss without hydrocephalus. No extra-axial fluid collection. Empty sella noted. Vascular: Major intracranial vascular flow voids are maintained. Skull and upper cervical spine: Craniocervical junction within normal limits. Moderate spondylosis noted at C5-6. Bone marrow signal intensity normal. No scalp soft tissue abnormality. Sinuses/Orbits: Globes and orbital soft tissues within normal limits. Mild scattered mucosal thickening noted about the ethmoidal air cells and maxillary sinuses. Trace right mastoid effusion, of doubtful significance. Other: None. IMPRESSION: 1. Acute right MCA distribution infarct involving the right insula and overlying right frontal lobe. No associated hemorrhage or mass effect. 2. Underlying age-related cerebral atrophy with moderate to advanced chronic microvascular ischemic disease, with multiple remote infarcts as above. Electronically Signed   By: Rise Mu M.D.   On: 05/04/2022 21:48   CT Chest W Contrast  Result Date: 05/04/2022 CLINICAL DATA:  Respiratory illness. EXAM: CT CHEST WITH CONTRAST TECHNIQUE: Multidetector CT imaging of the chest was performed during intravenous contrast administration. RADIATION DOSE REDUCTION: This exam was performed according to the departmental dose-optimization program which includes automated exposure control, adjustment of the mA and/or kV according to patient size and/or use of iterative reconstruction technique. CONTRAST:  41mL OMNIPAQUE IOHEXOL 300 MG/ML  SOLN COMPARISON:  Chest radiograph dated 05/04/2022. FINDINGS: Cardiovascular: There is no cardiomegaly or pericardial effusion. Advanced 3 vessel coronary vascular calcification. Mild atherosclerotic calcification of the thoracic aorta. No aneurysmal dilatation. The origins of the great vessels of the  aortic arch appear patent. Evaluation of the pulmonary arteries is limited there is a timing of the contrast and suboptimal opacification. However, a large bilateral central pulmonary artery emboli extending into the lobar branches noted. No evidence of right heart straining. Mediastinum/Nodes: No hilar or mediastinal adenopathy. The esophagus is grossly unremarkable. There is a 13 mm right thyroid hypodense nodule. In the setting of significant comorbidities or limited life expectancy, no follow-up recommended (ref: J Am Coll Radiol. 2015 Feb;12(2): 143-50).No mediastinal fluid collection. Lungs/Pleura: Trace left pleural effusion. There are bibasilar dependent atelectasis. A somewhat wedge shaped pleural based ground-glass area at the left lung base may represent atelectasis. Developing infarct is not excluded. There is no pneumothorax. The central airways are patent. Upper Abdomen: Indeterminate 2 x 2 cm enhancing area in the posterior right lobe of the liver, not characterized on this CT but may represent a hemangioma or vascular shunting. This can be better evaluated with MRI or ultrasound on a nonemergent/outpatient basis. Partially visualized gallbladder with possible small pericholecystic fluid or small stone versus calcification. Right upper quadrant ultrasound may provide better evaluation. Small bilateral renal hypodense lesions are not characterized. Indeterminate 2.5 x 1.2 cm fluid attenuating structure in the left upper abdomen (123/3). Evaluation of the upper abdominal structures is very limited due to respiratory motion and partial visualization. Musculoskeletal: Osteopenia with degenerative changes of spine. No acute osseous pathology. IMPRESSION: 1. Large bilateral central pulmonary artery emboli extending into the lobar branches. No evidence of right heart straining. 2. Trace left pleural effusion with bibasilar dependent atelectasis. Developing infarct is not excluded. 3. Other nonemergent  findings as above. 4. Aortic Atherosclerosis (ICD10-I70.0). These results were called by telephone at the time of interpretation on 05/04/2022 at 8:00 pm to provider [facility] , who  verbally acknowledged these results. Electronically Signed   By: Elgie Collard M.D.   On: 05/04/2022 20:01   CT HEAD WO CONTRAST  Result Date: 05/04/2022 CLINICAL DATA:  Patient recently discharged after being hospitalized for stroke. New difficulty swallowing this morning. EXAM: CT HEAD WITHOUT CONTRAST TECHNIQUE: Contiguous axial images were obtained from the base of the skull through the vertex without intravenous contrast. RADIATION DOSE REDUCTION: This exam was performed according to the departmental dose-optimization program which includes automated exposure control, adjustment of the mA and/or kV according to patient size and/or use of iterative reconstruction technique. COMPARISON:  CT head 07/19/2021. FINDINGS: Brain: New small ill-defined hypodensity at the right cerebral peduncle (series 3, image 15). No evidence of acute hemorrhage, hydrocephalus, extra-axial collection or mass lesion/mass effect. Chronic infarcts noted in the anterior and mid right frontal lobe as well as the left aspect of the pons. Vascular: Vascular calcifications at the skull base. No hyperdense vessel or unexpected calcification. Skull: Normal. Negative for fracture or focal lesion. Sinuses/Orbits: No acute finding. Trace mucosal thickening in the left maxillary sinus. Hypoplastic left frontal sinus. Other: None. IMPRESSION: 1. New small ill-defined hypodensity at the right cerebral peduncle may reflect acute infarct. Consider further evaluation with MRI. 2. Chronic infarcts in the right frontal lobe and left pons. Electronically Signed   By: Sherron Ales M.D.   On: 05/04/2022 16:32   DG Chest 2 View  Result Date: 05/04/2022 CLINICAL DATA:  Difficulty swelling. Unable to lift left arm. EXAM: CHEST - 2 VIEW COMPARISON:  Two-view chest  x-ray 01/21/2017 FINDINGS: Heart size normal. Atherosclerotic calcifications are present at the aortic arch. Small bilateral pleural effusions are present. Mild left greater than right basilar airspace opacities are noted. The upper lung fields are clear. Visualized soft tissues and bony thorax are otherwise unremarkable. IMPRESSION: 1. Small bilateral pleural effusions. 2. Mild left greater than right basilar airspace disease likely reflects atelectasis. Infection or aspiration is not excluded. 3. Aortic atherosclerosis. Electronically Signed   By: Marin Roberts M.D.   On: 05/04/2022 16:12    PHYSICAL EXAM General:  Alert, well-developed, well-nourished elderly African-American lady  in no acute distress Respiratory:  Regular, unlabored respirations on room air  NEURO:  Mental Status: AA&Ox3  Speech/Language: Mild dysarthria but no aphasia fluency, and comprehension intact.  Cranial Nerves:  II: PERRL. Visual fields full, does not blink to threat on the left III, IV, VI: Right gaze preference but can cross midline V: Sensation is intact to light touch and symmetrical to face.  VII: Smile is asymmetrical.  With left lower facial weakness VIII: hearing intact to voice. IX, X: Phonation is normal.  XII: tongue is midline without fasciculations. Motor: 5/5 strength to RUE and RLE, 0/5 to LUE, 1/5 to LLE  Tone: is normal and bulk is normal Sensation- Intact to light touch bilaterally.  Gait- deferred    ASSESSMENT/PLAN Ms. HAYDIN DUNN is a 88 y.o. female with history of arthritis, CAD with stenting, DM, HTN, HLD, stroke and MI presenting with left sided weakness and left sided facial droop which have been present since she was diagnosed with a stroke several days ago at an outside hospital as well as new onset dysphagia.  Outside hospital records are not available.  She was found to have an acute PE and will need to be anticoagulated.  Stroke:  right MCA subacute stroke with  worsening of dysphagia Etiology:  possibly embolic from cryptogenic source New diagnosis of bilateral pulmonary emboli requiring  anticoagulation CT head New hypodensity at right cerebral peduncle, chronic right frontal lobe infarct CTA head & neck atheromatous plaque on bilateral carotid siphons, no LVO, moderate right PCA stenosis MRI  acute right MCA infarct involving insula and right frontal lobe, atrophy and chronic microvascular ischemic disease 2D Echo pending LDL 42 HgbA1c 6.0 VTE prophylaxis - fully anticoagulated with heparin    Diet   Diet NPO time specified   aspirin 81 mg daily and clopidogrel 75 mg daily prior to admission, now on heparin IV.  Therapy recommendations:  pending Disposition:  pending  Hypertension Home meds:  amlodipine 10 mg daily, enalapril 20 mg daily Stable Permissive hypertension (OK if < 220/120) but gradually normalize in 5-7 days Long-term BP goal normotensive  Hyperlipidemia Home meds:  rosuvastatin 40 mg daily, will resume after swallow evaluation LDL 42, goal < 70 Continue statin at discharge  Diabetes type II Controlled Home meds:  none HgbA1c 6.0, goal < 7.0 CBGs No results for input(s): "GLUCAP" in the last 72 hours.  SSI  Pulmonary embolus Large bilateral PE seen on CT chest Anticoagulation with IV heparin  Dysphagia SLP consult for swallow evaluation NPO for now  Other Stroke Risk Factors Advanced Age >/= 45  Hx stroke Coronary artery disease  Other Active Problems none  Hospital day # 0  Cortney E Ernestina Columbia , MSN, AGACNP-BC [facility] See Amion for schedule and pager information 05/05/2022 2:14 PM    STROKE MD NOTE :  I have personally obtained history,examined this patient, reviewed notes, independently viewed imaging studies, participated in medical decision making and plan of care.ROS completed by me personally and pertinent positives fully documented  I have made any additions or clarifications  directly to the above note. Agree with note above.  Patient with recent right frontal MCA branch infarct of cryptogenic etiology with left hemiparesis was discharged from [facility] but 1 day after returning home developed swallowing difficulties prompting readmission this time to [facility] and her work-up is also showing bilateral peripheral pulmonary emboli requiring anticoagulation with IV heparin now.  Recommend ongoing physical, occupational and speech therapies.  May need eventual transition to oral anticoagulation at the time of discharge likely to rehab.  No need to do prolonged cardiac monitoring for A-fib as patient has an indication for long-term anticoagulation with her pulmonary emboli.  Long discussion with patient and daughter at bedside and answered questions.  Discussed with Dr. Laural Benes.  Greater than 50% time during this 50-minute visit was spent in coordination of care about her embolic stroke and pulmonary embolism and discussion with patient and family and care team and answering questions  Delia Heady, MD Medical Director [facility] Pager: [phone] 05/05/2022 3:01 PM   To contact Stroke Continuity provider, please refer to [email or web address]. After hours, contact General Neurology

## 2022-05-05 NOTE — Progress Notes (Signed)
  Echocardiogram 2D Echocardiogram has been performed.  [name] [name] 05/05/2022, 11:55 AM

## 2022-05-05 NOTE — Assessment & Plan Note (Signed)
--  SLP evaluation requested, likely resultant from prior CVAs and acute insult

## 2022-05-05 NOTE — Evaluation (Signed)
Clinical/Bedside Swallow Evaluation Patient Details  Name: [name] [name] MRN: [id number] Date of Birth: [birth year]  Today's Date: 05/05/2022 Time: SLP Start Time (ACUTE ONLY): 1043 SLP Stop Time (ACUTE ONLY): 1058 SLP Time Calculation (min) (ACUTE ONLY): 15 min  Past Medical History:  Past Medical History:  Diagnosis Date   Ankle edema    Arthritis    right hip   Bronchitis    in Dec   CAD (coronary artery disease)    a. s/p DES to LAD in 2008, NST in 2015 and 2017 which were low-risk   Carotid stenosis    mild   Cataract    left;not ready to come off   Constipation    takes OTC stool softener as needed   Diabetes mellitus    pt doesn't take any meds for it   DJD (degenerative joint disease) of hip    right   Dry cough    GERD (gastroesophageal reflux disease)    Headache(784.0)    occasionally   HTN (hypertension)    takes Amlodpine and Carvedilol daily   Hx of cardiovascular stress test    Lexiscan Myoview (04/2014):  No ischemia, EF 64%, Low Risk   Hypercholesteremia    takes Crestor daily   Hypothyroidism    takes Synthroid daily   Ileus, postoperative (HCC) 05/12/2014   Myocardial infarction [facility])    pt unsure but thinks about 84yrs ago;is taking Plavix daily    Nocturia    Osteoarthritis of left hip 05/09/2014   Primary osteoarthritis of right hip 10/27/2011   Past Surgical History:  Past Surgical History:  Procedure Laterality Date   ABDOMINAL HYSTERECTOMY  20+yrs   BACK SURGERY  15+yrs ago   CARDIAC CATHETERIZATION  2008   CORONARY ARTERY BYPASS GRAFT     LYMPH NODE DISSECTION  20+yrs ago   right    TOTAL HIP ARTHROPLASTY  10/27/2011   Procedure: TOTAL HIP ARTHROPLASTY;  Surgeon: Eulas Post, MD;  Location: [facility] OR;  Service: Orthopedics;  Laterality: Right;   TOTAL HIP ARTHROPLASTY Left 05/09/2014   Procedure: LEFT TOTAL HIP ARTHROPLASTY;  Surgeon: Eulas Post, MD;  Location: [facility] OR;  Service: Orthopedics;  Laterality: Left;   HPI:  Pt is an 88 yo  female presenting with concerns of dysphagia. She was recently admitted to [facility] for hypokalemia but the morning after being discharged, she was found to be drooling and unable to take her pills or swallow her breakfast. MRI showed acute R MCA infarct involving the R insula nd overlying R frontal lobe. CT Chest also revealed large PE. PMH includes: stroke with L-sided hemiparesis, h/o feeding tube (removed earlier this year), CABG, HTN, CHF, hypothyroidism    Assessment / Plan / Recommendation  Clinical Impression  Pt reports a h/o dysphagia after previous CVA, after which she was NPO with PEG for some time but she did ultimately progress to unrestricted solids/liquids with PEG removal earlier this year. Today she presents with an increase in L sided facial weakness, also with reduced lingial ROM to the L side despite midline protrusion. She has L anterior loss of thin liquids, reduced with use of straw, but also with increased volume of loss when given pureed boluses. She does not have awareness of anterior loss but can manage it when cued to do so. Oral transit seemed delayed with purees initially, but appeared to be more swift even with limited trials administered. Overt coughing is only noted when challenged with larger, sequential straw  sips. In light of symptoms above indicative of acute dysphagia, would proceed with MBS as soon as can be scheduled. In the interim, would provide a few, small sips of plain water at a time, while also maintaining good oral care. Could also administer essential meds crushed in puree. SLP Visit Diagnosis: Dysphagia, unspecified (R13.10)    Aspiration Risk  Mild aspiration risk;Moderate aspiration risk;Risk for inadequate nutrition/hydration    Diet Recommendation NPO except meds;Free water protocol after oral care (few small sips at a time of plain water)   Liquid Administration via: Spoon;Straw Medication Administration: Crushed with puree    Other   Recommendations Oral Care Recommendations: Oral care QID    Recommendations for follow up therapy are one component of a multi-disciplinary discharge planning process, led by the attending physician.  Recommendations may be updated based on patient status, additional functional criteria and insurance authorization.  Follow up Recommendations  (tba)      Assistance Recommended at Discharge Frequent or constant Supervision/Assistance  Functional Status Assessment Patient has had a recent decline in their functional status and demonstrates the ability to make significant improvements in function in a reasonable and predictable amount of time.  Frequency and Duration            Prognosis Prognosis for Safe Diet Advancement: Good      Swallow Study   General HPI: Pt is an 88 yo female presenting with concerns of dysphagia. She was recently admitted to [facility] for hypokalemia but the morning after being discharged, she was found to be drooling and unable to take her pills or swallow her breakfast. MRI showed acute R MCA infarct involving the R insula nd overlying R frontal lobe. CT Chest also revealed large PE. PMH includes: stroke with L-sided hemiparesis, h/o feeding tube (removed earlier this year), CABG, HTN, CHF, hypothyroidism Type of Study: Bedside Swallow Evaluation Previous Swallow Assessment: none in chart but reportedly NPO with feeding tube at [facility] after previous CVA Diet Prior to this Study: NPO Temperature Spikes Noted: No Respiratory Status: Room air History of Recent Intubation: No Behavior/Cognition: Alert;Cooperative;Pleasant mood;Requires cueing Oral Cavity Assessment: Within Functional Limits Oral Care Completed by SLP: No Oral Cavity - Dentition: Adequate natural dentition;Poor condition Vision: Functional for self-feeding Self-Feeding Abilities: Needs assist Patient Positioning: Upright in bed Baseline Vocal Quality: Normal Volitional Cough:  Weak Volitional Swallow: Unable to elicit    Oral/Motor/Sensory Function Overall Oral Motor/Sensory Function: Moderate impairment Facial ROM: Reduced left;Suspected CN VII (facial) dysfunction Facial Symmetry: Abnormal symmetry left;Suspected CN VII (facial) dysfunction Facial Strength: Reduced left;Suspected CN VII (facial) dysfunction Facial Sensation: Reduced left;Suspected CN V (Trigeminal) dysfunction Lingual ROM: Reduced left;Suspected CN XII (hypoglossal) dysfunction Lingual Symmetry:  (midline protrusion but then does not bring tongue toward L side)   Ice Chips Ice chips: Within functional limits Presentation: Spoon   Thin Liquid Thin Liquid: Impaired Presentation: Cup;Self Fed;Spoon;Straw Oral Phase Impairments: Reduced labial seal Oral Phase Functional Implications: Left anterior spillage;Prolonged oral transit Pharyngeal  Phase Impairments: Cough - Immediate    Nectar Thick Nectar Thick Liquid: Not tested   Honey Thick Honey Thick Liquid: Not tested   Puree Puree: Impaired Presentation: Self Fed;Spoon Oral Phase Impairments: Reduced labial seal;Poor awareness of bolus Oral Phase Functional Implications: Left anterior spillage;Left lateral sulci pocketing;Prolonged oral transit   Solid     Solid: Not tested      Mahala Menghini., M.A. CCC-SLP Acute Rehabilitation Services Office [phone]  Secure chat preferred  05/05/2022,11:27 AM

## 2022-05-05 NOTE — Progress Notes (Signed)
EEG complete - results pending 

## 2022-05-05 NOTE — Procedures (Signed)
Patient Name: [name] [name]  MRN: [id number]  Epilepsy Attending: Charlsie Quest  Referring Physician/Provider: Caryl Pina, MD  Date: 05/05/2022 Duration: 22.07 mins  Patient history: 88 year old female with right frontal infarct.  EEG to evaluate for stroke.  Level of alertness: Awake, drowsy  AEDs during EEG study: None  Technical aspects: This EEG study was done with scalp electrodes positioned according to the 10-20 International system of electrode placement. Electrical activity was reviewed with band pass filter of 1-70Hz , sensitivity of 7 uV/mm, display speed of 74mm/sec with a 60Hz  notched filter applied as appropriate. EEG data were recorded continuously and digitally stored.  Video monitoring was available and reviewed as appropriate.  Description: The posterior dominant rhythm consists of 8-9 Hz activity of moderate voltage (25-35 uV) seen predominantly in posterior head regions, symmetric and reactive to eye opening and eye closing. Drowsiness was characterized by attenuation of the posterior background rhythm.  EEG showed intermittent 3 to 6 Hz theta-delta slowing in right temporal region.  Hyperventilation and photic stimulation were not performed.     ABNORMALITY - Intermittent slow, right temporal region  IMPRESSION: This study is suggestive of cortical dysfunction in right temporal region likely secondary to underlying stroke.  No seizures or epileptiform discharges were seen throughout the recording.  [name] [name] 

## 2022-05-05 NOTE — Evaluation (Signed)
Speech Language Pathology Evaluation Patient Details Name: GRAELYN BIHL MRN: [id number] DOB: 03/09/1934 Today's Date: 05/05/2022 Time: 6834-1962 SLP Time Calculation (min) (ACUTE ONLY): 15 min  Problem List:  Patient Active Problem List   Diagnosis Date Noted   Acute right MCA stroke (HCC) 05/04/2022   Acute pulmonary embolism (HCC) 05/04/2022   Anemia 05/04/2022   Acute ischemic stroke (HCC) 06/17/2021   Transient ischemic attack 06/16/2021   Slurred speech 06/16/2021   Headache 06/16/2021   Hyperglycemia 06/16/2021   Hypothyroidism 06/16/2021   Ischemic cardiomyopathy 08/24/2018   Ectopic atrial rhythm 08/24/2018   Ileus, postoperative (HCC) 05/12/2014   Osteoarthritis of left hip 05/09/2014   Hip arthritis 05/09/2014   Primary osteoarthritis of right hip 10/27/2011   Hip pain 08/24/2011   Chronic diastolic heart failure (HCC) 12/24/2009   SHORTNESS OF BREATH 11/29/2009   Hyperlipidemia 09/17/2008   Essential hypertension 09/17/2008   Coronary artery disease 09/17/2008   CAROTID ARTERY STENOSIS 09/17/2008   Past Medical History:  Past Medical History:  Diagnosis Date   Ankle edema    Arthritis    right hip   Bronchitis    in Dec   CAD (coronary artery disease)    a. s/p DES to LAD in 2008, NST in 2015 and 2017 which were low-risk   Carotid stenosis    mild   Cataract    left;not ready to come off   Constipation    takes OTC stool softener as needed   Diabetes mellitus    pt doesn't take any meds for it   DJD (degenerative joint disease) of hip    right   Dry cough    GERD (gastroesophageal reflux disease)    Headache(784.0)    occasionally   HTN (hypertension)    takes Amlodpine and Carvedilol daily   Hx of cardiovascular stress test    Lexiscan Myoview (04/2014):  No ischemia, EF 64%, Low Risk   Hypercholesteremia    takes Crestor daily   Hypothyroidism    takes Synthroid daily   Ileus, postoperative (HCC) 05/12/2014   Myocardial infarction [facility])     pt unsure but thinks about 23yrs ago;is taking Plavix daily    Nocturia    Osteoarthritis of left hip 05/09/2014   Primary osteoarthritis of right hip 10/27/2011   Past Surgical History:  Past Surgical History:  Procedure Laterality Date   ABDOMINAL HYSTERECTOMY  20+yrs   BACK SURGERY  15+yrs ago   CARDIAC CATHETERIZATION  2008   CORONARY ARTERY BYPASS GRAFT     LYMPH NODE DISSECTION  20+yrs ago   right    TOTAL HIP ARTHROPLASTY  10/27/2011   Procedure: TOTAL HIP ARTHROPLASTY;  Surgeon: Eulas Post, MD;  Location: [facility] OR;  Service: Orthopedics;  Laterality: Right;   TOTAL HIP ARTHROPLASTY Left 05/09/2014   Procedure: LEFT TOTAL HIP ARTHROPLASTY;  Surgeon: Eulas Post, MD;  Location: [facility] OR;  Service: Orthopedics;  Laterality: Left;   HPI:  Pt is an 88 yo female presenting with concerns of dysphagia. She was recently admitted to [facility] for hypokalemia but the morning after being discharged, she was found to be drooling and unable to take her pills or swallow her breakfast. MRI showed acute R MCA infarct involving the R insula nd overlying R frontal lobe. CT Chest also revealed large PE. PMH includes: stroke with L-sided hemiparesis, h/o feeding tube (removed earlier this year), CABG, HTN, CHF, hypothyroidism   Assessment / Plan / Recommendation Clinical Impression  Pt  presents with acute on chronic changes to speech and cognition. At baseline she does receive assistance from her children already, but her daughter notes increased confusion this admission, such as when asked her DOB and pt had to look to her daughter for how to respond. Her speech is not back to baseline, although reportedly improved from previous date. It is characterized by imprecise articulation and slow rate, likely related to sensory/motor deficits noted during cranial nerve exam. Pt has reduced awareness particularly as it pertains to acute impairments. She will benefit from SLP f/u to maximize safety.    SLP  Assessment  SLP Recommendation/Assessment: Patient needs continued Speech Lanaguage Pathology Services SLP Visit Diagnosis: Dysarthria and anarthria (R47.1);Cognitive communication deficit (R41.841)    Recommendations for follow up therapy are one component of a multi-disciplinary discharge planning process, led by the attending physician.  Recommendations may be updated based on patient status, additional functional criteria and insurance authorization.    Follow Up Recommendations  Acute inpatient rehab (3hours/day)    Assistance Recommended at Discharge  Frequent or constant Supervision/Assistance  Functional Status Assessment Patient has had a recent decline in their functional status and demonstrates the ability to make significant improvements in function in a reasonable and predictable amount of time.  Frequency and Duration min 2x/week  2 weeks      SLP Evaluation Cognition  Overall Cognitive Status: Impaired/Different from baseline Arousal/Alertness: Awake/alert Orientation Level: Oriented to person;Oriented to situation Attention: Sustained Sustained Attention: Appears intact (with simple, functional tasks) Awareness: Impaired Awareness Impairment: Emergent impairment Problem Solving: Impaired Problem Solving Impairment: Functional basic       Comprehension  Auditory Comprehension Overall Auditory Comprehension: Impaired Commands: Impaired One Step Basic Commands: 75-100% accurate    Expression Expression Primary Mode of Expression: Verbal Verbal Expression Overall Verbal Expression: Appears within functional limits for tasks assessed   Oral / Motor  Oral Motor/Sensory Function Overall Oral Motor/Sensory Function: Moderate impairment Facial ROM: Reduced left;Suspected CN VII (facial) dysfunction Facial Symmetry: Abnormal symmetry left;Suspected CN VII (facial) dysfunction Facial Strength: Reduced left;Suspected CN VII (facial) dysfunction Facial Sensation:  Reduced left;Suspected CN V (Trigeminal) dysfunction Lingual ROM: Reduced left;Suspected CN XII (hypoglossal) dysfunction Lingual Symmetry: Abnormal symmetry left (midline protrusion but then does not bring tongue toward L side) Motor Speech Overall Motor Speech: Impaired Respiration: Within functional limits Phonation: Normal Resonance: Within functional limits Articulation: Impaired Level of Impairment: Phrase Intelligibility: Intelligibility reduced Word: 75-100% accurate Phrase: 75-100% accurate Sentence: 75-100% accurate Interfering Components: Premorbid status            Mahala Menghini., M.A. CCC-SLP Acute Rehabilitation Services Office [phone]  Secure chat preferred  05/05/2022, 11:35 AM

## 2022-05-05 NOTE — Progress Notes (Signed)
PT Cancellation Note  Patient Details Name: [name] [name] MRN: [id number] DOB: 1934-08-27   Cancelled Treatment:    Reason Eval/Treat Not Completed: Patient not medically ready. Pt admitted with acute CVA and acute PE. Per MD, hold therapies until 05/06/22.    [name] 05/05/2022, 8:58 AM  Conni Slipper, PT, DPT Acute Rehabilitation Services Secure Chat Preferred Office: [phone]

## 2022-05-05 NOTE — Progress Notes (Signed)
OT Cancellation Note  Patient Details Name: [name] [name] MRN: [id number] DOB: 1934/10/05   Cancelled Treatment:    Reason Eval/Treat Not Completed: Medical issues which prohibited therapy (pt with acute PE not yet on anticoagulation for 24 hrs, MD requesting hold therpy until tomorrow (05/06/22).[name], OTD, OTR/L Acute Rehab [phone]  [name] 05/05/2022, 9:10 AM

## 2022-05-06 DIAGNOSIS — R1310 Dysphagia, unspecified: Secondary | ICD-10-CM | POA: Diagnosis not present

## 2022-05-06 DIAGNOSIS — D649 Anemia, unspecified: Secondary | ICD-10-CM

## 2022-05-06 DIAGNOSIS — I63511 Cerebral infarction due to unspecified occlusion or stenosis of right middle cerebral artery: Secondary | ICD-10-CM | POA: Diagnosis not present

## 2022-05-06 DIAGNOSIS — Z515 Encounter for palliative care: Secondary | ICD-10-CM

## 2022-05-06 DIAGNOSIS — I5032 Chronic diastolic (congestive) heart failure: Secondary | ICD-10-CM | POA: Diagnosis not present

## 2022-05-06 DIAGNOSIS — I2699 Other pulmonary embolism without acute cor pulmonale: Secondary | ICD-10-CM | POA: Diagnosis not present

## 2022-05-06 LAB — CBC
HCT: 27.9 % — ABNORMAL LOW (ref 36.0–46.0)
Hemoglobin: 9.1 g/dL — ABNORMAL LOW (ref 12.0–15.0)
MCH: 29.4 pg (ref 26.0–34.0)
MCHC: 32.6 g/dL (ref 30.0–36.0)
MCV: 90 fL (ref 80.0–100.0)
Platelets: 245 10*3/uL (ref 150–400)
RBC: 3.1 MIL/uL — ABNORMAL LOW (ref 3.87–5.11)
RDW: 14.6 % (ref 11.5–15.5)
WBC: 11.4 10*3/uL — ABNORMAL HIGH (ref 4.0–10.5)
nRBC: 0 % (ref 0.0–0.2)

## 2022-05-06 LAB — BASIC METABOLIC PANEL
Anion gap: 13 (ref 5–15)
BUN: 5 mg/dL — ABNORMAL LOW (ref 8–23)
CO2: 24 mmol/L (ref 22–32)
Calcium: 8.4 mg/dL — ABNORMAL LOW (ref 8.9–10.3)
Chloride: 106 mmol/L (ref 98–111)
Creatinine, Ser: 0.72 mg/dL (ref 0.44–1.00)
GFR, Estimated: >60 mL/min (ref >60)
Glucose, Bld: 80 mg/dL (ref 70–99)
Potassium: 2.7 mmol/L — CL (ref 3.5–5.1)
Sodium: 143 mmol/L (ref 135–145)

## 2022-05-06 LAB — HEPARIN LEVEL (UNFRACTIONATED)
Heparin Unfractionated: 0.51 IU/mL (ref 0.30–0.70)
Heparin Unfractionated: 0.45 IU/mL (ref 0.30–0.70)

## 2022-05-06 LAB — MAGNESIUM: Magnesium: 1.6 mg/dL — ABNORMAL LOW (ref 1.7–2.4)

## 2022-05-06 MED ORDER — POTASSIUM CHLORIDE CRYS ER 20 MEQ PO TBCR
40.0000 meq | EXTENDED_RELEASE_TABLET | Freq: Once | ORAL | Status: AC
  Administered 2022-05-06: 40 meq via ORAL
  Filled 2022-05-06: qty 2

## 2022-05-06 MED ORDER — LEVOTHYROXINE SODIUM 75 MCG PO TABS
75.0000 ug | ORAL_TABLET | Freq: Every morning | ORAL | Status: DC
  Administered 2022-05-06 – 2022-05-12 (×7): 75 ug via ORAL
  Filled 2022-05-06 (×7): qty 1

## 2022-05-06 MED ORDER — MAGNESIUM SULFATE 2 GM/50ML IV SOLN
2.0000 g | Freq: Once | INTRAVENOUS | Status: AC
  Administered 2022-05-06: 2 g via INTRAVENOUS
  Filled 2022-05-06: qty 50

## 2022-05-06 MED ORDER — POTASSIUM CHLORIDE 10 MEQ/100ML IV SOLN
10.0000 meq | INTRAVENOUS | Status: AC
  Administered 2022-05-06 (×3): 10 meq via INTRAVENOUS
  Filled 2022-05-06 (×2): qty 100

## 2022-05-06 NOTE — Progress Notes (Addendum)
Progress Note  Patient: [name] [name] KXF:[id number] DOB: 27-Sep-1934  DOA: 05/04/2022  DOS: 05/06/2022    Brief hospital course: 88 y.o. female with medical history significant of stroke on aspirin and Plavix left-sided hemiparesis, CABG, hypertension, chronic diastolic heart failure, and hypothyroidism who presents with concerns of dysphagia.   Patient was recently hospitalized at [facility] overnight about 2 days ago.  Unfortunately unable to see records.  She presented there with a headache and reportedly was admitted for hypokalemia.  Family believes a CT head scan was done and her headache also resolved at discharge.  The night following discharge she was doing well and able to swallow her pills.  Then this morning when she woke up daughter noticed she was drooling all over when she attempted to take her pills.  Then again when she tried eating breakfast prompting daughter to take her to the ED.  Patient denies any headache or blurry vision.  No chest pain, shortness of breath.  No nausea, vomiting. Daughter also notes that about 3 weeks ago she was prescribed potassium supplementation and had persistent dark-colored diarrhea.  This resolved intermittently in terms of both diarrhea and her stool returning to brown color, but again about 6 days ago there was another episode of dark stool.  She is on aspirin and Plavix for her CVA   In the ED, she was afebrile and normotensive on room air. Had mild leukocytosis of 11.3, hemoglobin 10.9 which appears to be downward trending 12.8 back in 06/2021.  No other significant electrolyte abnormalities.   EKG my review sinus rhythm and slight ST depression in V4 to V6 which is also seen on prior EKGs.     CT head with new small ill-defined hypodensity at the right cerebral peduncle. MRI brain revealing for acute right MCA distribution infarct involving the right insula and overlying the right frontal lobe.   CT chest with large bilateral central  pulmonary artery emboli extending into the lobar branch with no evidence of right heart strain and evidence of right heart strain.  Trace left pleural effusion with developing infarct not excluded.   ED PA discussed with neurology Dr. Otelia Limes and after extensive discussion with him and with patient and her family.  They have decided to initiate IV heparin for a PE in the setting of new stroke.  Hospitalist then consult for admission.  Assessment and Plan: Acute right MCA stroke: acute right MCA infarct involving insula and right frontal lobe, atrophy and chronic microvascular ischemic disease - On IV heparin (PTA was DAPT) due to PE.  - Continue telemetry, though pt has indication for anticoagulation regardless.  - LDL is 42, continue rosuvastatin 40mg  daily.  - Add back HTN medications slowly.  - PT/OT evaluations recommending SNF. TOC consulted.    Possible acute blood loss anemia:  - Check anemia panel - Monitor H/H serially as pt is having downward trend of hgb and recent darker stools (attributed to potassium supplement per family). Note BUN is 5 arguing against UGIB at this time.  - In setting of PE, will continue IV heparin for now. If FOBT is positive, would need work up.    Acute pulmonary embolism: -CT chest with contrast (not CTA) with large bilateral central pulmonary artery emboli extending into the lobar branch with no evidence of right heart strain and evidence of right heart strain. Trace left pleural effusion with developing infarct not excluded. -Suspect due to patient being bedbound due to her previous stroke resulting in left-sided  hemiparesis. It is peculiar that the patient has no dyspnea or hypoxemia, though exertional capacity is really not tested. Echo noted to have normal RV. Pt isn't even tachycardic. Will check venous U/S of extremities. If no blood clot, may consider repeat imaging with CTA for better characterization. -Continue IV heparin, therapeutic, dosed per  pharmacy.    Dysphagia:  - Dysphagia 1 and thin liquids, aspiration precautions per SLP. Hoping to avoid need for PEG. Palliative consultation appreciated, d/w Lorinda CreedMary Larach, NP.  Hypokalemia:  - Supplemented this AM. Will monitor again in AM.  Hypothyroidism - Continue synthroid   Slurred speech --SLP evaluation requested, likely resultant from prior CVAs and acute insult.    Chronic HFpEF: Stable   Essential hypertension - Allowing for permissive hypertension, holding norvasc, due to new right MCA stroke  Subjective: Feels fine, though has persistent neurological deficits. Seen during palliative care meeting with family. Has had PEG in the past and wouldn't be opposed to it now, though hoping not to need this.   Objective: Vitals:   05/05/22 2354 05/06/22 0448 05/06/22 0741 05/06/22 1523  BP: 137/73 116/67 (!) 168/94 117/60  Pulse: 72 (!) 58 89 64  Resp: 18 18 18 18   Temp: 99 F (37.2 C) 98.3 F (36.8 C) 98.3 F (36.8 C) 98.3 F (36.8 C)  TempSrc: Oral Oral    SpO2: 98% 97% 100% 97%  Weight:      Height:       Gen: Frail elderly, very pleasant female in no distress Pulm: Nonlabored breathing room air. Clear CV: Regular rate and rhythm. No murmur, rub, or gallop. No JVD, no significant dependent edema. GI: Abdomen soft, non-tender, non-distended, with normoactive bowel sounds.  Ext: Warm, dry, decreased left sided muscle bulk and tone. Skin: No acute rashes, lesions or ulcers on visualized skin. Neuro: Alert and oriented. +dysarthria, left facial droop, left hemiparesis without new focal neurological deficits. Psych: Judgement and insight appear fair. Mood euthymic & affect congruent. Behavior is appropriate.    Data Personally reviewed: CBC: Recent Labs  Lab 05/04/22 1359 05/05/22 0328 05/06/22 0241  WBC 11.3* 10.9* 11.4*  NEUTROABS 8.1*  --   --   HGB 10.9* 9.8* 9.1*  HCT 33.7* 30.3* 27.9*  MCV 90.3 88.9 90.0  PLT 250 231 245   Basic Metabolic  Panel: Recent Labs  Lab 05/04/22 1359 05/04/22 1421 05/06/22 0241  NA 141  --  143  K 3.9  --  2.7*  CL 106  --  106  CO2 28  --  24  GLUCOSE 113*  --  80  BUN 8  --  5*  CREATININE 0.81  --  0.72  CALCIUM 8.9  --  8.4*  MG  --  1.9 1.6*   GFR: Estimated Creatinine Clearance: 44.1 mL/min (by C-G formula based on SCr of 0.72 mg/dL). Liver Function Tests: Recent Labs  Lab 05/04/22 1359  AST 29  ALT 13  ALKPHOS 107  BILITOT 0.6  PROT 7.7  ALBUMIN 2.4*   No results for input(s): "LIPASE", "AMYLASE" in the last 168 hours. No results for input(s): "AMMONIA" in the last 168 hours. Coagulation Profile: Recent Labs  Lab 05/04/22 1359  INR 1.2   Cardiac Enzymes: No results for input(s): "CKTOTAL", "CKMB", "CKMBINDEX", "TROPONINI" in the last 168 hours. BNP (last 3 results) No results for input(s): "PROBNP" in the last 8760 hours. HbA1C: Recent Labs    05/04/22 1359  HGBA1C 6.0*   CBG: Recent Labs  Lab 05/05/22  1612  GLUCAP 76   Lipid Profile: Recent Labs    05/05/22 0328  CHOL 87  HDL 33*  LDLCALC 42  TRIG 59  CHOLHDL 2.6   Thyroid Function Tests: No results for input(s): "TSH", "T4TOTAL", "FREET4", "T3FREE", "THYROIDAB" in the last 72 hours. Anemia Panel: No results for input(s): "VITAMINB12", "FOLATE", "FERRITIN", "TIBC", "IRON", "RETICCTPCT" in the last 72 hours. Urine analysis:    Component Value Date/Time   COLORURINE YELLOW 05/05/2022 0000   APPEARANCEUR CLEAR 05/05/2022 0000   LABSPEC 1.031 (H) 05/05/2022 0000   PHURINE 5.0 05/05/2022 0000   GLUCOSEU NEGATIVE 05/05/2022 0000   HGBUR SMALL (A) 05/05/2022 0000   BILIRUBINUR NEGATIVE 05/05/2022 0000   KETONESUR 5 (A) 05/05/2022 0000   PROTEINUR 30 (A) 05/05/2022 0000   UROBILINOGEN 1.0 10/22/2011 0926   NITRITE NEGATIVE 05/05/2022 0000   LEUKOCYTESUR NEGATIVE 05/05/2022 0000   No results found for this or any previous visit (from the past 240 hour(s)).   ECHOCARDIOGRAM COMPLETE  Result  Date: 05/05/2022    ECHOCARDIOGRAM REPORT   Patient Name:   RHIANNON SASSAMAN [facility] Date of Exam: 05/05/2022 Medical Rec #:  [id number]      Height:       61.0 in Accession #:    [id number]     Weight:       147.0 lb Date of Birth:  January 02, 1934       BSA:          1.657 m Patient Age:    88 years       BP:           132/61 mmHg Patient Gender: F              HR:           64 bpm. Exam Location:  Inpatient Procedure: 2D Echo, Cardiac Doppler, Color Doppler and Saline Contrast Bubble            Study Indications:    Stroke  History:        Patient has prior history of Echocardiogram examinations, most                 recent 06/17/2021. CHF, Prior CABG; Risk Factors:Hypertension.  Sonographer:    Ross Ludwig RDCS (AE) Referring Phys: [id number] CHING T TU IMPRESSIONS  1. Left ventricular ejection fraction, by estimation, is 60 to 65%. The left ventricle has normal function. Left ventricular endocardial border not optimally defined to evaluate regional wall motion. Left ventricular diastolic parameters are consistent with Grade I diastolic dysfunction (impaired relaxation).  2. Right ventricular systolic function is normal. The right ventricular size is normal. There is normal pulmonary artery systolic pressure.  3. The mitral valve is grossly normal. Mild mitral valve regurgitation.  4. The aortic valve was not well visualized. Aortic valve regurgitation is not visualized.  5. The inferior vena cava is normal in size with greater than 50% respiratory variability, suggesting right atrial pressure of 3 mmHg. Comparison(s): Compared to prior TTE 06/2021, there is no significant change. Conclusion(s)/Recommendation(s): No intracardiac source of embolism detected on this transthoracic study. Consider a transesophageal echocardiogram to exclude cardiac source of embolism if clinically indicated. FINDINGS  Left Ventricle: Left ventricular ejection fraction, by estimation, is 60 to 65%. The left ventricle has normal function. Left ventricular  endocardial border not optimally defined to evaluate regional wall motion. The left ventricular internal cavity size was normal in size. There is no left ventricular hypertrophy. Left ventricular diastolic parameters are  consistent with Grade I diastolic dysfunction (impaired relaxation). Right Ventricle: The right ventricular size is normal. No increase in right ventricular wall thickness. Right ventricular systolic function is normal. There is normal pulmonary artery systolic pressure. The tricuspid regurgitant velocity is 2.59 m/s, and  with an assumed right atrial pressure of 8 mmHg, the estimated right ventricular systolic pressure is 34.8 mmHg. Left Atrium: Left atrial size was normal in size. Right Atrium: Right atrial size was normal in size. Pericardium: There is no evidence of pericardial effusion. Mitral Valve: The mitral valve is grossly normal. There is mild thickening of the mitral valve leaflet(s). There is mild calcification of the mitral valve leaflet(s). Mild mitral valve regurgitation. Tricuspid Valve: The tricuspid valve is normal in structure. Tricuspid valve regurgitation is trivial. Aortic Valve: The aortic valve was not well visualized. Aortic valve regurgitation is not visualized. Aortic valve mean gradient measures 3.0 mmHg. Aortic valve peak gradient measures 6.7 mmHg. Aortic valve area, by VTI measures 2.67 cm. Pulmonic Valve: The pulmonic valve was not well visualized. Aorta: The aortic root is normal in size and structure. Venous: The inferior vena cava is normal in size with greater than 50% respiratory variability, suggesting right atrial pressure of 3 mmHg. IAS/Shunts: The atrial septum is grossly normal. Agitated saline contrast was given intravenously to evaluate for intracardiac shunting.  LEFT VENTRICLE PLAX 2D LVIDd:         4.00 cm   Diastology LVIDs:         2.60 cm   LV e' medial:    5.11 cm/s LV PW:         0.70 cm   LV E/e' medial:  14.1 LV IVS:        0.60 cm   LV e'  lateral:   6.53 cm/s LVOT diam:     1.90 cm   LV E/e' lateral: 11.1 LV SV:         69 LV SV Index:   42 LVOT Area:     2.84 cm  IVC IVC diam: 0.90 cm LEFT ATRIUM           Index        RIGHT ATRIUM           Index LA diam:      2.70 cm 1.63 cm/m   RA Area:     12.10 cm LA Vol (A4C): 23.9 ml 14.42 ml/m  RA Volume:   28.60 ml  17.26 ml/m  AORTIC VALVE AV Area (Vmax):    2.66 cm AV Area (Vmean):   2.77 cm AV Area (VTI):     2.67 cm AV Vmax:           129.00 cm/s AV Vmean:          74.900 cm/s AV VTI:            0.258 m AV Peak Grad:      6.7 mmHg AV Mean Grad:      3.0 mmHg LVOT Vmax:         121.00 cm/s LVOT Vmean:        73.100 cm/s LVOT VTI:          0.243 m LVOT/AV VTI ratio: 0.94  AORTA Ao Root diam: 2.60 cm MITRAL VALVE                TRICUSPID VALVE MV Area (PHT): 2.42 cm     TR Peak grad:   26.8 mmHg MV Decel Time: 313  msec     TR Vmax:        259.00 cm/s MV E velocity: 72.20 cm/s MV A velocity: 126.00 cm/s  SHUNTS MV E/A ratio:  0.57         Systemic VTI:  0.24 m                             Systemic Diam: 1.90 cm Laurance Flatten MD Electronically signed by Laurance Flatten MD Signature Date/Time: 05/05/2022/3:38:39 PM    Final    DG Swallowing Func-Speech Pathology  Result Date: 05/05/2022 Table formatting from the original result was not included. Objective Swallowing Evaluation: Type of Study: MBS-Modified Barium Swallow Study  Patient Details Name: NATAVIA SUBLETTE MRN: [id number] Date of Birth: Mar 13, 1934 Today's Date: 05/05/2022 Time: SLP Start Time (ACUTE ONLY): 1330 -SLP Stop Time (ACUTE ONLY): 1346 SLP Time Calculation (min) (ACUTE ONLY): 16 min Past Medical History: Past Medical History: Diagnosis Date  Ankle edema   Arthritis   right hip  Bronchitis   in Dec  CAD (coronary artery disease)   a. s/p DES to LAD in 2008, NST in 2015 and 2017 which were low-risk  Carotid stenosis   mild  Cataract   left;not ready to come off  Constipation   takes OTC stool softener as needed  Diabetes mellitus    pt doesn't take any meds for it  DJD (degenerative joint disease) of hip   right  Dry cough   GERD (gastroesophageal reflux disease)   Headache(784.0)   occasionally  HTN (hypertension)   takes Amlodpine and Carvedilol daily  Hx of cardiovascular stress test   Lexiscan Myoview (04/2014):  No ischemia, EF 64%, Low Risk  Hypercholesteremia   takes Crestor daily  Hypothyroidism   takes Synthroid daily  Ileus, postoperative (HCC) 05/12/2014  Myocardial infarction [facility])   pt unsure but thinks about 54yrs ago;is taking Plavix daily   Nocturia   Osteoarthritis of left hip 05/09/2014  Primary osteoarthritis of right hip 10/27/2011 Past Surgical History: Past Surgical History: Procedure Laterality Date  ABDOMINAL HYSTERECTOMY  20+yrs  BACK SURGERY  15+yrs ago  CARDIAC CATHETERIZATION  2008  CORONARY ARTERY BYPASS GRAFT    LYMPH NODE DISSECTION  20+yrs ago  right   TOTAL HIP ARTHROPLASTY  10/27/2011  Procedure: TOTAL HIP ARTHROPLASTY;  Surgeon: Eulas Post, MD;  Location: [facility] OR;  Service: Orthopedics;  Laterality: Right;  TOTAL HIP ARTHROPLASTY Left 05/09/2014  Procedure: LEFT TOTAL HIP ARTHROPLASTY;  Surgeon: Eulas Post, MD;  Location: [facility] OR;  Service: Orthopedics;  Laterality: Left; HPI: Pt is an 88 yo female presenting with concerns of dysphagia. She was recently admitted to [facility] for hypokalemia but the morning after being discharged, she was found to be drooling and unable to take her pills or swallow her breakfast. MRI showed acute R MCA infarct involving the R insula nd overlying R frontal lobe. CT Chest also revealed large PE. PMH includes: stroke with L-sided hemiparesis, h/o feeding tube (removed earlier this year), CABG, HTN, CHF, hypothyroidism  Subjective: pt says she couldn't swallow her oatmeal at home, thinks she's feeling a little better here  Recommendations for follow up therapy are one component of a multi-disciplinary discharge planning process, led by the attending physician.  Recommendations  may be updated based on patient status, additional functional criteria and insurance authorization. Assessment / Plan / Recommendation   05/05/2022   1:00 PM Clinical Impressions Clinical Impression Pt  presents with an oral more than pharyngeal dysphagia. She has incomplete L labial seal that results in significant bolus loss with thin liquids via cup, although with improved closure noted around a straw. Less anterior loss was noted with purees compared to clinical assessment earlier today, but oral residue noted with all consistencies is the most significant with purees. She requires cues to manage oral residue, saying that she does not feel it in her mouth. SLP provided visual feedback as well, allowing pt to watch MBS video in real time to try to facilitate oral clearance. A cued second swallow is generally effective but takes a little extra time to initiate. Pharyngeally she has reduced hyolaryngea movement and pharyngeal squeeze. Visibility throughout study somewhat limited by pt positioning with shadow from shoulder as well as residue from pyriform sinuses superimposed over laryngeal vestibule, although she does appear to have moments of trace penetration from thin liquids during the swallow. Pt also had mild residue at the pyriform sinuses with incomplete entrance into the UES as well as occasional backflow. Retrograde flow also noted during brief esophageal sweep (although could not scan the entire esophagus). Recommend starting Dys 1 diet and thin liquids with full supervision. Would use two swallows per bolus and take one small sip at a time. SLP Visit Diagnosis Dysphagia, unspecified (R13.10) Impact on safety and function Mild aspiration risk;Moderate aspiration risk;Risk for inadequate nutrition/hydration     05/05/2022   1:00 PM Treatment Recommendations Treatment Recommendations Therapy as outlined in treatment plan below     05/05/2022   1:00 PM Prognosis Prognosis for Safe Diet Advancement Good Barriers  to Reach Goals Cognitive deficits   05/05/2022   1:00 PM Diet Recommendations SLP Diet Recommendations Dysphagia 1 (Puree) solids;Thin liquid Liquid Administration via Straw Medication Administration Crushed with puree Compensations Minimize environmental distractions;Slow rate;Small sips/bites;Multiple dry swallows after each bite/sip Postural Changes Seated upright at 90 degrees;Remain semi-upright after after feeds/meals (Comment)     05/05/2022   1:00 PM Other Recommendations Oral Care Recommendations Oral care BID Follow Up Recommendations Acute inpatient rehab (3hours/day) Assistance recommended at discharge Frequent or constant Supervision/Assistance Functional Status Assessment Patient has had a recent decline in their functional status and demonstrates the ability to make significant improvements in function in a reasonable and predictable amount of time.   05/05/2022   1:00 PM Frequency and Duration  Speech Therapy Frequency (ACUTE ONLY) min 2x/week Treatment Duration 2 weeks     05/05/2022   1:00 PM Oral Phase Oral Phase Impaired Oral - Nectar Straw Left anterior bolus loss;Reduced posterior propulsion;Lingual/palatal residue Oral - Thin Cup Left anterior bolus loss;Reduced posterior propulsion;Lingual/palatal residue Oral - Thin Straw Left anterior bolus loss;Reduced posterior propulsion;Lingual/palatal residue Oral - Puree Left anterior bolus loss;Reduced posterior propulsion;Lingual/palatal residue    05/05/2022   1:00 PM Pharyngeal Phase Pharyngeal Phase Impaired Pharyngeal- Thin Cup Reduced anterior laryngeal mobility;Reduced laryngeal elevation;Reduced airway/laryngeal closure;Pharyngeal residue - pyriform Pharyngeal- Thin Straw Reduced anterior laryngeal mobility;Reduced laryngeal elevation;Reduced airway/laryngeal closure;Pharyngeal residue - pyriform;Penetration/Aspiration during swallow Pharyngeal Material enters airway, remains ABOVE vocal cords and not ejected out Pharyngeal- Puree Reduced anterior  laryngeal mobility;Reduced laryngeal elevation;Reduced airway/laryngeal closure;Pharyngeal residue - pyriform    05/05/2022   1:00 PM Cervical Esophageal Phase  Cervical Esophageal Phase Impaired Nectar Straw Reduced cricopharyngeal relaxation Thin Cup Reduced cricopharyngeal relaxation Thin Straw Reduced cricopharyngeal relaxation Puree Reduced cricopharyngeal relaxation Mahala Menghini., M.A. CCC-SLP Acute Rehabilitation Services Office [phone] Secure chat preferred 05/05/2022, 3:06 PM  EEG adult  Result Date: 05/05/2022 Charlsie Quest, MD     05/05/2022 12:45 PM Patient Name: TANISH SINKLER MRN: [id number] Epilepsy Attending: Charlsie Quest Referring Physician/Provider: Caryl Pina, MD Date: 05/05/2022 Duration: 22.07 mins Patient history: 88 year old female with right frontal infarct.  EEG to evaluate for stroke. Level of alertness: Awake, drowsy AEDs during EEG study: None Technical aspects: This EEG study was done with scalp electrodes positioned according to the 10-20 International system of electrode placement. Electrical activity was reviewed with band pass filter of 1-70Hz , sensitivity of 7 uV/mm, display speed of 9mm/sec with a 60Hz  notched filter applied as appropriate. EEG data were recorded continuously and digitally stored.  Video monitoring was available and reviewed as appropriate. Description: The posterior dominant rhythm consists of 8-9 Hz activity of moderate voltage (25-35 uV) seen predominantly in posterior head regions, symmetric and reactive to eye opening and eye closing. Drowsiness was characterized by attenuation of the posterior background rhythm.  EEG showed intermittent 3 to 6 Hz theta-delta slowing in right temporal region.  Hyperventilation and photic stimulation were not performed.   ABNORMALITY - Intermittent slow, right temporal region IMPRESSION: This study is suggestive of cortical dysfunction in right temporal region likely secondary to underlying  stroke.  No seizures or epileptiform discharges were seen throughout the recording.   CT ANGIO HEAD NECK W WO CM  Result Date: 05/05/2022 CLINICAL DATA:  Stroke follow-up EXAM: CT ANGIOGRAPHY HEAD AND NECK TECHNIQUE: Multidetector CT imaging of the head and neck was performed using the standard protocol during bolus administration of intravenous contrast. Multiplanar CT image reconstructions and MIPs were obtained to evaluate the vascular anatomy. Carotid stenosis measurements (when applicable) are obtained utilizing NASCET criteria, using the distal internal carotid diameter as the denominator. RADIATION DOSE REDUCTION: This exam was performed according to the departmental dose-optimization program which includes automated exposure control, adjustment of the mA and/or kV according to patient size and/or use of iterative reconstruction technique. CONTRAST:  51mL OMNIPAQUE IOHEXOL 350 MG/ML SOLN COMPARISON:  Head CT and brain MRI from yesterday FINDINGS: CT HEAD FINDINGS Brain: Known cytotoxic edema in the right frontal cortex from acute infarct. Small remote anterior right frontal and left frontal parietal infarcts. Chronic small vessel ischemia. Chronic lacunar infarcts in the left pons and right caudate. No acute hemorrhage, hydrocephalus, or collection. Vascular: See below Skull: Normal. Negative for fracture or focal lesion. Sinuses/Orbits: No acute finding. Review of the MIP images confirms the above findings CTA NECK FINDINGS Aortic arch: Partial coverage of the arch shows atherosclerosis without aneurysm. Right carotid system: Mild for age atheromatous plaque at the bifurcation without flow reducing stenosis or ulceration. Left carotid system: Moderate mainly calcified plaque at the bifurcation without flow reducing stenosis. Mixed density plaque at the common carotid. Negative for ulceration or beading Vertebral arteries: No proximal subclavian stenosis or irregularity. The right  vertebral artery is dominant. The vertebral arteries are widely patent to the level of the dura. Skeleton: Generalized cervical spine degeneration. No acute or aggressive finding. Other neck: 9 mm right thyroid nodule. No followup recommended (ref: J Am Coll Radiol. 2015 Feb;12(2): 143-50). Upper chest: No acute finding. Review of the MIP images confirms the above findings CTA HEAD FINDINGS Anterior circulation: Confluent atheromatous plaque on the carotid siphons. Atheromatous irregularity of medium size branches which is also generalized. No major branch occlusion or correctable flow limiting stenosis. The affected right MCA branch may be recanalized. Negative for aneurysm Posterior circulation: Dominant right vertebral  artery showing calcified plaque. The vertebral and basilar arteries are diffusely patent. Atheromatous irregularity of the posterior cerebral arteries including at least moderate right PCA stenosis before the bifurcation. High-grade narrowing of the left PCA at its bifurcation. Venous sinuses: Patent Anatomic variants: None significant Review of the MIP images confirms the above findings IMPRESSION: Cervical and intracranial atherosclerosis as described. No flow limiting stenosis or ulceration underlying the acute right MCA territory infarct. Electronically Signed   By: Tiburcio Pea M.D.   On: 05/05/2022 05:01   MR BRAIN WO CONTRAST  Result Date: 05/04/2022 CLINICAL DATA:  Initial evaluation for neuro deficit, stroke suspected. EXAM: MRI HEAD WITHOUT CONTRAST TECHNIQUE: Multiplanar, multiecho pulse sequences of the brain and surrounding structures were obtained without intravenous contrast. COMPARISON:  Prior CT from earlier the same day. FINDINGS: Brain: Generalized age-related cerebral atrophy. Patchy and confluent T2/FLAIR hyperintensity involving the periventricular and deep white matter both cerebral hemispheres, most consistent with chronic small vessel ischemic disease, moderate to  advanced in nature. Patchy involvement of the pons noted. Multiple scattered superimposed remote lacunar infarcts present about the hemispheric cerebral white matter, thalamocapsular regions, and pons. Remote right frontal cortical infarct noted. Few scattered foci of chronic hemosiderin staining noted about these infarcts. Evidence for wallerian degeneration at the right cerebral peduncle. Restricted diffusion involving the right insula and overlying right frontal lobe, consistent with acute right MCA distribution infarct (series 2, image 29). No associated hemorrhage or mass effect. No other evidence for acute or subacute ischemia. Gray-white matter differentiation otherwise maintained. No acute intracranial hemorrhage. No mass lesion, midline shift or mass effect. Ventricular prominence related global parenchymal volume loss without hydrocephalus. No extra-axial fluid collection. Empty sella noted. Vascular: Major intracranial vascular flow voids are maintained. Skull and upper cervical spine: Craniocervical junction within normal limits. Moderate spondylosis noted at C5-6. Bone marrow signal intensity normal. No scalp soft tissue abnormality. Sinuses/Orbits: Globes and orbital soft tissues within normal limits. Mild scattered mucosal thickening noted about the ethmoidal air cells and maxillary sinuses. Trace right mastoid effusion, of doubtful significance. Other: None. IMPRESSION: 1. Acute right MCA distribution infarct involving the right insula and overlying right frontal lobe. No associated hemorrhage or mass effect. 2. Underlying age-related cerebral atrophy with moderate to advanced chronic microvascular ischemic disease, with multiple remote infarcts as above. Electronically Signed   By: Rise Mu M.D.   On: 05/04/2022 21:48   CT Chest W Contrast  Result Date: 05/04/2022 CLINICAL DATA:  Respiratory illness. EXAM: CT CHEST WITH CONTRAST TECHNIQUE: Multidetector CT imaging of the chest was  performed during intravenous contrast administration. RADIATION DOSE REDUCTION: This exam was performed according to the departmental dose-optimization program which includes automated exposure control, adjustment of the mA and/or kV according to patient size and/or use of iterative reconstruction technique. CONTRAST:  75mL OMNIPAQUE IOHEXOL 300 MG/ML  SOLN COMPARISON:  Chest radiograph dated 05/04/2022. FINDINGS: Cardiovascular: There is no cardiomegaly or pericardial effusion. Advanced 3 vessel coronary vascular calcification. Mild atherosclerotic calcification of the thoracic aorta. No aneurysmal dilatation. The origins of the great vessels of the aortic arch appear patent. Evaluation of the pulmonary arteries is limited there is a timing of the contrast and suboptimal opacification. However, a large bilateral central pulmonary artery emboli extending into the lobar branches noted. No evidence of right heart straining. Mediastinum/Nodes: No hilar or mediastinal adenopathy. The esophagus is grossly unremarkable. There is a 13 mm right thyroid hypodense nodule. In the setting of significant comorbidities or limited life expectancy, no follow-up recommended (  ref: J Am Coll Radiol. 2015 Feb;12(2): 143-50).No mediastinal fluid collection. Lungs/Pleura: Trace left pleural effusion. There are bibasilar dependent atelectasis. A somewhat wedge shaped pleural based ground-glass area at the left lung base may represent atelectasis. Developing infarct is not excluded. There is no pneumothorax. The central airways are patent. Upper Abdomen: Indeterminate 2 x 2 cm enhancing area in the posterior right lobe of the liver, not characterized on this CT but may represent a hemangioma or vascular shunting. This can be better evaluated with MRI or ultrasound on a nonemergent/outpatient basis. Partially visualized gallbladder with possible small pericholecystic fluid or small stone versus calcification. Right upper quadrant ultrasound  may provide better evaluation. Small bilateral renal hypodense lesions are not characterized. Indeterminate 2.5 x 1.2 cm fluid attenuating structure in the left upper abdomen (123/3). Evaluation of the upper abdominal structures is very limited due to respiratory motion and partial visualization. Musculoskeletal: Osteopenia with degenerative changes of spine. No acute osseous pathology. IMPRESSION: 1. Large bilateral central pulmonary artery emboli extending into the lobar branches. No evidence of right heart straining. 2. Trace left pleural effusion with bibasilar dependent atelectasis. Developing infarct is not excluded. 3. Other nonemergent findings as above. 4. Aortic Atherosclerosis (ICD10-I70.0). These results were called by telephone at the time of interpretation on 05/04/2022 at 8:00 pm to provider [facility] , who verbally acknowledged these results. Electronically Signed   By: Elgie Collard M.D.   On: 05/04/2022 20:01    Family Communication: Multiple at bedside  Disposition: Status is: Inpatient Remains inpatient appropriate because: On IV heparin for PE monitoring for GI bleeding with downtrending hgb Planned Discharge Destination: Skilled nursing facility      Tyrone Nine, MD 05/06/2022 6:28 PM Page by [email or web address]

## 2022-05-06 NOTE — Progress Notes (Addendum)
Occupational Therapy Treatment Patient Details Name: [name] [name] MRN: [id number] DOB: March 28, 1934 Today's Date: 05/06/2022   History of present illness [name] [name] is a 88 y.o. female presenting with dysphagia. MRI postive for acute R MCA infarct, CT for PE. Recent hospital admission in [facility] 7/29. PMH: stroke with left-sided spastic hemiparesis, CABG, hypertension, chronic diastolic heart failure, and hypothyroidism   OT comments  Pt seen for follow up session to assess tolerance of L palm guard after 2 hours. Pt with no redness/irritation/pain noted or verbalized by pt. Washed pt's hand, Pt's son in room, discussed wear schedule and indications for discontinuing, son verbalized understanding. Also discussed with RN and posted signage in room. Pt presenting with impairments listed below, will follow acutely. Continue to recommend SNF at d/c.   Recommendations for follow up therapy are one component of a multi-disciplinary discharge planning process, led by the attending physician.  Recommendations may be updated based on patient status, additional functional criteria and insurance authorization.    Follow Up Recommendations  Skilled nursing-short term rehab (<3 hours/day)    Assistance Recommended at Discharge Frequent or constant Supervision/Assistance  Patient can return home with the following  A lot of help with walking and/or transfers;A lot of help with bathing/dressing/bathroom;Assistance with feeding;Direct supervision/assist for medications management;Direct supervision/assist for financial management;Assist for transportation;Help with stairs or ramp for entrance;Assistance with cooking/housework   Equipment Recommendations  Other (comment)    Recommendations for Other Services PT consult    Precautions / Restrictions Precautions Precautions: Fall Precaution Comments: L hemiparesis Restrictions Weight Bearing Restrictions: No       Mobility Bed Mobility Overal bed  mobility: Needs Assistance Bed Mobility: Rolling, Sit to Supine Rolling: Mod assist     Sit to supine: Max assist   General bed mobility comments: initiates rolling to R/L    Transfers Overall transfer level: Needs assistance   Transfers: Sit to/from Stand, Bed to chair/wheelchair/BSC Sit to Stand: Max assist, +2 safety/equipment Stand pivot transfers: Max assist, +2 safety/equipment               Balance Overall balance assessment: Needs assistance Sitting-balance support: Feet supported, Single extremity supported Sitting balance-Leahy Scale: Poor Sitting balance - Comments: pt able to maintain static sitting balance with R UE <15 sec prior to R lateral lean   Standing balance support: During functional activity Standing balance-Leahy Scale: Zero Standing balance comment: dependent on external support                           ADL either performed or assessed with clinical judgement   ADL Overall ADL's : Needs assistance/impaired Eating/Feeding: Maximal assistance   Grooming: Moderate assistance;Wash/dry hands Grooming Details (indicate cue type and reason): washes face sitting up in chair  General ADL Comments: pt seen to assess tolerance of L palm guard    Extremity/Trunk Assessment Upper Extremity Assessment Upper Extremity Assessment: LUE deficits/detail LUE Deficits / Details: increased tone throughout LUE, trace shoulder activation, no AROM noted at hand/wrist/elbow LUE Sensation: decreased proprioception;decreased light touch LUE Coordination: decreased fine motor;decreased gross motor   Lower Extremity Assessment Lower Extremity Assessment: Defer to PT evaluation   Cervical / Trunk Assessment Cervical / Trunk Assessment: Kyphotic    Vision   Additional Comments: will further assess   Perception     Praxis      Cognition Arousal/Alertness: Awake/alert Behavior During Therapy: Flat affect Overall Cognitive Status: History of  cognitive impairments - at  baseline                                 General Comments: per son pt with memory deficits from previous cva. pt stated she was in a hospital and the date in addition to her name. Pt followed all simple commands        Exercises      Shoulder Instructions       General Comments assessed L palm guard, no signs of skin breakdown, irriation, or pain noted/verbalized by pt. Implemented 2 hrs on/2 hrs off, and off at night schedule, discussed with NSG and family member in room, both verbalized understanding.    Pertinent Vitals/ Pain       Pain Assessment Pain Assessment: No/denies pain  Home Living Family/patient expects to be discharged to:: Private residence Living Arrangements: Children (daughter) Available Help at Discharge: Family;Available 24 hours/day Type of Home: House Home Access: Ramped entrance     Home Layout: Laundry or work area in basement;Two level               Home Equipment: Agricultural consultant (2 wheels);Wheelchair - manual      Lives With: Daughter    Prior Functioning/Environment              Frequency  Min 2X/week        Progress Toward Goals  OT Goals(current goals can now be found in the care plan section)  Progress towards OT goals: Progressing toward goals  Acute Rehab OT Goals Patient Stated Goal: none stated OT Goal Formulation: Patient unable to participate in goal setting Time For Goal Achievement: 05/20/22 Potential to Achieve Goals: Fair ADL Goals Pt Will Perform Eating: with min assist;sitting;with adaptive utensils Pt Will Perform Grooming: with supervision;bed level Additional ADL Goal #1: pt will perform bed mobility with mod A in prep for ADLs Additional ADL Goal #2: Pt will tolerate L palm guard wear schedule in order to decrease contracture risk  Plan Discharge plan remains appropriate;Frequency remains appropriate    Co-evaluation                 AM-PAC OT "6  Clicks" Daily Activity     Outcome Measure   Help from another person eating meals?: A Lot Help from another person taking care of personal grooming?: A Lot Help from another person toileting, which includes using toliet, bedpan, or urinal?: Total Help from another person bathing (including washing, rinsing, drying)?: A Lot Help from another person to put on and taking off regular upper body clothing?: A Lot Help from another person to put on and taking off regular lower body clothing?: Total 6 Click Score: 10    End of Session Equipment Utilized During Treatment: Gait belt  OT Visit Diagnosis: Other abnormalities of gait and mobility (R26.89);Unsteadiness on feet (R26.81);Muscle weakness (generalized) (M62.81);Other symptoms and signs involving cognitive function;Other symptoms and signs involving the nervous system (R29.898)   Activity Tolerance Patient tolerated treatment well   Patient Left in bed;with call bell/phone within reach;with bed alarm set;with family/visitor present   Nurse Communication Mobility status        Time: 0347-4259 OT Time Calculation (min): 16 min  Charges: OT General Charges $OT Visit: 1 Visit OT Evaluation $OT Eval Moderate Complexity: 1 Mod OT Treatments $Self Care/Home Management : 8-22 mins $Therapeutic Activity: 8-22 mins  Malijah Lietz, OTD, OTR/L Acute Rehab [phone]  [name] 05/06/2022, 4:27 PM

## 2022-05-06 NOTE — Evaluation (Signed)
Physical Therapy Evaluation Patient Details Name: [name] [name] MRN: [id number] DOB: 1934/02/21 Today's Date: 05/06/2022  History of Present Illness  [name] [name] is a 88 y.o. female presenting with dysphagia. MRI postive for acute R MCA infarct, CT for PE. Recent hospital admission in [facility] 7/29. PMH: stroke with left-sided spastic hemiparesis, CABG, hypertension, chronic diastolic heart failure, and hypothyroidism   Clinical Impression  Pt admitted with above. At baseline pt was maxA for transfers and ADLs, and w/c dependent. Pt was feeding self but now no longer doing that. Per son pt with worsening cognition however pt alert and oriented to self, place, and date and did communicate with PT. Pt with functional and cognitive decline and would recommend SNF upon d/c to all for increased time to achieve PLOF. Family has been providing 24/7 care to patient however unsure if they can provide the increased level of care the patient requires. Acute PT to cont to follow.       Recommendations for follow up therapy are one component of a multi-disciplinary discharge planning process, led by the attending physician.  Recommendations may be updated based on patient status, additional functional criteria and insurance authorization.  Follow Up Recommendations Skilled nursing-short term rehab (<3 hours/day) Can patient physically be transported by private vehicle: No    Assistance Recommended at Discharge Frequent or constant Supervision/Assistance  Patient can return home with the following  A lot of help with walking and/or transfers;A lot of help with bathing/dressing/bathroom;Direct supervision/assist for medications management;Direct supervision/assist for financial management;Assist for transportation;Help with stairs or ramp for entrance;Assistance with feeding;Assistance with cooking/housework    Equipment Recommendations  (hoyer lift)  Recommendations for Other Services       Functional  Status Assessment Patient has had a recent decline in their functional status and demonstrates the ability to make significant improvements in function in a reasonable and predictable amount of time.     Precautions / Restrictions Precautions Precautions: Fall Precaution Comments: L hemiparesis Restrictions Weight Bearing Restrictions: No      Mobility  Bed Mobility Overal bed mobility: Needs Assistance Bed Mobility: Rolling, Sidelying to Sit Rolling: Mod assist Sidelying to sit: Max assist       General bed mobility comments: pt with good initiation with R UE to reach across her body to pull on bed rail to roll to the L, maxA for trunk elevation    Transfers Overall transfer level: Needs assistance   Transfers: Sit to/from Stand, Bed to chair/wheelchair/BSC Sit to Stand: Max assist, +2 safety/equipment Stand pivot transfers: Max assist, +2 safety/equipment         General transfer comment: pt with good anterior translation to initiate standing, R knee blocked and pt push up onto R LE and pulled up on PT with R UE. pt pvted on ball of R foot to chair    Ambulation/Gait               General Gait Details: pt non-ambulatory  Stairs            Wheelchair Mobility    Modified Rankin (Stroke Patients Only) Modified Rankin (Stroke Patients Only) Pre-Morbid Rankin Score: Severe disability Modified Rankin: Severe disability     Balance Overall balance assessment: Needs assistance Sitting-balance support: Feet supported, Single extremity supported Sitting balance-Leahy Scale: Poor Sitting balance - Comments: pt able to maintain static sitting balance with R UE <15 sec prior to R lateral lean   Standing balance support: During functional activity Standing balance-Leahy Scale:  Zero Standing balance comment: dependent on external support                             Pertinent Vitals/Pain Pain Assessment Pain Assessment: No/denies pain     Home Living Family/patient expects to be discharged to:: Private residence Living Arrangements: Children (lives with dtr) Available Help at Discharge: Family;Available 24 hours/day Type of Home: House                  Prior Function Prior Level of Function : Needs assist             Mobility Comments: dependent for transfers by family, std pvt xfers to/from w/c/bed/recliner. pt non-ambulatory ADLs Comments: dependent, dtr performs bed bath. with set up pt was feeding herself with her R hand however over the last 2 weeks pt hasn't wanted to eat     Hand Dominance   Dominant Hand: Right    Extremity/Trunk Assessment   Upper Extremity Assessment Upper Extremity Assessment: LUE deficits/detail LUE Deficits / Details: residual L elbow flex contracture    Lower Extremity Assessment Lower Extremity Assessment: LLE deficits/detail LLE Deficits / Details: spastic tone, no active/volitional movement of L LE    Cervical / Trunk Assessment Cervical / Trunk Assessment: Kyphotic  Communication   Communication: Expressive difficulties (soft spoken, at time muffled)  Cognition Arousal/Alertness: Awake/alert Behavior During Therapy: Flat affect (tearful, son reports this to be her baseline) Overall Cognitive Status: History of cognitive impairments - at baseline                                 General Comments: per son pt with memory deficits from previous cva. pt stated she was in a hospital and the date in addition to her name. Pt followed all simple commands however very flat with depressed mood and episodes of crying t/o session        General Comments General comments (skin integrity, edema, etc.): pt drooling, grits still in mouth, pt did use R hand to wipe her mouth when given cues    Exercises     Assessment/Plan    PT Assessment Patient needs continued PT services  PT Problem List Decreased strength;Decreased activity tolerance;Decreased  balance;Decreased mobility;Decreased cognition       PT Treatment Interventions DME instruction;Functional mobility training;Therapeutic activities;Balance training;Therapeutic exercise;Neuromuscular re-education;Cognitive remediation;Patient/family education    PT Goals (Current goals can be found in the Care Plan section)  Acute Rehab PT Goals Patient Stated Goal: unable to state PT Goal Formulation: With patient Time For Goal Achievement: 05/20/22 Potential to Achieve Goals: Fair    Frequency Min 3X/week     Co-evaluation               AM-PAC PT "6 Clicks" Mobility  Outcome Measure Help needed turning from your back to your side while in a flat bed without using bedrails?: A Lot Help needed moving from lying on your back to sitting on the side of a flat bed without using bedrails?: A Lot Help needed moving to and from a bed to a chair (including a wheelchair)?: A Lot Help needed standing up from a chair using your arms (e.g., wheelchair or bedside chair)?: A Lot Help needed to walk in hospital room?: Total Help needed climbing 3-5 steps with a railing? : Total 6 Click Score: 10    End of Session Equipment  Utilized During Treatment: Gait belt Activity Tolerance: Patient tolerated treatment well Patient left: in chair;with call bell/phone within reach;with chair alarm set;with family/visitor present Nurse Communication: Mobility status PT Visit Diagnosis: Muscle weakness (generalized) (M62.81)    Time: 6063-0160 PT Time Calculation (min) (ACUTE ONLY): 27 min   Charges:   PT Evaluation $PT Eval Moderate Complexity: 1 Mod PT Treatments $Therapeutic Activity: 8-22 mins        Lewis Shock, PT, DPT Acute Rehabilitation Services Secure chat preferred Office #: [phone]   Iona Hansen 05/06/2022, 12:59 PM

## 2022-05-06 NOTE — Consult Note (Signed)
Consultation Note Date: 05/06/2022   Patient Name: [name] [name]  DOB: 1933-12-01  MRN: [id number]  Age / Sex: 88 y.o., female  PCP: Oneal Grout, FNP Referring Physician: Tyrone Nine, MD  Reason for Consultation: Establishing goals of care and Psychosocial/spiritual support  HPI/Patient Profile: 88 y.o. female   admitted on 05/04/2022  with concerns of dysphagia. She was recently admitted to [facility] for hypokalemia but the morning after being discharged, she was found to be drooling and unable to take her pills or swallow her breakfast. MRI showed acute R MCA infarct involving the R insula nd overlying R frontal lobe.   CT Chest also revealed large PE. PMH includes: stroke with L-sided hemiparesis, h/o feeding tube (removed earlier this year), CABG, HTN, CHF, hypothyroidism  Admitted for treatment and stabilization.  Patient and her family face treatment option decisions, advanced directive decisions and anticipatory care needs.     Clinical Assessment and Goals of Care:  This NP Lorinda Creed reviewed medical records, received report from team, assessed the patient and then meet at the patient's bedside along with her 3 children to include Berenice Primas, Junious Silk at [facility] to discuss diagnosis, prognosis, GOC, EOL wishes disposition and options.   Concept of Palliative Care was introduced as specialized medical care for people and their families living with serious illness.  If focuses on providing relief from the symptoms and stress of a serious illness.  The goal is to improve quality of life for both the patient and the family.  Values and goals of care important to patient and family were attempted to be elicited.  Created space and opportunity for patient  and family to explore thoughts and feelings regarding current medical situation.  Patient and her family verbalize the seriousness  of her current medical situation secondary to stroke and concern for GI bleed and PE.  Main concern today for patient and her daughter/Sierra is lack of support in the home.  Patient is having increasing nursing care needs and is hopeful for some in-home services.  Patient has Medicare and Medicaid.  I discussed with the patient's daughter that outpatient palliative has made multiple attempts  to speak with and visit with patient without success secondary to inability to contact by phone or leave a message.  Offered education on importance of contacting social services to apply for in-home aide with [facility] or  PCS services.     A  discussion was had today regarding advanced directives.  Concepts specific to code status, artifical feeding and hydration, continued IV antibiotics and rehospitalization was had.    Education offered specifically on risks and benefits of artificial feeding and hydration.  Patient and family report that she did have a feeding tube in the past status post stroke in October 2022.  Patient would like to avoid PEG tube if at all possible  The difference between a aggressive medical intervention path  and a palliative comfort care path for this patient at this time was had.  MOST form introduced and a hard  copy left with family for review    Questions and concerns addressed.  Patient  encouraged to call with questions or concerns.     PMT will continue to support holistically.           No documented H POA or advanced care planning documents.  Patient has 4 living children.  Today patient verbalizes her desire for her children to work in [employer] in the event that medical decisions need to be made and patient is without capacity.     SUMMARY OF RECOMMENDATIONS    Code Status/Advance Care Planning: DNR   Symptom Management:  Dysphagia: Diet recommendation per speech pathology is thin liquids. Of note patient has a history of feeding tube in October 2022  status post stroke  Palliative Prophylaxis:  Aspiration, Bowel Regimen, Delirium Protocol, Frequent Pain Assessment, and Oral Care  Additional Recommendations (Limitations, Scope, Preferences): Full Scope Treatment  Psycho-social/Spiritual:  Desire for further Chaplaincy support: Additional Recommendations: Education on Hospice  Prognosis:  Unable to determine  Discharge Planning: To Be Determined      Primary Diagnoses: Present on Admission:  Slurred speech  Acute right MCA stroke (HCC)  Acute pulmonary embolism (HCC)  Essential hypertension  Chronic diastolic heart failure (HCC)  Hypothyroidism  Anemia  Acute CVA (cerebrovascular accident) (HCC)   I have reviewed the medical record, interviewed the patient and family, and examined the patient. The following aspects are pertinent.  Past Medical History:  Diagnosis Date   Ankle edema    Arthritis    right hip   Bronchitis    in Dec   CAD (coronary artery disease)    a. s/p DES to LAD in 2008, NST in 2015 and 2017 which were low-risk   Carotid stenosis    mild   Cataract    left;not ready to come off   Constipation    takes OTC stool softener as needed   Diabetes mellitus    pt doesn't take any meds for it   DJD (degenerative joint disease) of hip    right   Dry cough    GERD (gastroesophageal reflux disease)    Headache(784.0)    occasionally   HTN (hypertension)    takes Amlodpine and Carvedilol daily   Hx of cardiovascular stress test    Lexiscan Myoview (04/2014):  No ischemia, EF 64%, Low Risk   Hypercholesteremia    takes Crestor daily   Hypothyroidism    takes Synthroid daily   Ileus, postoperative (HCC) 05/12/2014   Myocardial infarction [facility])    pt unsure but thinks about 1yrs ago;is taking Plavix daily    Nocturia    Osteoarthritis of left hip 05/09/2014   Primary osteoarthritis of right hip 10/27/2011   Social History   Socioeconomic History   Marital status: Widowed    Spouse name: Not on  file   Number of children: Not on file   Years of education: Not on file   Highest education level: Not on file  Occupational History   Not on file  Tobacco Use   Smoking status: Never   Smokeless tobacco: Never  Vaping Use   Vaping Use: Never used  Substance and Sexual Activity   Alcohol use: No   Drug use: No   Sexual activity: Never    Birth control/protection: Surgical  Other Topics Concern   Not on file  Social History Narrative   Not on file   Social Determinants of Health  Financial Resource Strain: Not on file  Food Insecurity: Not on file  Transportation Needs: Not on file  Physical Activity: Not on file  Stress: Not on file  Social Connections: Not on file   Family History  Problem Relation Age of Onset   Hypertension Mother    Stroke Mother    Hypertension Father    Heart attack Brother    Heart attack Sister    Hypertension Sister    Hypertension Brother    Hypertension Daughter    Hypertension Son    Hyperlipidemia Sister    Diabetes Brother    Anesthesia problems Neg Hx    Hypotension Neg Hx    Malignant hyperthermia Neg Hx    Pseudochol deficiency Neg Hx    Scheduled Meds:  mouth rinse  15 mL Mouth Rinse 4 times per day   sodium chloride flush  3 mL Intravenous Once   Continuous Infusions:  sodium chloride 45 mL/hr at 05/05/22 2351   heparin 700 Units/hr (05/05/22 2253)   PRN Meds:.acetaminophen **OR** acetaminophen (TYLENOL) oral liquid 160 mg/5 mL **OR** acetaminophen, mouth rinse Medications Prior to Admission:  Prior to Admission medications   Medication Sig Start Date End Date Taking? Authorizing Provider  amLODipine (NORVASC) 10 MG tablet Take 1 tablet (10 mg total) by mouth daily. 09/28/19   Netta Neat., NP  aspirin EC 81 MG tablet Take 1 tablet (81 mg total) by mouth daily. Swallow whole. 01/31/22   Strader, Lennart Pall, PA-C  clopidogrel (PLAVIX) 75 MG tablet TAKE 1 TABLET BY MOUTH ONCE DAILY. Patient taking differently:  Take 75 mg by mouth daily. 10/10/19   Laqueta Linden, MD  cyclobenzaprine (FLEXERIL) 5 MG tablet Take 5 mg by mouth 2 (two) times daily. 04/10/22   [provider]  diclofenac Sodium (VOLTAREN) 1 % GEL Apply 1 Application topically daily as needed for pain. 04/07/22   [provider]  enalapril (VASOTEC) 20 MG tablet TAKE 1 TABLET BY MOUTH ONCE DAILY. 03/07/20   Laqueta Linden, MD  esomeprazole (NEXIUM) 40 MG capsule Take 40 mg by mouth daily. 04/10/22   [provider]  ezetimibe (ZETIA) 10 MG tablet TAKE 1 TABLET BY MOUTH ONCE DAILY. Patient taking differently: Take 10 mg by mouth daily. 11/30/19   Laqueta Linden, MD  fluticasone (FLONASE) 50 MCG/ACT nasal spray Place 1 spray into both nostrils daily. Patient taking differently: Place 1 spray into both nostrils daily as needed for allergies or rhinitis. 04/28/20   Maxwell Caul, PA-C  gabapentin (NEURONTIN) 100 MG capsule Take 100 mg by mouth 2 (two) times daily. 04/10/22   [provider]  isosorbide mononitrate (IMDUR) 30 MG 24 hr tablet TAKE 1 TABLET BY MOUTH ONCE DAILY. Patient taking differently: Take 30 mg by mouth daily. 03/08/20   Laqueta Linden, MD  levothyroxine (SYNTHROID) 75 MCG tablet Take 75 mcg by mouth daily. 04/10/22   [provider]  nitroGLYCERIN (NITROSTAT) 0.4 MG SL tablet Place 1 tablet (0.4 mg total) under the tongue every 5 (five) minutes as needed for chest pain. 10/25/20 06/14/22  Antoine Poche, MD  potassium chloride (KLOR-CON) 20 MEQ packet Take 20 mEq by mouth daily. 04/03/22   [provider]  rosuvastatin (CRESTOR) 40 MG tablet TAKE 1 TABLET BY MOUTH ONCE DAILY. Patient taking differently: Take 40 mg by mouth daily. 11/30/19   Laqueta Linden, MD  spironolactone (ALDACTONE) 25 MG tablet TAKE 1 TABLET BY MOUTH ONCE DAILY. Patient taking  differently: Take 25 mg by mouth daily. 10/10/19   Laqueta Linden, MD   No Known Allergies Review of  Systems  Neurological:  Positive for weakness.    Physical Exam Cardiovascular:     Rate and Rhythm: Normal rate.  Pulmonary:     Effort: Pulmonary effort is normal.  Skin:    General: Skin is warm and dry.  Neurological:     Mental Status: She is alert.     Vital Signs: BP (!) 168/94 (BP Location: Left Arm)   Pulse 89   Temp 98.3 F (36.8 C)   Resp 18   Ht 5\' 1"  (1.549 m)   Wt 66.7 kg   SpO2 100%   BMI 27.78 kg/m  Pain Scale: 0-10   Pain Score: 0-No pain   SpO2: SpO2: 100 % O2 Device:SpO2: 100 % O2 Flow Rate: .   IO: Intake/output summary:  Intake/Output Summary (Last 24 hours) at 05/06/2022 1059 Last data filed at 05/06/2022 07/06/2022 Gross per 24 hour  Intake 1252.67 ml  Output --  Net 1252.67 ml    LBM: Last BM Date : 05/05/22 Baseline Weight: Weight: 66.7 kg Most recent weight: Weight: 66.7 kg     Palliative Assessment/Data:    Discussed with Dr. 05/07/22 and bedside RN and Speech Pathologist  Signed by: Jarvis Newcomer, NP   Please contact Palliative Medicine Team phone at [phone] for questions and concerns.  For individual provider: See [phone]

## 2022-05-06 NOTE — Plan of Care (Signed)
  Problem: Education: Goal: Knowledge of General Education information will improve Description: Including pain rating scale, medication(s)/side effects and non-pharmacologic comfort measures Outcome: Progressing   Problem: Health Behavior/Discharge Planning: Goal: Ability to manage health-related needs will improve Outcome: Progressing   Problem: Clinical Measurements: Goal: Ability to maintain clinical measurements within normal limits will improve Outcome: Progressing Goal: Will remain free from infection Outcome: Progressing Goal: Diagnostic test results will improve Outcome: Progressing Goal: Respiratory complications will improve Outcome: Progressing Goal: Cardiovascular complication will be avoided Outcome: Progressing   Problem: Activity: Goal: Risk for activity intolerance will decrease Outcome: Progressing   Problem: Nutrition: Goal: Adequate nutrition will be maintained Outcome: Progressing   Problem: Coping: Goal: Level of anxiety will decrease Outcome: Progressing   Problem: Elimination: Goal: Will not experience complications related to bowel motility Outcome: Progressing Goal: Will not experience complications related to urinary retention Outcome: Progressing   Problem: Pain Managment: Goal: General experience of comfort will improve Outcome: Progressing   Problem: Safety: Goal: Ability to remain free from injury will improve Outcome: Progressing   Problem: Skin Integrity: Goal: Risk for impaired skin integrity will decrease Outcome: Progressing   Problem: Education: Goal: Knowledge of secondary prevention will improve (SELECT ALL) Outcome: Progressing Goal: Knowledge of patient specific risk factors will improve (INDIVIDUALIZE FOR PATIENT) Outcome: Progressing   

## 2022-05-06 NOTE — Progress Notes (Signed)
ANTICOAGULATION CONSULT NOTE - Follow Up Consult  Pharmacy Consult for Heparin Indication: pulmonary embolus  No Known Allergies  Patient Measurements: Height: 5\' 1"  (154.9 cm) Weight: 66.7 kg (147 lb 0.8 oz) IBW/kg (Calculated) : 47.8 Heparin Dosing Weight: 61.8 kg  Vital Signs: Temp: 99 F (37.2 C) (07/31 2354) Temp Source: Oral (07/31 2354) BP: 137/73 (07/31 2354) Pulse Rate: 72 (07/31 2354)  Labs: Recent Labs    05/04/22 1359 05/04/22 1421 05/04/22 1949 05/05/22 0328 05/05/22 0701 05/05/22 1552 05/06/22 0241  HGB 10.9*  --   --  9.8*  --   --  9.1*  HCT 33.7*  --   --  30.3*  --   --  27.9*  PLT 250  --   --  231  --   --  245  APTT 35  --   --   --   --   --   --   LABPROT 14.8  --   --   --   --   --   --   INR 1.2  --   --   --   --   --   --   HEPARINUNFRC  --   --   --   --  1.07* 0.71* 0.51  CREATININE 0.81  --   --   --   --   --   --   TROPONINIHS  --  17 14  --   --   --   --      Estimated Creatinine Clearance: 43.6 mL/min (by C-G formula based on SCr of 0.81 mg/dL).  Assessment: 38 yof with a history of CAD, GERD, HTN, hypothyroidism, MI. Patient is presenting with weakness and dysphagia. Heparin per pharmacy consult placed for pulmonary embolus.   CT Head w/ new small ill-defined hypodensity at the right cerebral peduncle may reflect acute infarct.   CT Chest with large bilateral central PE extending into lobar branches no RHS on CT   Per Neurology, use PE goal heparin level.   Patient is not on anticoagulation prior to arrival.  8/1 AM update:  Heparin level therapeutic    Goal of Therapy:  Heparin level 0.3-0.7 units/ml Monitor platelets by anticoagulation protocol: Yes   Plan:  Cont heparin 700 units/hr 1200 heparin level  88, PharmD, BCPS Clinical Pharmacist Phone: [phone]

## 2022-05-06 NOTE — Progress Notes (Signed)
ANTICOAGULATION CONSULT NOTE - Follow Up Consult  Pharmacy Consult for Heparin Indication: pulmonary embolus  No Known Allergies  Patient Measurements: Height: 5\' 1"  (154.9 cm) Weight: 66.7 kg (147 lb 0.8 oz) IBW/kg (Calculated) : 47.8 Heparin Dosing Weight: 61.8 kg  Vital Signs: Temp: 98.3 F (36.8 C) (08/01 0741) Temp Source: Oral (08/01 0448) BP: 168/94 (08/01 0741) Pulse Rate: 89 (08/01 0741)  Labs: Recent Labs    05/04/22 1359 05/04/22 1421 05/04/22 1949 05/05/22 0328 05/05/22 0701 05/05/22 1552 05/06/22 0241 05/06/22 1212  HGB 10.9*  --   --  9.8*  --   --  9.1*  --   HCT 33.7*  --   --  30.3*  --   --  27.9*  --   PLT 250  --   --  231  --   --  245  --   APTT 35  --   --   --   --   --   --   --   LABPROT 14.8  --   --   --   --   --   --   --   INR 1.2  --   --   --   --   --   --   --   HEPARINUNFRC  --   --   --   --    < > 0.71* 0.51 0.45  CREATININE 0.81  --   --   --   --   --  0.72  --   TROPONINIHS  --  17 14  --   --   --   --   --    < > = values in this interval not displayed.     Estimated Creatinine Clearance: 44.1 mL/min (by C-G formula based on SCr of 0.72 mg/dL).  Assessment: 30 yof with a history of CAD, GERD, HTN, hypothyroidism, MI. Patient is presenting with weakness and dysphagia. Heparin per pharmacy consult placed for pulmonary embolus.   CT Head w/ new small ill-defined hypodensity at the right cerebral peduncle may reflect acute infarct.   CT Chest with large bilateral central PE extending into lobar branches no RHS on CT   Per Neurology, use PE goal heparin level (0.3-0.7).   Patient is not on anticoagulation prior to arrival.  8/1 update: Heparin level is 0.45,  therapeutic x 2 on heparin drip 700 units/hr Hgb 9.8>9.1; plt  wnl stable, no bleeding reported    Goal of Therapy:  Heparin level 0.3-0.7 units/ml Monitor platelets by anticoagulation protocol: Yes   Plan:  Cont heparin 700 units/hr Daily HL and  CBC Monitor for s/sx of bleeding    Thank you for allowing pharmacy to be part of this patients care team.. 10/1, Noah Delaine Clinical Pharmacist [phone] 05/06/2022 2:30 PM  Please check AMION for all [facility] Pharmacy phone numbers After 10:00 PM, call Main Pharmacy [phone]

## 2022-05-06 NOTE — Evaluation (Signed)
Occupational Therapy Evaluation Patient Details Name: [name] [name] MRN: [id number] DOB: [birth year] Today's Date: 05/06/2022   History of Present Illness [name] [name] is a 88 y.o. female presenting with dysphagia. MRI postive for acute R MCA infarct, CT for PE. Recent hospital admission in [facility] 7/29. PMH: stroke with left-sided spastic hemiparesis, CABG, hypertension, chronic diastolic heart failure, and hypothyroidism   Clinical Impression   Pt dependent at baseline for ADLs at bed level, lives with daughter who assists her with transfers to w/c. Pt currently needing mod-total A for ADLs, mod-max A for bed mobility, and max A +2 for stand pivot transfers. Pt increased tone in LUE, no AROM noted, administered L hand palm guard for contracture prevention, will reassess in 2 hours to check for tolerance and skin breakdown. Pt presenting with impairments listed below, will follow acutely. Recommend SNF at d/c unless pt's family able to provide level of assist needed.      Recommendations for follow up therapy are one component of a multi-disciplinary discharge planning process, led by the attending physician.  Recommendations may be updated based on patient status, additional functional criteria and insurance authorization.   Follow Up Recommendations  Skilled nursing-short term rehab (<3 hours/day) (unless family able to provide needed level of assist)    Assistance Recommended at Discharge Frequent or constant Supervision/Assistance  Patient can return home with the following A lot of help with walking and/or transfers;A lot of help with bathing/dressing/bathroom;Assistance with feeding;Direct supervision/assist for medications management;Direct supervision/assist for financial management;Assist for transportation;Help with stairs or ramp for entrance;Assistance with cooking/housework    Functional Status Assessment  Patient has had a recent decline in their functional status and  demonstrates the ability to make significant improvements in function in a reasonable and predictable amount of time.  Equipment Recommendations  Other (comment) (hoyer lift)    Recommendations for Other Services PT consult     Precautions / Restrictions Precautions Precautions: Fall Precaution Comments: L hemiparesis Restrictions Weight Bearing Restrictions: No      Mobility Bed Mobility Overal bed mobility: Needs Assistance Bed Mobility: Rolling, Sit to Supine Rolling: Mod assist     Sit to supine: Max assist   General bed mobility comments: initiates rolling to R/L    Transfers Overall transfer level: Needs assistance   Transfers: Sit to/from Stand, Bed to chair/wheelchair/BSC Sit to Stand: Max assist, +2 safety/equipment Stand pivot transfers: Max assist, +2 safety/equipment                Balance Overall balance assessment: Needs assistance Sitting-balance support: Feet supported, Single extremity supported Sitting balance-Leahy Scale: Poor Sitting balance - Comments: pt able to maintain static sitting balance with R UE <15 sec prior to R lateral lean   Standing balance support: During functional activity Standing balance-Leahy Scale: Zero Standing balance comment: dependent on external support                           ADL either performed or assessed with clinical judgement   ADL Overall ADL's : Needs assistance/impaired Eating/Feeding: Maximal assistance   Grooming: Moderate assistance;Wash/dry hands Grooming Details (indicate cue type and reason): washes face sitting up in chair Upper Body Bathing: Maximal assistance;Bed level   Lower Body Bathing: Maximal assistance;Bed level   Upper Body Dressing : Maximal assistance;Bed level Upper Body Dressing Details (indicate cue type and reason): to don gown Lower Body Dressing: Maximal assistance;Bed level   Toilet Transfer: Maximal assistance;BSC/3in1;Squat-pivot;+2 for  physical  assistance   Toileting- Clothing Manipulation and Hygiene: Total assistance       Functional mobility during ADLs: Maximal assistance;+2 for physical assistance       Vision   Additional Comments: will further assess     Perception     Praxis      Pertinent Vitals/Pain Pain Assessment Pain Assessment: No/denies pain     Hand Dominance Right   Extremity/Trunk Assessment Upper Extremity Assessment Upper Extremity Assessment: LUE deficits/detail LUE Deficits / Details: increased tone throughout LUE, trace shoulder activation, no AROM noted at hand/wrist/elbow LUE Sensation: decreased proprioception;decreased light touch LUE Coordination: decreased fine motor;decreased gross motor   Lower Extremity Assessment Lower Extremity Assessment: Defer to PT evaluation LLE Deficits / Details: spastic tone, no active/volitional movement of L LE   Cervical / Trunk Assessment Cervical / Trunk Assessment: Kyphotic   Communication Communication Communication: Expressive difficulties (muffled responses)   Cognition Arousal/Alertness: Awake/alert Behavior During Therapy: Flat affect Overall Cognitive Status: History of cognitive impairments - at baseline                                 General Comments: per son pt with memory deficits from previous cva. pt stated she was in a hospital and the date in addition to her name. Pt followed all simple commands     General Comments  pt drooling throughout session    Exercises     Shoulder Instructions      Home Living Family/patient expects to be discharged to:: Private residence Living Arrangements: Children (daughter) Available Help at Discharge: Family;Available 24 hours/day Type of Home: House Home Access: Ramped entrance     Home Layout: Laundry or work area in basement;Two level               Home Equipment: Agricultural consultant (2 wheels);Wheelchair - manual      Lives With: Daughter    Prior  Functioning/Environment Prior Level of Function : Needs assist             Mobility Comments: dependent for transfers by family, std pvt xfers to/from w/c/bed/recliner. pt non-ambulatory ADLs Comments: dependent, dtr performs bed bath. with set up pt was feeding herself with her R hand however over the last 2 weeks pt hasn't wanted to eat        OT Problem List: Decreased range of motion;Decreased strength;Decreased activity tolerance;Impaired balance (sitting and/or standing);Decreased cognition;Decreased safety awareness;Impaired UE functional use;Impaired tone;Impaired sensation      OT Treatment/Interventions: Self-care/ADL training;Therapeutic exercise;Energy conservation;DME and/or AE instruction;Therapeutic activities;Patient/family education;Balance training;Visual/perceptual remediation/compensation;Cognitive remediation/compensation    OT Goals(Current goals can be found in the care plan section) Acute Rehab OT Goals Patient Stated Goal: none stated OT Goal Formulation: Patient unable to participate in goal setting Time For Goal Achievement: 05/20/22 Potential to Achieve Goals: Fair ADL Goals Pt Will Perform Eating: with min assist;sitting;with adaptive utensils Pt Will Perform Grooming: with supervision;bed level Additional ADL Goal #1: pt will perform bed mobility with mod A in prep for ADLs Additional ADL Goal #2: Pt will tolerate L palm guard wear schedule in order to decrease contracture risk  OT Frequency: Min 2X/week    Co-evaluation              AM-PAC OT "6 Clicks" Daily Activity     Outcome Measure Help from another person eating meals?: A Lot Help from another person taking care of personal grooming?: A  Lot Help from another person toileting, which includes using toliet, bedpan, or urinal?: Total Help from another person bathing (including washing, rinsing, drying)?: A Lot Help from another person to put on and taking off regular upper body clothing?: A  Lot Help from another person to put on and taking off regular lower body clothing?: Total 6 Click Score: 10   End of Session Equipment Utilized During Treatment: Gait belt Nurse Communication: Mobility status  Activity Tolerance: Patient tolerated treatment well Patient left: in bed;with call bell/phone within reach;with bed alarm set;with family/visitor present  OT Visit Diagnosis: Other abnormalities of gait and mobility (R26.89);Unsteadiness on feet (R26.81);Muscle weakness (generalized) (M62.81);Other symptoms and signs involving cognitive function;Other symptoms and signs involving the nervous system (R29.898)                Time: 3845-3646 OT Time Calculation (min): 36 min Charges:  OT General Charges $OT Visit: 1 Visit OT Evaluation $OT Eval Moderate Complexity: 1 Mod OT Treatments $Self Care/Home Management : 8-22 mins  [name], OTD, OTR/L Acute Rehab [phone]   [name] 05/06/2022, 3:59 PM

## 2022-05-06 NOTE — Progress Notes (Signed)
STROKE TEAM PROGRESS NOTE   INTERVAL HISTORY Patient is seen in her room with multiple family members at the bedside. .  Neurological exam remains unchanged.  Vital signs are stable.  Heparin level is in therapeutic range and pharmacy is managing.    Vitals:   05/05/22 2354 05/06/22 0448 05/06/22 0741 05/06/22 1523  BP: 137/73 116/67 (!) 168/94 117/60  Pulse: 72 (!) 58 89 64  Resp: 18 18 18 18   Temp: 99 F (37.2 C) 98.3 F (36.8 C) 98.3 F (36.8 C) 98.3 F (36.8 C)  TempSrc: Oral Oral    SpO2: 98% 97% 100% 97%  Weight:      Height:       CBC:  Recent Labs  Lab 05/04/22 1359 05/05/22 0328 05/06/22 0241  WBC 11.3* 10.9* 11.4*  NEUTROABS 8.1*  --   --   HGB 10.9* 9.8* 9.1*  HCT 33.7* 30.3* 27.9*  MCV 90.3 88.9 90.0  PLT 250 231 245   Basic Metabolic Panel:  Recent Labs  Lab 05/04/22 1359 05/04/22 1421 05/06/22 0241  NA 141  --  143  K 3.9  --  2.7*  CL 106  --  106  CO2 28  --  24  GLUCOSE 113*  --  80  BUN 8  --  5*  CREATININE 0.81  --  0.72  CALCIUM 8.9  --  8.4*  MG  --  1.9 1.6*   Lipid Panel:  Recent Labs  Lab 05/05/22 0328  CHOL 87  TRIG 59  HDL 33*  CHOLHDL 2.6  VLDL 12  LDLCALC 42   HgbA1c:  Recent Labs  Lab 05/04/22 1359  HGBA1C 6.0*   Urine Drug Screen: No results for input(s): "LABOPIA", "COCAINSCRNUR", "LABBENZ", "AMPHETMU", "THCU", "LABBARB" in the last 168 hours.  Alcohol Level No results for input(s): "ETH" in the last 168 hours.  IMAGING past 24 hours No results found.  PHYSICAL EXAM General:  Alert, well-developed, well-nourished elderly African-American lady  in no acute distress Respiratory:  Regular, unlabored respirations on room air  NEURO:  Mental Status: AA&Ox3  Speech/Language: Mild dysarthria but no aphasia fluency, and comprehension intact.  Cranial Nerves:  II: PERRL. Visual fields full, does not blink to threat on the left III, IV, VI: Right gaze preference but can cross midline V: Sensation is intact to  light touch and symmetrical to face.  VII: Smile is asymmetrical.  With left lower facial weakness VIII: hearing intact to voice. IX, X: Phonation is normal.  XII: tongue is midline without fasciculations. Motor: 5/5 strength to RUE and RLE, 0/5 to LUE, 1/5 to LLE  Tone: is normal and bulk is normal Sensation- Intact to light touch bilaterally.  Gait- deferred    ASSESSMENT/PLAN [name] [name] is a 88 y.o. female with history of arthritis, CAD with stenting, DM, HTN, HLD, stroke and MI presenting with left sided weakness and left sided facial droop which have been present since she was diagnosed with a stroke several days ago at an outside hospital as well as new onset dysphagia.  Outside hospital records are not available.  She was found to have an acute PE and will need to be anticoagulated.  Stroke:  right MCA subacute stroke with worsening of dysphagia Etiology:  possibly embolic from cryptogenic source New diagnosis of bilateral pulmonary emboli requiring anticoagulation CT head New hypodensity at right cerebral peduncle, chronic right frontal lobe infarct CTA head & neck atheromatous plaque on bilateral carotid siphons, no LVO,  moderate right PCA stenosis MRI  acute right MCA infarct involving insula and right frontal lobe, atrophy and chronic microvascular ischemic disease 2D Echo ejection fraction 60 to 65%.  LDL 42 HgbA1c 6.0 VTE prophylaxis - fully anticoagulated with heparin    Diet   Diet full liquid Room service appropriate? Yes; Fluid consistency: Thin   aspirin 81 mg daily and clopidogrel 75 mg daily prior to admission, now on heparin IV.  Therapy recommendations:  pending Disposition:  pending  Hypertension Home meds:  amlodipine 10 mg daily, enalapril 20 mg daily Stable Permissive hypertension (OK if < 220/120) but gradually normalize in 5-7 days Long-term BP goal normotensive  Hyperlipidemia Home meds:  rosuvastatin 40 mg daily, will resume after swallow  evaluation LDL 42, goal < 70 Continue statin at discharge  Diabetes type II Controlled Home meds:  none HgbA1c 6.0, goal < 7.0 CBGs Recent Labs    05/05/22 1612  GLUCAP 76    SSI  Pulmonary embolus Large bilateral PE seen on CT chest Anticoagulation with IV heparin  Dysphagia SLP consult for swallow evaluation NPO for now  Other Stroke Risk Factors Advanced Age >/= 28  Hx stroke Coronary artery disease  Other Active Problems none  Hospital day # 1    Patient with recent right frontal MCA branch infarct of cryptogenic etiology with left hemiparesis was discharged from [facility] but 1 day after returning home developed swallowing difficulties prompting readmission this time to [facility] and her work-up is also showing bilateral peripheral pulmonary emboli requiring anticoagulation with IV heparin now.  Recommend continue ongoing physical, occupational and speech therapies.  May need eventual transition to oral anticoagulation at the time of discharge likely to rehab.  No need to do prolonged cardiac monitoring for A-fib as patient has an indication for long-term anticoagulation with her pulmonary emboli.  Long discussion with patient and daughter at bedside and answered questions.  Stroke team will sign off.  Kindly call for questions.  Discussed with Dr. Jarvis Newcomer.  Greater than 50% time during this 25-minute visit was spent in coordination of care about her embolic stroke and pulmonary embolism and discussion with patient and family and care team and answering questions  Delia Heady, MD Medical Director [facility] Pager: [phone] 05/06/2022 5:17 PM   To contact Stroke Continuity provider, please refer to [email or web address]. After hours, contact General Neurology yes, Judeth Cornfield Section  r

## 2022-05-06 NOTE — Progress Notes (Signed)
Speech Language Pathology Treatment: Dysphagia  Patient Details Name: [name] [name] MRN: [id number] DOB: 03-Aug-1934 Today's Date: 05/06/2022 Time: 9417-4081 SLP Time Calculation (min) (ACUTE ONLY): 22 min  Assessment / Plan / Recommendation Clinical Impression  Pt and her family report having trouble with meal trays yesterday since MBS was completed. They describe a lot of boluses escaping her mouth anteriorly, as well as occasional coughing. SLP provided education about swallowing strategies from MBS, but pt says that she is having a hard time using this consistently, and it seems like it is making it difficult for her to take in much. SLP provided trials of purees and thin liquids, with significant anterior spillage observed as well as prolonged oral transit and L buccal pocketing. Pt does seem to have a harder time implementing a second swallow continuously. Alternating with liquid washes does also help to clear some oral residue, and there is no coughing observed during trials with SLP. Discussed options with pt/family, including staying on current diet and trying to use strategies. Also discussed transitioning to full liquid diet, which might overall be easier to clear from her oral cavity and may not require as much consistent use of second swallows. They would all prefer a change to a full liquid diet. SLP will continue to follow with trials of purees administered during therapy to try to increase endurance and consistency of use prior to getting back on more solid diet.    HPI HPI: Pt is an 88 yo female presenting with concerns of dysphagia. She was recently admitted to [facility] for hypokalemia but the morning after being discharged, she was found to be drooling and unable to take her pills or swallow her breakfast. MRI showed acute R MCA infarct involving the R insula nd overlying R frontal lobe. CT Chest also revealed large PE. PMH includes: stroke with L-sided hemiparesis, h/o feeding  tube (removed earlier this year), CABG, HTN, CHF, hypothyroidism      SLP Plan  Continue with current plan of care      Recommendations for follow up therapy are one component of a multi-disciplinary discharge planning process, led by the attending physician.  Recommendations may be updated based on patient status, additional functional criteria and insurance authorization.    Recommendations  Diet recommendations: Thin liquid Liquids provided via: Straw Medication Administration: Crushed with puree Supervision: Staff to assist with self feeding Compensations: Minimize environmental distractions;Slow rate;Small sips/bites;Multiple dry swallows after each bite/sip Postural Changes and/or Swallow Maneuvers: Seated upright 90 degrees                Oral Care Recommendations: Oral care QID Follow Up Recommendations: Acute inpatient rehab (3hours/day) Assistance recommended at discharge: Frequent or constant Supervision/Assistance SLP Visit Diagnosis: Dysphagia, unspecified (R13.10) Plan: Continue with current plan of care           Mahala Menghini., M.A. CCC-SLP Acute Rehabilitation Services Office [phone]  Secure chat preferred   05/06/2022, 12:12 PM

## 2022-05-07 DIAGNOSIS — I63511 Cerebral infarction due to unspecified occlusion or stenosis of right middle cerebral artery: Secondary | ICD-10-CM | POA: Diagnosis not present

## 2022-05-07 DIAGNOSIS — I5032 Chronic diastolic (congestive) heart failure: Secondary | ICD-10-CM | POA: Diagnosis not present

## 2022-05-07 DIAGNOSIS — I2699 Other pulmonary embolism without acute cor pulmonale: Secondary | ICD-10-CM | POA: Diagnosis not present

## 2022-05-07 DIAGNOSIS — D649 Anemia, unspecified: Secondary | ICD-10-CM | POA: Diagnosis not present

## 2022-05-07 LAB — RETICULOCYTES
Immature Retic Fract: 11.9 % (ref 2.3–15.9)
RBC.: 2.74 MIL/uL — ABNORMAL LOW (ref 3.87–5.11)
Retic Count, Absolute: 48.8 10*3/uL (ref 19.0–186.0)
Retic Ct Pct: 1.8 % (ref 0.4–3.1)

## 2022-05-07 LAB — BASIC METABOLIC PANEL
Anion gap: 4 — ABNORMAL LOW (ref 5–15)
Anion gap: 10 (ref 5–15)
BUN: <5 mg/dL — ABNORMAL LOW (ref 8–23)
BUN: <5 mg/dL — ABNORMAL LOW (ref 8–23)
CO2: 27 mmol/L (ref 22–32)
CO2: 25 mmol/L (ref 22–32)
Calcium: 7.9 mg/dL — ABNORMAL LOW (ref 8.9–10.3)
Calcium: 8 mg/dL — ABNORMAL LOW (ref 8.9–10.3)
Chloride: 109 mmol/L (ref 98–111)
Chloride: 105 mmol/L (ref 98–111)
Creatinine, Ser: 0.63 mg/dL (ref 0.44–1.00)
Creatinine, Ser: 0.7 mg/dL (ref 0.44–1.00)
GFR, Estimated: >60 mL/min (ref >60)
GFR, Estimated: >60 mL/min (ref >60)
Glucose, Bld: 80 mg/dL (ref 70–99)
Glucose, Bld: 121 mg/dL — ABNORMAL HIGH (ref 70–99)
Potassium: 2.7 mmol/L — CL (ref 3.5–5.1)
Potassium: 3.1 mmol/L — ABNORMAL LOW (ref 3.5–5.1)
Sodium: 140 mmol/L (ref 135–145)
Sodium: 140 mmol/L (ref 135–145)

## 2022-05-07 LAB — CBC
HCT: 24.4 % — ABNORMAL LOW (ref 36.0–46.0)
Hemoglobin: 7.9 g/dL — ABNORMAL LOW (ref 12.0–15.0)
MCH: 28.9 pg (ref 26.0–34.0)
MCHC: 32.4 g/dL (ref 30.0–36.0)
MCV: 89.4 fL (ref 80.0–100.0)
Platelets: 244 10*3/uL (ref 150–400)
RBC: 2.73 MIL/uL — ABNORMAL LOW (ref 3.87–5.11)
RDW: 14.7 % (ref 11.5–15.5)
WBC: 7.1 10*3/uL (ref 4.0–10.5)
nRBC: 0 % (ref 0.0–0.2)

## 2022-05-07 LAB — VITAMIN B12: Vitamin B-12: 812 pg/mL (ref 180–914)

## 2022-05-07 LAB — IRON AND TIBC
Iron: 80 ug/dL (ref 28–170)
Saturation Ratios: 65 % — ABNORMAL HIGH (ref 10.4–31.8)
TIBC: 123 ug/dL — ABNORMAL LOW (ref 250–450)
UIBC: 43 ug/dL

## 2022-05-07 LAB — MAGNESIUM: Magnesium: 2.4 mg/dL (ref 1.7–2.4)

## 2022-05-07 LAB — FERRITIN: Ferritin: 491 ng/mL — ABNORMAL HIGH (ref 11–307)

## 2022-05-07 LAB — FOLATE: Folate: 8.6 ng/mL (ref >5.9)

## 2022-05-07 LAB — HEPARIN LEVEL (UNFRACTIONATED): Heparin Unfractionated: 0.35 IU/mL (ref 0.30–0.70)

## 2022-05-07 MED ORDER — ROSUVASTATIN CALCIUM 20 MG PO TABS
40.0000 mg | ORAL_TABLET | Freq: Every day | ORAL | Status: DC
  Administered 2022-05-07 – 2022-05-12 (×6): 40 mg via ORAL
  Filled 2022-05-07 (×6): qty 2

## 2022-05-07 MED ORDER — MAGNESIUM SULFATE 2 GM/50ML IV SOLN
2.0000 g | Freq: Once | INTRAVENOUS | Status: AC
Start: 2022-05-07 — End: 2022-05-08
  Administered 2022-05-07: 2 g via INTRAVENOUS
  Filled 2022-05-07: qty 50

## 2022-05-07 MED ORDER — POTASSIUM CHLORIDE CRYS ER 20 MEQ PO TBCR
40.0000 meq | EXTENDED_RELEASE_TABLET | Freq: Once | ORAL | Status: AC
  Administered 2022-05-07: 40 meq via ORAL
  Filled 2022-05-07: qty 2

## 2022-05-07 MED ORDER — GABAPENTIN 100 MG PO CAPS
100.0000 mg | ORAL_CAPSULE | Freq: Two times a day (BID) | ORAL | Status: DC
  Administered 2022-05-07 – 2022-05-12 (×11): 100 mg via ORAL
  Filled 2022-05-07 (×11): qty 1

## 2022-05-07 MED ORDER — SODIUM CHLORIDE 0.45 % IV SOLN
INTRAVENOUS | Status: DC
  Filled 2022-05-07 (×3): qty 1000

## 2022-05-07 MED ORDER — PANTOPRAZOLE SODIUM 40 MG PO TBEC
40.0000 mg | DELAYED_RELEASE_TABLET | Freq: Every day | ORAL | Status: DC
  Administered 2022-05-07 – 2022-05-12 (×6): 40 mg via ORAL
  Filled 2022-05-07 (×6): qty 1

## 2022-05-07 NOTE — Progress Notes (Signed)
Date and time results received: 05/07/22 0804  Test: K+ Critical Value: 2.4  Name of Provider Notified: Dr. Jarvis Newcomer  Orders Received? Or Actions Taken?: Awaiting orders

## 2022-05-07 NOTE — Progress Notes (Signed)
Progress Note  Patient: [name] [name] YCX:[id number] DOB: Jan 04, 1934  DOA: 05/04/2022  DOS: 05/07/2022    Brief hospital course: 88 y.o. female with medical history significant of stroke on aspirin and Plavix left-sided hemiparesis, CABG, hypertension, chronic diastolic heart failure, and hypothyroidism who presents with concerns of dysphagia.   Patient was recently hospitalized at [facility] overnight about 2 days ago.  Unfortunately unable to see records.  She presented there with a headache and reportedly was admitted for hypokalemia.  Family believes a CT head scan was done and her headache also resolved at discharge.  The night following discharge she was doing well and able to swallow her pills.  Then this morning when she woke up daughter noticed she was drooling all over when she attempted to take her pills.  Then again when she tried eating breakfast prompting daughter to take her to the ED.  Patient denies any headache or blurry vision.  No chest pain, shortness of breath.  No nausea, vomiting. Daughter also notes that about 3 weeks ago she was prescribed potassium supplementation and had persistent dark-colored diarrhea.  This resolved intermittently in terms of both diarrhea and her stool returning to brown color, but again about 6 days ago there was another episode of dark stool.  She is on aspirin and Plavix for her CVA   In the ED, she was afebrile and normotensive on room air. Had mild leukocytosis of 11.3, hemoglobin 10.9 which appears to be downward trending 12.8 back in 06/2021.  No other significant electrolyte abnormalities.   EKG my review sinus rhythm and slight ST depression in V4 to V6 which is also seen on prior EKGs.     CT head with new small ill-defined hypodensity at the right cerebral peduncle. MRI brain revealing for acute right MCA distribution infarct involving the right insula and overlying the right frontal lobe.   CT chest with large bilateral central  pulmonary artery emboli extending into the lobar branch with no evidence of right heart strain and evidence of right heart strain.  Trace left pleural effusion with developing infarct not excluded.   ED PA discussed with neurology Dr. Otelia Limes and after extensive discussion with him and with patient and her family.  They have decided to initiate IV heparin for a PE in the setting of new stroke.  Hospitalist then consult for admission.  Assessment and Plan: Acute right MCA stroke: acute right MCA infarct involving insula and right frontal lobe, atrophy and chronic microvascular ischemic disease - On IV heparin (PTA was DAPT) due to PE.  - Continue telemetry, though pt has indication for anticoagulation regardless.  - LDL is 42, continue rosuvastatin 40mg  daily.  - Add back HTN medications slowly.  - PT/OT evaluations recommending SNF. TOC consulted. Note patient has significant assistance at home.    Possible acute blood loss anemia on anemia of chronic disease: Anemia panel consistent with chronic inflammation (TIBC low at 123, 65% saturation with ferritin of 491. Iron 80.   - Monitor H/H serially. Concern for cryptogenic bleeding. No BM recently though. Note BUN is 5 arguing against UGIB at this time, restarting home PPI. - Has no objection to transfusion, which was discussed with her and family.  - In setting of PE, will continue IV heparin for now. If FOBT is positive, would need work up.    Acute pulmonary embolism: -CT chest with contrast (not CTA) with large bilateral central pulmonary artery emboli extending into the lobar branch with no evidence  of right heart strain and evidence of right heart strain. I have reviewed this personally and agree with radiologist interpretation. Trace left pleural effusion with developing infarct not excluded. - Suspect due to patient being bedbound due to her previous stroke resulting in left-sided hemiparesis. It is peculiar that the patient has no dyspnea  or hypoxemia, though exertional capacity is really not tested. Echo noted to have normal RV. Pt isn't even tachycardic. Will check venous U/S of LE's and LUE. If no blood clot, may consider repeat imaging with CTA for better characterization. -Continue IV heparin, therapeutic, dosed per pharmacy.   Constipation:  - Start senna  GERD: Chronic, stable - Reordered PPI   Dysphagia:  - Dysphagia 1 and thin liquids, aspiration precautions per SLP. Hoping to avoid need for PEG. Palliative consultation appreciated, d/w Lorinda Creed, NP.  Hypokalemia: Refractory to replacement - Supplement enterally and in IVF, empiric Mg also to be given.  - Given severity and refractory nature, recheck this PM to guide further treatment.   Hypothyroidism - Continue synthroid   Slurred speech due to CVA:  - SLP evaluation appreciated.      Chronic HFpEF, CAD: Stable. 3-vessel calcification noted on CT, followed by [facility]. No recent angina or CHF symptoms.   Essential hypertension: - Allowing for permissive hypertension, though is normotensive despite holding norvasc, due to new right MCA stroke  Subjective: Taking po with supervision, no issues thus far. Has not had BM in past 24 hours, denies other bleeding.  Objective: Vitals:   05/07/22 0457 05/07/22 0838 05/07/22 1136 05/07/22 1527  BP: 124/65 137/67 121/61 130/64  Pulse: (!) 54 67 62 68  Resp: 15 19 19 18   Temp: 98.2 F (36.8 C) 98.8 F (37.1 C) 98.7 F (37.1 C) 98.5 F (36.9 C)  TempSrc: Oral Oral Oral Oral  SpO2: 99% 97% 97% 98%  Weight:      Height:      Gen: Frail elderly, pleasant female in no distress Pulm: Nonlabored breathing room air. Clear. CV: Regular rate and rhythm. No murmur, rub, or gallop. No JVD, no pitting dependent edema. GI: Abdomen soft, non-tender, non-distended, with normoactive bowel sounds.  Ext: Warm, no deformities Skin: No new rashes, lesions or ulcers on visualized skin. Neuro: Alert and oriented,  left facial droop and drool, +dysarthria, left spastic hemiparesis stable. Left palm guard. No new focal neurological deficits. Psych: Judgement and insight appear fair. Mood euthymic & affect congruent. Behavior is appropriate.    Data Personally reviewed: CBC: Recent Labs  Lab 05/04/22 1359 05/05/22 0328 05/06/22 0241 05/07/22 0622  WBC 11.3* 10.9* 11.4* 7.1  NEUTROABS 8.1*  --   --   --   HGB 10.9* 9.8* 9.1* 7.9*  HCT 33.7* 30.3* 27.9* 24.4*  MCV 90.3 88.9 90.0 89.4  PLT 250 231 245 244   Basic Metabolic Panel: Recent Labs  Lab 05/04/22 1359 05/04/22 1421 05/06/22 0241 05/07/22 0622  NA 141  --  143 140  K 3.9  --  2.7* 2.7*  CL 106  --  106 109  CO2 28  --  24 27  GLUCOSE 113*  --  80 80  BUN 8  --  5* <5*  CREATININE 0.81  --  0.72 0.63  CALCIUM 8.9  --  8.4* 7.9*  MG  --  1.9 1.6*  --    GFR: Estimated Creatinine Clearance: 44.1 mL/min (by C-G formula based on SCr of 0.63 mg/dL). Liver Function Tests: Recent Labs  Lab  05/04/22 1359  AST 29  ALT 13  ALKPHOS 107  BILITOT 0.6  PROT 7.7  ALBUMIN 2.4*   CBG: Recent Labs  Lab 05/05/22 1612  GLUCAP 76   Lipid Profile: Recent Labs    05/05/22 0328  CHOL 87  HDL 33*  LDLCALC 42  TRIG 59  CHOLHDL 2.6   Anemia Panel: Recent Labs    05/07/22 0622  VITAMINB12 812  FOLATE 8.6  FERRITIN 491*  TIBC 123*  IRON 80  RETICCTPCT 1.8   Urine analysis:    Component Value Date/Time   COLORURINE YELLOW 05/05/2022 0000   APPEARANCEUR CLEAR 05/05/2022 0000   LABSPEC 1.031 (H) 05/05/2022 0000   PHURINE 5.0 05/05/2022 0000   GLUCOSEU NEGATIVE 05/05/2022 0000   HGBUR SMALL (A) 05/05/2022 0000   BILIRUBINUR NEGATIVE 05/05/2022 0000   KETONESUR 5 (A) 05/05/2022 0000   PROTEINUR 30 (A) 05/05/2022 0000   UROBILINOGEN 1.0 10/22/2011 0926   NITRITE NEGATIVE 05/05/2022 0000   LEUKOCYTESUR NEGATIVE 05/05/2022 0000   Family Communication: None at bedside in AM or PM rounds.  Disposition: Status is:  Inpatient Remains inpatient appropriate because: On IV heparin for PE monitoring for GI bleeding with continued downtrending hgb Planned Discharge Destination: Skilled nursing facility      Tyrone Nine, MD 05/07/2022 4:08 PM Page by [email or web address]

## 2022-05-07 NOTE — Plan of Care (Signed)
  Problem: Education: Goal: Knowledge of General Education information will improve Description: Including pain rating scale, medication(s)/side effects and non-pharmacologic comfort measures Outcome: Progressing   Problem: Health Behavior/Discharge Planning: Goal: Ability to manage health-related needs will improve Outcome: Progressing   Problem: Clinical Measurements: Goal: Ability to maintain clinical measurements within normal limits will improve Outcome: Progressing Goal: Will remain free from infection Outcome: Progressing Goal: Diagnostic test results will improve Outcome: Progressing Goal: Respiratory complications will improve Outcome: Progressing Goal: Cardiovascular complication will be avoided Outcome: Progressing   Problem: Activity: Goal: Risk for activity intolerance will decrease Outcome: Progressing   Problem: Nutrition: Goal: Adequate nutrition will be maintained Outcome: Progressing   Problem: Coping: Goal: Level of anxiety will decrease Outcome: Progressing   Problem: Elimination: Goal: Will not experience complications related to bowel motility Outcome: Progressing Goal: Will not experience complications related to urinary retention Outcome: Progressing   Problem: Pain Managment: Goal: General experience of comfort will improve Outcome: Progressing   Problem: Safety: Goal: Ability to remain free from injury will improve Outcome: Progressing   Problem: Skin Integrity: Goal: Risk for impaired skin integrity will decrease Outcome: Progressing   Problem: Education: Goal: Knowledge of secondary prevention will improve (SELECT ALL) Outcome: Progressing Goal: Knowledge of patient specific risk factors will improve (INDIVIDUALIZE FOR PATIENT) Outcome: Progressing   Problem: Education: Goal: Knowledge of disease or condition will improve Outcome: Progressing Goal: Knowledge of secondary prevention will improve (SELECT ALL) Outcome:  Progressing Goal: Knowledge of patient specific risk factors will improve (INDIVIDUALIZE FOR PATIENT) Outcome: Progressing

## 2022-05-07 NOTE — Progress Notes (Signed)
ANTICOAGULATION CONSULT NOTE - Follow Up Consult  Pharmacy Consult for Heparin Indication: pulmonary embolus  No Known Allergies  Patient Measurements: Height: 5\' 1"  (154.9 cm) Weight: 66.7 kg (147 lb 0.8 oz) IBW/kg (Calculated) : 47.8 Heparin Dosing Weight: 61.8 kg  Vital Signs: Temp: 98.7 F (37.1 C) (08/02 1136) Temp Source: Oral (08/02 1136) BP: 121/61 (08/02 1136) Pulse Rate: 62 (08/02 1136)  Labs: Recent Labs    05/04/22 1359 05/04/22 1421 05/04/22 1949 05/05/22 0328 05/05/22 0701 05/06/22 0241 05/06/22 1212 05/07/22 0622  HGB 10.9*  --   --  9.8*  --  9.1*  --  7.9*  HCT 33.7*  --   --  30.3*  --  27.9*  --  24.4*  PLT 250  --   --  231  --  245  --  244  APTT 35  --   --   --   --   --   --   --   LABPROT 14.8  --   --   --   --   --   --   --   INR 1.2  --   --   --   --   --   --   --   HEPARINUNFRC  --   --   --   --    < > 0.51 0.45 0.35  CREATININE 0.81  --   --   --   --  0.72  --  0.63  TROPONINIHS  --  17 14  --   --   --   --   --    < > = values in this interval not displayed.    Estimated Creatinine Clearance: 44.1 mL/min (by C-G formula based on SCr of 0.63 mg/dL).  Assessment: 83 yof with a history of CAD, GERD, HTN, hypothyroidism, MI. Patient is presenting with weakness and dysphagia. Heparin per pharmacy consult placed for pulmonary embolus.   CT Head w/ new small ill-defined hypodensity at the right cerebral peduncle may reflect acute infarct.   CT Chest with large bilateral central PE extending into lobar branches no RHS on CT   Per Neurology, use PE goal heparin level (0.3-0.7).   Patient was not on anticoagulation prior to arrival.   8/3: Heparin level dropped to 0.29, subtherapeutic on 700 units/hr.  Hgb 9.1>7.9>8.0 , pltc stable.   No issues with Heparin infusion /IV line, no bleeding currently but has some prior reports of blood in stool per RN.  Goal of Therapy:  Heparin level 0.3-0.7 units/ml Monitor platelets by  anticoagulation protocol: Yes   Plan:  Increase heparin drip to 800 units/hr.  Check 6 hr HL (to ensure therapeutic in setting of PE, acute CVA, Hgb 80 and prior reports of blood in stool).  Daily heparin level and CBC. Monitor for signs/symptoms of bleeding.  10/3, RPh Clinical Pharmacist [phone] 05/07/2022,1:05 PM

## 2022-05-08 ENCOUNTER — Inpatient Hospital Stay (HOSPITAL_COMMUNITY): Payer: Medicare Other

## 2022-05-08 DIAGNOSIS — R531 Weakness: Secondary | ICD-10-CM

## 2022-05-08 DIAGNOSIS — D649 Anemia, unspecified: Secondary | ICD-10-CM | POA: Diagnosis not present

## 2022-05-08 DIAGNOSIS — I5032 Chronic diastolic (congestive) heart failure: Secondary | ICD-10-CM | POA: Diagnosis not present

## 2022-05-08 DIAGNOSIS — I82411 Acute embolism and thrombosis of right femoral vein: Secondary | ICD-10-CM

## 2022-05-08 DIAGNOSIS — R609 Edema, unspecified: Secondary | ICD-10-CM

## 2022-05-08 DIAGNOSIS — R1310 Dysphagia, unspecified: Secondary | ICD-10-CM | POA: Diagnosis not present

## 2022-05-08 DIAGNOSIS — I63511 Cerebral infarction due to unspecified occlusion or stenosis of right middle cerebral artery: Secondary | ICD-10-CM | POA: Diagnosis not present

## 2022-05-08 DIAGNOSIS — I2699 Other pulmonary embolism without acute cor pulmonale: Secondary | ICD-10-CM | POA: Diagnosis not present

## 2022-05-08 LAB — BASIC METABOLIC PANEL
Anion gap: 5 (ref 5–15)
BUN: <5 mg/dL — ABNORMAL LOW (ref 8–23)
CO2: 25 mmol/L (ref 22–32)
Calcium: 7.9 mg/dL — ABNORMAL LOW (ref 8.9–10.3)
Chloride: 108 mmol/L (ref 98–111)
Creatinine, Ser: 0.62 mg/dL (ref 0.44–1.00)
GFR, Estimated: >60 mL/min (ref >60)
Glucose, Bld: 84 mg/dL (ref 70–99)
Potassium: 3.7 mmol/L (ref 3.5–5.1)
Sodium: 138 mmol/L (ref 135–145)

## 2022-05-08 LAB — CBC
HCT: 24.4 % — ABNORMAL LOW (ref 36.0–46.0)
Hemoglobin: 8 g/dL — ABNORMAL LOW (ref 12.0–15.0)
MCH: 29.3 pg (ref 26.0–34.0)
MCHC: 32.8 g/dL (ref 30.0–36.0)
MCV: 89.4 fL (ref 80.0–100.0)
Platelets: 258 10*3/uL (ref 150–400)
RBC: 2.73 MIL/uL — ABNORMAL LOW (ref 3.87–5.11)
RDW: 14.6 % (ref 11.5–15.5)
WBC: 6 10*3/uL (ref 4.0–10.5)
nRBC: 0 % (ref 0.0–0.2)

## 2022-05-08 LAB — HEPARIN LEVEL (UNFRACTIONATED)
Heparin Unfractionated: 0.29 IU/mL — ABNORMAL LOW (ref 0.30–0.70)
Heparin Unfractionated: 0.4 IU/mL (ref 0.30–0.70)

## 2022-05-08 MED ORDER — SENNOSIDES-DOCUSATE SODIUM 8.6-50 MG PO TABS
1.0000 | ORAL_TABLET | Freq: Every day | ORAL | Status: DC
  Administered 2022-05-08 – 2022-05-12 (×5): 1 via ORAL
  Filled 2022-05-08 (×5): qty 1

## 2022-05-08 NOTE — Progress Notes (Signed)
Patient ID: [name], female   DOB: June 06, 1934, 88 y.o.   MRN: [id number]    Progress Note from the Palliative Medicine Team at [facility]   Patient Name: [name] [name]        Date: 05/08/2022 DOB: September 16, 1934  Age: 88 y.o. MRN#: [id number] Attending Physician: Tyrone Nine, MD Primary Care Physician: Oneal Grout, FNP Admit Date: 05/04/2022   Medical records reviewed, discussed with treatment team   88 y.o. female   admitted on 05/04/2022  with concerns of dysphagia. She was recently admitted to [facility] for hypokalemia but the morning after being discharged, she was found to be drooling and unable to take her pills or swallow her breakfast. MRI showed acute R MCA infarct involving the R insula nd overlying R frontal lobe.    CT Chest also revealed large PE. PMH includes: stroke with L-sided hemiparesis, h/o feeding tube (removed earlier this year), CABG, HTN, CHF, hypothyroidism   Admitted for treatment and stabilization.  Today is day 3 of this hospital stay, overall failure to thrive, main underlying issue is next step in transition of care.  Daughter has been mostly unavailable for conversation in plannning   Patient and her family face treatment option decisions, advanced directive decisions and anticipatory care needs.     This NP assessed patient at the bedside as a follow up for palliative medicine needs and emotional support.  Patient is progressing and close to readiness for dc, she remains high risk for decompensation.  She is appropriate for SNF however patient wants to return home.    Daughter verbalized her desire to take her mom home on initial consult but has been unavailable for further conversation.  Patient has medicare and likely eligible for in home aides and hospice.  We need to coordinate with caregiver.     Son/Earl at bedside.  Education offered on hospice benefit; philosophy and eligibility; and he tells me he will have his sister/Sara call  me today.  Patient herself is open to hospice at home but again needs input from her family.  Discussed all details with TOC and attending  Await callback from caregiver/Sara   Lorinda Creed NP  Palliative Medicine Team Team Phone # [phone] Pager [phone]

## 2022-05-08 NOTE — Progress Notes (Signed)
SLP Cancellation Note  Patient Details Name: CHASELYNN KEPPLE MRN: [id number] DOB: [birth year]   Cancelled treatment:       Reason Eval/Treat Not Completed: Patient at procedure or test/unavailable (having testing done in room). Will f/u as able.     Mahala Menghini., M.A. CCC-SLP Acute Rehabilitation Services Office [phone]  Secure chat preferred  05/08/2022, 10:56 AM

## 2022-05-08 NOTE — Progress Notes (Signed)
Progress Note  Patient: [name] [name] [id number] DOB: [birth year]  DOA: 05/04/2022  DOS: 05/08/2022    Brief hospital course: 88 y.o. female with medical history significant of stroke on aspirin and Plavix left-sided hemiparesis, CABG, hypertension, chronic diastolic heart failure, and hypothyroidism who presents with concerns of dysphagia.   Patient was recently hospitalized at [facility] overnight about 2 days ago.  Unfortunately unable to see records.  She presented there with a headache and reportedly was admitted for hypokalemia.  Family believes a CT head scan was done and her headache also resolved at discharge.  The night following discharge she was doing well and able to swallow her pills.  Then this morning when she woke up daughter noticed she was drooling all over when she attempted to take her pills.  Then again when she tried eating breakfast prompting daughter to take her to the ED.  Patient denies any headache or blurry vision.  No chest pain, shortness of breath.  No nausea, vomiting. Daughter also notes that about 3 weeks ago she was prescribed potassium supplementation and had persistent dark-colored diarrhea.  This resolved intermittently in terms of both diarrhea and her stool returning to brown color, but again about 6 days ago there was another episode of dark stool.  She is on aspirin and Plavix for her CVA   In the ED, she was afebrile and normotensive on room air. Had mild leukocytosis of 11.3, hemoglobin 10.9 which appears to be downward trending 12.8 back in 06/2021.  No other significant electrolyte abnormalities.   EKG my review sinus rhythm and slight ST depression in V4 to V6 which is also seen on prior EKGs.     CT head with new small ill-defined hypodensity at the right cerebral peduncle. MRI brain revealing for acute right MCA distribution infarct involving the right insula and overlying the right frontal lobe.   CT chest with large bilateral central  pulmonary artery emboli extending into the lobar branch with no evidence of right heart strain and evidence of right heart strain.  Trace left pleural effusion with developing infarct not excluded.   ED PA discussed with neurology Dr. Cheral Marker and after extensive discussion with him and with patient and her family.  They have decided to initiate IV heparin for a PE in the setting of new stroke.  Hospitalist then consult for admission.  Assessment and Plan: Acute right MCA stroke: acute right MCA infarct involving insula and right frontal lobe, atrophy and chronic microvascular ischemic disease - On IV heparin (PTA was DAPT) due to PE.  - Continue telemetry, though pt has indication for anticoagulation regardless.  - LDL is 42, continue rosuvastatin 40mg  daily.  - PT/OT evaluations recommending SNF. TOC and palliative care discussing with patient and family. We may, per their wishes, discharge home with maximal assistance. I believe this could include hospice services given her acute on subacute decline.   Possible acute blood loss anemia on anemia of chronic disease: Anemia panel consistent with chronic inflammation (TIBC low at 123, 65% saturation, ferritin of 491. Iron 80).   - Monitor H/H again in AM. Concern for cryptogenic bleeding. No BM recently though. Note BUN is <5 arguing against UGIB at this time  - Has no objection to transfusion, which was discussed with her and family.  - In setting of PE, will continue IV heparin for now. If FOBT is positive, would need work up.    Acute pulmonary embolism: CT chest with contrast (not CTA) with  large bilateral central pulmonary artery emboli extending into the lobar branch with no evidence of right heart strain and evidence of right heart strain. I have reviewed this personally and agree with radiologist interpretation. Also with acute right CFV DVT, likely provoked by bedbound status which is not likely to change.  - Continue IV heparin given risk of  bleeding, though hgb stable today. If stable x24 hours and no gross bleeding, would be able to transition to DOAC.    Constipation: Abd exam benign. This symptom argues against GI bleeding. - Senna  GERD: Chronic, stable - Continue PPI   Dysphagia:  - Dysphagia 1 and thin liquids, aspiration precautions per SLP. Hoping to avoid need for PEG. Palliative consultation appreciated, d/w Lorinda Creed, NP again today.  Hypokalemia: Normalized with supplementation and restart of diet.   Hypothyroidism - Continue synthroid   Slurred speech due to CVA:  - SLP evaluation appreciated.      Chronic HFpEF, CAD: Stable. 3-vessel calcification noted on CT, followed by [facility]. No recent angina or CHF symptoms.   Essential hypertension: - Allowing for permissive hypertension, though is normotensive despite holding norvasc, due to new right MCA stroke  Subjective: No bleeding or stool overnight, LBM 7/31.   Objective: Vitals:   05/08/22 0340 05/08/22 0813 05/08/22 1140 05/08/22 1230  BP: 116/67 122/75 133/69 (!) 156/76  Pulse: (!) 57 62 62 70  Resp: 17 19 18 19   Temp: 98 F (36.7 C) 98.8 F (37.1 C) 98.3 F (36.8 C) 98.7 F (37.1 C)  TempSrc: Oral Oral Oral Oral  SpO2: 98% 96% 98% 100%  Weight:      Height:      Gen: Sweet, elderly female in no distress Pulm: Nonlabored breathing room air. Clear. CV: Regular rate and rhythm. No murmur, rub, or gallop. No JVD, no significant dependent edema. GI: Abdomen soft, non-tender, non-distended, with normoactive bowel sounds.  Ext: Warm, no deformities Skin: No rashes, lesions or ulcers on visualized skin. Neuro: Alert and oriented, dysarthria, facial droop, and hemiparesis is stable. No new focal neurological deficits. Psych: Judgement and insight appear fair. Mood euthymic & affect congruent. Behavior is appropriate.    Data Personally reviewed: CBC: Recent Labs  Lab 05/04/22 1359 05/05/22 0328 05/06/22 0241 05/07/22 0622  05/08/22 0555  WBC 11.3* 10.9* 11.4* 7.1 6.0  NEUTROABS 8.1*  --   --   --   --   HGB 10.9* 9.8* 9.1* 7.9* 8.0*  HCT 33.7* 30.3* 27.9* 24.4* 24.4*  MCV 90.3 88.9 90.0 89.4 89.4  PLT 250 231 245 244 258   Basic Metabolic Panel: Recent Labs  Lab 05/04/22 1359 05/04/22 1421 05/06/22 0241 05/07/22 0622 05/07/22 1524 05/08/22 0555  NA 141  --  143 140 140 138  K 3.9  --  2.7* 2.7* 3.1* 3.7  CL 106  --  106 109 105 108  CO2 28  --  24 27 25 25   GLUCOSE 113*  --  80 80 121* 84  BUN 8  --  5* <5* <5* <5*  CREATININE 0.81  --  0.72 0.63 0.70 0.62  CALCIUM 8.9  --  8.4* 7.9* 8.0* 7.9*  MG  --  1.9 1.6*  --  2.4  --    GFR: Estimated Creatinine Clearance: 44.1 mL/min (by C-G formula based on SCr of 0.62 mg/dL). Liver Function Tests: Recent Labs  Lab 05/04/22 1359  AST 29  ALT 13  ALKPHOS 107  BILITOT 0.6  PROT 7.7  ALBUMIN 2.4*   CBG: Recent Labs  Lab 05/05/22 1612  GLUCAP 76   Anemia Panel: Recent Labs    05/07/22 0622  VITAMINB12 812  FOLATE 8.6  FERRITIN 491*  TIBC 123*  IRON 80  RETICCTPCT 1.8   Urine analysis:    Component Value Date/Time   COLORURINE YELLOW 05/05/2022 0000   APPEARANCEUR CLEAR 05/05/2022 0000   LABSPEC 1.031 (H) 05/05/2022 0000   PHURINE 5.0 05/05/2022 0000   GLUCOSEU NEGATIVE 05/05/2022 0000   HGBUR SMALL (A) 05/05/2022 0000   BILIRUBINUR NEGATIVE 05/05/2022 0000   KETONESUR 5 (A) 05/05/2022 0000   PROTEINUR 30 (A) 05/05/2022 0000   UROBILINOGEN 1.0 10/22/2011 0926   NITRITE NEGATIVE 05/05/2022 0000   LEUKOCYTESUR NEGATIVE 05/05/2022 0000   Family Communication: Son at bedside this PM.  Disposition: Status is: Inpatient Remains inpatient appropriate because: On IV heparin for PE/DVT, monitoring for GI bleeding with continued downtrending hgb. If hgb stable and no gross bleeding noted 8/4, could discharge.  Planned Discharge Destination: Skilled nursing facility recommended, though patient/family leaning more toward maximal  assistance at home. Palliative also discussing home hospice with them.   Tyrone Nine, MD 05/08/2022 12:32 PM Page by [email or web address]

## 2022-05-08 NOTE — Progress Notes (Signed)
Left UE venous duplex study completed. Please see CV Proc for preliminary results.  Briceyda Abdullah BS, RVT 05/08/2022 11:40 AM

## 2022-05-08 NOTE — Care Management Important Message (Signed)
Important Message  Patient Details  Name: [name] [name] MRN: [id number] Date of Birth: 1934-06-11   Medicare Important Message Given:  Yes     [name] 05/08/2022, 2:16 PM

## 2022-05-08 NOTE — Progress Notes (Signed)
Occupational Therapy Treatment Patient Details Name: [name] [name] MRN: [id number] DOB: Sep 13, 1934 Today's Date: 05/08/2022   History of present illness [name] [name] is a 88 y.o. female presenting with dysphagia. MRI postive for acute R MCA infarct, CT for PE. Recent hospital admission in [facility] 7/29. PMH: stroke with left-sided spastic hemiparesis, CABG, hypertension, chronic diastolic heart failure, and hypothyroidism   OT comments  Pt in bed upon therapy arrival and agreeable to participate in OT session. Pt with palm guard donned while in bed. When asked, pt reports that she has been wearing it all day and it has not been taken off. Therapist provided education on recommended wearing schedule (2hrs/on 2hrs/off). Pt verbalized understanding. Therapist assisted patient with removal of palm guard. Pt's skin on volar aspect of hand presenting with very slight maceration which is more than likely due to her palm sweating. Therapist provided hygiene to hand using soap and water. Hand was thoroughly dried and placed on pillow. Communication provided to nursing and Son on recent removal and when to replace palm guard on left hand. Understanding of information was verbalized.    Recommendations for follow up therapy are one component of a multi-disciplinary discharge planning process, led by the attending physician.  Recommendations may be updated based on patient status, additional functional criteria and insurance authorization.    Follow Up Recommendations  Skilled nursing-short term rehab (<3 hours/day)    Assistance Recommended at Discharge Frequent or constant Supervision/Assistance  Patient can return home with the following  A lot of help with walking and/or transfers;A lot of help with bathing/dressing/bathroom;Assistance with feeding;Direct supervision/assist for medications management;Direct supervision/assist for financial management;Assist for transportation;Help with stairs or ramp for  entrance;Assistance with cooking/housework         Precautions / Restrictions Precautions Precautions: Fall Precaution Comments: L hemiparesis Restrictions Weight Bearing Restrictions: No        ADL either performed or assessed with clinical judgement   ADL       Grooming: Wash/dry hands;Bed level;Total assistance Grooming Details (indicate cue type and reason): OT provided hygiene to left hand and fingers after removing palmer guard.          Cognition Arousal/Alertness: Awake/alert Behavior During Therapy: WFL for tasks assessed/performed Overall Cognitive Status: History of cognitive impairments - at baseline            Exercises Other Exercises Other Exercises: Removed palmer guard to assess skin integrity and provided education to family (Son) and nurse regarding removal time and when to apply to left hand again (2:15PM).            Pertinent Vitals/ Pain       Pain Assessment Pain Assessment: No/denies pain         Frequency  Min 2X/week        Progress Toward Goals  OT Goals(current goals can now be found in the care plan section)  Progress towards OT goals: Progressing toward goals     Plan Discharge plan remains appropriate;Frequency remains appropriate       AM-PAC OT "6 Clicks" Daily Activity     Outcome Measure   Help from another person eating meals?: A Lot Help from another person taking care of personal grooming?: Total Help from another person toileting, which includes using toliet, bedpan, or urinal?: Total Help from another person bathing (including washing, rinsing, drying)?: A Lot Help from another person to put on and taking off regular upper body clothing?: A Lot Help from another person to  put on and taking off regular lower body clothing?: Total 6 Click Score: 9    End of Session    OT Visit Diagnosis: Other abnormalities of gait and mobility (R26.89);Unsteadiness on feet (R26.81);Muscle weakness (generalized)  (M62.81);Other symptoms and signs involving cognitive function;Other symptoms and signs involving the nervous system (R29.898)   Activity Tolerance Patient tolerated treatment well   Patient Left in bed;with call bell/phone within reach;with bed alarm set;with family/visitor present;with nursing/sitter in room   Nurse Communication Other (comment) (Removal of palm guard (left hand) and what time to donn (2:15PM))        Time: 1212-1230 OT Time Calculation (min): 18 min  Charges: OT General Charges $OT Visit: 1 Visit OT Treatments $Therapeutic Activity: 8-22 mins  Limmie Patricia, OTR/L,CBIS  Supplemental OT - [facility] and [facility]   Tyquarius Paglia, Charisse March 05/08/2022, 3:33 PM

## 2022-05-08 NOTE — Progress Notes (Signed)
  Transition of Care [facility]) Screening Note   Patient Details  Name: [name] [name] Date of Birth: Feb 05, 1934   Transition of Care [facility]) CM/SW Contact:    Baldemar Lenis, LCSW Phone Number: 05/08/2022, 3:51 PM    Transition of Care Department [facility]) has reviewed patient, palliative consult is pending at this time, and patient not medically stable for discharge yet. From home with family, recommendation for SNF but family hopeful to take patient home instead. We will continue to monitor patient advancement through interdisciplinary progression rounds. If new patient transition needs arise, please place a TOC consult.

## 2022-05-08 NOTE — Progress Notes (Signed)
ANTICOAGULATION CONSULT NOTE - Follow Up Consult  Pharmacy Consult for Heparin Indication: pulmonary embolus  No Known Allergies  Patient Measurements: Height: 5\' 1"  (154.9 cm) Weight: 66.7 kg (147 lb 0.8 oz) IBW/kg (Calculated) : 47.8 Heparin Dosing Weight: 61.8 kg  Vital Signs: Temp: 98.1 F (36.7 C) (08/03 1934) Temp Source: Oral (08/03 1934) BP: 133/62 (08/03 1934) Pulse Rate: 65 (08/03 1934)  Labs: Recent Labs    05/06/22 0241 05/06/22 1212 05/07/22 0622 05/07/22 1524 05/08/22 0555 05/08/22 1959  HGB 9.1*  --  7.9*  --  8.0*  --   HCT 27.9*  --  24.4*  --  24.4*  --   PLT 245  --  244  --  258  --   HEPARINUNFRC 0.51   < > 0.35  --  0.29* 0.40  CREATININE 0.72  --  0.63 0.70 0.62  --    < > = values in this interval not displayed.     Estimated Creatinine Clearance: 44.1 mL/min (by C-G formula based on SCr of 0.62 mg/dL).  Assessment: 67 yof with a history of CAD, GERD, HTN, hypothyroidism, MI. Patient is presenting with weakness and dysphagia. Heparin per pharmacy consult placed for pulmonary embolus.   CT Head w/ new small ill-defined hypodensity at the right cerebral peduncle may reflect acute infarct.   CT Chest with large bilateral central PE extending into lobar branches no RHS on CT   Per Neurology, use PE goal heparin level (0.3-0.7).   Patient was not on anticoagulation prior to arrival.   PM update 8/3: HL 0.4 (therapeutic). No recent overt bleeding noted.   Goal of Therapy:  Heparin level 0.3-0.7 units/ml Monitor platelets by anticoagulation protocol: Yes   Plan:  Continue heparin drip at 800 units/hr.  Check HL with AM labs Daily heparin level and CBC. Monitor for signs/symptoms of bleeding.  Rhett Mutschler A. 89, PharmD, BCPS, FNKF Clinical Pharmacist [place] Please utilize Amion for appropriate phone number to reach the unit pharmacist [facility])  05/08/2022,8:43 PM

## 2022-05-08 NOTE — Progress Notes (Signed)
Physical Therapy Treatment Patient Details Name: [name] [name] MRN: [id number] DOB: [birth year] Today's Date: 05/08/2022   History of Present Illness [name] [name] is a 88 y.o. female presenting with dysphagia. MRI postive for acute R MCA infarct, CT for PE. Recent hospital admission in [facility] 7/29. PMH: stroke with left-sided spastic hemiparesis, CABG, hypertension, chronic diastolic heart failure, and hypothyroidism    PT Comments    Pt progressing towards physical therapy goals. Pleasant and motivated to work with PT at this time. Session focused on therapeutic exercise, ROM, and functional bed mobility. Session limited by bowel movement and required full linen change. Will continue to follow and progress as able per POC.     Recommendations for follow up therapy are one component of a multi-disciplinary discharge planning process, led by the attending physician.  Recommendations may be updated based on patient status, additional functional criteria and insurance authorization.  Follow Up Recommendations  Skilled nursing-short term rehab (<3 hours/day) Can patient physically be transported by private vehicle: No   Assistance Recommended at Discharge Frequent or constant Supervision/Assistance  Patient can return home with the following A lot of help with walking and/or transfers;A lot of help with bathing/dressing/bathroom;Direct supervision/assist for medications management;Direct supervision/assist for financial management;Assist for transportation;Help with stairs or ramp for entrance;Assistance with feeding;Assistance with cooking/housework   Equipment Recommendations   (hoyer lift)    Recommendations for Other Services       Precautions / Restrictions Precautions Precautions: Fall Precaution Comments: L hemiparesis baseline Restrictions Weight Bearing Restrictions: No     Mobility  Bed Mobility Overal bed mobility: Needs Assistance Bed Mobility: Rolling, Sit to  Supine Rolling: Mod assist, Min assist Sidelying to sit: Max assist   Sit to supine: Max assist   General bed mobility comments: Able to utilize RUE to roll L fairly well. Increased assist required when rolling R. Assist for elevation of trunk to full sitting position as well as for return to supine/positioning in bed.    Transfers                   General transfer comment: Unable to progress OOB without +2 assist.    Ambulation/Gait               General Gait Details: pt non-ambulatory at baseline   Stairs             Wheelchair Mobility    Modified Rankin (Stroke Patients Only) Modified Rankin (Stroke Patients Only) Pre-Morbid Rankin Score: Severe disability Modified Rankin: Severe disability     Balance Overall balance assessment: Needs assistance Sitting-balance support: Feet supported, Single extremity supported Sitting balance-Leahy Scale: Poor Sitting balance - Comments: pt able to maintain static sitting balance with R UE <15 sec prior to R lateral lean   Standing balance support: During functional activity Standing balance-Leahy Scale: Zero Standing balance comment: dependent on external support                            Cognition Arousal/Alertness: Awake/alert Behavior During Therapy: WFL for tasks assessed/performed Overall Cognitive Status: History of cognitive impairments - at baseline                                 General Comments: per son pt with memory deficits from previous cva.        Exercises General Exercises - Upper Extremity Shoulder  Flexion: 10 reps, Left Elbow Flexion: 10 reps, Left Elbow Extension: 10 reps, Left Wrist Flexion: 10 reps, Left Wrist Extension: 10 reps, Left General Exercises - Lower Extremity Short Arc Quad: 10 reps, AAROM, Left Long Arc Quad: 10 reps, AROM, Right Heel Slides: 10 reps, AAROM, Left Hip ABduction/ADduction: 10 reps, Both (isometric pillow  squeeze) Other Exercises Other Exercises: L ankle DF stretch 3x30" Other Exercises: L side shoulder IR/ER elbow at 90 AAROM    General Comments        Pertinent Vitals/Pain Pain Assessment Pain Assessment: No/denies pain    Home Living                          Prior Function            PT Goals (current goals can now be found in the care plan section) Acute Rehab PT Goals Patient Stated Goal: unable to state PT Goal Formulation: With patient Time For Goal Achievement: 05/20/22 Potential to Achieve Goals: Fair Progress towards PT goals: Progressing toward goals    Frequency    Min 3X/week      PT Plan Current plan remains appropriate    Co-evaluation              AM-PAC PT "6 Clicks" Mobility   Outcome Measure  Help needed turning from your back to your side while in a flat bed without using bedrails?: A Lot Help needed moving from lying on your back to sitting on the side of a flat bed without using bedrails?: A Lot Help needed moving to and from a bed to a chair (including a wheelchair)?: Total Help needed standing up from a chair using your arms (e.g., wheelchair or bedside chair)?: Total Help needed to walk in hospital room?: Total Help needed climbing 3-5 steps with a railing? : Total 6 Click Score: 8    End of Session Equipment Utilized During Treatment: Gait belt Activity Tolerance: Patient tolerated treatment well Patient left: in chair;with call bell/phone within reach;with chair alarm set;with family/visitor present Nurse Communication: Mobility status PT Visit Diagnosis: Muscle weakness (generalized) (M62.81)     Time: 7829-5621 PT Time Calculation (min) (ACUTE ONLY): 42 min  Charges:  $Therapeutic Exercise: 23-37 mins $Therapeutic Activity: 8-22 mins                     Conni Slipper, PT, DPT Acute Rehabilitation Services Secure Chat Preferred Office: [phone]    [name] 05/08/2022, 3:44 PM

## 2022-05-08 NOTE — Progress Notes (Signed)
Bilateral LE venous duplex study completed. RN notified of preliminary results. Please see CV Proc for preliminary results.  Sulayman Manning BS, RVT 05/08/2022 11:38 AM

## 2022-05-09 ENCOUNTER — Other Ambulatory Visit (HOSPITAL_COMMUNITY): Payer: Self-pay

## 2022-05-09 DIAGNOSIS — I639 Cerebral infarction, unspecified: Secondary | ICD-10-CM

## 2022-05-09 DIAGNOSIS — I2699 Other pulmonary embolism without acute cor pulmonale: Secondary | ICD-10-CM | POA: Diagnosis not present

## 2022-05-09 DIAGNOSIS — D649 Anemia, unspecified: Secondary | ICD-10-CM | POA: Diagnosis not present

## 2022-05-09 DIAGNOSIS — I63511 Cerebral infarction due to unspecified occlusion or stenosis of right middle cerebral artery: Secondary | ICD-10-CM | POA: Diagnosis not present

## 2022-05-09 LAB — HEPARIN LEVEL (UNFRACTIONATED): Heparin Unfractionated: 0.46 IU/mL (ref 0.30–0.70)

## 2022-05-09 LAB — CBC
HCT: 25.8 % — ABNORMAL LOW (ref 36.0–46.0)
Hemoglobin: 8.5 g/dL — ABNORMAL LOW (ref 12.0–15.0)
MCH: 29.3 pg (ref 26.0–34.0)
MCHC: 32.9 g/dL (ref 30.0–36.0)
MCV: 89 fL (ref 80.0–100.0)
Platelets: 291 10*3/uL (ref 150–400)
RBC: 2.9 MIL/uL — ABNORMAL LOW (ref 3.87–5.11)
RDW: 14.7 % (ref 11.5–15.5)
WBC: 6.1 10*3/uL (ref 4.0–10.5)
nRBC: 0 % (ref 0.0–0.2)

## 2022-05-09 MED ORDER — APIXABAN (ELIQUIS) EDUCATION KIT FOR DVT/PE PATIENTS
PACK | Freq: Once | Status: AC
  Filled 2022-05-09: qty 1

## 2022-05-09 MED ORDER — APIXABAN 5 MG PO TABS
10.0000 mg | ORAL_TABLET | Freq: Two times a day (BID) | ORAL | Status: DC
  Administered 2022-05-09 – 2022-05-12 (×7): 10 mg via ORAL
  Filled 2022-05-09 (×7): qty 2

## 2022-05-09 MED ORDER — APIXABAN 5 MG PO TABS: 5.0000 mg | ORAL_TABLET | Freq: Two times a day (BID) | ORAL | Status: DC

## 2022-05-09 NOTE — Progress Notes (Signed)
[facility] [address]) Hospital Liaison note:  This is a pending outpatient-based Palliative Care patient. Will continue to follow for disposition.  Please call with any outpatient palliative questions or concerns.  Thank you, Abran Cantor, LPN [facility] Liaison [phone]

## 2022-05-09 NOTE — TOC Benefit Eligibility Note (Signed)
Patient Scientific laboratory technician completed.     The patient is currently admitted and upon discharge could be taking ELIQUIS 5MG .   The current 30 day co-pay is, $0.   The patient is insured through [employer].

## 2022-05-09 NOTE — Progress Notes (Signed)
Progress Note  Patient: [name] [name] QMG:[id number] DOB: 02-16-1934  DOA: 05/04/2022  DOS: 05/09/2022    Brief hospital course: 88 y.o. female with medical history significant of stroke on aspirin and Plavix left-sided hemiparesis, CABG, hypertension, chronic diastolic heart failure, and hypothyroidism who presents with concerns of dysphagia.   Patient was recently hospitalized at [facility] overnight about 2 days ago.  Unfortunately unable to see records.  She presented there with a headache and reportedly was admitted for hypokalemia.  Family believes a CT head scan was done and her headache also resolved at discharge.  The night following discharge she was doing well and able to swallow her pills.  Then this morning when she woke up daughter noticed she was drooling all over when she attempted to take her pills.  Then again when she tried eating breakfast prompting daughter to take her to the ED.  Patient denies any headache or blurry vision.  No chest pain, shortness of breath.  No nausea, vomiting. Daughter also notes that about 3 weeks ago she was prescribed potassium supplementation and had persistent dark-colored diarrhea.  This resolved intermittently in terms of both diarrhea and her stool returning to brown color, but again about 6 days ago there was another episode of dark stool.  She is on aspirin and Plavix for her CVA   In the ED, she was afebrile and normotensive on room air. Had mild leukocytosis of 11.3, hemoglobin 10.9 which appears to be downward trending 12.8 back in 06/2021.  No other significant electrolyte abnormalities.   EKG my review sinus rhythm and slight ST depression in V4 to V6 which is also seen on prior EKGs.     CT head with new small ill-defined hypodensity at the right cerebral peduncle. MRI brain revealing for acute right MCA distribution infarct involving the right insula and overlying the right frontal lobe.   CT chest with large bilateral central  pulmonary artery emboli extending into the lobar branch with no evidence of right heart strain and evidence of right heart strain.  Trace left pleural effusion with developing infarct not excluded.   ED PA discussed with neurology Dr. Otelia Limes and after extensive discussion with him and with patient and her family.  They have decided to initiate IV heparin for a PE in the setting of new stroke.  Hospitalist then consult for admission.  Assessment and Plan: Acute right MCA stroke: acute right MCA infarct involving insula and right frontal lobe, atrophy and chronic microvascular ischemic disease - On eliquis (PTA was DAPT) due to PE/DVT.  - Continue telemetry, though pt has indication for anticoagulation regardless.  - LDL is 42, continue rosuvastatin 40mg  daily.  - PT/OT evaluations recommending SNF.     Possible acute blood loss anemia on anemia of chronic disease: Anemia panel consistent with chronic inflammation (TIBC low at 123, 65% saturation, ferritin of 491. Iron 80).   - H/H is stable x48 hours with no bleeding clinically. BUN low, not consistent with UGIB. Will transition heparin to DOAC 8/4. - Has no objection to transfusion, which was discussed with her and family.    Acute pulmonary embolism, acute right CFV DVT: CT chest with contrast (not CTA) with large bilateral central pulmonary artery emboli extending into the lobar branch with no evidence of right heart strain and evidence of right heart strain. I have reviewed this personally and agree with radiologist interpretation. Also with acute right CFV DVT, likely provoked by bedbound status which is not likely to  change.  - Continue anticoagulation, planning long term eliquis.   Constipation: Abd exam benign. This symptom argues against GI bleeding. - Senna ordered  GERD: Chronic, stable - Continue PPI   Dysphagia:  - Dysphagia 1 and thin liquids, aspiration precautions per SLP. Hoping to avoid need for PEG. Palliative consultation  appreciated, they should follow at SNF.  - D/w RN, will need consistent encouragement for oral intake.  Hypokalemia: Normalized with supplementation and restart of diet.   Hypothyroidism - Continue synthroid   Slurred speech due to CVA:  - SLP evaluation appreciated.      Chronic HFpEF, CAD: Stable. 3-vessel calcification noted on CT, followed by [facility]. No recent angina or CHF symptoms.   Essential hypertension: - Allowing for permissive hypertension, though is normotensive despite holding norvasc, due to new right MCA stroke  Subjective: No bleeding, feeling well. Wants to go to SNF.  Objective: Vitals:   05/08/22 2305 05/09/22 0407 05/09/22 0722 05/09/22 1237  BP: 128/62 125/62 122/70 (!) 97/54  Pulse: 61 64 63 71  Resp: 19 17 16 19   Temp: 97.7 F (36.5 C) 98.1 F (36.7 C) 98.6 F (37 C) 98.7 F (37.1 C)  TempSrc: Oral Oral Oral Oral  SpO2: 98% 97% 100% 98%  Weight:      Height:      Gen: Pleasant elderly female in no distress Pulm: Nonlabored breathing room air. Clear. CV: Regular rate and rhythm. No murmur, rub, or gallop. No JVD, no dependent edema. GI: Abdomen soft, non-tender, non-distended, with normoactive bowel sounds.  Ext: Warm, no deformities Skin: No rashes, lesions or ulcers on visualized skin. Neuro: Alert and oriented. Stable dysarthria and left hemiparesis without new focal neurological deficits. Psych: Judgement and insight appear fair. Mood euthymic & affect congruent. Behavior is appropriate.    Data Personally reviewed: CBC: Recent Labs  Lab 05/04/22 1359 05/05/22 0328 05/06/22 0241 05/07/22 0622 05/08/22 0555 05/09/22 0533  WBC 11.3* 10.9* 11.4* 7.1 6.0 6.1  NEUTROABS 8.1*  --   --   --   --   --   HGB 10.9* 9.8* 9.1* 7.9* 8.0* 8.5*  HCT 33.7* 30.3* 27.9* 24.4* 24.4* 25.8*  MCV 90.3 88.9 90.0 89.4 89.4 89.0  PLT 250 231 245 244 258 291   Basic Metabolic Panel: Recent Labs  Lab 05/04/22 1359 05/04/22 1421 05/06/22 0241  05/07/22 0622 05/07/22 1524 05/08/22 0555  NA 141  --  143 140 140 138  K 3.9  --  2.7* 2.7* 3.1* 3.7  CL 106  --  106 109 105 108  CO2 28  --  24 27 25 25   GLUCOSE 113*  --  80 80 121* 84  BUN 8  --  5* <5* <5* <5*  CREATININE 0.81  --  0.72 0.63 0.70 0.62  CALCIUM 8.9  --  8.4* 7.9* 8.0* 7.9*  MG  --  1.9 1.6*  --  2.4  --    GFR: Estimated Creatinine Clearance: 44.1 mL/min (by C-G formula based on SCr of 0.62 mg/dL). Liver Function Tests: Recent Labs  Lab 05/04/22 1359  AST 29  ALT 13  ALKPHOS 107  BILITOT 0.6  PROT 7.7  ALBUMIN 2.4*   CBG: Recent Labs  Lab 05/05/22 1612  GLUCAP 76   Anemia Panel: Recent Labs    05/07/22 0622  VITAMINB12 812  FOLATE 8.6  FERRITIN 491*  TIBC 123*  IRON 80  RETICCTPCT 1.8   Urine analysis:    Component Value Date/Time  COLORURINE YELLOW 05/05/2022 0000   APPEARANCEUR CLEAR 05/05/2022 0000   LABSPEC 1.031 (H) 05/05/2022 0000   PHURINE 5.0 05/05/2022 0000   GLUCOSEU NEGATIVE 05/05/2022 0000   HGBUR SMALL (A) 05/05/2022 0000   BILIRUBINUR NEGATIVE 05/05/2022 0000   KETONESUR 5 (A) 05/05/2022 0000   PROTEINUR 30 (A) 05/05/2022 0000   UROBILINOGEN 1.0 10/22/2011 0926   NITRITE NEGATIVE 05/05/2022 0000   LEUKOCYTESUR NEGATIVE 05/05/2022 0000   Family Communication: Son at bedside this PM.  Disposition: Status is: Inpatient Remains inpatient appropriate because: Unsafe discharge, seeking SNF placement. Planned Discharge Destination: Skilled nursing facility recommended, pt and family amenable.   Tyrone Nine, MD 05/09/2022 2:19 PM Page by [email or web address]

## 2022-05-09 NOTE — Progress Notes (Signed)
Speech Language Pathology Treatment: Dysphagia  Patient Details Name: [name] [name] MRN: [id number] DOB: 12-03-1933 Today's Date: 05/09/2022 Time: 0949-1000 SLP Time Calculation (min) (ACUTE ONLY): 11 min  Assessment / Plan / Recommendation Clinical Impression  Pt believes that she has been doing better on her full liquid diet, also reporting less coughing during meals. SLP provided trials of thin liquids as well as advanced trials of purees for practice with oral control and clearance. She still has a significant amount of L anterior loss of purees with visibly incomplete labial closure, although L pocketing might be a little less in volume. Pt needs consistent cues to perform a second swallow but there is still coughing noted in between trials of puree. She has difficulty managing L oral deficits despite cues. She prefers to stay on full liquid diet for now.   HPI HPI: Pt is an 88 yo female presenting with concerns of dysphagia. She was recently admitted to [facility] for hypokalemia but the morning after being discharged, she was found to be drooling and unable to take her pills or swallow her breakfast. MRI showed acute R MCA infarct involving the R insula nd overlying R frontal lobe. CT Chest also revealed large PE. PMH includes: stroke with L-sided hemiparesis, h/o feeding tube (removed earlier this year), CABG, HTN, CHF, hypothyroidism      SLP Plan  Continue with current plan of care      Recommendations for follow up therapy are one component of a multi-disciplinary discharge planning process, led by the attending physician.  Recommendations may be updated based on patient status, additional functional criteria and insurance authorization.    Recommendations  Diet recommendations: Thin liquid Liquids provided via: Straw Medication Administration: Crushed with puree Supervision: Staff to assist with self feeding Compensations: Minimize environmental distractions;Slow  rate;Small sips/bites;Multiple dry swallows after each bite/sip Postural Changes and/or Swallow Maneuvers: Seated upright 90 degrees                Oral Care Recommendations: Oral care QID Follow Up Recommendations: Skilled nursing-short term rehab (<3 hours/day) Assistance recommended at discharge: Frequent or constant Supervision/Assistance SLP Visit Diagnosis: Dysphagia, unspecified (R13.10) Plan: Continue with current plan of care           Mahala Menghini., M.A. CCC-SLP Acute Rehabilitation Services Office [phone]  Secure chat preferred   05/09/2022, 10:37 AM

## 2022-05-09 NOTE — TOC Progression Note (Signed)
Transition of Care [facility]) - Progression Note    Patient Details  Name: ALDINA PORTA MRN: [id number] Date of Birth: 15-Mar-1934  Transition of Care [facility]) CM/SW Contact  Kermit Balo, RN Phone Number: 05/09/2022, 11:42 AM  Clinical Narrative:    Pt and family have decide to have patient attend SNF rehab prior to home. CSW has faxed out and will provide offers to the patient and family when available.  TOC following.   Expected Discharge Plan: Skilled Nursing Facility Barriers to Discharge: Continued Medical Work up  Expected Discharge Plan and Services Expected Discharge Plan: Skilled Nursing Facility In-house Referral: Clinical Social Work   Post Acute Care Choice: Skilled Nursing Facility                                         Social Determinants of Health (SDOH) Interventions    Readmission Risk Interventions     No data to display

## 2022-05-09 NOTE — TOC Progression Note (Signed)
Transition of Care [facility]) - Progression Note    Patient Details  Name: KAMEO BAINS MRN: [id number] Date of Birth: Apr 08, 1934  Transition of Care [facility]) CM/SW [place], [place] Phone Number: 05/09/2022, 3:14 PM  Clinical Narrative:   CSW met with patient with son and daughter at bedside to provide bed offers. Daughter leaning towards [facility] as that would be closer for her to go and visit, but requested time to discuss. CSW went back to check on SNF choice and family had left. CSW sent text message to Clarise Cruz to check on SNF choice, awaiting response. Once SNF has been chosen, CSW to request insurance authorization.    Expected Discharge Plan: [place] Barriers to Discharge: Continued Medical Work up  Expected Discharge Plan and Services Expected Discharge Plan: [place] In-house Referral: Clinical Social Work   Post Acute Care Choice: [place]                                         Social Determinants of Health (SDOH) Interventions    Readmission Risk Interventions     No data to display

## 2022-05-09 NOTE — Progress Notes (Signed)
ANTICOAGULATION CONSULT NOTE - Follow Up Consult  Pharmacy Consult for Heparin > Transtion to Eliquis Indication: pulmonary embolus/ RLE DVT  No Known Allergies  Patient Measurements: Height: 5\' 1"  (154.9 cm) Weight: 66.7 kg (147 lb 0.8 oz) IBW/kg (Calculated) : 47.8 Heparin Dosing Weight: 61.8 kg  Vital Signs: Temp: 98.6 F (37 C) (08/04 0722) Temp Source: Oral (08/04 0722) BP: 122/70 (08/04 0722) Pulse Rate: 63 (08/04 0722)  Labs: Recent Labs    05/07/22 0622 05/07/22 1524 05/08/22 0555 05/08/22 1959 05/09/22 0533  HGB 7.9*  --  8.0*  --  8.5*  HCT 24.4*  --  24.4*  --  25.8*  PLT 244  --  258  --  291  HEPARINUNFRC 0.35  --  0.29* 0.40 0.46  CREATININE 0.63 0.70 0.62  --   --      Estimated Creatinine Clearance: 44.1 mL/min (by C-G formula based on SCr of 0.62 mg/dL).  Assessment: 27 yof with a history of CAD, GERD, HTN, hypothyroidism, MI. Patient is presenting with weakness and dysphagia. Heparin per pharmacy consult placed for pulmonary embolus.  Patient was not on anticoagulation prior to arrival.    CT Head w/ new small ill-defined hypodensity at the right cerebral peduncle may reflect acute infarct.   CT Chest with large bilateral central PE extending into lobar branches no RHS on CT   Per Neurology, use PE goal heparin level (0.3-0.7) for IV heparin infusion.    8/4:   pharmacy consulted to transition from IV heparin to oral Eliquis for PE and DVT.   05/08/22 bilateral  lower extremity venous duplex:  + Right DVT 05/08/22 bilateral  upper extremity venous duplex: negative   Goal of Therapy:  Monitor platelets by anticoagulation protocol: Yes   Plan:  Discontinue IV  heparin drip now  and give Eliquis 10mg  bid x 7 days then reduce to 5 mg BID  Monitor for signs/symptoms of bleeding. Pharmacist will educate patient about Eliquis prior to discharge.  Copay check for Eliquis cost is in process.    Thank you for allowing pharmacy to be part of this  patients care team.  07/08/22, RPh Clinical Pharmacist [phone] Please utilize Amion for appropriate phone number to reach the unit pharmacist [facility]) 05/09/2022,9:44 AM

## 2022-05-09 NOTE — Discharge Instructions (Signed)
Information on my medicine - ELIQUIS (apixaban)  This medication education was reviewed with me or my healthcare representative as part of my discharge preparation.   Why was Eliquis prescribed for you? Eliquis was prescribed to treat blood clots that may have been found in the veins of your legs (deep vein thrombosis) or in your lungs (pulmonary embolism) and to reduce the risk of them occurring again.  What do You need to know about Eliquis ? The starting dose is 10 mg (two 5 mg tablets) taken TWICE daily for the FIRST SEVEN (7) DAYS, then on  05-16-2022 the dose is reduced to ONE 5 mg tablet taken TWICE daily.  Eliquis may be taken with or without food.   Try to take the dose about the same time in the morning and in the evening. If you have difficulty swallowing the tablet whole please discuss with your pharmacist how to take the medication safely.  Take Eliquis exactly as prescribed and DO NOT stop taking Eliquis without talking to the doctor who prescribed the medication.  Stopping may increase your risk of developing a new blood clot.  Refill your prescription before you run out.  After discharge, you should have regular check-up appointments with your healthcare provider that is prescribing your Eliquis.    What do you do if you miss a dose? If a dose of ELIQUIS is not taken at the scheduled time, take it as soon as possible on the same day and twice-daily administration should be resumed. The dose should not be doubled to make up for a missed dose.  Important Safety Information A possible side effect of Eliquis is bleeding. You should call your healthcare provider right away if you experience any of the following: Bleeding from an injury or your nose that does not stop. Unusual colored urine (red or dark brown) or unusual colored stools (red or black). Unusual bruising for unknown reasons. A serious fall or if you hit your head (even if there is no bleeding).  Some  medicines may interact with Eliquis and might increase your risk of bleeding or clotting while on Eliquis. To help avoid this, consult your healthcare provider or pharmacist prior to using any new prescription or non-prescription medications, including herbals, vitamins, non-steroidal anti-inflammatory drugs (NSAIDs) and supplements.  This website has more information on Eliquis (apixaban): http://www.eliquis.com/eliquis/home

## 2022-05-09 NOTE — NC FL2 (Signed)
[place] MEDICAID FL2 LEVEL OF CARE SCREENING TOOL     IDENTIFICATION  Patient Name: [name] [name] Birthdate: 1934-09-04 Sex: female Admission Date (Current Location): 05/04/2022  [facility] and [place] Number:  [employer] and Address:  [facility] [place], [place] [address] [place],  [address]      Provider Number: [id number]  Attending Physician Name and Address:  Patrecia Pour, MD  Relative Name and Phone Number:       Current Level of Care: Hospital Recommended Level of Care: [place] Prior Approval Number:    Date Approved/Denied:   PASRR Number: [id number]  Discharge Plan: SNF    Current Diagnoses: Patient Active Problem List   Diagnosis Date Noted   Acute CVA (cerebrovascular accident) ([place]) 05/05/2022   Acute right MCA stroke ([place]) 05/04/2022   Acute pulmonary embolism ([place]) 05/04/2022   Anemia 05/04/2022   Acute ischemic stroke ([place]) 06/17/2021   Transient ischemic attack 06/16/2021   Slurred speech 06/16/2021   Headache 06/16/2021   Hyperglycemia 06/16/2021   Hypothyroidism 06/16/2021   Ischemic cardiomyopathy 08/24/2018   Ectopic atrial rhythm 08/24/2018   Ileus, postoperative ([place]) 05/12/2014   Osteoarthritis of left hip 05/09/2014   Hip arthritis 05/09/2014   Primary osteoarthritis of right hip 10/27/2011   Hip pain 08/24/2011   Chronic diastolic heart failure ([place]) 12/24/2009   SHORTNESS OF BREATH 11/29/2009   Hyperlipidemia 09/17/2008   Essential hypertension 09/17/2008   Coronary artery disease 09/17/2008   CAROTID ARTERY STENOSIS 09/17/2008    Orientation RESPIRATION BLADDER Height & Weight     Self, Situation, Place  Normal Incontinent Weight: 147 lb 0.8 oz (66.7 kg) Height:  5' 1"  (154.9 cm)  BEHAVIORAL SYMPTOMS/MOOD NEUROLOGICAL BOWEL NUTRITION STATUS      Incontinent Diet (see DC summary)  AMBULATORY STATUS COMMUNICATION OF NEEDS Skin   Extensive Assist Verbally Normal                        Personal Care Assistance Level of Assistance  Bathing, Feeding, Dressing Bathing Assistance: Maximum assistance Feeding assistance: Limited assistance Dressing Assistance: Maximum assistance     Functional Limitations Info  Speech     Speech Info: Impaired (dysarthria)    SPECIAL CARE FACTORS FREQUENCY  PT (By licensed PT), OT (By licensed OT), Speech therapy     PT Frequency: 5x/wk OT Frequency: 5x/wk     Speech Therapy Frequency: 5x/wk      Contractures Contractures Info: Not present    Additional Factors Info  Code Status, Allergies Code Status Info: DNR Allergies Info: NKA           Current Medications (05/09/2022):  This is the current hospital active medication list Current Facility-Administered Medications  Medication Dose Route Frequency Provider Last Rate Last Admin   acetaminophen (TYLENOL) tablet 650 mg  650 mg Oral Q4H PRN Tu, Ching T, DO       Or   acetaminophen (TYLENOL) 160 MG/5ML solution 650 mg  650 mg Per Tube Q4H PRN Tu, Ching T, DO       Or   acetaminophen (TYLENOL) suppository 650 mg  650 mg Rectal Q4H PRN Tu, Ching T, DO       apixaban [facility] [place]) Education Kit for DVT/PE patients   Does not apply Once Wendee Beavers, RPH       apixaban (ELIQUIS) tablet 10 mg  10 mg Oral BID Wendee Beavers, [facility]  Followed by   Derrill Memo ON 05/16/2022] apixaban (ELIQUIS) tablet 5 mg  5 mg Oral BID Wendee Beavers, RPH       gabapentin (NEURONTIN) capsule 100 mg  100 mg Oral BID Patrecia Pour, MD   100 mg at 05/08/22 2125   levothyroxine (SYNTHROID) tablet 75 mcg  75 mcg Oral q AM Patrecia Pour, MD   75 mcg at 05/09/22 0556   Oral care mouth rinse  15 mL Mouth Rinse 4 times per day Patrecia Pour, MD   15 mL at 05/08/22 2125   Oral care mouth rinse  15 mL Mouth Rinse PRN Patrecia Pour, MD       pantoprazole (PROTONIX) EC tablet 40 mg  40 mg Oral Daily Patrecia Pour, MD   40 mg at 05/08/22 0840   rosuvastatin (CRESTOR) tablet 40 mg  40 mg Oral Daily  Patrecia Pour, MD   40 mg at 05/08/22 5248   senna-docusate (Senokot-S) tablet 1 tablet  1 tablet Oral Daily Patrecia Pour, MD   1 tablet at 05/08/22 1628   sodium chloride flush (NS) 0.9 % injection 3 mL  3 mL Intravenous Once Isla Pence, MD         Discharge Medications: Please see discharge summary for a list of discharge medications.  Relevant Imaging Results:  Relevant Lab Results:   Additional Information SS#: [id number]  Geralynn Ochs, LCSW

## 2022-05-10 DIAGNOSIS — I5032 Chronic diastolic (congestive) heart failure: Secondary | ICD-10-CM | POA: Diagnosis not present

## 2022-05-10 DIAGNOSIS — I63511 Cerebral infarction due to unspecified occlusion or stenosis of right middle cerebral artery: Secondary | ICD-10-CM | POA: Diagnosis not present

## 2022-05-10 DIAGNOSIS — D649 Anemia, unspecified: Secondary | ICD-10-CM | POA: Diagnosis not present

## 2022-05-10 DIAGNOSIS — I2699 Other pulmonary embolism without acute cor pulmonale: Secondary | ICD-10-CM | POA: Diagnosis not present

## 2022-05-10 MED ORDER — SENNOSIDES-DOCUSATE SODIUM 8.6-50 MG PO TABS: 1.0000 | ORAL_TABLET | Freq: Every day | ORAL | Status: AC

## 2022-05-10 MED ORDER — APIXABAN 5 MG PO TABS: ORAL_TABLET | ORAL | Status: AC

## 2022-05-10 NOTE — Plan of Care (Signed)
  Problem: Education: Goal: Knowledge of General Education information will improve Description Including pain rating scale, medication(s)/side effects and non-pharmacologic comfort measures Outcome: Progressing   Problem: Health Behavior/Discharge Planning: Goal: Ability to manage health-related needs will improve Outcome: Progressing   

## 2022-05-10 NOTE — Discharge Summary (Signed)
Physician Discharge Summary   Patient: [name] [name] MRN: [id number] DOB: January 17, 1934  Admit date:     05/04/2022  Discharge date: 05/10/2022   Discharge Physician: Patrecia Pour   PCP: Ludwig Clarks, FNP   Recommendations at discharge:  Palliative care to follow with patient at SNF. Pt is DNR, though would be open to PEG if needed and desires full scope of recommended medical care and rehabilitation at this time.  Recommend follow up with neurology in 6-8 weeks.   Discharge Diagnoses: Principal Problem:   Acute right MCA stroke ([place]) Active Problems:   Essential hypertension   Chronic diastolic heart failure (HCC)   Slurred speech   Hypothyroidism   Acute pulmonary embolism (HCC)   Anemia   Acute CVA (cerebrovascular accident) [facility])  Hospital Course: Per H&P: 88 y.o. female with medical history significant of stroke on aspirin and Plavix left-sided hemiparesis, CABG, hypertension, chronic diastolic heart failure, and hypothyroidism who presents with concerns of dysphagia.   Patient was recently hospitalized at [facility] overnight about 2 days ago.  Unfortunately unable to see records.  She presented there with a headache and reportedly was admitted for hypokalemia.  Family believes a CT head scan was done and her headache also resolved at discharge.  The night following discharge she was doing well and able to swallow her pills.  Then this morning when she woke up daughter noticed she was drooling all over when she attempted to take her pills.  Then again when she tried eating breakfast prompting daughter to take her to the ED.  Patient denies any headache or blurry vision.  No chest pain, shortness of breath.  No nausea, vomiting. Daughter also notes that about 3 weeks ago she was prescribed potassium supplementation and had persistent dark-colored diarrhea.  This resolved intermittently in terms of both diarrhea and her stool returning to brown color, but again about 6 days ago  there was another episode of dark stool.  She is on aspirin and Plavix for her CVA   In the ED, she was afebrile and normotensive on room air. Had mild leukocytosis of 11.3, hemoglobin 10.9 which appears to be downward trending 12.8 back in 06/2021.  No other significant electrolyte abnormalities.   EKG my review sinus rhythm and slight ST depression in V4 to V6 which is also seen on prior EKGs.     CT head with new small ill-defined hypodensity at the right cerebral peduncle. MRI brain revealing for acute right MCA distribution infarct involving the right insula and overlying the right frontal lobe.   CT chest with large bilateral central pulmonary artery emboli extending into the lobar branch with no evidence of right heart strain and evidence of right heart strain.  Trace left pleural effusion with developing infarct not excluded.   ED PA discussed with neurology Dr. Cheral Marker and after extensive discussion with him and with patient and her family.  They have decided to initiate IV heparin for a PE in the setting of new stroke.  Hospitalist then consult for admission."  Hospital course: The patient tolerated anticoagulation with some downward trend of hemoglobin that has since stabilized in 8's with no evidence of bleeding throughout hospitalization. U/S additionally revealed acute DVT. No right heart strain. Due to anticoagulation, DAPT has been discontinued. The patient has worked with SLP and cleared for diet with aspiration precautions. She is tolerating enteral nutrition/hydration and oral medications. PT and OT have recommended SNF rehabilitation which is pursued per patient preference  over returning home with home health. Please see details below.  Assessment and Plan: Acute right MCA stroke: acute right MCA infarct involving insula and right frontal lobe, atrophy and chronic microvascular ischemic disease - On eliquis (PTA was DAPT) due to PE/DVT.  - LDL is 42, continue rosuvastatin 40mg   daily.  - PT/OT evaluations recommending SNF.     Possible acute blood loss anemia on anemia of chronic disease: Anemia panel consistent with chronic inflammation (TIBC low at 123, 65% saturation, ferritin of 491. Iron 80).   - H/H is stable x48 hours with no bleeding clinically. BUN low, not consistent with UGIB. Transitioned heparin to [place] 8/4. - Has no objection to transfusion, which was discussed with her and family. Suggest recheck CBC within the next week.   Acute pulmonary embolism, acute right CFV DVT: CT chest with contrast (not CTA) with large bilateral central pulmonary artery emboli extending into the lobar branch with no evidence of right heart strain and evidence of right heart strain. I have reviewed this personally and agree with radiologist interpretation. Also with acute right CFV DVT, likely provoked by bedbound status which is not likely to change.  - Continue anticoagulation, planning long term eliquis.    Constipation: Abd exam benign. This symptom argues against GI bleeding. - Senna ordered, has had BM, LBM 8/5.   GERD: Chronic, stable - Continue PPI   Dysphagia: SLP evaluation most recently 8/4: "Pt believes that she has been doing better on her full liquid diet, also reporting less coughing during meals. SLP provided trials of thin liquids as well as advanced trials of purees for practice with oral control and clearance. She still has a significant amount of L anterior loss of purees with visibly incomplete labial closure, although L pocketing might be a little less in volume. Pt needs consistent cues to perform a second swallow but there is still coughing noted in between trials of puree. She has difficulty managing L oral deficits despite cues. She prefers to stay on full liquid diet for now." - Tolerating adequate hydration and nutrition at this time. Hoping to avoid need for PEG. Palliative consultation appreciated, they should follow at SNF.     Hypokalemia:  Normalized with supplementation and restart of diet.    Hypothyroidism - Continue synthroid   Slurred speech due to CVA:  - SLP evaluation appreciated.      Chronic HFpEF, CAD: Stable. 3-vessel calcification noted on CT, followed by [facility]. No recent angina or CHF symptoms. - Continue medical therapy.   Essential hypertension: - Allowed permissive HTN, now will restart norvasc, due to new right MCA stroke  DNR: POA. Gold form signed.  Consultants: Neurology Procedures performed: None  Disposition: Skilled nursing facility Diet recommendation:  Full liquid diet DISCHARGE MEDICATION: Allergies as of 05/10/2022   No Known Allergies      Medication List     STOP taking these medications    aspirin EC 81 MG tablet   clopidogrel 75 MG tablet Commonly known as: PLAVIX       TAKE these medications    acetaminophen 500 MG tablet Commonly known as: TYLENOL Take 1,000 mg by mouth every 6 (six) hours as needed for mild pain.   amLODipine 10 MG tablet Commonly known as: NORVASC Take 1 tablet (10 mg total) by mouth daily.   apixaban 5 MG Tabs tablet Commonly known as: ELIQUIS Take 2 tablets (10 mg total) by mouth 2 (two) times daily for 6 days, THEN 1 tablet (  5 mg total) 2 (two) times daily. Start taking on: May 10, 2022   cyclobenzaprine 5 MG tablet Commonly known as: FLEXERIL Take 5 mg by mouth 2 (two) times daily as needed for muscle spasms.   diclofenac Sodium 1 % Gel Commonly known as: VOLTAREN Apply 2 g topically daily as needed for pain.   esomeprazole 40 MG capsule Commonly known as: NEXIUM Take 40 mg by mouth daily.   fluticasone 50 MCG/ACT nasal spray Commonly known as: FLONASE Place 1 spray into both nostrils daily. What changed:  when to take this reasons to take this   gabapentin 100 MG capsule Commonly known as: NEURONTIN Take 100 mg by mouth 2 (two) times daily.   levothyroxine 75 MCG tablet Commonly known as: SYNTHROID Take 75  mcg by mouth daily.   nitroGLYCERIN 0.4 MG SL tablet Commonly known as: NITROSTAT Place 1 tablet (0.4 mg total) under the tongue every 5 (five) minutes as needed for chest pain.   potassium chloride 20 MEQ packet Commonly known as: KLOR-CON Take 20 mEq by mouth daily.   rosuvastatin 40 MG tablet Commonly known as: CRESTOR TAKE 1 TABLET BY MOUTH ONCE DAILY.   senna-docusate 8.6-50 MG tablet Commonly known as: Senokot-S Take 1 tablet by mouth daily. Start taking on: May 11, 2022        Contact information for follow-up providers     Ludwig Clarks, FNP Follow up.   Specialty: Family Medicine Contact information: [place] [id number] [phone]              Contact information for after-discharge care     Destination     [facility] .   Service: Skilled Nursing Contact information: [address] [place] [phone]                    Discharge Exam: Danley Danker Weights   05/04/22 1505  Weight: 66.7 kg  Pleasant elderly female in no distress Clear, nonlabored RRR, no edema Soft, NT, ND, +BS Alert, oriented, dysarthria and left facial droop stable. 5/5 strength to RUE and RLE, 0/5 to LUE, 1/5 to LLE  Condition at discharge: stable  The results of significant diagnostics from this hospitalization (including imaging, microbiology, ancillary and laboratory) are listed below for reference.   Imaging Studies: VAS [place] UPPER EXTREMITY VENOUS DUPLEX  Result Date: 05/08/2022 UPPER VENOUS STUDY  Patient Name:  LANINA OFFUTT  Date of Exam:   05/08/2022 Medical Rec #: [id number]       Accession #:    [id number] Date of Birth: Feb 22, 1934        Patient Gender: F Patient Age:   29 years Exam Location:  [facility] Procedure:      VAS [place] UPPER EXTREMITY VENOUS DUPLEX Referring Phys: Vance Gather --------------------------------------------------------------------------------   Indications: Edema Comparison Study: No previous exam noted. Performing Technologist: Bobetta Lime BS, RVT  Examination Guidelines: A complete evaluation includes B-mode imaging, spectral Doppler, color Doppler, and power Doppler as needed of all accessible portions of each vessel. Bilateral testing is considered an integral part of a complete examination. Limited examinations for reoccurring indications may be performed as noted.  Right Findings: +----------+------------+---------+-----------+----------+-------+ RIGHT     CompressiblePhasicitySpontaneousPropertiesSummary +----------+------------+---------+-----------+----------+-------+ Subclavian    Full       Yes       Yes                      +----------+------------+---------+-----------+----------+-------+  Left Findings: +----------+------------+---------+-----------+----------+-------+ LEFT      CompressiblePhasicitySpontaneousPropertiesSummary +----------+------------+---------+-----------+----------+-------+ IJV           Full       Yes       Yes                      +----------+------------+---------+-----------+----------+-------+ Subclavian    Full       Yes       Yes                      +----------+------------+---------+-----------+----------+-------+ Axillary      Full       Yes       Yes                      +----------+------------+---------+-----------+----------+-------+ Brachial      Full                                          +----------+------------+---------+-----------+----------+-------+ Radial        Full                                          +----------+------------+---------+-----------+----------+-------+ Ulnar         Full                                          +----------+------------+---------+-----------+----------+-------+ Cephalic      Full                                          +----------+------------+---------+-----------+----------+-------+  Basilic       Full                                          +----------+------------+---------+-----------+----------+-------+  Summary:  Right: No evidence of thrombosis in the subclavian.  Left: No evidence of deep vein thrombosis in the upper extremity. No evidence of superficial vein thrombosis in the upper extremity.  *See table(s) above for measurements and observations.  Diagnosing physician: Jamelle Haring Electronically signed by Jamelle Haring on 05/08/2022 at 4:35:25 PM.    Final    VAS [place] LOWER EXTREMITY VENOUS (DVT)  Result Date: 05/08/2022  Lower Venous DVT Study Patient Name:  JANETE RECHNER [facility]  Date of Exam:   05/08/2022 Medical Rec #: [id number]       Accession #:    [id number] Date of Birth: 1934-07-22        Patient Gender: F Patient Age:   88 years Exam Location:  [facility] Procedure:      VAS [place] LOWER EXTREMITY VENOUS (DVT) Referring Phys: Vance Gather --------------------------------------------------------------------------------  Indications: Edema.  Comparison Study: No previous exam noted. Performing Technologist: Bobetta Lime BS, RVT  Examination Guidelines: A complete evaluation includes B-mode imaging, spectral Doppler, color Doppler, and power Doppler as needed of all accessible portions of each vessel. Bilateral testing is considered an integral part of a complete examination. Limited examinations for  reoccurring indications may be performed as noted. The reflux portion of the exam is performed with the patient in reverse Trendelenburg.  +---------+---------------+---------+-----------+----------+--------------+ RIGHT    CompressibilityPhasicitySpontaneityPropertiesThrombus Aging +---------+---------------+---------+-----------+----------+--------------+ CFV      Partial        Yes      Yes                                 +---------+---------------+---------+-----------+----------+--------------+ SFJ      Full                                                         +---------+---------------+---------+-----------+----------+--------------+ FV Prox  Full                                                        +---------+---------------+---------+-----------+----------+--------------+ FV Mid   Full                                                        +---------+---------------+---------+-----------+----------+--------------+ FV DistalFull                                                        +---------+---------------+---------+-----------+----------+--------------+ PFV      Full                                                        +---------+---------------+---------+-----------+----------+--------------+ POP      Full           Yes      Yes                                 +---------+---------------+---------+-----------+----------+--------------+ PTV      Full                                                        +---------+---------------+---------+-----------+----------+--------------+ PERO     Full                                                        +---------+---------------+---------+-----------+----------+--------------+   +---------+---------------+---------+-----------+----------+--------------+ LEFT     CompressibilityPhasicitySpontaneityPropertiesThrombus Aging +---------+---------------+---------+-----------+----------+--------------+ CFV      Full  Yes      Yes                                 +---------+---------------+---------+-----------+----------+--------------+ SFJ      Full                                                        +---------+---------------+---------+-----------+----------+--------------+ FV Prox  Full                                                        +---------+---------------+---------+-----------+----------+--------------+ FV Mid   Full                                                         +---------+---------------+---------+-----------+----------+--------------+ FV DistalFull                                                        +---------+---------------+---------+-----------+----------+--------------+ PFV      Full                                                        +---------+---------------+---------+-----------+----------+--------------+ POP      Full           Yes      Yes                                 +---------+---------------+---------+-----------+----------+--------------+ PTV      Full                                                        +---------+---------------+---------+-----------+----------+--------------+ PERO     Full                                                        +---------+---------------+---------+-----------+----------+--------------+     Summary: BILATERAL: -No evidence of popliteal cyst, bilaterally. RIGHT: - Findings consistent with acute deep vein thrombosis involving the right common femoral vein. - Non-occlusive acute thrombus at the distal common femoral vein.  LEFT: - No evidence of deep vein thrombosis in the lower extremity. No indirect evidence of obstruction proximal to the inguinal ligament.  *See table(s) above for measurements and observations. Electronically signed by Maisie Fus  Hawken on 05/08/2022 at 4:33:49 PM.    Final    ECHOCARDIOGRAM COMPLETE  Result Date: 05/05/2022    ECHOCARDIOGRAM REPORT   Patient Name:   FALAK FRITSCH [facility] Date of Exam: 05/05/2022 Medical Rec #:  [id number]      Height:       61.0 in Accession #:    [id number]     Weight:       147.0 lb Date of Birth:  27-Dec-1933       BSA:          1.657 m Patient Age:    5 years       BP:           132/61 mmHg Patient Gender: F              HR:           64 bpm. Exam Location:  Inpatient Procedure: 2D Echo, Cardiac Doppler, Color Doppler and Saline Contrast Bubble            Study Indications:    Stroke  History:        Patient has prior history of  Echocardiogram examinations, most                 recent 06/17/2021. CHF, Prior CABG; Risk Factors:Hypertension.  Sonographer:    Clayton Lefort RDCS (AE) Referring Phys: [id number] [place] T TU IMPRESSIONS  1. Left ventricular ejection fraction, by estimation, is 60 to 65%. The left ventricle has normal function. Left ventricular endocardial border not optimally defined to evaluate regional wall motion. Left ventricular diastolic parameters are consistent with Grade I diastolic dysfunction (impaired relaxation).  2. Right ventricular systolic function is normal. The right ventricular size is normal. There is normal pulmonary artery systolic pressure.  3. The mitral valve is grossly normal. Mild mitral valve regurgitation.  4. The aortic valve was not well visualized. Aortic valve regurgitation is not visualized.  5. The inferior vena cava is normal in size with greater than 50% respiratory variability, suggesting right atrial pressure of 3 mmHg. Comparison(s): Compared to prior TTE 06/2021, there is no significant change. Conclusion(s)/Recommendation(s): No intracardiac source of embolism detected on this transthoracic study. Consider a transesophageal echocardiogram to exclude cardiac source of embolism if clinically indicated. FINDINGS  Left Ventricle: Left ventricular ejection fraction, by estimation, is 60 to 65%. The left ventricle has normal function. Left ventricular endocardial border not optimally defined to evaluate regional wall motion. The left ventricular internal cavity size was normal in size. There is no left ventricular hypertrophy. Left ventricular diastolic parameters are consistent with Grade I diastolic dysfunction (impaired relaxation). Right Ventricle: The right ventricular size is normal. No increase in right ventricular wall thickness. Right ventricular systolic function is normal. There is normal pulmonary artery systolic pressure. The tricuspid regurgitant velocity is 2.59 m/s, and  with an assumed  right atrial pressure of 8 mmHg, the estimated right ventricular systolic pressure is [id number] mmHg. Left Atrium: Left atrial size was normal in size. Right Atrium: Right atrial size was normal in size. Pericardium: There is no evidence of pericardial effusion. Mitral Valve: The mitral valve is grossly normal. There is mild thickening of the mitral valve leaflet(s). There is mild calcification of the mitral valve leaflet(s). Mild mitral valve regurgitation. Tricuspid Valve: The tricuspid valve is normal in structure. Tricuspid valve regurgitation is trivial. Aortic Valve: The aortic valve was not well visualized. Aortic valve regurgitation is not visualized. Aortic valve mean gradient measures 3.0 mmHg.  Aortic valve peak gradient measures 6.7 mmHg. Aortic valve area, by VTI measures 2.67 cm. Pulmonic Valve: The pulmonic valve was not well visualized. Aorta: The aortic root is normal in size and structure. Venous: The inferior vena cava is normal in size with greater than 50% respiratory variability, suggesting right atrial pressure of 3 mmHg. IAS/Shunts: The atrial septum is grossly normal. Agitated saline contrast was given intravenously to evaluate for intracardiac shunting.  LEFT VENTRICLE PLAX 2D LVIDd:         4.00 cm   Diastology LVIDs:         2.60 cm   LV e' medial:    5.11 cm/s LV PW:         0.70 cm   LV E/e' medial:  14.1 LV IVS:        0.60 cm   LV e' lateral:   6.53 cm/s LVOT diam:     1.90 cm   LV E/e' lateral: 11.1 LV SV:         69 LV SV Index:   42 LVOT Area:     2.84 cm  IVC IVC diam: 0.90 cm LEFT ATRIUM           Index        RIGHT ATRIUM           Index LA diam:      2.70 cm 1.63 cm/m   RA Area:     12.10 cm LA Vol (A4C): 23.9 ml 14.42 ml/m  RA Volume:   28.60 ml  17.26 ml/m  AORTIC VALVE AV Area (Vmax):    2.66 cm AV Area (Vmean):   2.77 cm AV Area (VTI):     2.67 cm AV Vmax:           129.00 cm/s AV Vmean:          74.900 cm/s AV VTI:            0.258 m AV Peak Grad:      6.7 mmHg AV Mean  Grad:      3.0 mmHg LVOT Vmax:         121.00 cm/s LVOT Vmean:        73.100 cm/s LVOT VTI:          0.243 m LVOT/AV VTI ratio: 0.94  AORTA Ao Root diam: 2.60 cm MITRAL VALVE                TRICUSPID VALVE MV Area (PHT): 2.42 cm     TR Peak grad:   26.8 mmHg MV Decel Time: 313 msec     TR Vmax:        259.00 cm/s MV E velocity: 72.20 cm/s MV A velocity: 126.00 cm/s  SHUNTS MV E/A ratio:  0.57         Systemic VTI:  0.24 m                             Systemic Diam: 1.90 cm Gwyndolyn Kaufman MD Electronically signed by Gwyndolyn Kaufman MD Signature Date/Time: 05/05/2022/3:38:39 PM    Final    DG Swallowing Func-Speech Pathology  Result Date: 05/05/2022 Table formatting from the original result was not included. Objective Swallowing Evaluation: Type of Study: MBS-Modified Barium Swallow Study  Patient Details Name: SHAKEARA FONT MRN: [id number] Date of Birth: 07/30/1934 Today's Date: 05/05/2022 Time: SLP Start Time (ACUTE ONLY): 1330 -SLP Stop Time (ACUTE ONLY): 1346 SLP  Time Calculation (min) (ACUTE ONLY): 16 min Past Medical History: Past Medical History: Diagnosis Date  Ankle edema   Arthritis   right hip  Bronchitis   in Dec  CAD (coronary artery disease)   a. s/p DES to LAD in 2008, NST in 2015 and 2017 which were low-risk  Carotid stenosis   mild  Cataract   left;not ready to come off  Constipation   takes OTC stool softener as needed  Diabetes mellitus   pt doesn't take any meds for it  DJD (degenerative joint disease) of hip   right  Dry cough   GERD (gastroesophageal reflux disease)   Headache(784.0)   occasionally  HTN (hypertension)   takes Amlodpine and Carvedilol daily  Hx of cardiovascular stress test   Lexiscan Myoview (04/2014):  No ischemia, EF 64%, Low Risk  Hypercholesteremia   takes Crestor daily  Hypothyroidism   takes Synthroid daily  Ileus, postoperative ([place]) 05/12/2014  Myocardial infarction [facility])   pt unsure but thinks about 49yrs ago;is taking Plavix daily   Nocturia   Osteoarthritis of left  hip 05/09/2014  Primary osteoarthritis of right hip 10/27/2011 Past Surgical History: Past Surgical History: Procedure Laterality Date  ABDOMINAL HYSTERECTOMY  20+yrs  BACK SURGERY  15+yrs ago  CARDIAC CATHETERIZATION  2008  CORONARY ARTERY BYPASS GRAFT    LYMPH NODE DISSECTION  20+yrs ago  right   TOTAL HIP ARTHROPLASTY  10/27/2011  Procedure: TOTAL HIP ARTHROPLASTY;  Surgeon: Johnny Bridge, MD;  Location: [place];  Service: Orthopedics;  Laterality: Right;  TOTAL HIP ARTHROPLASTY Left 05/09/2014  Procedure: LEFT TOTAL HIP ARTHROPLASTY;  Surgeon: Johnny Bridge, MD;  Location: [place];  Service: Orthopedics;  Laterality: Left; HPI: Pt is an 88 yo female presenting with concerns of dysphagia. She was recently admitted to [facility] for hypokalemia but the morning after being discharged, she was found to be drooling and unable to take her pills or swallow her breakfast. MRI showed acute R MCA infarct involving the R insula nd overlying R frontal lobe. CT Chest also revealed large PE. PMH includes: stroke with L-sided hemiparesis, h/o feeding tube (removed earlier this year), CABG, HTN, CHF, hypothyroidism  Subjective: pt says she couldn't swallow her oatmeal at home, thinks she's feeling a little better here  Recommendations for follow up therapy are one component of a multi-disciplinary discharge planning process, led by the attending physician.  Recommendations may be updated based on patient status, additional functional criteria and insurance authorization. Assessment / Plan / Recommendation   05/05/2022   1:00 PM Clinical Impressions Clinical Impression Pt presents with an oral more than pharyngeal dysphagia. She has incomplete L labial seal that results in significant bolus loss with thin liquids via cup, although with improved closure noted around a straw. Less anterior loss was noted with purees compared to clinical assessment earlier today, but oral residue noted with all consistencies is the most significant  with purees. She requires cues to manage oral residue, saying that she does not feel it in her mouth. SLP provided visual feedback as well, allowing pt to watch MBS video in real time to try to facilitate oral clearance. A cued second swallow is generally effective but takes a little extra time to initiate. Pharyngeally she has reduced hyolaryngea movement and pharyngeal squeeze. Visibility throughout study somewhat limited by pt positioning with shadow from shoulder as well as residue from pyriform sinuses superimposed over laryngeal vestibule, although she does appear to have moments of trace penetration from  thin liquids during the swallow. Pt also had mild residue at the pyriform sinuses with incomplete entrance into the UES as well as occasional backflow. Retrograde flow also noted during brief esophageal sweep (although could not scan the entire esophagus). Recommend starting Dys 1 diet and thin liquids with full supervision. Would use two swallows per bolus and take one small sip at a time. SLP Visit Diagnosis Dysphagia, unspecified (R13.10) Impact on safety and function Mild aspiration risk;Moderate aspiration risk;Risk for inadequate nutrition/hydration     05/05/2022   1:00 PM Treatment Recommendations Treatment Recommendations Therapy as outlined in treatment plan below     05/05/2022   1:00 PM Prognosis Prognosis for Safe Diet Advancement Good Barriers to Reach Goals Cognitive deficits   05/05/2022   1:00 PM Diet Recommendations SLP Diet Recommendations Dysphagia 1 (Puree) solids;Thin liquid Liquid Administration via Straw Medication Administration Crushed with puree Compensations Minimize environmental distractions;Slow rate;Small sips/bites;Multiple dry swallows after each bite/sip Postural Changes Seated upright at 90 degrees;Remain semi-upright after after feeds/meals (Comment)     05/05/2022   1:00 PM Other Recommendations Oral Care Recommendations Oral care BID Follow Up Recommendations Acute inpatient  rehab (3hours/day) Assistance recommended at discharge Frequent or constant Supervision/Assistance Functional Status Assessment Patient has had a recent decline in their functional status and demonstrates the ability to make significant improvements in function in a reasonable and predictable amount of time.   05/05/2022   1:00 PM Frequency and Duration  Speech Therapy Frequency (ACUTE ONLY) min 2x/week Treatment Duration 2 weeks     05/05/2022   1:00 PM Oral Phase Oral Phase Impaired Oral - Nectar Straw Left anterior bolus loss;Reduced posterior propulsion;Lingual/palatal residue Oral - Thin Cup Left anterior bolus loss;Reduced posterior propulsion;Lingual/palatal residue Oral - Thin Straw Left anterior bolus loss;Reduced posterior propulsion;Lingual/palatal residue Oral - Puree Left anterior bolus loss;Reduced posterior propulsion;Lingual/palatal residue    05/05/2022   1:00 PM Pharyngeal Phase Pharyngeal Phase Impaired Pharyngeal- Thin Cup Reduced anterior laryngeal mobility;Reduced laryngeal elevation;Reduced airway/laryngeal closure;Pharyngeal residue - pyriform Pharyngeal- Thin Straw Reduced anterior laryngeal mobility;Reduced laryngeal elevation;Reduced airway/laryngeal closure;Pharyngeal residue - pyriform;Penetration/Aspiration during swallow Pharyngeal Material enters airway, remains ABOVE vocal cords and not ejected out Pharyngeal- Puree Reduced anterior laryngeal mobility;Reduced laryngeal elevation;Reduced airway/laryngeal closure;Pharyngeal residue - pyriform    05/05/2022   1:00 PM Cervical Esophageal Phase  Cervical Esophageal Phase Impaired Nectar Straw Reduced cricopharyngeal relaxation Thin Cup Reduced cricopharyngeal relaxation Thin Straw Reduced cricopharyngeal relaxation Puree Reduced cricopharyngeal relaxation Osie Bond., M.A. [place] [facility] [phone] Secure chat preferred 05/05/2022, 3:06 PM                     EEG adult  Result Date: 05/05/2022 Lora Havens, MD     05/05/2022 12:45 PM Patient Name: ELEONORA PERUZZI MRN: [id number] Epilepsy Attending: Lora Havens Referring Physician/Provider: Kerney Elbe, MD Date: 05/05/2022 Duration: 22.07 mins Patient history: 88 year old female with right frontal infarct.  EEG to evaluate for stroke. Level of alertness: Awake, drowsy AEDs during EEG study: None Technical aspects: This EEG study was done with scalp electrodes positioned according to the 10-20 International system of electrode placement. Electrical activity was reviewed with band pass filter of 1-70Hz , sensitivity of 7 uV/mm, display speed of 50mm/sec with a 60Hz  notched filter applied as appropriate. EEG data were recorded continuously and digitally stored.  Video monitoring was available and reviewed as appropriate. Description: The posterior dominant rhythm consists of 8-9 Hz activity of moderate voltage (25-35 uV) seen predominantly  in posterior head regions, symmetric and reactive to eye opening and eye closing. Drowsiness was characterized by attenuation of the posterior background rhythm.  EEG showed intermittent 3 to 6 Hz theta-delta slowing in right temporal region.  Hyperventilation and photic stimulation were not performed.   ABNORMALITY - Intermittent slow, right temporal region IMPRESSION: This study is suggestive of cortical dysfunction in right temporal region likely secondary to underlying stroke.  No seizures or epileptiform discharges were seen throughout the recording. Lora Havens   CT ANGIO HEAD NECK W WO CM  Result Date: 05/05/2022 CLINICAL DATA:  Stroke follow-up EXAM: CT ANGIOGRAPHY HEAD AND NECK TECHNIQUE: Multidetector CT imaging of the head and neck was performed using the standard protocol during bolus administration of intravenous contrast. Multiplanar CT image reconstructions and MIPs were obtained to evaluate the vascular anatomy. Carotid stenosis measurements (when applicable) are obtained utilizing NASCET criteria, using the  distal internal carotid diameter as the denominator. RADIATION DOSE REDUCTION: This exam was performed according to the departmental dose-optimization program which includes automated exposure control, adjustment of the mA and/or kV according to patient size and/or use of iterative reconstruction technique. CONTRAST:  23mL OMNIPAQUE IOHEXOL 350 MG/ML SOLN COMPARISON:  Head CT and brain MRI from yesterday FINDINGS: CT HEAD FINDINGS Brain: Known cytotoxic edema in the right frontal cortex from acute infarct. Small remote anterior right frontal and left frontal parietal infarcts. Chronic small vessel ischemia. Chronic lacunar infarcts in the left pons and right caudate. No acute hemorrhage, hydrocephalus, or collection. Vascular: See below Skull: Normal. Negative for fracture or focal lesion. Sinuses/Orbits: No acute finding. Review of the MIP images confirms the above findings CTA NECK FINDINGS Aortic arch: Partial coverage of the arch shows atherosclerosis without aneurysm. Right carotid system: Mild for age atheromatous plaque at the bifurcation without flow reducing stenosis or ulceration. Left carotid system: Moderate mainly calcified plaque at the bifurcation without flow reducing stenosis. Mixed density plaque at the common carotid. Negative for ulceration or beading Vertebral arteries: No proximal subclavian stenosis or irregularity. The right vertebral artery is dominant. The vertebral arteries are widely patent to the level of the dura. Skeleton: Generalized cervical spine degeneration. No acute or aggressive finding. Other neck: 9 mm right thyroid nodule. No followup recommended (ref: J Am Coll Radiol. 2015 Feb;12(2): 143-50). Upper chest: No acute finding. Review of the MIP images confirms the above findings CTA HEAD FINDINGS Anterior circulation: Confluent atheromatous plaque on the carotid siphons. Atheromatous irregularity of medium size branches which is also generalized. No major branch occlusion or  correctable flow limiting stenosis. The affected right MCA branch may be recanalized. Negative for aneurysm Posterior circulation: Dominant right vertebral artery showing calcified plaque. The vertebral and basilar arteries are diffusely patent. Atheromatous irregularity of the posterior cerebral arteries including at least moderate right PCA stenosis before the bifurcation. High-grade narrowing of the left PCA at its bifurcation. Venous sinuses: Patent Anatomic variants: None significant Review of the MIP images confirms the above findings IMPRESSION: Cervical and intracranial atherosclerosis as described. No flow limiting stenosis or ulceration underlying the acute right MCA territory infarct. Electronically Signed   By: Jorje Guild M.D.   On: 05/05/2022 05:01   MR BRAIN WO CONTRAST  Result Date: 05/04/2022 CLINICAL DATA:  Initial evaluation for neuro deficit, stroke suspected. EXAM: MRI HEAD WITHOUT CONTRAST TECHNIQUE: Multiplanar, multiecho pulse sequences of the brain and surrounding structures were obtained without intravenous contrast. COMPARISON:  Prior CT from earlier the same day. FINDINGS: Brain: Generalized age-related cerebral  atrophy. Patchy and confluent T2/FLAIR hyperintensity involving the periventricular and deep white matter both cerebral hemispheres, most consistent with chronic small vessel ischemic disease, moderate to advanced in nature. Patchy involvement of the pons noted. Multiple scattered superimposed remote lacunar infarcts present about the hemispheric cerebral white matter, thalamocapsular regions, and pons. Remote right frontal cortical infarct noted. Few scattered foci of chronic hemosiderin staining noted about these infarcts. Evidence for wallerian degeneration at the right cerebral peduncle. Restricted diffusion involving the right insula and overlying right frontal lobe, consistent with acute right MCA distribution infarct (series 2, image 29). No associated hemorrhage or  mass effect. No other evidence for acute or subacute ischemia. Gray-white matter differentiation otherwise maintained. No acute intracranial hemorrhage. No mass lesion, midline shift or mass effect. Ventricular prominence related global parenchymal volume loss without hydrocephalus. No extra-axial fluid collection. Empty sella noted. Vascular: Major intracranial vascular flow voids are maintained. Skull and upper cervical spine: Craniocervical junction within normal limits. Moderate spondylosis noted at C5-6. Bone marrow signal intensity normal. No scalp soft tissue abnormality. Sinuses/Orbits: Globes and orbital soft tissues within normal limits. Mild scattered mucosal thickening noted about the ethmoidal air cells and maxillary sinuses. Trace right mastoid effusion, of doubtful significance. Other: None. IMPRESSION: 1. Acute right MCA distribution infarct involving the right insula and overlying right frontal lobe. No associated hemorrhage or mass effect. 2. Underlying age-related cerebral atrophy with moderate to advanced chronic microvascular ischemic disease, with multiple remote infarcts as above. Electronically Signed   By: Jeannine Boga M.D.   On: 05/04/2022 21:48   CT Chest W Contrast  Result Date: 05/04/2022 CLINICAL DATA:  Respiratory illness. EXAM: CT CHEST WITH CONTRAST TECHNIQUE: Multidetector CT imaging of the chest was performed during intravenous contrast administration. RADIATION DOSE REDUCTION: This exam was performed according to the departmental dose-optimization program which includes automated exposure control, adjustment of the mA and/or kV according to patient size and/or use of iterative reconstruction technique. CONTRAST:  59mL OMNIPAQUE IOHEXOL 300 MG/ML  SOLN COMPARISON:  Chest radiograph dated 05/04/2022. FINDINGS: Cardiovascular: There is no cardiomegaly or pericardial effusion. Advanced 3 vessel coronary vascular calcification. Mild atherosclerotic calcification of the  thoracic aorta. No aneurysmal dilatation. The origins of the great vessels of the aortic arch appear patent. Evaluation of the pulmonary arteries is limited there is a timing of the contrast and suboptimal opacification. However, a large bilateral central pulmonary artery emboli extending into the lobar branches noted. No evidence of right heart straining. Mediastinum/Nodes: No hilar or mediastinal adenopathy. The esophagus is grossly unremarkable. There is a 13 mm right thyroid hypodense nodule. In the setting of significant comorbidities or limited life expectancy, no follow-up recommended (ref: J Am Coll Radiol. 2015 Feb;12(2): 143-50).No mediastinal fluid collection. Lungs/Pleura: Trace left pleural effusion. There are bibasilar dependent atelectasis. A somewhat wedge shaped pleural based ground-glass area at the left lung base may represent atelectasis. Developing infarct is not excluded. There is no pneumothorax. The central airways are patent. Upper Abdomen: Indeterminate 2 x 2 cm enhancing area in the posterior right lobe of the liver, not characterized on this CT but may represent a hemangioma or vascular shunting. This can be better evaluated with MRI or ultrasound on a nonemergent/outpatient basis. Partially visualized gallbladder with possible small pericholecystic fluid or small stone versus calcification. Right upper quadrant ultrasound may provide better evaluation. Small bilateral renal hypodense lesions are not characterized. Indeterminate 2.5 x 1.2 cm fluid attenuating structure in the left upper abdomen (123/3). Evaluation of the upper abdominal structures  is very limited due to respiratory motion and partial visualization. Musculoskeletal: Osteopenia with degenerative changes of spine. No acute osseous pathology. IMPRESSION: 1. Large bilateral central pulmonary artery emboli extending into the lobar branches. No evidence of right heart straining. 2. Trace left pleural effusion with bibasilar  dependent atelectasis. Developing infarct is not excluded. 3. Other nonemergent findings as above. 4. Aortic Atherosclerosis (ICD10-I70.0). These results were called by telephone at the time of interpretation on 05/04/2022 at 8:00 pm to provider [facility] , who verbally acknowledged these results. Electronically Signed   By: Elgie Collard M.D.   On: 05/04/2022 20:01   CT HEAD WO CONTRAST  Result Date: 05/04/2022 CLINICAL DATA:  Patient recently discharged after being hospitalized for stroke. New difficulty swallowing this morning. EXAM: CT HEAD WITHOUT CONTRAST TECHNIQUE: Contiguous axial images were obtained from the base of the skull through the vertex without intravenous contrast. RADIATION DOSE REDUCTION: This exam was performed according to the departmental dose-optimization program which includes automated exposure control, adjustment of the mA and/or kV according to patient size and/or use of iterative reconstruction technique. COMPARISON:  CT head 07/19/2021. FINDINGS: Brain: New small ill-defined hypodensity at the right cerebral peduncle (series 3, image 15). No evidence of acute hemorrhage, hydrocephalus, extra-axial collection or mass lesion/mass effect. Chronic infarcts noted in the anterior and mid right frontal lobe as well as the left aspect of the pons. Vascular: Vascular calcifications at the skull base. No hyperdense vessel or unexpected calcification. Skull: Normal. Negative for fracture or focal lesion. Sinuses/Orbits: No acute finding. Trace mucosal thickening in the left maxillary sinus. Hypoplastic left frontal sinus. Other: None. IMPRESSION: 1. New small ill-defined hypodensity at the right cerebral peduncle may reflect acute infarct. Consider further evaluation with MRI. 2. Chronic infarcts in the right frontal lobe and left pons. Electronically Signed   By: Sherron Ales M.D.   On: 05/04/2022 16:32   DG Chest 2 View  Result Date: 05/04/2022 CLINICAL DATA:  Difficulty  swelling. Unable to lift left arm. EXAM: CHEST - 2 VIEW COMPARISON:  Two-view chest x-ray 01/21/2017 FINDINGS: Heart size normal. Atherosclerotic calcifications are present at the aortic arch. Small bilateral pleural effusions are present. Mild left greater than right basilar airspace opacities are noted. The upper lung fields are clear. Visualized soft tissues and bony thorax are otherwise unremarkable. IMPRESSION: 1. Small bilateral pleural effusions. 2. Mild left greater than right basilar airspace disease likely reflects atelectasis. Infection or aspiration is not excluded. 3. Aortic atherosclerosis. Electronically Signed   By: Marin Roberts M.D.   On: 05/04/2022 16:12    Microbiology: Results for orders placed or performed during the hospital encounter of 07/19/21  Resp Panel by RT-PCR (Flu A&B, Covid) Nasopharyngeal Swab     Status: None   Collection Time: 07/19/21  7:07 AM   Specimen: Nasopharyngeal Swab; Nasopharyngeal(NP) swabs in vial transport medium  Result Value Ref Range Status   SARS Coronavirus 2 by RT PCR NEGATIVE NEGATIVE Final    Comment: (NOTE) SARS-CoV-2 target nucleic acids are NOT DETECTED.  The SARS-CoV-2 RNA is generally detectable in upper respiratory specimens during the acute phase of infection. The lowest concentration of SARS-CoV-2 viral copies this assay can detect is 138 copies/mL. A negative result does not preclude SARS-Cov-2 infection and should not be used as the sole basis for treatment or other patient management decisions. A negative result may occur with  improper specimen collection/handling, submission of specimen other than nasopharyngeal swab, presence of viral mutation(s) within the areas targeted  by this assay, and inadequate number of viral copies(<138 copies/mL). A negative result must be combined with clinical observations, patient history, and epidemiological information. The expected result is Negative.  Fact Sheet for Patients:   EntrepreneurPulse.com.au  Fact Sheet for Healthcare Providers:  IncredibleEmployment.be  This test is no t yet approved or cleared by the [place] FDA and  has been authorized for detection and/or diagnosis of SARS-CoV-2 by FDA under an Emergency Use Authorization (EUA). This EUA will remain  in effect (meaning this test can be used) for the duration of the COVID-19 declaration under Section 564(b)(1) of the Act, 21 U.S.C.section 360bbb-3(b)(1), unless the authorization is terminated  or revoked sooner.       Influenza A by PCR NEGATIVE NEGATIVE Final   Influenza B by PCR NEGATIVE NEGATIVE Final    Comment: (NOTE) The Xpert Xpress SARS-CoV-2/FLU/RSV plus assay is intended as an aid in the diagnosis of influenza from Nasopharyngeal swab specimens and should not be used as a sole basis for treatment. Nasal washings and aspirates are unacceptable for Xpert Xpress SARS-CoV-2/FLU/RSV testing.  Fact Sheet for Patients: EntrepreneurPulse.com.au  Fact Sheet for Healthcare Providers: IncredibleEmployment.be  This test is not yet approved or cleared by the [place] FDA and has been authorized for detection and/or diagnosis of SARS-CoV-2 by FDA under an Emergency Use Authorization (EUA). This EUA will remain in effect (meaning this test can be used) for the duration of the COVID-19 declaration under Section 564(b)(1) of the Act, 21 U.S.C. section 360bbb-3(b)(1), unless the authorization is terminated or revoked.  Performed at [facility], [address] [place], [place] [address]     Labs: CBC: Recent Labs  Lab 05/04/22 1359 05/05/22 0328 05/06/22 0241 05/07/22 0622 05/08/22 0555 05/09/22 0533  WBC 11.3* 10.9* 11.4* 7.1 6.0 6.1  NEUTROABS 8.1*  --   --   --   --   --   HGB 10.9* 9.8* 9.1* 7.9* 8.0* 8.5*  HCT 33.7* 30.3* 27.9* 24.4* 24.4* 25.8*  MCV 90.3 88.9 90.0 89.4 89.4 89.0  PLT 250  231 245 244 258 [id number]   Basic Metabolic Panel: Recent Labs  Lab 05/04/22 1359 05/04/22 1421 05/06/22 0241 05/07/22 0622 05/07/22 1524 05/08/22 0555  NA 141  --  143 140 140 138  K 3.9  --  2.7* 2.7* 3.1* 3.7  CL 106  --  106 109 105 108  CO2 28  --  24 27 25 25   GLUCOSE 113*  --  80 80 121* 84  BUN 8  --  5* <5* <5* <5*  CREATININE 0.81  --  0.72 0.63 0.70 0.62  CALCIUM 8.9  --  8.4* 7.9* 8.0* 7.9*  MG  --  1.9 1.6*  --  2.4  --    Liver Function Tests: Recent Labs  Lab 05/04/22 1359  AST 29  ALT 13  ALKPHOS 107  BILITOT 0.6  PROT 7.7  ALBUMIN 2.4*   CBG: Recent Labs  Lab 05/05/22 1612  GLUCAP 76    Discharge time spent: greater than 30 minutes.  Signed: Patrecia Pour, MD [facility] 05/10/2022

## 2022-05-10 NOTE — Progress Notes (Addendum)
Per message received from weekday SW, Blenda Nicely, pt's dtr has accepted [facility]. [employer] auth remains pending at this time. MD updated.    UPDATE 1530: [employer] auth remains pending at this time.   Dellie Burns, MSW, LCSW [phone] (coverage)

## 2022-05-11 DIAGNOSIS — D649 Anemia, unspecified: Secondary | ICD-10-CM | POA: Diagnosis not present

## 2022-05-11 DIAGNOSIS — I2699 Other pulmonary embolism without acute cor pulmonale: Secondary | ICD-10-CM | POA: Diagnosis not present

## 2022-05-11 DIAGNOSIS — I639 Cerebral infarction, unspecified: Secondary | ICD-10-CM | POA: Diagnosis not present

## 2022-05-11 DIAGNOSIS — I63511 Cerebral infarction due to unspecified occlusion or stenosis of right middle cerebral artery: Secondary | ICD-10-CM | POA: Diagnosis not present

## 2022-05-11 NOTE — Progress Notes (Signed)
Progress Note  Patient: SINAYA MINOGUE YNW:[id number] DOB: 01-18-1934  DOA: 05/04/2022  DOS: 05/11/2022    Brief hospital course: 88 y.o. female with medical history significant of stroke on aspirin and Plavix left-sided hemiparesis, CABG, hypertension, chronic diastolic heart failure, and hypothyroidism who presents with concerns of dysphagia.   Patient was recently hospitalized at [facility] overnight about 2 days ago.  Unfortunately unable to see records.  She presented there with a headache and reportedly was admitted for hypokalemia.  Family believes a CT head scan was done and her headache also resolved at discharge.  The night following discharge she was doing well and able to swallow her pills.  Then this morning when she woke up daughter noticed she was drooling all over when she attempted to take her pills.  Then again when she tried eating breakfast prompting daughter to take her to the ED.  Patient denies any headache or blurry vision.  No chest pain, shortness of breath.  No nausea, vomiting. Daughter also notes that about 3 weeks ago she was prescribed potassium supplementation and had persistent dark-colored diarrhea.  This resolved intermittently in terms of both diarrhea and her stool returning to brown color, but again about 6 days ago there was another episode of dark stool.  She is on aspirin and Plavix for her CVA   In the ED, she was afebrile and normotensive on room air. Had mild leukocytosis of 11.3, hemoglobin 10.9 which appears to be downward trending 12.8 back in 06/2021.  No other significant electrolyte abnormalities.   EKG my review sinus rhythm and slight ST depression in V4 to V6 which is also seen on prior EKGs.     CT head with new small ill-defined hypodensity at the right cerebral peduncle. MRI brain revealing for acute right MCA distribution infarct involving the right insula and overlying the right frontal lobe.   CT chest with large bilateral central  pulmonary artery emboli extending into the lobar branch with no evidence of right heart strain and evidence of right heart strain.  Trace left pleural effusion with developing infarct not excluded.   ED PA discussed with neurology Dr. Otelia Limes and after extensive discussion with him and with patient and her family.  They have decided to initiate IV heparin for a PE in the setting of new stroke.  Hospitalist then consult for admission.  Assessment and Plan: Acute right MCA stroke: acute right MCA infarct involving insula and right frontal lobe, atrophy and chronic microvascular ischemic disease - On eliquis (PTA was DAPT) due to PE/DVT.  - LDL is 42, continue rosuvastatin 40mg  daily.  - PT/OT evaluations recommending SNF. I believe this will optimize her functional mobility, decrease caregiver burden when transitioning back home with her daughter, and drastically reduce her high risk of readmission. Unfortunately, insurance has denied SNF coverage at this time. We will pursue peer to peer in AM.   Possible acute blood loss anemia on anemia of chronic disease: Anemia panel consistent with chronic inflammation (TIBC low at 123, 65% saturation, ferritin of 491. Iron 80).   - H/H is stable x48 hours with no bleeding clinically. BUN low, not consistent with UGIB. Transitioned heparin to DOAC 8/4. - Has no objection to transfusion, which was discussed with her and family. Suggest recheck CBC within the next week.   Acute pulmonary embolism, acute right CFV DVT: CT chest with contrast (not CTA) with large bilateral central pulmonary artery emboli extending into the lobar branch with no evidence of  right heart strain and evidence of right heart strain. I have reviewed this personally and agree with radiologist interpretation. Also with acute right CFV DVT, likely provoked by bedbound status which is not likely to change.  - Continue anticoagulation, planning long term eliquis.    Constipation: Abd exam  benign. This symptom argues against GI bleeding. - Senna ordered, has had BM, LBM 8/5.   GERD: Chronic, stable - Continue PPI   Dysphagia: SLP evaluation most recently 8/4: "Pt believes that she has been doing better on her full liquid diet, also reporting less coughing during meals. SLP provided trials of thin liquids as well as advanced trials of purees for practice with oral control and clearance. She still has a significant amount of L anterior loss of purees with visibly incomplete labial closure, although L pocketing might be a little less in volume. Pt needs consistent cues to perform a second swallow but there is still coughing noted in between trials of puree. She has difficulty managing L oral deficits despite cues. She prefers to stay on full liquid diet for now." - Tolerating adequate hydration and nutrition at this time. Hoping to avoid need for PEG. Palliative consultation appreciated, they should follow at SNF.     Hypokalemia: Normalized with supplementation and restart of diet.    Hypothyroidism - Continue synthroid   Slurred speech due to CVA:  - SLP evaluation appreciated.      Chronic HFpEF, CAD: Stable. 3-vessel calcification noted on CT, followed by [facility]. No recent angina or CHF symptoms. - Continue medical therapy.   Essential hypertension: - Allowed permissive HTN, now will restart norvasc, due to new right MCA stroke   DNR: POA. Gold form signed.  Subjective: No overnight events of note. She had nonbloody BM yesterday.   Objective: Vitals:   05/11/22 0323 05/11/22 0902 05/11/22 1157 05/11/22 1552  BP: (!) 145/43 (!) 116/59 (!) 100/56 (!) 109/58  Pulse: (!) 58 69 73 71  Resp: 16 20  16   Temp: 98.2 F (36.8 C) 97.7 F (36.5 C) 98.7 F (37.1 C) 98.2 F (36.8 C)  TempSrc: Oral Oral Oral Oral  SpO2: 100% 97% 96% 99%  Weight:      Height:      Pleasant elderly female in no distress Clear, nonlabored RRR Soft, NT, ND Alert, oriented, stable  neurological deficits.   Family Communication: Daughter and brother at bedside this AM  Disposition: Status is: Inpatient Remains inpatient appropriate because: Unsafe discharge, seeking SNF placement. Planned Discharge Destination: Skilled nursing facility recommended, pt and family amenable. Denied by insurance.    , MD 05/11/2022 6:07 PM Page by [email or web address]

## 2022-05-11 NOTE — Progress Notes (Addendum)
Spoke to [facility] at [facility] who reports pt's case is now under review with their Wellsite geologist. SW will provide updates as available.    UPDATE 1050: call received from [facility] with [employer] who reports their medical director is offering a P2P which needs to be completed by Monday 8/7, 12pm EST-- [phone] option 5.   MD updated.   Dellie Burns, MSW, LCSW [phone] (coverage)

## 2022-05-12 DIAGNOSIS — I2699 Other pulmonary embolism without acute cor pulmonale: Secondary | ICD-10-CM | POA: Diagnosis not present

## 2022-05-12 DIAGNOSIS — I5032 Chronic diastolic (congestive) heart failure: Secondary | ICD-10-CM | POA: Diagnosis not present

## 2022-05-12 DIAGNOSIS — D649 Anemia, unspecified: Secondary | ICD-10-CM | POA: Diagnosis not present

## 2022-05-12 DIAGNOSIS — I63511 Cerebral infarction due to unspecified occlusion or stenosis of right middle cerebral artery: Secondary | ICD-10-CM | POA: Diagnosis not present

## 2022-05-12 NOTE — Discharge Summary (Signed)
Physician Discharge Summary   Patient: [name] [name] MRN: [id number] DOB: January 19, 1934  Admit date:     05/04/2022  Discharge date: 05/12/2022   Discharge Physician: Tyrone Nine   PCP: Oneal Grout, FNP   Recommendations at discharge:  Palliative care to follow with patient at SNF. Pt is DNR, though would be open to PEG if needed and desires full scope of recommended medical care and rehabilitation at this time.  Recommend follow up with neurology in 6-8 weeks.   Discharge Diagnoses: Principal Problem:   Acute right MCA stroke (HCC) Active Problems:   Essential hypertension   Chronic diastolic heart failure (HCC)   Slurred speech   Hypothyroidism   Acute pulmonary embolism (HCC)   Anemia   Acute CVA (cerebrovascular accident) [facility])  Hospital Course: Per H&P: 88 y.o. female with medical history significant of stroke on aspirin and Plavix left-sided hemiparesis, CABG, hypertension, chronic diastolic heart failure, and hypothyroidism who presents with concerns of dysphagia.   Patient was recently hospitalized at [facility] overnight about 2 days ago.  Unfortunately unable to see records.  She presented there with a headache and reportedly was admitted for hypokalemia.  Family believes a CT head scan was done and her headache also resolved at discharge.  The night following discharge she was doing well and able to swallow her pills.  Then this morning when she woke up daughter noticed she was drooling all over when she attempted to take her pills.  Then again when she tried eating breakfast prompting daughter to take her to the ED.  Patient denies any headache or blurry vision.  No chest pain, shortness of breath.  No nausea, vomiting. Daughter also notes that about 3 weeks ago she was prescribed potassium supplementation and had persistent dark-colored diarrhea.  This resolved intermittently in terms of both diarrhea and her stool returning to brown color, but again about 6 days ago  there was another episode of dark stool.  She is on aspirin and Plavix for her CVA   In the ED, she was afebrile and normotensive on room air. Had mild leukocytosis of 11.3, hemoglobin 10.9 which appears to be downward trending 12.8 back in 06/2021.  No other significant electrolyte abnormalities.   EKG my review sinus rhythm and slight ST depression in V4 to V6 which is also seen on prior EKGs.     CT head with new small ill-defined hypodensity at the right cerebral peduncle. MRI brain revealing for acute right MCA distribution infarct involving the right insula and overlying the right frontal lobe.   CT chest with large bilateral central pulmonary artery emboli extending into the lobar branch with no evidence of right heart strain and evidence of right heart strain.  Trace left pleural effusion with developing infarct not excluded.   ED PA discussed with neurology Dr. Otelia Limes and after extensive discussion with him and with patient and her family.  They have decided to initiate IV heparin for a PE in the setting of new stroke.  Hospitalist then consult for admission."  Hospital course: The patient tolerated anticoagulation with some downward trend of hemoglobin that has since stabilized in 8's with no evidence of bleeding throughout hospitalization. U/S additionally revealed acute DVT. No right heart strain. Due to anticoagulation, DAPT has been discontinued. The patient has worked with SLP and cleared for diet with aspiration precautions. She is tolerating enteral nutrition/hydration and oral medications. PT and OT have recommended SNF rehabilitation to improve on PLOF, which is  pursued per patient preference over returning home with home health. Please see details below.  Assessment and Plan: Acute right MCA stroke: acute right MCA infarct involving insula and right frontal lobe, atrophy and chronic microvascular ischemic disease - On eliquis (PTA was DAPT) due to PE/DVT.  - LDL is 42,  continue rosuvastatin 40mg  daily.  - PT/OT evaluations recommending SNF.     Possible acute blood loss anemia on anemia of chronic disease: Anemia panel consistent with chronic inflammation (TIBC low at 123, 65% saturation, ferritin of 491. Iron 80).   - H/H is stable x48 hours with no bleeding clinically. BUN low, not consistent with UGIB. Transitioned heparin to DOAC 8/4. - Has no objection to transfusion, which was discussed with her and family. Suggest recheck CBC within the next week.   Acute pulmonary embolism, acute right CFV DVT: CT chest with contrast (not CTA) with large bilateral central pulmonary artery emboli extending into the lobar branch with no evidence of right heart strain and evidence of right heart strain. I have reviewed this personally and agree with radiologist interpretation. Also with acute right CFV DVT, likely provoked by bedbound status which is not likely to change.  - Continue anticoagulation, planning long term eliquis.    Constipation: Abd exam benign. This symptom argues against GI bleeding. - Senna ordered, has had BM and exam continues to be benign.   GERD: Chronic, stable - Continue PPI   Dysphagia: SLP evaluation most recently 8/4: "Pt believes that she has been doing better on her full liquid diet, also reporting less coughing during meals. SLP provided trials of thin liquids as well as advanced trials of purees for practice with oral control and clearance. She still has a significant amount of L anterior loss of purees with visibly incomplete labial closure, although L pocketing might be a little less in volume. Pt needs consistent cues to perform a second swallow but there is still coughing noted in between trials of puree. She has difficulty managing L oral deficits despite cues. She prefers to stay on full liquid diet for now." - Tolerating adequate hydration and nutrition at this time. Hoping to avoid need for PEG. Palliative consultation appreciated, they  should follow at SNF.     Hypokalemia: Normalized with supplementation and restart of diet.    Hypothyroidism - Continue synthroid   Slurred speech due to CVA:  - SLP evaluation appreciated.      Chronic HFpEF, CAD: Stable. 3-vessel calcification noted on CT, followed by [facility]. No recent angina or CHF symptoms. - Continue medical therapy.   Essential hypertension: - Allowed permissive HTN, now will restart norvasc, due to new right MCA stroke  DNR: POA. Gold form signed.  Consultants: Neurology Procedures performed: None  Disposition: Skilled nursing facility Diet recommendation:  Full liquid diet DISCHARGE MEDICATION: Allergies as of 05/12/2022   No Known Allergies      Medication List     STOP taking these medications    aspirin EC 81 MG tablet   clopidogrel 75 MG tablet Commonly known as: PLAVIX       TAKE these medications    acetaminophen 500 MG tablet Commonly known as: TYLENOL Take 1,000 mg by mouth every 6 (six) hours as needed for mild pain.   amLODipine 10 MG tablet Commonly known as: NORVASC Take 1 tablet (10 mg total) by mouth daily.   apixaban 5 MG Tabs tablet Commonly known as: ELIQUIS Take 2 tablets (10 mg total) by mouth 2 (two)  times daily for 6 days, THEN 1 tablet (5 mg total) 2 (two) times daily. Start taking on: May 10, 2022   cyclobenzaprine 5 MG tablet Commonly known as: FLEXERIL Take 5 mg by mouth 2 (two) times daily as needed for muscle spasms.   diclofenac Sodium 1 % Gel Commonly known as: VOLTAREN Apply 2 g topically daily as needed for pain.   esomeprazole 40 MG capsule Commonly known as: NEXIUM Take 40 mg by mouth daily.   fluticasone 50 MCG/ACT nasal spray Commonly known as: FLONASE Place 1 spray into both nostrils daily. What changed:  when to take this reasons to take this   gabapentin 100 MG capsule Commonly known as: NEURONTIN Take 100 mg by mouth 2 (two) times daily.   levothyroxine 75 MCG  tablet Commonly known as: SYNTHROID Take 75 mcg by mouth daily.   nitroGLYCERIN 0.4 MG SL tablet Commonly known as: NITROSTAT Place 1 tablet (0.4 mg total) under the tongue every 5 (five) minutes as needed for chest pain.   potassium chloride 20 MEQ packet Commonly known as: KLOR-CON Take 20 mEq by mouth daily.   rosuvastatin 40 MG tablet Commonly known as: CRESTOR TAKE 1 TABLET BY MOUTH ONCE DAILY.   senna-docusate 8.6-50 MG tablet Commonly known as: Senokot-S Take 1 tablet by mouth daily.        Contact information for follow-up providers     Oneal Grout, FNP Follow up.   Specialty: Family Medicine Contact information: [address] [place] [address] [phone]              Contact information for after-discharge care     Destination     [facility] .   Service: Skilled Nursing Contact information: [address] [place] [address] [phone]                    Discharge Exam: Ceasar Mons Weights   05/04/22 1505  Weight: 66.7 kg  Pleasant elderly female in no distress Clear, nonlabored RRR, no edema Soft, NT, ND, +BS Alert, oriented, dysarthria and left facial droop stable. 5/5 strength to RUE and RLE, 0/5 to LUE, 1/5 to LLE  Condition at discharge: stable  The results of significant diagnostics from this hospitalization (including imaging, microbiology, ancillary and laboratory) are listed below for reference.   Imaging Studies: VAS [place] UPPER EXTREMITY VENOUS DUPLEX  Result Date: 05/08/2022 UPPER VENOUS STUDY  Patient Name:  [name] [name]  Date of Exam:   05/08/2022 Medical Rec #: [id number]       Accession #:    [id number] Date of Birth: [birth year]        Patient Gender: F Patient Age:   76 years Exam Location:  [facility] Procedure:      VAS [place] UPPER EXTREMITY VENOUS DUPLEX Referring Phys: Hazeline Junker  --------------------------------------------------------------------------------  Indications: Edema Comparison Study: No previous exam noted. Performing Technologist: Magdalene River BS, RVT  Examination Guidelines: A complete evaluation includes B-mode imaging, spectral Doppler, color Doppler, and power Doppler as needed of all accessible portions of each vessel. Bilateral testing is considered an integral part of a complete examination. Limited examinations for reoccurring indications may be performed as noted.  Right Findings: +----------+------------+---------+-----------+----------+-------+ RIGHT     CompressiblePhasicitySpontaneousPropertiesSummary +----------+------------+---------+-----------+----------+-------+ Subclavian    Full       Yes       Yes                      +----------+------------+---------+-----------+----------+-------+  Left Findings: +----------+------------+---------+-----------+----------+-------+ LEFT      CompressiblePhasicitySpontaneousPropertiesSummary +----------+------------+---------+-----------+----------+-------+ IJV           Full       Yes       Yes                      +----------+------------+---------+-----------+----------+-------+ Subclavian    Full       Yes       Yes                      +----------+------------+---------+-----------+----------+-------+ Axillary      Full       Yes       Yes                      +----------+------------+---------+-----------+----------+-------+ Brachial      Full                                          +----------+------------+---------+-----------+----------+-------+ Radial        Full                                          +----------+------------+---------+-----------+----------+-------+ Ulnar         Full                                          +----------+------------+---------+-----------+----------+-------+ Cephalic      Full                                           +----------+------------+---------+-----------+----------+-------+ Basilic       Full                                          +----------+------------+---------+-----------+----------+-------+  Summary:  Right: No evidence of thrombosis in the subclavian.  Left: No evidence of deep vein thrombosis in the upper extremity. No evidence of superficial vein thrombosis in the upper extremity.  *See table(s) above for measurements and observations.  Diagnosing physician: Heath Lark Electronically signed by Heath Lark on 05/08/2022 at 4:35:25 PM.    Final    VAS [place] LOWER EXTREMITY VENOUS (DVT)  Result Date: 05/08/2022  Lower Venous DVT Study Patient Name:  [name] [name] [facility]  Date of Exam:   05/08/2022 Medical Rec #: [id number]       Accession #:    [id number] Date of Birth: September 13, 1934        Patient Gender: F Patient Age:   89 years Exam Location:  [facility] Procedure:      VAS [place] LOWER EXTREMITY VENOUS (DVT) Referring Phys: Hazeline Junker --------------------------------------------------------------------------------  Indications: Edema.  Comparison Study: No previous exam noted. Performing Technologist: Magdalene River BS, RVT  Examination Guidelines: A complete evaluation includes B-mode imaging, spectral Doppler, color Doppler, and power Doppler as needed of all accessible portions of each vessel. Bilateral testing is considered an integral part of a complete examination. Limited examinations for  reoccurring indications may be performed as noted. The reflux portion of the exam is performed with the patient in reverse Trendelenburg.  +---------+---------------+---------+-----------+----------+--------------+ RIGHT    CompressibilityPhasicitySpontaneityPropertiesThrombus Aging +---------+---------------+---------+-----------+----------+--------------+ CFV      Partial        Yes      Yes                                  +---------+---------------+---------+-----------+----------+--------------+ SFJ      Full                                                        +---------+---------------+---------+-----------+----------+--------------+ FV Prox  Full                                                        +---------+---------------+---------+-----------+----------+--------------+ FV Mid   Full                                                        +---------+---------------+---------+-----------+----------+--------------+ FV DistalFull                                                        +---------+---------------+---------+-----------+----------+--------------+ PFV      Full                                                        +---------+---------------+---------+-----------+----------+--------------+ POP      Full           Yes      Yes                                 +---------+---------------+---------+-----------+----------+--------------+ PTV      Full                                                        +---------+---------------+---------+-----------+----------+--------------+ PERO     Full                                                        +---------+---------------+---------+-----------+----------+--------------+   +---------+---------------+---------+-----------+----------+--------------+ LEFT     CompressibilityPhasicitySpontaneityPropertiesThrombus Aging +---------+---------------+---------+-----------+----------+--------------+ CFV      Full  Yes      Yes                                 +---------+---------------+---------+-----------+----------+--------------+ SFJ      Full                                                        +---------+---------------+---------+-----------+----------+--------------+ FV Prox  Full                                                         +---------+---------------+---------+-----------+----------+--------------+ FV Mid   Full                                                        +---------+---------------+---------+-----------+----------+--------------+ FV DistalFull                                                        +---------+---------------+---------+-----------+----------+--------------+ PFV      Full                                                        +---------+---------------+---------+-----------+----------+--------------+ POP      Full           Yes      Yes                                 +---------+---------------+---------+-----------+----------+--------------+ PTV      Full                                                        +---------+---------------+---------+-----------+----------+--------------+ PERO     Full                                                        +---------+---------------+---------+-----------+----------+--------------+     Summary: BILATERAL: -No evidence of popliteal cyst, bilaterally. RIGHT: - Findings consistent with acute deep vein thrombosis involving the right common femoral vein. - Non-occlusive acute thrombus at the distal common femoral vein.  LEFT: - No evidence of deep vein thrombosis in the lower extremity. No indirect evidence of obstruction proximal to the inguinal ligament.  *See table(s) above for measurements and observations. Electronically signed by Maisie Fus  Hawken on 05/08/2022 at 4:33:49 PM.    Final    ECHOCARDIOGRAM COMPLETE  Result Date: 05/05/2022    ECHOCARDIOGRAM REPORT   Patient Name:   [name] [name] [facility] Date of Exam: 05/05/2022 Medical Rec #:  [id number]      Height:       61.0 in Accession #:    [id number]     Weight:       147.0 lb Date of Birth:  09-Jan-1934       BSA:          1.657 m Patient Age:    88 years       BP:           132/61 mmHg Patient Gender: F              HR:           64 bpm. Exam Location:  Inpatient Procedure: 2D  Echo, Cardiac Doppler, Color Doppler and Saline Contrast Bubble            Study Indications:    Stroke  History:        Patient has prior history of Echocardiogram examinations, most                 recent 06/17/2021. CHF, Prior CABG; Risk Factors:Hypertension.  Sonographer:    Ross Ludwig RDCS (AE) Referring Phys: [id number] CHING T TU IMPRESSIONS  1. Left ventricular ejection fraction, by estimation, is 60 to 65%. The left ventricle has normal function. Left ventricular endocardial border not optimally defined to evaluate regional wall motion. Left ventricular diastolic parameters are consistent with Grade I diastolic dysfunction (impaired relaxation).  2. Right ventricular systolic function is normal. The right ventricular size is normal. There is normal pulmonary artery systolic pressure.  3. The mitral valve is grossly normal. Mild mitral valve regurgitation.  4. The aortic valve was not well visualized. Aortic valve regurgitation is not visualized.  5. The inferior vena cava is normal in size with greater than 50% respiratory variability, suggesting right atrial pressure of 3 mmHg. Comparison(s): Compared to prior TTE 06/2021, there is no significant change. Conclusion(s)/Recommendation(s): No intracardiac source of embolism detected on this transthoracic study. Consider a transesophageal echocardiogram to exclude cardiac source of embolism if clinically indicated. FINDINGS  Left Ventricle: Left ventricular ejection fraction, by estimation, is 60 to 65%. The left ventricle has normal function. Left ventricular endocardial border not optimally defined to evaluate regional wall motion. The left ventricular internal cavity size was normal in size. There is no left ventricular hypertrophy. Left ventricular diastolic parameters are consistent with Grade I diastolic dysfunction (impaired relaxation). Right Ventricle: The right ventricular size is normal. No increase in right ventricular wall thickness. Right ventricular  systolic function is normal. There is normal pulmonary artery systolic pressure. The tricuspid regurgitant velocity is 2.59 m/s, and  with an assumed right atrial pressure of 8 mmHg, the estimated right ventricular systolic pressure is 34.8 mmHg. Left Atrium: Left atrial size was normal in size. Right Atrium: Right atrial size was normal in size. Pericardium: There is no evidence of pericardial effusion. Mitral Valve: The mitral valve is grossly normal. There is mild thickening of the mitral valve leaflet(s). There is mild calcification of the mitral valve leaflet(s). Mild mitral valve regurgitation. Tricuspid Valve: The tricuspid valve is normal in structure. Tricuspid valve regurgitation is trivial. Aortic Valve: The aortic valve was not well visualized. Aortic valve regurgitation is not visualized. Aortic valve mean gradient measures 3.0 mmHg.  Aortic valve peak gradient measures 6.7 mmHg. Aortic valve area, by VTI measures 2.67 cm. Pulmonic Valve: The pulmonic valve was not well visualized. Aorta: The aortic root is normal in size and structure. Venous: The inferior vena cava is normal in size with greater than 50% respiratory variability, suggesting right atrial pressure of 3 mmHg. IAS/Shunts: The atrial septum is grossly normal. Agitated saline contrast was given intravenously to evaluate for intracardiac shunting.  LEFT VENTRICLE PLAX 2D LVIDd:         4.00 cm   Diastology LVIDs:         2.60 cm   LV e' medial:    5.11 cm/s LV PW:         0.70 cm   LV E/e' medial:  14.1 LV IVS:        0.60 cm   LV e' lateral:   6.53 cm/s LVOT diam:     1.90 cm   LV E/e' lateral: 11.1 LV SV:         69 LV SV Index:   42 LVOT Area:     2.84 cm  IVC IVC diam: 0.90 cm LEFT ATRIUM           Index        RIGHT ATRIUM           Index LA diam:      2.70 cm 1.63 cm/m   RA Area:     12.10 cm LA Vol (A4C): 23.9 ml 14.42 ml/m  RA Volume:   28.60 ml  17.26 ml/m  AORTIC VALVE AV Area (Vmax):    2.66 cm AV Area (Vmean):   2.77 cm AV  Area (VTI):     2.67 cm AV Vmax:           129.00 cm/s AV Vmean:          74.900 cm/s AV VTI:            0.258 m AV Peak Grad:      6.7 mmHg AV Mean Grad:      3.0 mmHg LVOT Vmax:         121.00 cm/s LVOT Vmean:        73.100 cm/s LVOT VTI:          0.243 m LVOT/AV VTI ratio: 0.94  AORTA Ao Root diam: 2.60 cm MITRAL VALVE                TRICUSPID VALVE MV Area (PHT): 2.42 cm     TR Peak grad:   26.8 mmHg MV Decel Time: 313 msec     TR Vmax:        259.00 cm/s MV E velocity: 72.20 cm/s MV A velocity: 126.00 cm/s  SHUNTS MV E/A ratio:  0.57         Systemic VTI:  0.24 m                             Systemic Diam: 1.90 cm Laurance FlattenHeather Pemberton MD Electronically signed by Laurance FlattenHeather Pemberton MD Signature Date/Time: 05/05/2022/3:38:39 PM    Final    DG Swallowing Func-Speech Pathology  Result Date: 05/05/2022 Table formatting from the original result was not included. Objective Swallowing Evaluation: Type of Study: MBS-Modified Barium Swallow Study  Patient Details Name: [name] [name] MRN: [id number] Date of Birth: 01/14/1934 Today's Date: 05/05/2022 Time: SLP Start Time (ACUTE ONLY): 1330 -SLP Stop Time (ACUTE ONLY): 1346 SLP  Time Calculation (min) (ACUTE ONLY): 16 min Past Medical History: Past Medical History: Diagnosis Date  Ankle edema   Arthritis   right hip  Bronchitis   in Dec  CAD (coronary artery disease)   a. s/p DES to LAD in 2008, NST in 2015 and 2017 which were low-risk  Carotid stenosis   mild  Cataract   left;not ready to come off  Constipation   takes OTC stool softener as needed  Diabetes mellitus   pt doesn't take any meds for it  DJD (degenerative joint disease) of hip   right  Dry cough   GERD (gastroesophageal reflux disease)   Headache(784.0)   occasionally  HTN (hypertension)   takes Amlodpine and Carvedilol daily  Hx of cardiovascular stress test   Lexiscan Myoview (04/2014):  No ischemia, EF 64%, Low Risk  Hypercholesteremia   takes Crestor daily  Hypothyroidism   takes Synthroid daily  Ileus,  postoperative (HCC) 05/12/2014  Myocardial infarction [facility])   pt unsure but thinks about 14yrs ago;is taking Plavix daily   Nocturia   Osteoarthritis of left hip 05/09/2014  Primary osteoarthritis of right hip 10/27/2011 Past Surgical History: Past Surgical History: Procedure Laterality Date  ABDOMINAL HYSTERECTOMY  20+yrs  BACK SURGERY  15+yrs ago  CARDIAC CATHETERIZATION  2008  CORONARY ARTERY BYPASS GRAFT    LYMPH NODE DISSECTION  20+yrs ago  right   TOTAL HIP ARTHROPLASTY  10/27/2011  Procedure: TOTAL HIP ARTHROPLASTY;  Surgeon: Eulas Post, MD;  Location: [facility] OR;  Service: Orthopedics;  Laterality: Right;  TOTAL HIP ARTHROPLASTY Left 05/09/2014  Procedure: LEFT TOTAL HIP ARTHROPLASTY;  Surgeon: Eulas Post, MD;  Location: [facility] OR;  Service: Orthopedics;  Laterality: Left; HPI: Pt is an 88 yo female presenting with concerns of dysphagia. She was recently admitted to [facility] for hypokalemia but the morning after being discharged, she was found to be drooling and unable to take her pills or swallow her breakfast. MRI showed acute R MCA infarct involving the R insula nd overlying R frontal lobe. CT Chest also revealed large PE. PMH includes: stroke with L-sided hemiparesis, h/o feeding tube (removed earlier this year), CABG, HTN, CHF, hypothyroidism  Subjective: pt says she couldn't swallow her oatmeal at home, thinks she's feeling a little better here  Recommendations for follow up therapy are one component of a multi-disciplinary discharge planning process, led by the attending physician.  Recommendations may be updated based on patient status, additional functional criteria and insurance authorization. Assessment / Plan / Recommendation   05/05/2022   1:00 PM Clinical Impressions Clinical Impression Pt presents with an oral more than pharyngeal dysphagia. She has incomplete L labial seal that results in significant bolus loss with thin liquids via cup, although with improved closure noted around a straw.  Less anterior loss was noted with purees compared to clinical assessment earlier today, but oral residue noted with all consistencies is the most significant with purees. She requires cues to manage oral residue, saying that she does not feel it in her mouth. SLP provided visual feedback as well, allowing pt to watch MBS video in real time to try to facilitate oral clearance. A cued second swallow is generally effective but takes a little extra time to initiate. Pharyngeally she has reduced hyolaryngea movement and pharyngeal squeeze. Visibility throughout study somewhat limited by pt positioning with shadow from shoulder as well as residue from pyriform sinuses superimposed over laryngeal vestibule, although she does appear to have moments of trace penetration from  thin liquids during the swallow. Pt also had mild residue at the pyriform sinuses with incomplete entrance into the UES as well as occasional backflow. Retrograde flow also noted during brief esophageal sweep (although could not scan the entire esophagus). Recommend starting Dys 1 diet and thin liquids with full supervision. Would use two swallows per bolus and take one small sip at a time. SLP Visit Diagnosis Dysphagia, unspecified (R13.10) Impact on safety and function Mild aspiration risk;Moderate aspiration risk;Risk for inadequate nutrition/hydration     05/05/2022   1:00 PM Treatment Recommendations Treatment Recommendations Therapy as outlined in treatment plan below     05/05/2022   1:00 PM Prognosis Prognosis for Safe Diet Advancement Good Barriers to Reach Goals Cognitive deficits   05/05/2022   1:00 PM Diet Recommendations SLP Diet Recommendations Dysphagia 1 (Puree) solids;Thin liquid Liquid Administration via Straw Medication Administration Crushed with puree Compensations Minimize environmental distractions;Slow rate;Small sips/bites;Multiple dry swallows after each bite/sip Postural Changes Seated upright at 90 degrees;Remain semi-upright  after after feeds/meals (Comment)     05/05/2022   1:00 PM Other Recommendations Oral Care Recommendations Oral care BID Follow Up Recommendations Acute inpatient rehab (3hours/day) Assistance recommended at discharge Frequent or constant Supervision/Assistance Functional Status Assessment Patient has had a recent decline in their functional status and demonstrates the ability to make significant improvements in function in a reasonable and predictable amount of time.   05/05/2022   1:00 PM Frequency and Duration  Speech Therapy Frequency (ACUTE ONLY) min 2x/week Treatment Duration 2 weeks     05/05/2022   1:00 PM Oral Phase Oral Phase Impaired Oral - Nectar Straw Left anterior bolus loss;Reduced posterior propulsion;Lingual/palatal residue Oral - Thin Cup Left anterior bolus loss;Reduced posterior propulsion;Lingual/palatal residue Oral - Thin Straw Left anterior bolus loss;Reduced posterior propulsion;Lingual/palatal residue Oral - Puree Left anterior bolus loss;Reduced posterior propulsion;Lingual/palatal residue    05/05/2022   1:00 PM Pharyngeal Phase Pharyngeal Phase Impaired Pharyngeal- Thin Cup Reduced anterior laryngeal mobility;Reduced laryngeal elevation;Reduced airway/laryngeal closure;Pharyngeal residue - pyriform Pharyngeal- Thin Straw Reduced anterior laryngeal mobility;Reduced laryngeal elevation;Reduced airway/laryngeal closure;Pharyngeal residue - pyriform;Penetration/Aspiration during swallow Pharyngeal Material enters airway, remains ABOVE vocal cords and not ejected out Pharyngeal- Puree Reduced anterior laryngeal mobility;Reduced laryngeal elevation;Reduced airway/laryngeal closure;Pharyngeal residue - pyriform    05/05/2022   1:00 PM Cervical Esophageal Phase  Cervical Esophageal Phase Impaired Nectar Straw Reduced cricopharyngeal relaxation Thin Cup Reduced cricopharyngeal relaxation Thin Straw Reduced cricopharyngeal relaxation Puree Reduced cricopharyngeal relaxation Mahala Menghini., M.A. CCC-SLP Acute  Rehabilitation Services Office [phone] Secure chat preferred 05/05/2022, 3:06 PM                     EEG adult  Result Date: 05/05/2022 Charlsie Quest, MD     05/05/2022 12:45 PM Patient Name: [name] [name] MRN: [id number] Epilepsy Attending: Charlsie Quest Referring Physician/Provider: Caryl Pina, MD Date: 05/05/2022 Duration: 22.07 mins Patient history: 88 year old female with right frontal infarct.  EEG to evaluate for stroke. Level of alertness: Awake, drowsy AEDs during EEG study: None Technical aspects: This EEG study was done with scalp electrodes positioned according to the 10-20 International system of electrode placement. Electrical activity was reviewed with band pass filter of 1-70Hz , sensitivity of 7 uV/mm, display speed of 10mm/sec with a 60Hz  notched filter applied as appropriate. EEG data were recorded continuously and digitally stored.  Video monitoring was available and reviewed as appropriate. Description: The posterior dominant rhythm consists of 8-9 Hz activity of moderate voltage (25-35 uV) seen predominantly  in posterior head regions, symmetric and reactive to eye opening and eye closing. Drowsiness was characterized by attenuation of the posterior background rhythm.  EEG showed intermittent 3 to 6 Hz theta-delta slowing in right temporal region.  Hyperventilation and photic stimulation were not performed.   ABNORMALITY - Intermittent slow, right temporal region IMPRESSION: This study is suggestive of cortical dysfunction in right temporal region likely secondary to underlying stroke.  No seizures or epileptiform discharges were seen throughout the recording. Charlsie Quest   CT ANGIO HEAD NECK W WO CM  Result Date: 05/05/2022 CLINICAL DATA:  Stroke follow-up EXAM: CT ANGIOGRAPHY HEAD AND NECK TECHNIQUE: Multidetector CT imaging of the head and neck was performed using the standard protocol during bolus administration of intravenous contrast. Multiplanar CT image  reconstructions and MIPs were obtained to evaluate the vascular anatomy. Carotid stenosis measurements (when applicable) are obtained utilizing NASCET criteria, using the distal internal carotid diameter as the denominator. RADIATION DOSE REDUCTION: This exam was performed according to the departmental dose-optimization program which includes automated exposure control, adjustment of the mA and/or kV according to patient size and/or use of iterative reconstruction technique. CONTRAST:  10mL OMNIPAQUE IOHEXOL 350 MG/ML SOLN COMPARISON:  Head CT and brain MRI from yesterday FINDINGS: CT HEAD FINDINGS Brain: Known cytotoxic edema in the right frontal cortex from acute infarct. Small remote anterior right frontal and left frontal parietal infarcts. Chronic small vessel ischemia. Chronic lacunar infarcts in the left pons and right caudate. No acute hemorrhage, hydrocephalus, or collection. Vascular: See below Skull: Normal. Negative for fracture or focal lesion. Sinuses/Orbits: No acute finding. Review of the MIP images confirms the above findings CTA NECK FINDINGS Aortic arch: Partial coverage of the arch shows atherosclerosis without aneurysm. Right carotid system: Mild for age atheromatous plaque at the bifurcation without flow reducing stenosis or ulceration. Left carotid system: Moderate mainly calcified plaque at the bifurcation without flow reducing stenosis. Mixed density plaque at the common carotid. Negative for ulceration or beading Vertebral arteries: No proximal subclavian stenosis or irregularity. The right vertebral artery is dominant. The vertebral arteries are widely patent to the level of the dura. Skeleton: Generalized cervical spine degeneration. No acute or aggressive finding. Other neck: 9 mm right thyroid nodule. No followup recommended (ref: J Am Coll Radiol. 2015 Feb;12(2): 143-50). Upper chest: No acute finding. Review of the MIP images confirms the above findings CTA HEAD FINDINGS Anterior  circulation: Confluent atheromatous plaque on the carotid siphons. Atheromatous irregularity of medium size branches which is also generalized. No major branch occlusion or correctable flow limiting stenosis. The affected right MCA branch may be recanalized. Negative for aneurysm Posterior circulation: Dominant right vertebral artery showing calcified plaque. The vertebral and basilar arteries are diffusely patent. Atheromatous irregularity of the posterior cerebral arteries including at least moderate right PCA stenosis before the bifurcation. High-grade narrowing of the left PCA at its bifurcation. Venous sinuses: Patent Anatomic variants: None significant Review of the MIP images confirms the above findings IMPRESSION: Cervical and intracranial atherosclerosis as described. No flow limiting stenosis or ulceration underlying the acute right MCA territory infarct. Electronically Signed   By: Tiburcio Pea M.D.   On: 05/05/2022 05:01   MR BRAIN WO CONTRAST  Result Date: 05/04/2022 CLINICAL DATA:  Initial evaluation for neuro deficit, stroke suspected. EXAM: MRI HEAD WITHOUT CONTRAST TECHNIQUE: Multiplanar, multiecho pulse sequences of the brain and surrounding structures were obtained without intravenous contrast. COMPARISON:  Prior CT from earlier the same day. FINDINGS: Brain: Generalized age-related cerebral  atrophy. Patchy and confluent T2/FLAIR hyperintensity involving the periventricular and deep white matter both cerebral hemispheres, most consistent with chronic small vessel ischemic disease, moderate to advanced in nature. Patchy involvement of the pons noted. Multiple scattered superimposed remote lacunar infarcts present about the hemispheric cerebral white matter, thalamocapsular regions, and pons. Remote right frontal cortical infarct noted. Few scattered foci of chronic hemosiderin staining noted about these infarcts. Evidence for wallerian degeneration at the right cerebral peduncle. Restricted  diffusion involving the right insula and overlying right frontal lobe, consistent with acute right MCA distribution infarct (series 2, image 29). No associated hemorrhage or mass effect. No other evidence for acute or subacute ischemia. Gray-white matter differentiation otherwise maintained. No acute intracranial hemorrhage. No mass lesion, midline shift or mass effect. Ventricular prominence related global parenchymal volume loss without hydrocephalus. No extra-axial fluid collection. Empty sella noted. Vascular: Major intracranial vascular flow voids are maintained. Skull and upper cervical spine: Craniocervical junction within normal limits. Moderate spondylosis noted at C5-6. Bone marrow signal intensity normal. No scalp soft tissue abnormality. Sinuses/Orbits: Globes and orbital soft tissues within normal limits. Mild scattered mucosal thickening noted about the ethmoidal air cells and maxillary sinuses. Trace right mastoid effusion, of doubtful significance. Other: None. IMPRESSION: 1. Acute right MCA distribution infarct involving the right insula and overlying right frontal lobe. No associated hemorrhage or mass effect. 2. Underlying age-related cerebral atrophy with moderate to advanced chronic microvascular ischemic disease, with multiple remote infarcts as above. Electronically Signed   By: Rise Mu M.D.   On: 05/04/2022 21:48   CT Chest W Contrast  Result Date: 05/04/2022 CLINICAL DATA:  Respiratory illness. EXAM: CT CHEST WITH CONTRAST TECHNIQUE: Multidetector CT imaging of the chest was performed during intravenous contrast administration. RADIATION DOSE REDUCTION: This exam was performed according to the departmental dose-optimization program which includes automated exposure control, adjustment of the mA and/or kV according to patient size and/or use of iterative reconstruction technique. CONTRAST:  75mL OMNIPAQUE IOHEXOL 300 MG/ML  SOLN COMPARISON:  Chest radiograph dated 05/04/2022.  FINDINGS: Cardiovascular: There is no cardiomegaly or pericardial effusion. Advanced 3 vessel coronary vascular calcification. Mild atherosclerotic calcification of the thoracic aorta. No aneurysmal dilatation. The origins of the great vessels of the aortic arch appear patent. Evaluation of the pulmonary arteries is limited there is a timing of the contrast and suboptimal opacification. However, a large bilateral central pulmonary artery emboli extending into the lobar branches noted. No evidence of right heart straining. Mediastinum/Nodes: No hilar or mediastinal adenopathy. The esophagus is grossly unremarkable. There is a 13 mm right thyroid hypodense nodule. In the setting of significant comorbidities or limited life expectancy, no follow-up recommended (ref: J Am Coll Radiol. 2015 Feb;12(2): 143-50).No mediastinal fluid collection. Lungs/Pleura: Trace left pleural effusion. There are bibasilar dependent atelectasis. A somewhat wedge shaped pleural based ground-glass area at the left lung base may represent atelectasis. Developing infarct is not excluded. There is no pneumothorax. The central airways are patent. Upper Abdomen: Indeterminate 2 x 2 cm enhancing area in the posterior right lobe of the liver, not characterized on this CT but may represent a hemangioma or vascular shunting. This can be better evaluated with MRI or ultrasound on a nonemergent/outpatient basis. Partially visualized gallbladder with possible small pericholecystic fluid or small stone versus calcification. Right upper quadrant ultrasound may provide better evaluation. Small bilateral renal hypodense lesions are not characterized. Indeterminate 2.5 x 1.2 cm fluid attenuating structure in the left upper abdomen (123/3). Evaluation of the upper abdominal structures  is very limited due to respiratory motion and partial visualization. Musculoskeletal: Osteopenia with degenerative changes of spine. No acute osseous pathology. IMPRESSION: 1.  Large bilateral central pulmonary artery emboli extending into the lobar branches. No evidence of right heart straining. 2. Trace left pleural effusion with bibasilar dependent atelectasis. Developing infarct is not excluded. 3. Other nonemergent findings as above. 4. Aortic Atherosclerosis (ICD10-I70.0). These results were called by telephone at the time of interpretation on 05/04/2022 at 8:00 pm to provider [facility] , who verbally acknowledged these results. Electronically Signed   By: Elgie Collard M.D.   On: 05/04/2022 20:01   CT HEAD WO CONTRAST  Result Date: 05/04/2022 CLINICAL DATA:  Patient recently discharged after being hospitalized for stroke. New difficulty swallowing this morning. EXAM: CT HEAD WITHOUT CONTRAST TECHNIQUE: Contiguous axial images were obtained from the base of the skull through the vertex without intravenous contrast. RADIATION DOSE REDUCTION: This exam was performed according to the departmental dose-optimization program which includes automated exposure control, adjustment of the mA and/or kV according to patient size and/or use of iterative reconstruction technique. COMPARISON:  CT head 07/19/2021. FINDINGS: Brain: New small ill-defined hypodensity at the right cerebral peduncle (series 3, image 15). No evidence of acute hemorrhage, hydrocephalus, extra-axial collection or mass lesion/mass effect. Chronic infarcts noted in the anterior and mid right frontal lobe as well as the left aspect of the pons. Vascular: Vascular calcifications at the skull base. No hyperdense vessel or unexpected calcification. Skull: Normal. Negative for fracture or focal lesion. Sinuses/Orbits: No acute finding. Trace mucosal thickening in the left maxillary sinus. Hypoplastic left frontal sinus. Other: None. IMPRESSION: 1. New small ill-defined hypodensity at the right cerebral peduncle may reflect acute infarct. Consider further evaluation with MRI. 2. Chronic infarcts in the right frontal  lobe and left pons. Electronically Signed   By: Sherron Ales M.D.   On: 05/04/2022 16:32   DG Chest 2 View  Result Date: 05/04/2022 CLINICAL DATA:  Difficulty swelling. Unable to lift left arm. EXAM: CHEST - 2 VIEW COMPARISON:  Two-view chest x-ray 01/21/2017 FINDINGS: Heart size normal. Atherosclerotic calcifications are present at the aortic arch. Small bilateral pleural effusions are present. Mild left greater than right basilar airspace opacities are noted. The upper lung fields are clear. Visualized soft tissues and bony thorax are otherwise unremarkable. IMPRESSION: 1. Small bilateral pleural effusions. 2. Mild left greater than right basilar airspace disease likely reflects atelectasis. Infection or aspiration is not excluded. 3. Aortic atherosclerosis. Electronically Signed   By: Marin Roberts M.D.   On: 05/04/2022 16:12    Microbiology: Results for orders placed or performed during the hospital encounter of 07/19/21  Resp Panel by RT-PCR (Flu A&B, Covid) Nasopharyngeal Swab     Status: None   Collection Time: 07/19/21  7:07 AM   Specimen: Nasopharyngeal Swab; Nasopharyngeal(NP) swabs in vial transport medium  Result Value Ref Range Status   SARS Coronavirus 2 by RT PCR NEGATIVE NEGATIVE Final    Comment: (NOTE) SARS-CoV-2 target nucleic acids are NOT DETECTED.  The SARS-CoV-2 RNA is generally detectable in upper respiratory specimens during the acute phase of infection. The lowest concentration of SARS-CoV-2 viral copies this assay can detect is 138 copies/mL. A negative result does not preclude SARS-Cov-2 infection and should not be used as the sole basis for treatment or other patient management decisions. A negative result may occur with  improper specimen collection/handling, submission of specimen other than nasopharyngeal swab, presence of viral mutation(s) within the areas targeted  by this assay, and inadequate number of viral copies(<138 copies/mL). A negative result  must be combined with clinical observations, patient history, and epidemiological information. The expected result is Negative.  Fact Sheet for Patients:  BloggerCourse.com  Fact Sheet for Healthcare Providers:  SeriousBroker.it  This test is no t yet approved or cleared by the [place] FDA and  has been authorized for detection and/or diagnosis of SARS-CoV-2 by FDA under an Emergency Use Authorization (EUA). This EUA will remain  in effect (meaning this test can be used) for the duration of the COVID-19 declaration under Section 564(b)(1) of the Act, 21 U.S.C.section 360bbb-3(b)(1), unless the authorization is terminated  or revoked sooner.       Influenza A by PCR NEGATIVE NEGATIVE Final   Influenza B by PCR NEGATIVE NEGATIVE Final    Comment: (NOTE) The Xpert Xpress SARS-CoV-2/FLU/RSV plus assay is intended as an aid in the diagnosis of influenza from Nasopharyngeal swab specimens and should not be used as a sole basis for treatment. Nasal washings and aspirates are unacceptable for Xpert Xpress SARS-CoV-2/FLU/RSV testing.  Fact Sheet for Patients: BloggerCourse.com  Fact Sheet for Healthcare Providers: SeriousBroker.it  This test is not yet approved or cleared by the [place] FDA and has been authorized for detection and/or diagnosis of SARS-CoV-2 by FDA under an Emergency Use Authorization (EUA). This EUA will remain in effect (meaning this test can be used) for the duration of the COVID-19 declaration under Section 564(b)(1) of the Act, 21 U.S.C. section 360bbb-3(b)(1), unless the authorization is terminated or revoked.  Performed at [facility], [address], [place], [place] [address]     Labs: CBC: Recent Labs  Lab 05/06/22 0241 05/07/22 0622 05/08/22 0555 05/09/22 0533  WBC 11.4* 7.1 6.0 6.1  HGB 9.1* 7.9* 8.0* 8.5*  HCT 27.9* 24.4* 24.4*  25.8*  MCV 90.0 89.4 89.4 89.0  PLT 245 244 258 291   Basic Metabolic Panel: Recent Labs  Lab 05/06/22 0241 05/07/22 0622 05/07/22 1524 05/08/22 0555  NA 143 140 140 138  K 2.7* 2.7* 3.1* 3.7  CL 106 109 105 108  CO2 GLUCOSE 80 80 121* 84  BUN 5* <5* <5* <5*  CREATININE 0.72 0.63 0.70 0.62  CALCIUM 8.4* 7.9* 8.0* 7.9*  MG 1.6*  --  2.4  --    Liver Function Tests: No results for input(s): "AST", "ALT", "ALKPHOS", "BILITOT", "PROT", "ALBUMIN" in the last 168 hours.  CBG: Recent Labs  Lab 05/05/22 1612  GLUCAP 76    Discharge time spent: greater than 30 minutes.  Signed: Tyrone Nine, MD [facility] 05/12/2022

## 2022-05-12 NOTE — Progress Notes (Signed)
Physical Therapy Treatment Patient Details Name: [name] [name] MRN: [id number] DOB: 28-Sep-1934 Today's Date: 05/12/2022   History of Present Illness [name] [name] is a 88 y.o. female presenting with dysphagia. MRI postive for acute R MCA infarct, CT for PE. Recent hospital admission in [facility] 7/29. PMH: stroke with left-sided spastic hemiparesis, CABG, hypertension, chronic diastolic heart failure, and hypothyroidism    PT Comments    Pt progressing steadily toward goals, agreeable to participation.  Emphasis on warm up exercise, transition to sitting EOB, sitting balance, scooting, sit to stand safety and transfer to recliner.    Recommendations for follow up therapy are one component of a multi-disciplinary discharge planning process, led by the attending physician.  Recommendations may be updated based on patient status, additional functional criteria and insurance authorization.  Follow Up Recommendations  Skilled nursing-short term rehab (<3 hours/day) Can patient physically be transported by private vehicle: No   Assistance Recommended at Discharge Frequent or constant Supervision/Assistance  Patient can return home with the following A lot of help with walking and/or transfers;A lot of help with bathing/dressing/bathroom;Direct supervision/assist for medications management;Direct supervision/assist for financial management;Assist for transportation;Help with stairs or ramp for entrance;Assistance with feeding;Assistance with cooking/housework   Equipment Recommendations  None recommended by PT    Recommendations for Other Services       Precautions / Restrictions Precautions Precautions: Fall Precaution Comments: L hemiparesis baseline Restrictions Weight Bearing Restrictions: No     Mobility  Bed Mobility Overal bed mobility: Needs Assistance Bed Mobility: Supine to Sit   Sidelying to sit: Max assist Supine to sit: Max assist     General bed mobility comments:  cues for direction, truncal assist up via L elbow with w/bearing facilitated through L hand,  assisted symmetrical scooting to EOB    Transfers Overall transfer level: Needs assistance   Transfers: Sit to/from Stand, Bed to chair/wheelchair/BSC Sit to Stand: Mod assist, +2 physical assistance (x2) Stand pivot transfers: Max assist, +2 safety/equipment         General transfer comment: cues for hand placement/assist L UE to help with push off, assist forward and with boost x2, stability assist for transfer to the chair.    Ambulation/Gait               General Gait Details: pt non-ambulatory at baseline   Stairs             Wheelchair Mobility    Modified Rankin (Stroke Patients Only) Modified Rankin (Stroke Patients Only) Pre-Morbid Rankin Score: Severe disability Modified Rankin: Severe disability     Balance Overall balance assessment: Needs assistance Sitting-balance support: Feet supported, Single extremity supported Sitting balance-Leahy Scale: Poor Sitting balance - Comments: pt reliant on use of R UE for consistent maintanence of balance.   Standing balance support: During functional activity Standing balance-Leahy Scale: Zero Standing balance comment: dependent on external support                            Cognition Arousal/Alertness: Awake/alert Behavior During Therapy: WFL for tasks assessed/performed Overall Cognitive Status: History of cognitive impairments - at baseline                                 General Comments: per son pt with memory deficits from previous cva.        Exercises General Exercises - Lower Extremity Hip ABduction/ADduction:  (  isometric pillow squeeze) Other Exercises Other Exercises: warm up hip/knee flexion/ext ROM with graded assist/resistance x10 reps bil Other Exercises: bicep/tricep presses with graded assist/resistance bil x 10 reps    General Comments        Pertinent  Vitals/Pain Pain Assessment Pain Assessment: Faces Faces Pain Scale: No hurt Pain Intervention(s): Monitored during session    Home Living                          Prior Function            PT Goals (current goals can now be found in the care plan section) Acute Rehab PT Goals Patient Stated Goal: unable to state PT Goal Formulation: With patient Time For Goal Achievement: 05/20/22 Potential to Achieve Goals: Fair Progress towards PT goals: Progressing toward goals    Frequency    Min 3X/week      PT Plan Current plan remains appropriate    Co-evaluation              AM-PAC PT "6 Clicks" Mobility   Outcome Measure                   End of Session Equipment Utilized During Treatment: Gait belt Activity Tolerance: Patient tolerated treatment well Patient left: in chair;with call bell/phone within reach;with chair alarm set Nurse Communication: Mobility status PT Visit Diagnosis: Muscle weakness (generalized) (M62.81);Other symptoms and signs involving the nervous system (R29.898)     Time: 2979-8921 PT Time Calculation (min) (ACUTE ONLY): 33 min  Charges:  $Therapeutic Exercise: 8-22 mins $Therapeutic Activity: 8-22 mins                     05/12/2022  [name] Halim., PT Acute Rehabilitation Services [phone]  (pager) [phone]  (office)   [name] [name] 05/12/2022, 5:15 PM

## 2022-05-12 NOTE — TOC Transition Note (Signed)
Transition of Care [facility]) - CM/SW Discharge Note   Patient Details  Name: [name] [name] MRN: [id number] Date of Birth: 1934-05-25  Transition of [facility]) CM/SW Contact:  Baldemar Lenis, LCSW Phone Number: 05/12/2022, 3:22 PM   Clinical Narrative:   CSW confirmed that insurance authorization was received, patient can admit to [facility] today. CSW notified daughter, Huntley Dec, via text message. Transport scheduled with [facility] for next available.  Nurse to call report to [phone], Room 501a.    Final next level of care: Skilled Nursing Facility Barriers to Discharge: Barriers Resolved   Patient Goals and CMS Choice   CMS Medicare.gov Compare Post Acute Care list provided to:: Patient Represenative (must comment) Choice offered to / list presented to : Adult Children, Patient  Discharge Placement              Patient chooses bed at: [facility] Patient to be transferred to facility by: [facility] Name of family member notified: Huntley Dec Patient and family notified of of transfer: 05/12/22  Discharge Plan and Services In-house Referral: Clinical Social Work   Post Acute Care Choice: Skilled Nursing Facility                               Social Determinants of Health (SDOH) Interventions     Readmission Risk Interventions     No data to display

## 2022-05-28 ENCOUNTER — Non-Acute Institutional Stay: Payer: Medicare Other | Admitting: Primary Care

## 2022-05-28 DIAGNOSIS — I639 Cerebral infarction, unspecified: Secondary | ICD-10-CM

## 2022-05-28 DIAGNOSIS — I63511 Cerebral infarction due to unspecified occlusion or stenosis of right middle cerebral artery: Secondary | ICD-10-CM

## 2022-05-28 DIAGNOSIS — I5032 Chronic diastolic (congestive) heart failure: Secondary | ICD-10-CM

## 2022-05-28 DIAGNOSIS — Z515 Encounter for palliative care: Secondary | ICD-10-CM

## 2022-05-28 NOTE — Progress Notes (Signed)
[facility] Palliative Care Consult Note Telephone: [phone]  Fax: [phone]   Date of encounter: 05/28/22 2:43 PM PATIENT NAME: [name] [name] [id number]   [phone] (home)  DOB: June 02, 1934 MRN: [id number] PRIMARY CARE PROVIDER:    Ludwig Clarks, [place],  [place] [address] [phone]  REFERRING PROVIDER:   Caprice Renshaw, [place],  [place] [address] [phone]   RESPONSIBLE PARTY:    Contact Information     Name Relation Home Work [place] Son [place] Daughter [phone]     [name],Mary Daughter [phone]  [phone]       I met face to face with patient and family in  home. Palliative Care was asked to follow this patient by consultation request of  Caprice Renshaw, MD  to address advance care planning and complex medical decision making. This is the initial visit.                                     ASSESSMENT AND PLAN / RECOMMENDATIONS:   Advance Care Planning/Goals of Care: Goals include to maximize quality of life and symptom management. Patient/health care surrogate gave his/her permission to discuss.  Exploration of personal, cultural or spiritual beliefs that might influence medical decisions  Exploration of goals of care in the event of a sudden injury or illness  Identification of a healthcare agent - son and daughters Review of an  advance directive document . Precipitous decline if weight reports accurate, and would qualify for hospice services. CODE STATUS: FULL  Symptom Management/Plan:  I met with patient in her nursing home room. This was our first meeting. Her son was also present. She is self feeding ice cream with her right hand. She has left sided deficits. They endorse she has been doing rehab for three weeks and social worker informed me they are currently in an appeal for more days. I discuss with son care  needs when she gets home. She plans to return to her home living with her daughter. 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 We discussed home health services as well , which will be valuable, but may not be available in her area.  We can continue to follow for palliative medicine while in the skilled facility or when she discharges to her home  Follow up Palliative Care Visit: Palliative care will continue to follow for complex medical decision making, advance care planning, and clarification of goals. Return 2-4 weeks or prn.  I spent 35 minutes providing this consultation. More than 50% of the time in this consultation was spent in counseling and care coordination.  PPS: 30%  HOSPICE ELIGIBILITY/DIAGNOSIS: TBD  Chief Complaint: debility, s/p CVA, L side function loss  HISTORY OF PRESENT ILLNESS:  [name] [name] is a 88 y.o. year old female  with cva,  . Patient seen today to review palliative care needs to include medical decision making and advance care planning as appropriate.   History obtained from review of EMR, discussion with primary team, and interview with family, facility staff/caregiver and/or [name].  I reviewed available labs, medications, imaging, studies and related documents from the EMR.  Records reviewed and summarized above.   ROS   General: NAD ENMT: denies dysphagia Pulmonary: denies cough,  denies increased SOB Abdomen: endorses good appetite, denies constipation, endorses incontinence of bowel GU: denies dysuria, endorses incontinence of urine MSK:  denies increased weakness, no falls reported Skin: denies rashes or wounds Neurological: denies pain, denies insomnia, L hemiparesis Psych: Endorses positive mood Heme/lymph/immuno: denies bruises, abnormal bleeding  Physical Exam: Current and past weights: 104 lbs, record consistently shows 147 lbs, so a 45 lb loss in 3 months, 30% loss.  Constitutional:  NAD General: frail appearing, thin  EYES: anicteric sclera, lids intact, no discharge  ENMT: intact hearing, oral mucous membranes moist,  CV: no LE edema Pulmonary:  no increased work of breathing, no cough, room air Abdomen: intake 70%,  soft and non tender, no ascites GU: deferred MSK: + sarcopenia, moves all extremities,  non ambulatory Skin: warm and dry, no rashes or wounds on visible skin Neuro:  + generalized weakness,  + cognitive impairment, L sided function loss Psych: non-anxious affect, A and O x 2 Hem/lymph/immuno: no widespread bruising CURRENT PROBLEM LIST:  Patient Active Problem List   Diagnosis Date Noted   Acute CVA (cerebrovascular accident) ([place]) 05/05/2022   Acute right MCA stroke ([place]) 05/04/2022   Acute pulmonary embolism ([place]) 05/04/2022   Anemia 05/04/2022   Acute ischemic stroke ([place]) 06/17/2021   Transient ischemic attack 06/16/2021   Slurred speech 06/16/2021   Headache 06/16/2021   Hyperglycemia 06/16/2021   Hypothyroidism 06/16/2021   Ischemic cardiomyopathy 08/24/2018   Ectopic atrial rhythm 08/24/2018   Ileus, postoperative ([place]) 05/12/2014   Osteoarthritis of left hip 05/09/2014   Hip arthritis 05/09/2014   Primary osteoarthritis of right hip 10/27/2011   Hip pain 08/24/2011   Chronic diastolic heart failure ([place]) 12/24/2009   SHORTNESS OF BREATH 11/29/2009   Hyperlipidemia 09/17/2008   Essential hypertension 09/17/2008   Coronary artery disease 09/17/2008   CAROTID ARTERY STENOSIS 09/17/2008   PAST MEDICAL HISTORY:  Active Ambulatory Problems    Diagnosis Date Noted   Hyperlipidemia 09/17/2008   Essential hypertension 09/17/2008   Coronary artery disease 09/17/2008   Chronic diastolic heart failure ([place]) 12/24/2009   CAROTID ARTERY STENOSIS 09/17/2008   SHORTNESS OF BREATH 11/29/2009   Hip pain 08/24/2011   Primary osteoarthritis of right hip 10/27/2011   Osteoarthritis of left hip 05/09/2014   Hip arthritis 05/09/2014   Ileus,  postoperative ([place]) 05/12/2014   Ischemic cardiomyopathy 08/24/2018   Ectopic atrial rhythm 08/24/2018   Transient ischemic attack 06/16/2021   Slurred speech 06/16/2021   Headache 06/16/2021   Hyperglycemia 06/16/2021   Hypothyroidism 06/16/2021   Acute ischemic stroke ([place]) 06/17/2021   Acute right MCA stroke ([place]) 05/04/2022   Acute pulmonary embolism ([place]) 05/04/2022   Anemia 05/04/2022   Acute CVA (cerebrovascular accident) ([place]) 05/05/2022   Resolved Ambulatory Problems    Diagnosis Date Noted   No Resolved Ambulatory Problems   Past Medical History:  Diagnosis Date   Ankle edema    Arthritis    Bronchitis    CAD (coronary artery disease)    Carotid stenosis    Cataract    Constipation    Diabetes mellitus    DJD (degenerative joint disease) of hip    Dry cough    GERD (gastroesophageal reflux disease)    Headache(784.0)    HTN (hypertension)    Hx of cardiovascular stress test    Hypercholesteremia    Myocardial infarction ([place])    Nocturia    SOCIAL HX:  Social History   Tobacco Use   Smoking status: Never  Smokeless tobacco: Never  Substance Use Topics   Alcohol use: No   FAMILY HX:  Family History  Problem Relation Age of Onset   Hypertension Mother    Stroke Mother    Hypertension Father    Heart attack Brother    Heart attack Sister    Hypertension Sister    Hypertension Brother    Hypertension Daughter    Hypertension Son    Hyperlipidemia Sister    Diabetes Brother    Anesthesia problems Neg Hx    Hypotension Neg Hx    Malignant hyperthermia Neg Hx    Pseudochol deficiency Neg Hx       ALLERGIES: No Known Allergies   PERTINENT MEDICATIONS:  Outpatient Encounter Medications as of 05/28/2022  Medication Sig   acetaminophen (TYLENOL) 500 MG tablet Take 1,000 mg by mouth every 6 (six) hours as needed for mild pain.   amLODipine (NORVASC) 10 MG tablet Take 1 tablet (10 mg total) by mouth daily.   apixaban (ELIQUIS) 5 MG TABS tablet  Take 2 tablets (10 mg total) by mouth 2 (two) times daily for 6 days, THEN 1 tablet (5 mg total) 2 (two) times daily.   diclofenac Sodium (VOLTAREN) 1 % GEL Apply 2 g topically daily as needed for pain.   esomeprazole (NEXIUM) 40 MG capsule Take 40 mg by mouth daily.   fluticasone (FLONASE) 50 MCG/ACT nasal spray Place 1 spray into both nostrils daily. (Patient taking differently: Place 1 spray into both nostrils daily as needed for allergies or rhinitis.)   gabapentin (NEURONTIN) 100 MG capsule Take 100 mg by mouth 2 (two) times daily.   levothyroxine (SYNTHROID) 75 MCG tablet Take 75 mcg by mouth daily.   nitroGLYCERIN (NITROSTAT) 0.4 MG SL tablet Place 1 tablet (0.4 mg total) under the tongue every 5 (five) minutes as needed for chest pain.   potassium chloride (KLOR-CON) 20 MEQ packet Take 20 mEq by mouth daily.   rosuvastatin (CRESTOR) 40 MG tablet TAKE 1 TABLET BY MOUTH ONCE DAILY. (Patient taking differently: Take 40 mg by mouth daily.)   senna-docusate (SENOKOT-S) 8.6-50 MG tablet Take 1 tablet by mouth daily.   cyclobenzaprine (FLEXERIL) 5 MG tablet Take 5 mg by mouth 2 (two) times daily as needed for muscle spasms. (Patient not taking: Reported on 05/28/2022)   No facility-administered encounter medications on file as of 05/28/2022.     Thank you for the opportunity to participate in the care of Ms. Abella.  The palliative care team will continue to follow. Please call our office at [phone] if we can be of additional assistance.   Jason Coop, NP , DNP, AGPCNP-BC  COVID-19 PATIENT SCREENING TOOL Asked and negative response unless otherwise noted:  Have you had symptoms of covid, tested positive or been in contact with someone with symptoms/positive test in the past 5-10 days?

## 2022-06-19 ENCOUNTER — Other Ambulatory Visit: Payer: Medicare Other | Admitting: Nurse Practitioner

## 2022-06-19 ENCOUNTER — Telehealth: Payer: Self-pay

## 2022-06-19 ENCOUNTER — Encounter: Payer: Self-pay | Admitting: Nurse Practitioner

## 2022-06-19 DIAGNOSIS — I639 Cerebral infarction, unspecified: Secondary | ICD-10-CM

## 2022-06-19 DIAGNOSIS — R634 Abnormal weight loss: Secondary | ICD-10-CM

## 2022-06-19 DIAGNOSIS — Z515 Encounter for palliative care: Secondary | ICD-10-CM

## 2022-06-19 DIAGNOSIS — R5381 Other malaise: Secondary | ICD-10-CM

## 2022-06-19 DIAGNOSIS — R630 Anorexia: Secondary | ICD-10-CM

## 2022-06-19 NOTE — Telephone Encounter (Signed)
PC SW outreached patients daughter, Huntley Dec, per [facility] NP - C. Giusler, SW request to address questions about PCS.  Daughter shared that she thought she applied to [facility] and signed some papers to be able to be paid to care of patient. Conversation was difficult to follow. Daughter then stated that she needed to find the paperwork she had and call the person she completed it with. SW advised that would be best. Daughter stated she would make that call tomorrow.

## 2022-06-19 NOTE — Progress Notes (Signed)
Therapist, nutritional Palliative Care Consult Note Telephone: [phone]  Fax: [phone]    Date of encounter: 06/19/22 3:35 PM PATIENT NAME: [name] [name] [address] [place] [address]   [phone] (home)  DOB: Jan 30, 1934 MRN: [id number] PRIMARY CARE PROVIDER:    Oneal Grout, FNP,  [address] [place] [address] [phone]  REFERRING PROVIDER:   Oneal Grout, FNP [address] [place],  [place] [address] [phone]  RESPONSIBLE PARTY:    Contact Information     Name Relation Home Work [place] Son [phone]     [name], [name] [phone]     [name],[name] [phone]  [phone]       Due to the COVID-19 crisis, this visit was done via telemedicine from my office and it was initiated and consent by this patient and or family.  I connected with [name] [name], [name] [name] with  [name] [name] OR [employer] on 06/19/22 by telephone as video not available enabled telemedicine application and verified that I am speaking with the correct person using two identifiers.   I discussed the limitations of evaluation and management by telemedicine. The patient expressed understanding and agreed to proceed. Palliative Care was asked to follow this patient by consultation request of  Oneal Grout, FNP to address advance care planning and complex medical decision making. This is a follow up visit.                                  ASSESSMENT AND PLAN / RECOMMENDATIONS:  Symptom Management/Plan: 1. Advance Care Planning;  Full code 2. Goals of Care: Goals include to maximize quality of life and symptom management. Our advance care planning conversation included a discussion about:    The value and importance of advance care planning  Exploration of personal, cultural or spiritual beliefs that might influence medical decisions  Exploration of goals of care in the event of a sudden injury or illness   Identification and preparation of a healthcare agent  Review and updating or creation of an advance directive document.  3. Debility secondary to late effects CVA, improving per [name] [name] [name], continue PT/OT in home, positive motivation, fall precautions and safety education discussed  4. Anorexia; improving; discussed at length with [name] [name] [name], reviewed daily intake, nutritional counseling done. We talked about supplements, snacks, dysphagia, weights. Continue to monitor and follow.   05/23/2020 weight 142.9 lbs 12/03/2021 weight 140 lbs 04/03/2022 weight 137 lbs 05/04/2022 weight 147 lbs 05/28/2022 weight 104 lbs (PC note) 43 lbs loss/6 weeks; 29.25% loss  5. Palliative care encounter; Palliative care encounter; Palliative medicine team will continue to support patient, patient's family, and medical team. Visit consisted of counseling and education dealing with the complex and emotionally intense issues of symptom management and palliative care in the setting of serious and potentially life-threatening illness  Follow up Palliative Care Visit: Palliative care will continue to follow for complex medical decision making, advance care planning, and clarification of goals. Return 4 weeks or prn.  I spent 33 minutes providing this consultation. More than 50% of the time in this consultation was spent in counseling and care coordination. PPS: 40%  Chief Complaint: Follow up palliative consult for complex medical decision making, address goals, manage ongoing symptoms  HISTORY OF PRESENT ILLNESS:  [name] [name] is a 88 y.o. year old female  with multiple medical problems including right  MCA stroke, left sided hemiparesis, dysphagia, CABG, diastolic heart failure, h/o pulmonary embolism, anemia, hypothyroidism. Hospitalized 05/04/2022 to 05/12/2022 for acute right MCA stroke with h/o strokes. CT chest revealed large bilateral central pulmonary artery emboli extending into the lobar branch. Tolerated  anticoagulation with hgb stabilized 8's. Ultrasound revealed acute DVT. She was stabilized and d/c to STR at [facility]. PC did see during STR stay. [name]. [name] completed rehab and d/c home where she lives with [name] [name] [name]. I called [name] [name] for follow up telemedicine telephonic visit as video not available. [name] [name] with [name] [name] and I talked about purpose of pc visit, past medical history, recent hospitalization with str. We talked about her functional abilities where [name] [name] is having to help her up from a chair, total care, receiving in home PT/OT. [name] [name] endorses she is having to bath, dress [name] [name]. [name] [name] endorses [name] [name] is able to feed herself and appetite has started improving though had about 20 lb weight loss since hospitalization. We talked about foods, nutrition education completed. We talked about medical goals, quality of life, caregiver stress, fatigue. We talked about coping strategies. [name] [name] endorses she had applied for [facility] services but has not heard from the agency or process. Will ask PC SW to contact to answer questions. We talked about f/u pc visit, [name] [name] in agreement, scheduled. Therapeutic listening, emotional support provided. Questions answered.   History obtained from review of EMR, discussion with [name] [name] [name] with [name] [name].  I reviewed available labs, medications, imaging, studies and related documents from the EMR.  Records reviewed and summarized above.   ROS 10 point system reviewed all negative except HPI  Physical Exam: Deferred Thank you for the opportunity to participate in the care of [name] [name].  The palliative care team will continue to follow. Please call our office at [phone] if we can be of additional assistance.   Jerick Khachatryan [place], NP

## 2022-07-24 ENCOUNTER — Telehealth: Payer: Medicare Other | Admitting: Nurse Practitioner

## 2022-09-05 ENCOUNTER — Telehealth: Payer: Self-pay

## 2022-09-05 NOTE — Telephone Encounter (Signed)
1127 am.  Phone call made to daughter Huntley Dec to complete a telephonic visit.  No answer.  Message left requesting a call back.

## 2022-09-15 ENCOUNTER — Telehealth: Payer: Self-pay

## 2022-09-15 NOTE — Telephone Encounter (Signed)
228 pm.  Phone call made to daughter Huntley Dec to follow up on patient status.  No answer and unable to leave VM.

## 2023-08-12 ENCOUNTER — Encounter: Payer: Self-pay | Admitting: Internal Medicine

## 2023-08-20 ENCOUNTER — Encounter: Payer: Self-pay | Admitting: Internal Medicine

## 2023-08-24 NOTE — Progress Notes (Deleted)
Referring Provider: Oneal Grout, FNP Primary Care Physician:  Oneal Grout, FNP Primary Gastroenterologist:  Dr. Marletta Lor  No chief complaint on file.   HPI:   [name] [name] is a 89 y.o. female with past medical history of cerebrovascular accident with residual left-sided weakness, HTN, HLD, pulmonary embolism on Eliquis, coronary artery disease status post PCI and CABG, grade 1 diastolic dysfunction, CKD stage III, hypothyroidism, chronic anemia, diabetes, presenting today at the request of  Oneal Grout, FNP for upper GI bleed.   Recent admission at [facility] 10/19 - 08/01/2023 with upper GI bleed.  Reviewed discharge summary which states she initially presented to [facility] on 10/11 with 3 days of generalized weakness and dark stools and was found to have a creatinine of 5.5, multiple episodes of hypotension, worsening anemia requiring frequent IV fluid support and total of 4 units PRBCs.  Due to no GI service over the weekend, she was transferred to [facility].  EGD completed 10/19 showing large posterior duodenal bulb ulcer with visible vessel s/p clip placement, gastric AVM with Hemoclip placement.  She completed octreotide drip and was continued on PPI twice daily.  GI gave okay to resume Eliquis. Hgb was 8.9 on day of discharge.   During her admission, she was also uremic, encephalopathic, and found to have a UTI.  Today:     Full EGD report from [facility] not available.   Past Medical History:  Diagnosis Date   Ankle edema    Arthritis    right hip   Bronchitis    in Dec   CAD (coronary artery disease)    a. s/p DES to LAD in 2008, NST in 2015 and 2017 which were low-risk   Carotid stenosis    mild   Cataract    left;not ready to come off   Constipation    takes OTC stool softener as needed   Diabetes mellitus    pt doesn't take any meds for it   DJD (degenerative joint disease) of hip    right   Dry  cough    GERD (gastroesophageal reflux disease)    Headache(784.0)    occasionally   HTN (hypertension)    takes Amlodpine and Carvedilol daily   Hx of cardiovascular stress test    Lexiscan Myoview (04/2014):  No ischemia, EF 64%, Low Risk   Hypercholesteremia    takes Crestor daily   Hypothyroidism    takes Synthroid daily   Ileus, postoperative (HCC) 05/12/2014   Myocardial infarction [facility])    pt unsure but thinks about 61yrs ago;is taking Plavix daily    Nocturia    Osteoarthritis of left hip 05/09/2014   Primary osteoarthritis of right hip 10/27/2011    Past Surgical History:  Procedure Laterality Date   ABDOMINAL HYSTERECTOMY  20+yrs   BACK SURGERY  15+yrs ago   CARDIAC CATHETERIZATION  2008   CORONARY ARTERY BYPASS GRAFT     LYMPH NODE DISSECTION  20+yrs ago   right    TOTAL HIP ARTHROPLASTY  10/27/2011   Procedure: TOTAL HIP ARTHROPLASTY;  Surgeon: Eulas Post, MD;  Location: [facility] OR;  Service: Orthopedics;  Laterality: Right;   TOTAL HIP ARTHROPLASTY Left 05/09/2014   Procedure: LEFT TOTAL HIP ARTHROPLASTY;  Surgeon: Eulas Post, MD;  Location: [facility] OR;  Service: Orthopedics;  Laterality: Left;    Current Outpatient Medications  Medication Sig Dispense Refill   acetaminophen (TYLENOL) 500 MG tablet Take  1,000 mg by mouth every 6 (six) hours as needed for mild pain.     amLODipine (NORVASC) 10 MG tablet Take 1 tablet (10 mg total) by mouth daily. 90 tablet 3   apixaban (ELIQUIS) 5 MG TABS tablet Take 2 tablets (10 mg total) by mouth 2 (two) times daily for 6 days, THEN 1 tablet (5 mg total) 2 (two) times daily. 60 tablet    cyclobenzaprine (FLEXERIL) 5 MG tablet Take 5 mg by mouth 2 (two) times daily as needed for muscle spasms. (Patient not taking: Reported on 05/28/2022)     diclofenac Sodium (VOLTAREN) 1 % GEL Apply 2 g topically daily as needed for pain.     esomeprazole (NEXIUM) 40 MG capsule Take 40 mg by mouth daily.     fluticasone (FLONASE) 50 MCG/ACT nasal spray  Place 1 spray into both nostrils daily. (Patient taking differently: Place 1 spray into both nostrils daily as needed for allergies or rhinitis.) 16 g 2   gabapentin (NEURONTIN) 100 MG capsule Take 100 mg by mouth 2 (two) times daily.     levothyroxine (SYNTHROID) 75 MCG tablet Take 75 mcg by mouth daily.     nitroGLYCERIN (NITROSTAT) 0.4 MG SL tablet Place 1 tablet (0.4 mg total) under the tongue every 5 (five) minutes as needed for chest pain. 25 tablet 3   potassium chloride (KLOR-CON) 20 MEQ packet Take 20 mEq by mouth daily.     rosuvastatin (CRESTOR) 40 MG tablet TAKE 1 TABLET BY MOUTH ONCE DAILY. (Patient taking differently: Take 40 mg by mouth daily.) 30 tablet 11   senna-docusate (SENOKOT-S) 8.6-50 MG tablet Take 1 tablet by mouth daily.     No current facility-administered medications for this visit.    Allergies as of 08/26/2023   (No Known Allergies)    Family History  Problem Relation Age of Onset   Hypertension Mother    Stroke Mother    Hypertension Father    Heart attack Brother    Heart attack Sister    Hypertension Sister    Hypertension Brother    Hypertension Daughter    Hypertension Son    Hyperlipidemia Sister    Diabetes Brother    Anesthesia problems Neg Hx    Hypotension Neg Hx    Malignant hyperthermia Neg Hx    Pseudochol deficiency Neg Hx     Social History   Socioeconomic History   Marital status: Widowed    Spouse name: Not on file   Number of children: Not on file   Years of education: Not on file   Highest education level: Not on file  Occupational History   Not on file  Tobacco Use   Smoking status: Never   Smokeless tobacco: Never  Vaping Use   Vaping status: Never Used  Substance and Sexual Activity   Alcohol use: No   Drug use: No   Sexual activity: Never    Birth control/protection: Surgical  Other Topics Concern   Not on file  Social History Narrative   Not on file   Social Determinants of Health   Financial Resource  Strain: Patient Unable To Answer (07/27/2023)   Received from [facility]   Overall Financial Resource Strain (CARDIA)    Difficulty of Paying Living Expenses: Patient unable to answer  Food Insecurity: Patient Unable To Answer (07/27/2023)   Received from [facility]   Hunger Vital Sign    Worried About Running Out of Food in the Last Year: Patient unable to  answer    Ran Out of Food in the Last Year: Patient unable to answer  Transportation Needs: Patient Unable To Answer (07/27/2023)   Received from [facility] - Transportation    Lack of Transportation (Medical): Patient unable to answer    Lack of Transportation (Non-Medical): Patient unable to answer  Physical Activity: Patient Unable To Answer (07/27/2023)   Received from [facility]   Exercise Vital Sign    Days of Exercise per Week: Patient unable to answer    Minutes of Exercise per Session: Patient unable to answer  Stress: Patient Unable To Answer (07/27/2023)   Received from [facility] - Occupational Stress Questionnaire    Feeling of Stress : Patient unable to answer  Social Connections: Patient Unable To Answer (07/27/2023)   Received from [facility]   Social Connection and Isolation Panel [NHANES]    Frequency of Communication with Friends and Family: Patient unable to answer    Frequency of Social Gatherings with Friends and Family: Patient unable to answer    Attends Religious Services: Patient unable to answer    Active Member of Clubs or Organizations: Patient unable to answer    Attends Banker Meetings: Patient unable to answer    Marital Status: Patient unable to answer  Intimate Partner Violence: Patient Unable To Answer (07/27/2023)   Received from [facility]   Humiliation, Afraid, Rape, and Kick questionnaire    Fear of Current or Ex-Partner: Patient unable to answer    Emotionally Abused: Patient unable to  answer    Physically Abused: Patient unable to answer    Sexually Abused: Patient unable to answer    Review of Systems: Gen: Denies any fever, chills, fatigue, weight loss, lack of appetite.  CV: Denies chest pain, heart palpitations, peripheral edema, syncope.  Resp: Denies shortness of breath at rest or with exertion. Denies wheezing or cough.  GI: Denies dysphagia or odynophagia. Denies jaundice, hematemesis, fecal incontinence. GU : Denies urinary burning, urinary frequency, urinary hesitancy MS: Denies joint pain, muscle weakness, cramps, or limitation of movement.  Derm: Denies rash, itching, dry skin Psych: Denies depression, anxiety, memory loss, and confusion Heme: Denies bruising, bleeding, and enlarged lymph nodes.  Physical Exam: There were no vitals taken for this visit. General:   Alert and oriented. Pleasant and cooperative. Well-nourished and well-developed.  Head:  Normocephalic and atraumatic. Eyes:  Without icterus, sclera clear and conjunctiva pink.  Ears:  Normal auditory acuity. Lungs:  Clear to auscultation bilaterally. No wheezes, rales, or rhonchi. No distress.  Heart:  S1, S2 present without murmurs appreciated.  Abdomen:  +BS, soft, non-tender and non-distended. No HSM noted. No guarding or rebound. No masses appreciated.  Rectal:  Deferred  Msk:  Symmetrical without gross deformities. Normal posture. Extremities:  Without edema. Neurologic:  Alert and  oriented x4;  grossly normal neurologically. Skin:  Intact without significant lesions or rashes. Psych:  Alert and cooperative. Normal mood and affect.    Assessment:     Plan:  ***   Ermalinda Memos, PA-C [facility] 08/26/2023

## 2023-08-26 ENCOUNTER — Ambulatory Visit: Payer: 59 | Admitting: Gastroenterology

## 2023-08-30 NOTE — Progress Notes (Unsigned)
Referring Provider: Oneal Grout, FNP Primary Care Physician:  Oneal Grout, FNP Primary Gastroenterologist:  Dr. Marletta Lor  No chief complaint on file.   HPI:   [name] [name] is a 89 y.o. female with past medical history of cerebrovascular accident with residual left-sided weakness, HTN, HLD, pulmonary embolism on Eliquis, coronary artery disease status post PCI and CABG, grade 1 diastolic dysfunction, CKD stage III, hypothyroidism, chronic anemia, diabetes, presenting today at the request of  Oneal Grout, FNP for upper GI bleed.    Recent admission at [facility] 10/19 - 08/01/2023 with upper GI bleed.  Reviewed discharge summary which states she initially presented to [facility] on 10/11 with 3 days of generalized weakness and dark stools and was found to have a creatinine of 5.5, multiple episodes of hypotension, worsening anemia requiring frequent IV fluid support and total of 4 units PRBCs.  Due to no GI service over the weekend, she was transferred to [facility].  EGD completed 10/19 showing large posterior duodenal bulb ulcer with visible vessel s/p clip placement, gastric AVM with Hemoclip placement.  She completed octreotide drip and was continued on PPI twice daily.  GI gave okay to resume Eliquis. Hgb was 8.9 on day of discharge.    During her admission, she was also uremic, encephalopathic, and found to have a UTI.   Today:        Full EGD report from [facility] not available.   Past Medical History:  Diagnosis Date   Ankle edema    Arthritis    right hip   Bronchitis    in Dec   CAD (coronary artery disease)    a. s/p DES to LAD in 2008, NST in 2015 and 2017 which were low-risk   Carotid stenosis    mild   Cataract    left;not ready to come off   Constipation    takes OTC stool softener as needed   Diabetes mellitus    pt doesn't take any meds for it   DJD (degenerative joint disease) of hip    right    Dry cough    GERD (gastroesophageal reflux disease)    Headache(784.0)    occasionally   HTN (hypertension)    takes Amlodpine and Carvedilol daily   Hx of cardiovascular stress test    Lexiscan Myoview (04/2014):  No ischemia, EF 64%, Low Risk   Hypercholesteremia    takes Crestor daily   Hypothyroidism    takes Synthroid daily   Ileus, postoperative (HCC) 05/12/2014   Myocardial infarction [facility])    pt unsure but thinks about 11yrs ago;is taking Plavix daily    Nocturia    Osteoarthritis of left hip 05/09/2014   Primary osteoarthritis of right hip 10/27/2011    Past Surgical History:  Procedure Laterality Date   ABDOMINAL HYSTERECTOMY  20+yrs   BACK SURGERY  15+yrs ago   CARDIAC CATHETERIZATION  2008   CORONARY ARTERY BYPASS GRAFT     LYMPH NODE DISSECTION  20+yrs ago   right    TOTAL HIP ARTHROPLASTY  10/27/2011   Procedure: TOTAL HIP ARTHROPLASTY;  Surgeon: Eulas Post, MD;  Location: [facility] OR;  Service: Orthopedics;  Laterality: Right;   TOTAL HIP ARTHROPLASTY Left 05/09/2014   Procedure: LEFT TOTAL HIP ARTHROPLASTY;  Surgeon: Eulas Post, MD;  Location: [facility] OR;  Service: Orthopedics;  Laterality: Left;    Current Outpatient Medications  Medication Sig Dispense Refill  acetaminophen (TYLENOL) 500 MG tablet Take 1,000 mg by mouth every 6 (six) hours as needed for mild pain.     amLODipine (NORVASC) 10 MG tablet Take 1 tablet (10 mg total) by mouth daily. 90 tablet 3   apixaban (ELIQUIS) 5 MG TABS tablet Take 2 tablets (10 mg total) by mouth 2 (two) times daily for 6 days, THEN 1 tablet (5 mg total) 2 (two) times daily. 60 tablet    cyclobenzaprine (FLEXERIL) 5 MG tablet Take 5 mg by mouth 2 (two) times daily as needed for muscle spasms. (Patient not taking: Reported on 05/28/2022)     diclofenac Sodium (VOLTAREN) 1 % GEL Apply 2 g topically daily as needed for pain.     esomeprazole (NEXIUM) 40 MG capsule Take 40 mg by mouth daily.     fluticasone (FLONASE) 50 MCG/ACT nasal  spray Place 1 spray into both nostrils daily. (Patient taking differently: Place 1 spray into both nostrils daily as needed for allergies or rhinitis.) 16 g 2   gabapentin (NEURONTIN) 100 MG capsule Take 100 mg by mouth 2 (two) times daily.     levothyroxine (SYNTHROID) 75 MCG tablet Take 75 mcg by mouth daily.     nitroGLYCERIN (NITROSTAT) 0.4 MG SL tablet Place 1 tablet (0.4 mg total) under the tongue every 5 (five) minutes as needed for chest pain. 25 tablet 3   potassium chloride (KLOR-CON) 20 MEQ packet Take 20 mEq by mouth daily.     rosuvastatin (CRESTOR) 40 MG tablet TAKE 1 TABLET BY MOUTH ONCE DAILY. (Patient taking differently: Take 40 mg by mouth daily.) 30 tablet 11   senna-docusate (SENOKOT-S) 8.6-50 MG tablet Take 1 tablet by mouth daily.     No current facility-administered medications for this visit.    Allergies as of 08/31/2023   (No Known Allergies)    Family History  Problem Relation Age of Onset   Hypertension Mother    Stroke Mother    Hypertension Father    Heart attack Brother    Heart attack Sister    Hypertension Sister    Hypertension Brother    Hypertension Daughter    Hypertension Son    Hyperlipidemia Sister    Diabetes Brother    Anesthesia problems Neg Hx    Hypotension Neg Hx    Malignant hyperthermia Neg Hx    Pseudochol deficiency Neg Hx     Social History   Socioeconomic History   Marital status: Widowed    Spouse name: Not on file   Number of children: Not on file   Years of education: Not on file   Highest education level: Not on file  Occupational History   Not on file  Tobacco Use   Smoking status: Never   Smokeless tobacco: Never  Vaping Use   Vaping status: Never Used  Substance and Sexual Activity   Alcohol use: No   Drug use: No   Sexual activity: Never    Birth control/protection: Surgical  Other Topics Concern   Not on file  Social History Narrative   Not on file   Social Determinants of Health   Financial  Resource Strain: Patient Unable To Answer (07/27/2023)   Received from [facility]   Overall Financial Resource Strain (CARDIA)    Difficulty of Paying Living Expenses: Patient unable to answer  Food Insecurity: Patient Unable To Answer (07/27/2023)   Received from [facility] Vital Sign    Worried About Running Out of Food in  the Last Year: Patient unable to answer    Ran Out of Food in the Last Year: Patient unable to answer  Transportation Needs: Patient Unable To Answer (07/27/2023)   Received from [facility] - Transportation    Lack of Transportation (Medical): Patient unable to answer    Lack of Transportation (Non-Medical): Patient unable to answer  Physical Activity: Patient Unable To Answer (07/27/2023)   Received from [facility]   Exercise Vital Sign    Days of Exercise per Week: Patient unable to answer    Minutes of Exercise per Session: Patient unable to answer  Stress: Patient Unable To Answer (07/27/2023)   Received from [facility] - Occupational Stress Questionnaire    Feeling of Stress : Patient unable to answer  Social Connections: Patient Unable To Answer (07/27/2023)   Received from [facility]   Social Connection and Isolation Panel [NHANES]    Frequency of Communication with Friends and Family: Patient unable to answer    Frequency of Social Gatherings with Friends and Family: Patient unable to answer    Attends Religious Services: Patient unable to answer    Active Member of Clubs or Organizations: Patient unable to answer    Attends Banker Meetings: Patient unable to answer    Marital Status: Patient unable to answer  Intimate Partner Violence: Patient Unable To Answer (07/27/2023)   Received from [facility]   Humiliation, Afraid, Rape, and Kick questionnaire    Fear of Current or Ex-Partner: Patient unable to answer    Emotionally Abused: Patient  unable to answer    Physically Abused: Patient unable to answer    Sexually Abused: Patient unable to answer    Review of Systems: Gen: Denies any fever, chills, fatigue, weight loss, lack of appetite.  CV: Denies chest pain, heart palpitations, peripheral edema, syncope.  Resp: Denies shortness of breath at rest or with exertion. Denies wheezing or cough.  GI: Denies dysphagia or odynophagia. Denies jaundice, hematemesis, fecal incontinence. GU : Denies urinary burning, urinary frequency, urinary hesitancy MS: Denies joint pain, muscle weakness, cramps, or limitation of movement.  Derm: Denies rash, itching, dry skin Psych: Denies depression, anxiety, memory loss, and confusion Heme: Denies bruising, bleeding, and enlarged lymph nodes.  Physical Exam: There were no vitals taken for this visit. General:   Alert and oriented. Pleasant and cooperative. Well-nourished and well-developed.  Head:  Normocephalic and atraumatic. Eyes:  Without icterus, sclera clear and conjunctiva pink.  Ears:  Normal auditory acuity. Lungs:  Clear to auscultation bilaterally. No wheezes, rales, or rhonchi. No distress.  Heart:  S1, S2 present without murmurs appreciated.  Abdomen:  +BS, soft, non-tender and non-distended. No HSM noted. No guarding or rebound. No masses appreciated.  Rectal:  Deferred  Msk:  Symmetrical without gross deformities. Normal posture. Extremities:  Without edema. Neurologic:  Alert and  oriented x4;  grossly normal neurologically. Skin:  Intact without significant lesions or rashes. Psych:  Alert and cooperative. Normal mood and affect.    Assessment:     Plan:  ***   Ermalinda Memos, PA-C [facility] 08/31/2023

## 2023-08-31 ENCOUNTER — Ambulatory Visit (INDEPENDENT_AMBULATORY_CARE_PROVIDER_SITE_OTHER): Payer: 59 | Admitting: Gastroenterology

## 2023-08-31 ENCOUNTER — Encounter: Payer: Self-pay | Admitting: Gastroenterology

## 2023-08-31 VITALS — BP 137/74 | HR 71 | Temp 96.8°F | Ht 60.0 in | Wt 125.0 lb

## 2023-08-31 DIAGNOSIS — K5900 Constipation, unspecified: Secondary | ICD-10-CM

## 2023-08-31 DIAGNOSIS — D649 Anemia, unspecified: Secondary | ICD-10-CM

## 2023-08-31 DIAGNOSIS — K269 Duodenal ulcer, unspecified as acute or chronic, without hemorrhage or perforation: Secondary | ICD-10-CM

## 2023-08-31 DIAGNOSIS — K279 Peptic ulcer, site unspecified, unspecified as acute or chronic, without hemorrhage or perforation: Secondary | ICD-10-CM

## 2023-08-31 MED ORDER — PANTOPRAZOLE SODIUM 40 MG PO TBEC: 40.0000 mg | DELAYED_RELEASE_TABLET | Freq: Two times a day (BID) | ORAL | 1 refills | Status: DC

## 2023-08-31 MED ORDER — SUCRALFATE 1 G PO TABS: 1.0000 g | ORAL_TABLET | Freq: Three times a day (TID) | ORAL | 0 refills | Status: DC

## 2023-08-31 NOTE — Patient Instructions (Addendum)
Please have blood work completed at [facility].  Continue pantoprazole 40 mg daily 30 minutes before breakfast and dinner for another 8 weeks.  After 8 weeks decrease pantoprazole to once daily.  Start Carafate 1 g 3 times daily before meals and at bedtime for the next 8 weeks, then stop.  You will dissolve the tablet in 1-2 tablespoons of water, then drink the slurry.  For constipation: Start MiraLAX 1 capful (17 g) 1-2 times daily in 8 ounces of water or other noncarbonated beverage of your choice. Please let me know of any ongoing problems with constipation.  I will plan to see you back in 3 months or sooner if needed.  Ermalinda Memos, PA-C [facility]

## 2023-09-06 ENCOUNTER — Telehealth: Payer: Self-pay | Admitting: Gastroenterology

## 2023-09-06 NOTE — Telephone Encounter (Signed)
Please remind patient's daughter that patient needs to have labs completed as ordered at [facility].

## 2023-09-08 NOTE — Telephone Encounter (Signed)
LMOM for pt to call office  

## 2023-09-17 LAB — CBC WITH DIFFERENTIAL/PLATELET
Basophils Absolute: 0.1 10*3/uL (ref 0.0–0.2)
Basos: 1 %
EOS (ABSOLUTE): 0.1 10*3/uL (ref 0.0–0.4)
Eos: 3 %
Hematocrit: 35 % (ref 34.0–46.6)
Hemoglobin: 11.3 g/dL (ref 11.1–15.9)
Immature Grans (Abs): 0 10*3/uL (ref 0.0–0.1)
Immature Granulocytes: 0 %
Lymphocytes Absolute: 2.3 10*3/uL (ref 0.7–3.1)
Lymphs: 45 %
MCH: 29.7 pg (ref 26.6–33.0)
MCHC: 32.3 g/dL (ref 31.5–35.7)
MCV: 92 fL (ref 79–97)
Monocytes Absolute: 0.5 10*3/uL (ref 0.1–0.9)
Monocytes: 9 %
Neutrophils Absolute: 2.2 10*3/uL (ref 1.4–7.0)
Neutrophils: 42 %
Platelets: 193 10*3/uL (ref 150–450)
RBC: 3.81 x10E6/uL (ref 3.77–5.28)
RDW: 15.4 % (ref 11.7–15.4)
WBC: 5.2 10*3/uL (ref 3.4–10.8)

## 2023-09-17 LAB — IRON,TIBC AND FERRITIN PANEL
Ferritin: 373 ng/mL — ABNORMAL HIGH (ref 15–150)
Iron Saturation: 31 % (ref 15–55)
Iron: 63 ug/dL (ref 27–139)
Total Iron Binding Capacity: 206 ug/dL — ABNORMAL LOW (ref 250–450)
UIBC: 143 ug/dL (ref 118–369)

## 2023-09-21 NOTE — Telephone Encounter (Signed)
No ans, vm full. See result note as well.

## 2023-09-22 ENCOUNTER — Encounter: Payer: Self-pay | Admitting: *Deleted

## 2023-09-22 NOTE — Telephone Encounter (Signed)
Noted  

## 2023-09-22 NOTE — Telephone Encounter (Signed)
Tried several times to reach pt. Mailed a letter.  

## 2023-10-26 ENCOUNTER — Encounter: Payer: Self-pay | Admitting: Gastroenterology

## 2023-11-04 ENCOUNTER — Telehealth: Payer: Self-pay | Admitting: Gastroenterology

## 2023-11-04 NOTE — Telephone Encounter (Signed)
Patient left a message to schedule an appt.  I called her back and there was no answer and voice mail was full

## 2023-11-06 ENCOUNTER — Telehealth: Payer: Self-pay | Admitting: Gastroenterology

## 2023-11-06 NOTE — Telephone Encounter (Signed)
Patient left a message that she wanted to schedule an appt.  I called back but no answer and voice mail is full

## 2023-11-18 ENCOUNTER — Other Ambulatory Visit: Payer: Self-pay | Admitting: Gastroenterology

## 2023-11-18 DIAGNOSIS — K279 Peptic ulcer, site unspecified, unspecified as acute or chronic, without hemorrhage or perforation: Secondary | ICD-10-CM

## 2023-11-29 NOTE — Progress Notes (Deleted)
 Referring Provider: Oneal Grout, FNP Primary Care Physician:  Oneal Grout, FNP Primary GI Physician: Dr. Marletta Lor  No chief complaint on file.   HPI:   [name] [name] is a 89 y.o. female presenting today with a history of   Past Medical History:  Diagnosis Date   Ankle edema    Arthritis    right hip   Bronchitis    in Dec   CAD (coronary artery disease)    a. s/p DES to LAD in 2008, NST in 2015 and 2017 which were low-risk   Carotid stenosis    mild   Cataract    left;not ready to come off   Constipation    takes OTC stool softener as needed   Diabetes mellitus    pt doesn't take any meds for it   DJD (degenerative joint disease) of hip    right   Dry cough    GERD (gastroesophageal reflux disease)    Headache(784.0)    occasionally   HTN (hypertension)    takes Amlodpine and Carvedilol daily   Hx of cardiovascular stress test    Lexiscan Myoview (04/2014):  No ischemia, EF 64%, Low Risk   Hypercholesteremia    takes Crestor daily   Hypothyroidism    takes Synthroid daily   Ileus, postoperative (HCC) 05/12/2014   Myocardial infarction [facility])    pt unsure but thinks about 31yrs ago;is taking Plavix daily    Nocturia    Osteoarthritis of left hip 05/09/2014   Primary osteoarthritis of right hip 10/27/2011    Past Surgical History:  Procedure Laterality Date   ABDOMINAL HYSTERECTOMY  20+yrs   BACK SURGERY  15+yrs ago   CARDIAC CATHETERIZATION  2008   CORONARY ARTERY BYPASS GRAFT     LYMPH NODE DISSECTION  20+yrs ago   right    TOTAL HIP ARTHROPLASTY  10/27/2011   Procedure: TOTAL HIP ARTHROPLASTY;  Surgeon: Eulas Post, MD;  Location: [facility] OR;  Service: Orthopedics;  Laterality: Right;   TOTAL HIP ARTHROPLASTY Left 05/09/2014   Procedure: LEFT TOTAL HIP ARTHROPLASTY;  Surgeon: Eulas Post, MD;  Location: [facility] OR;  Service: Orthopedics;  Laterality: Left;    Current Outpatient Medications  Medication Sig Dispense Refill   acetaminophen (TYLENOL) 500 MG  tablet Take 1,000 mg by mouth every 6 (six) hours as needed for mild pain.     amLODipine (NORVASC) 10 MG tablet Take 1 tablet (10 mg total) by mouth daily. 90 tablet 3   apixaban (ELIQUIS) 5 MG TABS tablet Take 2 tablets (10 mg total) by mouth 2 (two) times daily for 6 days, THEN 1 tablet (5 mg total) 2 (two) times daily. 60 tablet    cyclobenzaprine (FLEXERIL) 5 MG tablet Take 5 mg by mouth 2 (two) times daily as needed for muscle spasms.     diclofenac Sodium (VOLTAREN) 1 % GEL Apply 2 g topically daily as needed for pain.     fluticasone (FLONASE) 50 MCG/ACT nasal spray Place 1 spray into both nostrils daily. (Patient taking differently: Place 1 spray into both nostrils daily as needed for allergies or rhinitis.) 16 g 2   gabapentin (NEURONTIN) 100 MG capsule Take 100 mg by mouth 2 (two) times daily.     levothyroxine (SYNTHROID) 75 MCG tablet Take 75 mcg by mouth daily.     nitroGLYCERIN (NITROSTAT) 0.4 MG SL tablet Place 1 tablet (0.4 mg total) under the tongue every 5 (five) minutes as needed for  chest pain. 25 tablet 3   pantoprazole (PROTONIX) 40 MG tablet TAKE ONE TABLET BY MOUTH TWICE DAILY 60 tablet 1   potassium chloride (KLOR-CON) 20 MEQ packet Take 20 mEq by mouth daily.     rosuvastatin (CRESTOR) 40 MG tablet TAKE 1 TABLET BY MOUTH ONCE DAILY. (Patient taking differently: Take 40 mg by mouth daily.) 30 tablet 11   senna-docusate (SENOKOT-S) 8.6-50 MG tablet Take 1 tablet by mouth daily.     sucralfate (CARAFATE) 1 g tablet Take 1 tablet (1 g total) by mouth 4 (four) times daily -  with meals and at bedtime. 240 tablet 0   No current facility-administered medications for this visit.    Allergies as of 12/02/2023   (No Known Allergies)    Family History  Problem Relation Age of Onset   Hypertension Mother    Stroke Mother    Hypertension Father    Heart attack Brother    Heart attack Sister    Hypertension Sister    Hypertension Brother    Hypertension Daughter     Hypertension Son    Hyperlipidemia Sister    Diabetes Brother    Anesthesia problems Neg Hx    Hypotension Neg Hx    Malignant hyperthermia Neg Hx    Pseudochol deficiency Neg Hx     Social History   Socioeconomic History   Marital status: Widowed    Spouse name: Not on file   Number of children: Not on file   Years of education: Not on file   Highest education level: Not on file  Occupational History   Not on file  Tobacco Use   Smoking status: Never   Smokeless tobacco: Never  Vaping Use   Vaping status: Never Used  Substance and Sexual Activity   Alcohol use: No   Drug use: No   Sexual activity: Never    Birth control/protection: Surgical  Other Topics Concern   Not on file  Social History Narrative   Not on file   Social Drivers of Health   Financial Resource Strain: Patient Unable To Answer (07/27/2023)   Received from [facility]   Overall Financial Resource Strain (CARDIA)    Difficulty of Paying Living Expenses: Patient unable to answer  Food Insecurity: Patient Unable To Answer (07/27/2023)   Received from [facility]   Hunger Vital Sign    Worried About Running Out of Food in the Last Year: Patient unable to answer    Ran Out of Food in the Last Year: Patient unable to answer  Transportation Needs: Patient Unable To Answer (07/27/2023)   Received from [facility] - Transportation    Lack of Transportation (Medical): Patient unable to answer    Lack of Transportation (Non-Medical): Patient unable to answer  Physical Activity: Patient Unable To Answer (07/27/2023)   Received from [facility]   Exercise Vital Sign    Days of Exercise per Week: Patient unable to answer    Minutes of Exercise per Session: Patient unable to answer  Stress: Patient Unable To Answer (07/27/2023)   Received from [facility] of Occupational Health - Occupational Stress Questionnaire    Feeling of Stress : Patient unable to  answer  Social Connections: Patient Unable To Answer (07/27/2023)   Received from [facility]   Social Connection and Isolation Panel [NHANES]    Frequency of Communication with Friends and Family: Patient unable to answer    Frequency of Social Gatherings  with Friends and Family: Patient unable to answer    Attends Religious Services: Patient unable to answer    Active Member of Clubs or Organizations: Patient unable to answer    Attends Banker Meetings: Patient unable to answer    Marital Status: Patient unable to answer    Review of Systems: Gen: Denies fever, chills, anorexia. Denies fatigue, weakness, weight loss.  CV: Denies chest pain, palpitations, syncope, peripheral edema, and claudication. Resp: Denies dyspnea at rest, cough, wheezing, coughing up blood, and pleurisy. GI: Denies vomiting blood, jaundice, and fecal incontinence.   Denies dysphagia or odynophagia. Derm: Denies rash, itching, dry skin Psych: Denies depression, anxiety, memory loss, confusion. No homicidal or suicidal ideation.  Heme: Denies bruising, bleeding, and enlarged lymph nodes.  Physical Exam: There were no vitals taken for this visit. General:   Alert and oriented. No distress noted. Pleasant and cooperative.  Head:  Normocephalic and atraumatic. Eyes:  Conjuctiva clear without scleral icterus. Heart:  S1, S2 present without murmurs appreciated. Lungs:  Clear to auscultation bilaterally. No wheezes, rales, or rhonchi. No distress.  Abdomen:  +BS, soft, non-tender and non-distended. No rebound or guarding. No HSM or masses noted. Msk:  Symmetrical without gross deformities. Normal posture. Extremities:  Without edema. Neurologic:  Alert and  oriented x4 Psych:  Normal mood and affect.    Assessment:     Plan:  ***   Ermalinda Memos, PA-C [facility] 12/02/2023

## 2023-12-02 ENCOUNTER — Ambulatory Visit: Payer: 59 | Admitting: Gastroenterology

## 2023-12-07 ENCOUNTER — Encounter: Payer: Self-pay | Admitting: Gastroenterology

## 2023-12-17 NOTE — Progress Notes (Unsigned)
 Referring Provider: Oneal Grout, FNP Primary Care Physician:  Oneal Grout, FNP Primary GI Physician: Dr. Marletta Lor  No chief complaint on file.   HPI:   [name] [name] is a 89 y.o. female with past medical history of cerebrovascular accident with residual left-sided weakness, HTN, HLD, pulmonary embolism on Eliquis, coronary artery disease status post PCI and CABG, grade 1 diastolic dysfunction, CKD stage III, hypothyroidism, chronic anemia, diabetes, presenting today for follow-up ***  Patient was admitted at [facility] 10/19 - 08/01/2023 with upper GI bleed. Reviewed discharge summary which states she initially presented to [facility] on 10/11 with 3 days of generalized weakness and dark stools and was found to have a creatinine of 5.5, multiple episodes of hypotension, worsening anemia requiring frequent IV fluid support and total of 4 units PRBCs. Due to no GI service over the weekend, she was transferred to [facility]. EGD completed 10/19 showing large posterior duodenal bulb ulcer with visible vessel s/p clip placement, gastric AVM with Hemoclip placement. She completed octreotide drip and was continued on PPI twice daily. GI gave okay to resume Eliquis. Hgb was 8.9 on day of discharge.   Last seen in our office with her daughter who provided most of the history on 08/31/2023.  Reported no bowel movement since hospital discharge.  However, no abdominal pain and she was passing gas.  She had started prune juice.  No other significant GI symptoms.  Denied history of NSAID use.  Recommended CBC, iron panel, pantoprazole twice daily for full 12 weeks, then decrease to once daily, Carafate 4 times daily x 8 weeks, MiraLAX 1-2 times daily, request EGD report from [facility], follow-up in 3 months.  Will discuss if surveillance EGD was indicated with Dr. Marletta Lor.  Labs completed 09/16/2023 showing hemoglobin of 11.3, ferritin elevated at  373, iron 63, iron saturation 31%.  Case reviewed with Dr. Marletta Lor who did not recommend surveillance EGD as she was doing well and hemoglobin improved.  Recommended continuing PPI twice daily.    Today:    EGD 07/25/2023: Large 3 cm kissing posterior duodenal bulb ulcers with visible vessel s/p clipping, gastric AVM s/p Hemoclip placement, Schatzki's ring.  No prior colonoscopy.  Past Medical History:  Diagnosis Date   Ankle edema    Arthritis    right hip   Bronchitis    in Dec   CAD (coronary artery disease)    a. s/p DES to LAD in 2008, NST in 2015 and 2017 which were low-risk   Carotid stenosis    mild   Cataract    left;not ready to come off   Constipation    takes OTC stool softener as needed   Diabetes mellitus    pt doesn't take any meds for it   DJD (degenerative joint disease) of hip    right   Dry cough    GERD (gastroesophageal reflux disease)    Headache(784.0)    occasionally   HTN (hypertension)    takes Amlodpine and Carvedilol daily   Hx of cardiovascular stress test    Lexiscan Myoview (04/2014):  No ischemia, EF 64%, Low Risk   Hypercholesteremia    takes Crestor daily   Hypothyroidism    takes Synthroid daily   Ileus, postoperative (HCC) 05/12/2014   Myocardial infarction [facility])    pt unsure but thinks about 100yrs ago;is taking Plavix daily    Nocturia    Osteoarthritis of left hip 05/09/2014  Primary osteoarthritis of right hip 10/27/2011    Past Surgical History:  Procedure Laterality Date   ABDOMINAL HYSTERECTOMY  20+yrs   BACK SURGERY  15+yrs ago   CARDIAC CATHETERIZATION  2008   CORONARY ARTERY BYPASS GRAFT     LYMPH NODE DISSECTION  20+yrs ago   right    TOTAL HIP ARTHROPLASTY  10/27/2011   Procedure: TOTAL HIP ARTHROPLASTY;  Surgeon: Eulas Post, MD;  Location: [facility] OR;  Service: Orthopedics;  Laterality: Right;   TOTAL HIP ARTHROPLASTY Left 05/09/2014   Procedure: LEFT TOTAL HIP ARTHROPLASTY;  Surgeon: Eulas Post, MD;  Location:  [facility] OR;  Service: Orthopedics;  Laterality: Left;    Current Outpatient Medications  Medication Sig Dispense Refill   acetaminophen (TYLENOL) 500 MG tablet Take 1,000 mg by mouth every 6 (six) hours as needed for mild pain.     amLODipine (NORVASC) 10 MG tablet Take 1 tablet (10 mg total) by mouth daily. 90 tablet 3   apixaban (ELIQUIS) 5 MG TABS tablet Take 2 tablets (10 mg total) by mouth 2 (two) times daily for 6 days, THEN 1 tablet (5 mg total) 2 (two) times daily. 60 tablet    cyclobenzaprine (FLEXERIL) 5 MG tablet Take 5 mg by mouth 2 (two) times daily as needed for muscle spasms.     diclofenac Sodium (VOLTAREN) 1 % GEL Apply 2 g topically daily as needed for pain.     fluticasone (FLONASE) 50 MCG/ACT nasal spray Place 1 spray into both nostrils daily. (Patient taking differently: Place 1 spray into both nostrils daily as needed for allergies or rhinitis.) 16 g 2   gabapentin (NEURONTIN) 100 MG capsule Take 100 mg by mouth 2 (two) times daily.     levothyroxine (SYNTHROID) 75 MCG tablet Take 75 mcg by mouth daily.     nitroGLYCERIN (NITROSTAT) 0.4 MG SL tablet Place 1 tablet (0.4 mg total) under the tongue every 5 (five) minutes as needed for chest pain. 25 tablet 3   pantoprazole (PROTONIX) 40 MG tablet TAKE ONE TABLET BY MOUTH TWICE DAILY 60 tablet 1   potassium chloride (KLOR-CON) 20 MEQ packet Take 20 mEq by mouth daily.     rosuvastatin (CRESTOR) 40 MG tablet TAKE 1 TABLET BY MOUTH ONCE DAILY. (Patient taking differently: Take 40 mg by mouth daily.) 30 tablet 11   senna-docusate (SENOKOT-S) 8.6-50 MG tablet Take 1 tablet by mouth daily.     sucralfate (CARAFATE) 1 g tablet Take 1 tablet (1 g total) by mouth 4 (four) times daily -  with meals and at bedtime. 240 tablet 0   No current facility-administered medications for this visit.    Allergies as of 12/18/2023   (No Known Allergies)    Family History  Problem Relation Age of Onset   Hypertension Mother    Stroke Mother     Hypertension Father    Heart attack Brother    Heart attack Sister    Hypertension Sister    Hypertension Brother    Hypertension Daughter    Hypertension Son    Hyperlipidemia Sister    Diabetes Brother    Anesthesia problems Neg Hx    Hypotension Neg Hx    Malignant hyperthermia Neg Hx    Pseudochol deficiency Neg Hx     Social History   Socioeconomic History   Marital status: Widowed    Spouse name: Not on file   Number of children: Not on file   Years of education: Not on file  Highest education level: Not on file  Occupational History   Not on file  Tobacco Use   Smoking status: Never   Smokeless tobacco: Never  Vaping Use   Vaping status: Never Used  Substance and Sexual Activity   Alcohol use: No   Drug use: No   Sexual activity: Never    Birth control/protection: Surgical  Other Topics Concern   Not on file  Social History Narrative   Not on file   Social Drivers of Health   Financial Resource Strain: Patient Unable To Answer (07/27/2023)   Received from [facility]   Overall Financial Resource Strain (CARDIA)    Difficulty of Paying Living Expenses: Patient unable to answer  Food Insecurity: Patient Unable To Answer (07/27/2023)   Received from [facility]   Hunger Vital Sign    Worried About Running Out of Food in the Last Year: Patient unable to answer    Ran Out of Food in the Last Year: Patient unable to answer  Transportation Needs: Patient Unable To Answer (07/27/2023)   Received from [facility] - Transportation    Lack of Transportation (Medical): Patient unable to answer    Lack of Transportation (Non-Medical): Patient unable to answer  Physical Activity: Patient Unable To Answer (07/27/2023)   Received from [facility]   Exercise Vital Sign    Days of Exercise per Week: Patient unable to answer    Minutes of Exercise per Session: Patient unable to answer  Stress: Patient Unable To Answer (07/27/2023)    Received from [facility] of Occupational Health - Occupational Stress Questionnaire    Feeling of Stress : Patient unable to answer  Social Connections: Patient Unable To Answer (07/27/2023)   Received from [facility]   Social Connection and Isolation Panel [NHANES]    Frequency of Communication with Friends and Family: Patient unable to answer    Frequency of Social Gatherings with Friends and Family: Patient unable to answer    Attends Religious Services: Patient unable to answer    Active Member of Clubs or Organizations: Patient unable to answer    Attends Banker Meetings: Patient unable to answer    Marital Status: Patient unable to answer    Review of Systems: Gen: Denies fever, chills, anorexia. Denies fatigue, weakness, weight loss.  CV: Denies chest pain, palpitations, syncope, peripheral edema, and claudication. Resp: Denies dyspnea at rest, cough, wheezing, coughing up blood, and pleurisy. GI: Denies vomiting blood, jaundice, and fecal incontinence.   Denies dysphagia or odynophagia. Derm: Denies rash, itching, dry skin Psych: Denies depression, anxiety, memory loss, confusion. No homicidal or suicidal ideation.  Heme: Denies bruising, bleeding, and enlarged lymph nodes.  Physical Exam: There were no vitals taken for this visit. General:   Alert and oriented. No distress noted. Pleasant and cooperative.  Head:  Normocephalic and atraumatic. Eyes:  Conjuctiva clear without scleral icterus. Heart:  S1, S2 present without murmurs appreciated. Lungs:  Clear to auscultation bilaterally. No wheezes, rales, or rhonchi. No distress.  Abdomen:  +BS, soft, non-tender and non-distended. No rebound or guarding. No HSM or masses noted. Msk:  Symmetrical without gross deformities. Normal posture. Extremities:  Without edema. Neurologic:  Alert and  oriented x4 Psych:  Normal mood and affect.    Assessment:     Plan:  ***   Ermalinda Memos, PA-C [facility] 12/18/2023

## 2023-12-18 ENCOUNTER — Ambulatory Visit: Admitting: Gastroenterology

## 2023-12-18 ENCOUNTER — Encounter: Payer: Self-pay | Admitting: Gastroenterology

## 2023-12-18 VITALS — BP 127/71 | HR 59 | Temp 97.5°F | Ht 60.0 in

## 2023-12-18 DIAGNOSIS — Z8711 Personal history of peptic ulcer disease: Secondary | ICD-10-CM | POA: Diagnosis not present

## 2023-12-18 DIAGNOSIS — Z862 Personal history of diseases of the blood and blood-forming organs and certain disorders involving the immune mechanism: Secondary | ICD-10-CM

## 2023-12-18 DIAGNOSIS — K5900 Constipation, unspecified: Secondary | ICD-10-CM

## 2023-12-18 NOTE — Patient Instructions (Signed)
 Continue pantoprazole 40 mg twice daily.  Avoid NSAID products including ibuprofen, Aleve, Advil, BC powders, Goody powders, anything that says "NSAID" on the package.  For constipation, start MiraLAX 1 capful (17 g) daily in 8 ounces of water or other noncarbonated beverage of your choice.  To help with weight maintenance, continue Ensure daily.  As this can get expensive if you are trying to drink more than 1/day, you can try purchasing protein powder and making smoothies or milkshakes to drink 1-2 times a day as well.  I will plan to see back in 6 months or sooner if needed.  It was great to see you today!  Ermalinda Memos, PA-C [facility]

## 2024-01-16 ENCOUNTER — Other Ambulatory Visit: Payer: Self-pay | Admitting: Gastroenterology

## 2024-01-16 DIAGNOSIS — K279 Peptic ulcer, site unspecified, unspecified as acute or chronic, without hemorrhage or perforation: Secondary | ICD-10-CM

## 2024-05-26 ENCOUNTER — Encounter: Payer: Self-pay | Admitting: Gastroenterology
